# Patient Record
Sex: Male | Born: 1950
Health system: Southern US, Community
[De-identification: ages and names within clinical notes are randomized; demographics above are authoritative.]

## PROBLEM LIST (undated history)

## (undated) ENCOUNTER — Emergency Department (HOSPITAL_COMMUNITY): Payer: PPO | Source: Home / Self Care

## (undated) DIAGNOSIS — R0602 Shortness of breath: Secondary | ICD-10-CM

## (undated) DIAGNOSIS — E119 Type 2 diabetes mellitus without complications: Secondary | ICD-10-CM

## (undated) DIAGNOSIS — J189 Pneumonia, unspecified organism: Secondary | ICD-10-CM

## (undated) DIAGNOSIS — J45909 Unspecified asthma, uncomplicated: Secondary | ICD-10-CM

## (undated) DIAGNOSIS — G4730 Sleep apnea, unspecified: Secondary | ICD-10-CM

## (undated) DIAGNOSIS — M1990 Unspecified osteoarthritis, unspecified site: Secondary | ICD-10-CM

## (undated) DIAGNOSIS — R1310 Dysphagia, unspecified: Secondary | ICD-10-CM

## (undated) DIAGNOSIS — K621 Rectal polyp: Secondary | ICD-10-CM

## (undated) DIAGNOSIS — I10 Essential (primary) hypertension: Secondary | ICD-10-CM

## (undated) DIAGNOSIS — I5189 Other ill-defined heart diseases: Secondary | ICD-10-CM

## (undated) DIAGNOSIS — I2510 Atherosclerotic heart disease of native coronary artery without angina pectoris: Secondary | ICD-10-CM

## (undated) DIAGNOSIS — N400 Enlarged prostate without lower urinary tract symptoms: Secondary | ICD-10-CM

## (undated) DIAGNOSIS — I519 Heart disease, unspecified: Secondary | ICD-10-CM

## (undated) HISTORY — DX: Rectal polyp: K62.1

## (undated) HISTORY — PX: CIRCUMCISION: SUR203

## (undated) HISTORY — PX: LACERATION REPAIR: SHX5168

## (undated) HISTORY — DX: Dysphagia, unspecified: R13.10

## (undated) HISTORY — DX: Atherosclerotic heart disease of native coronary artery without angina pectoris: I25.10

---

## 2001-04-16 ENCOUNTER — Encounter: Payer: Self-pay | Admitting: Internal Medicine

## 2001-04-16 ENCOUNTER — Ambulatory Visit (HOSPITAL_COMMUNITY): Admission: RE | Admit: 2001-04-16 | Discharge: 2001-04-16 | Payer: Self-pay | Admitting: Internal Medicine

## 2001-05-04 ENCOUNTER — Encounter: Payer: Self-pay | Admitting: Internal Medicine

## 2001-05-04 ENCOUNTER — Ambulatory Visit (HOSPITAL_COMMUNITY): Admission: RE | Admit: 2001-05-04 | Discharge: 2001-05-04 | Payer: Self-pay | Admitting: Internal Medicine

## 2001-12-27 ENCOUNTER — Emergency Department (HOSPITAL_COMMUNITY): Admission: EM | Admit: 2001-12-27 | Discharge: 2001-12-27 | Payer: Self-pay | Admitting: Internal Medicine

## 2001-12-27 ENCOUNTER — Encounter: Payer: Self-pay | Admitting: Internal Medicine

## 2004-08-19 ENCOUNTER — Inpatient Hospital Stay (HOSPITAL_COMMUNITY): Admission: AD | Admit: 2004-08-19 | Discharge: 2004-08-23 | Payer: Self-pay | Admitting: Internal Medicine

## 2006-03-13 ENCOUNTER — Ambulatory Visit (HOSPITAL_COMMUNITY): Admission: RE | Admit: 2006-03-13 | Discharge: 2006-03-13 | Payer: Self-pay | Admitting: Internal Medicine

## 2006-04-26 ENCOUNTER — Emergency Department (HOSPITAL_COMMUNITY): Admission: EM | Admit: 2006-04-26 | Discharge: 2006-04-26 | Payer: Self-pay | Admitting: Emergency Medicine

## 2006-05-26 ENCOUNTER — Emergency Department (HOSPITAL_COMMUNITY): Admission: EM | Admit: 2006-05-26 | Discharge: 2006-05-26 | Payer: Self-pay | Admitting: Emergency Medicine

## 2006-06-14 ENCOUNTER — Emergency Department (HOSPITAL_COMMUNITY): Admission: EM | Admit: 2006-06-14 | Discharge: 2006-06-14 | Payer: Self-pay | Admitting: Emergency Medicine

## 2006-09-18 ENCOUNTER — Ambulatory Visit (HOSPITAL_COMMUNITY): Admission: RE | Admit: 2006-09-18 | Discharge: 2006-09-18 | Payer: Self-pay | Admitting: Internal Medicine

## 2006-09-18 ENCOUNTER — Ambulatory Visit: Payer: Self-pay | Admitting: Cardiology

## 2006-09-25 ENCOUNTER — Ambulatory Visit (HOSPITAL_COMMUNITY): Admission: RE | Admit: 2006-09-25 | Discharge: 2006-09-25 | Payer: Self-pay | Admitting: Internal Medicine

## 2006-10-03 ENCOUNTER — Ambulatory Visit: Payer: Self-pay | Admitting: Cardiovascular Disease

## 2008-08-24 ENCOUNTER — Emergency Department (HOSPITAL_COMMUNITY): Admission: EM | Admit: 2008-08-24 | Discharge: 2008-08-24 | Payer: Self-pay | Admitting: Emergency Medicine

## 2010-06-12 ENCOUNTER — Emergency Department (HOSPITAL_COMMUNITY)
Admission: EM | Admit: 2010-06-12 | Discharge: 2010-06-12 | Payer: Self-pay | Source: Home / Self Care | Admitting: Emergency Medicine

## 2010-08-27 ENCOUNTER — Ambulatory Visit (HOSPITAL_COMMUNITY)
Admission: RE | Admit: 2010-08-27 | Discharge: 2010-08-27 | Payer: Self-pay | Source: Home / Self Care | Attending: Internal Medicine | Admitting: Internal Medicine

## 2010-11-12 ENCOUNTER — Encounter: Payer: Self-pay | Admitting: Orthopedic Surgery

## 2010-12-09 ENCOUNTER — Encounter: Payer: Self-pay | Admitting: Orthopedic Surgery

## 2010-12-09 ENCOUNTER — Ambulatory Visit: Payer: Self-pay | Admitting: Orthopedic Surgery

## 2010-12-17 NOTE — Assessment & Plan Note (Signed)
[facility]                       [facility] OFFICE NOTE   NAME:Vincent Ramos                     MRN:          [id number]  DATE:10/03/2006                            DOB:          1950/10/27    Mr. Wages is referred by Dr. Felecia Shelling for an abnormal EKG, dyspnea and  hypertension.   The patient has apparently previously been seen by [facility].   His coronary risk factors include type 2 diabetes, hypertension,  apparent hyperlipidemia, untreated.   The patient occasionally gets some chest tightness.  This is not anginal  sounding.  He says his chest squeezes for a second and then goes away.  It is not always exertional, it is not related to food.   The patient had been on insulin for a short while in the past.  He has  gained quite a bit of weight lately.  He is fairly sedentary.   His dyspnea sounds functional due to deconditioning.   He is a previous smoker, and has not smoked in over 20 years.   REVIEW OF SYSTEMS:  Remarkable for occasional lower extremity edema.  There has been no history of palpitations, syncope.   The patient has been on oral diabetes medicines for about 2 years.   He has no known allergies.   He is currently taking Glucophage 500 b.i.d. and Vaseretic 15/25 b.i.d.   FAMILY HISTORY:  Remarkable for mother dying of heart problems, but at  an advanced age of 58.  Father died at age 94 of unknown causes.   The patient has had previous sutures on his right wrist area at the age  of 6 from an accident.   The patient has been married twice.  He has 2 older daughters and a  young son that lives with his first wife.  He works at [employer] as a Solicitor.  He likes to shoot pool, and is otherwise  fairly sedentary.   PHYSICAL EXAMINATION:  The blood pressure is 148/88, pulse is 70 and  regular.  HEENT:  Normal.  There is no thyromegaly, no lymphadenopathy.  LUNGS:  Mild expiratory  wheezes.  There is an S1, S2, without murmur, rub, gallop, or click.  ABDOMEN:  Benign.  LOWER EXTREMITIES:  Intact pulses, no edema.  Neuro: non-focal  Skin: warm and dry   I reviewed a study from an exercise sestamibi study done at [facility] on September 10, 2004.  It was a low risk study with an EF of  51%, and diaphragmatic attentuation.   I also reviewed an echocardiogram performed September 18, 2006 here at  [facility].  He had good LV function with moderate LVH.   There were no regional wall motion abnormalities, and no significant  valvular heart disease.   The patient's electrocardiogram, to my review, was essentially normal.  There was no evidence of significant LVH on his EKG.  No evidence of  previous infarction or abnormal timing intervals.   IMPRESSION:  A 60 year old.  EKG reviewed and not abnormal.  Echocardiogram confirming the lack of any significant hypertrophic  cardiomyopathy  or regional wall motion abnormality.   The patient clearly has to adjust his lifestyle.  He is very sedentary.  He needs to increase his activity levels.  He will be re-referred to the  dietician here at [facility].  He needs to lose weight again or  I suspect he will be back on insulin.   At some point in the future, it may be worthwhile to add Norvasc or an  angiotensin receptor blocker to his current blood pressure regimen.  Of  more concern, also, is that he has got a classic body habitus for sleep  apnea.  He does snore loudly at night.  I will leave this up to Dr.  Felecia Shelling, but I suspect the patient should be referred for the minimum of  overnight oximetry to document desaturations, and/or formal sleep study.  This may make his daytime blood pressure harder to control.   From a cardiac perspective, he should probably have a followup stress  test in February of 2009.  He is, essentially, an asymptomatic diabetic,  and I would do a Myoview at least  every 3 years on him.  He has just had  an echocardiogram, and, frankly, I do not think he needs any further  cardiac workup at this time.  We had a fairly lengthy discussion about  the need for dietary changes for the long term sequela.   The patient is to follow up with Dr. Felecia Shelling in regards to following his  hemoglobin A1C, and also for his blood pressure checks.  Again, I  suspect he will need medicine such as Norvasc or an angiotensin receptor  blocker started, in addition to his current medications.     Noralyn Pick. Eden Emms, MD, [facility]  Electronically Signed    PCN/MedQ  DD: 10/03/2006  DT: 10/03/2006  Job #: [id number]   cc:   Tesfaye D. Felecia Shelling, MD

## 2010-12-17 NOTE — H&P (Signed)
NAME:  Vincent Ramos, Vincent Ramos              ACCOUNT NO.:  [id number]   MEDICAL RECORD NO.:  [id number]          PATIENT TYPE:  INP   LOCATION:  A220                          FACILITY:  [facility]   PHYSICIAN:  Tesfaye D. Felecia Shelling, MD   DATE OF BIRTH:  24-Mar-1951   DATE OF ADMISSION:  08/19/2004  DATE OF DISCHARGE:  LH                                HISTORY & PHYSICAL   CHIEF COMPLAINT:  1.  Generalized weakness.  2.  Polyuria.  3.  Polydipsia.  4.  Nocturia.  5.  Weight loss.   HISTORY OF PRESENT ILLNESS:  This is a 60 year old male patient with a  history of hypertension.  He came to the office with the above complaints.  The patient has lost over 20 pounds in the last two to three weeks.  He has  also developed polyuria, nocturia and polydipsia.  The patient has  associated generalized weakness.  He was evaluated in the office and his  blood sugar registered greater than 500.  The patient has had no history of  diabetes mellitus in the past.  He was sent for admission.  On his way to  admission, the patient also experienced chest pain in the left side of the  chest which lasted for a few minutes.  The patient had a similar chest pain  about a week back.  There was no nausea, vomiting or diaphoresis.   REVIEW OF SYSTEMS:  The patient has no fever, chills, cough, shortness of  breath, palpitations, abdominal pain, nausea, vomiting or diarrhea.  No  dysuria, urgency or frequency of urination.   PAST MEDICAL HISTORY:  1.  Hypertension.  2.  Obesity.   CURRENT MEDICATIONS:  Lisinopril 20 mg p.o. daily.   SOCIAL HISTORY:  The patient is married.  He is full-time employed.  No  history of alcohol or tobacco.  He drinks alcohol on a social basis.   FAMILY HISTORY:  The patient's sister is a diabetic.  His father has died.  The cause of his death is not known.  His mother is alive and she has no  diabetes.   PHYSICAL EXAMINATION:  GENERAL APPEARANCE:  The patient is alert, awake and  comfortable.  VITAL SIGNS:  Blood pressure 136/70, pulse 81, respiratory rate 20,  temperature 98.4.  WEIGHT:  228 pounds.  HEENT:  Pupils are equal and reactive.  NECK:  Supple.  CHEST:  Fair air entry.  Clear lung fields.  CARDIOVASCULAR:  First and second heart sounds heard.  No murmur.  No  gallop.  ABDOMEN:  Obese, soft and lax.  Bowel sounds are positive.  No mass.  No  organomegaly.  EXTREMITIES:  No leg edema.   LABORATORIES:  Currently pending.   ASSESSMENT:  1.  New onset diabetes mellitus.  2.  Chest pain, rule out myocardial ischemia.  3.  Hypertension.  4.  Obesity.   PLAN:  1.  Will start the patient on Accu-Chek and sliding scale coverage.  2.  Would continue on IV fluids.  3.  Will do serial EKGs and cardiac enzymes.  4.  Will  put the patient on prophylactic Lovenox 40 mg subcutaneously q.24h.  5.  Will do cardiology consult.  6.  Will do diabetic education and continue him on his antihypertensive      medication.      TDF/MEDQ  D:  08/19/2004  T:  08/19/2004  Job:  [id number]

## 2010-12-17 NOTE — Discharge Summary (Signed)
NAME:  Vincent Ramos, Vincent Ramos              ACCOUNT NO.:  [id number]   MEDICAL RECORD NO.:  [id number]          PATIENT TYPE:  INP   LOCATION:  A220                          FACILITY:  [facility]   PHYSICIAN:  Tesfaye D. Felecia Shelling, MD   DATE OF BIRTH:  Oct 03, 1950   DATE OF ADMISSION:  08/19/2004  DATE OF DISCHARGE:  01/23/2006LH                                 DISCHARGE SUMMARY   DISCHARGE DIAGNOSES:  1.  New onset diabetes mellitus.  2.  Noncardiac chest pain.  3.  Hypertension.  4.  Morbid obesity.   DISCHARGE MEDICATIONS:  1.  Glucophage 1000 mg p.o. b.i.d.  2.  Actos 30 mg p.o. q.d.  3.  Lantus insulin 20 units subcu q.h.s.  4.  Lisinopril 20 mg p.o. q.d.  5.  Aspirin 1 tablet daily.   DISPOSITION:  The patient was discharged to home in stable condition.   HOSPITAL COURSE:  This is a 60 year old male patient with a history of  hypertension and obesity, came into the office due to generalized weakness,  polyuria, polydipsia and polyphagia. The patient's blood sugar in the office  was found to be over 500 mg/dl. He was then directly admitted. During the  admission, the patient complained of chest tightness. He was put under  telemetry and serial EKG and cardiac enzymes were done. He was also  evaluated by cardiology. There was no sign of acute coronary ischemia. The  patient's blood sugar was controlled. He was educated about diabetes. The  patient was discharged to home in stable condition to continue his treatment  and follow-up in outpatient.      TDF/MEDQ  D:  09/16/2004  T:  09/16/2004  Job:  [id number]

## 2010-12-17 NOTE — Procedures (Signed)
Vincent Ramos, Vincent Ramos              ACCOUNT NO.:  [id number]   MEDICAL RECORD NO.:  [id number]          PATIENT TYPE:  OUT   LOCATION:  RAD                           FACILITY:  [facility]   PHYSICIAN:  Gerrit Friends. Dietrich Pates, MD, FACCDATE OF BIRTH:  05/03/1951   DATE OF PROCEDURE:  09/18/2006  DATE OF DISCHARGE:                                ECHOCARDIOGRAM   REFERRING PHYSICIAN:  Tesfaye D. Felecia Shelling, MD   CLINICAL DATA:  A 61 year old gentleman with dyspnea, hypertension and  diabetes.   M-mode:  Aorta 3.9, left atrium 3.3, septum 2.0, posterior wall 1.6, LV  diastole 4.6, LV systole 3.5.   1. Technically adequate echocardiographic study.  2. Normal left atrium, right atrium and right ventricle; mild RVH.  3. Mild dilatation of the proximal ascending aorta; mild calcification      of the wall.  4. Normal aortic valve.  5. Normal mitral valve; mild annular calcification.  6. Tricuspid valve not well seen, but appears grossly normal.  7. Normal pulmonic valve and proximal pulmonary artery.  8. Normal left ventricular size; moderate hypertrophy; normal regional      and global function.  9. IVC poorly visualized, but appears grossly normal.      Gerrit Friends. Dietrich Pates, MD, [facility]  Electronically Signed     RMR/MEDQ  D:  09/19/2006  T:  09/19/2006  Job:  [id number]

## 2011-02-16 ENCOUNTER — Other Ambulatory Visit (HOSPITAL_COMMUNITY): Payer: Self-pay | Admitting: Internal Medicine

## 2011-02-18 ENCOUNTER — Ambulatory Visit (HOSPITAL_COMMUNITY)
Admission: RE | Admit: 2011-02-18 | Discharge: 2011-02-18 | Disposition: A | Discharge status: Home / Self Care | Payer: 59 | Source: Ambulatory Visit | Attending: Internal Medicine | Admitting: Internal Medicine

## 2011-02-18 ENCOUNTER — Other Ambulatory Visit (HOSPITAL_COMMUNITY): Payer: Self-pay | Admitting: Internal Medicine

## 2011-02-18 DIAGNOSIS — N5089 Other specified disorders of the male genital organs: Secondary | ICD-10-CM | POA: Insufficient documentation

## 2011-02-18 DIAGNOSIS — N509 Disorder of male genital organs, unspecified: Secondary | ICD-10-CM | POA: Insufficient documentation

## 2011-02-18 DIAGNOSIS — N433 Hydrocele, unspecified: Secondary | ICD-10-CM | POA: Insufficient documentation

## 2011-08-02 ENCOUNTER — Emergency Department (HOSPITAL_COMMUNITY)
Admission: EM | Admit: 2011-08-02 | Discharge: 2011-08-02 | Disposition: A | Discharge status: Home / Self Care | Payer: 59 | Attending: Emergency Medicine | Admitting: Emergency Medicine

## 2011-08-02 DIAGNOSIS — Z794 Long term (current) use of insulin: Secondary | ICD-10-CM | POA: Insufficient documentation

## 2011-08-02 DIAGNOSIS — M545 Low back pain, unspecified: Secondary | ICD-10-CM | POA: Insufficient documentation

## 2011-08-02 DIAGNOSIS — M5430 Sciatica, unspecified side: Secondary | ICD-10-CM | POA: Insufficient documentation

## 2011-08-02 DIAGNOSIS — Z79899 Other long term (current) drug therapy: Secondary | ICD-10-CM | POA: Insufficient documentation

## 2011-08-02 DIAGNOSIS — E119 Type 2 diabetes mellitus without complications: Secondary | ICD-10-CM | POA: Insufficient documentation

## 2011-08-02 DIAGNOSIS — M79609 Pain in unspecified limb: Secondary | ICD-10-CM | POA: Insufficient documentation

## 2011-08-02 DIAGNOSIS — I10 Essential (primary) hypertension: Secondary | ICD-10-CM | POA: Insufficient documentation

## 2011-08-02 DIAGNOSIS — R209 Unspecified disturbances of skin sensation: Secondary | ICD-10-CM | POA: Insufficient documentation

## 2011-08-02 HISTORY — DX: Essential (primary) hypertension: I10

## 2011-08-02 MED ORDER — CYCLOBENZAPRINE HCL 10 MG PO TABS
10.0000 mg | ORAL_TABLET | Freq: Once | ORAL | Status: AC
  Administered 2011-08-02: 10 mg via ORAL
  Filled 2011-08-02: qty 1

## 2011-08-02 MED ORDER — METHOCARBAMOL 500 MG PO TABS: ORAL_TABLET | ORAL | Status: DC

## 2011-08-02 MED ORDER — HYDROMORPHONE HCL PF 1 MG/ML IJ SOLN
1.0000 mg | Freq: Once | INTRAMUSCULAR | Status: AC
  Administered 2011-08-02: 1 mg via INTRAMUSCULAR
  Filled 2011-08-02: qty 1

## 2011-08-02 MED ORDER — NAPROXEN 500 MG PO TABS: 500.0000 mg | ORAL_TABLET | Freq: Two times a day (BID) | ORAL | Status: DC

## 2011-08-02 MED ORDER — OXYCODONE-ACETAMINOPHEN 5-325 MG PO TABS: ORAL_TABLET | ORAL | Status: DC

## 2011-08-02 NOTE — ED Notes (Signed)
C/o rt hip pain that radiates to rt big toe per pt.  Ambulated to triage by self.

## 2011-08-02 NOTE — ED Provider Notes (Signed)
History     CSN: [id number]  Arrival date & time 08/02/11  1053   First MD Initiated Contact with Patient 08/02/11 1328      Chief Complaint  Patient presents with  . Leg Pain    (Consider location/radiation/quality/duration/timing/severity/associated sxs/prior treatment) HPI Comments: Patient c/o right sided low back pain for 2-3 days.  States the pain is sharp and stinging and radiates to his right leg and into the great toe.  Intermittent tingling sensation but denies numbness, weakness, or incontinence of urine or feces.  Patient is a 61 y.o. male presenting with back pain. The history is provided by the patient.  Back Pain  This is a recurrent problem. The current episode started 2 days ago. The problem occurs constantly. The problem has not changed since onset.The pain is associated with lifting heavy objects and twisting. The pain is present in the lumbar spine and sacro-iliac joint. The quality of the pain is described as shooting and aching. The pain radiates to the right thigh and right foot. The pain is moderate. The symptoms are aggravated by bending, twisting and certain positions. The pain is the same all the time. Associated symptoms include leg pain and tingling. Pertinent negatives include no fever, no numbness, no headaches, no abdominal pain, no abdominal swelling, no bowel incontinence, no perianal numbness, no bladder incontinence, no dysuria, no pelvic pain, no paresthesias, no paresis and no weakness. He has tried nothing for the symptoms. The treatment provided no relief.    Past Medical History  Diagnosis Date  . Diabetes mellitus   . Hypertension     History reviewed. No pertinent past surgical history.  No family history on file.  History  Substance Use Topics  . Smoking status: Never Smoker   . Smokeless tobacco: Not on file  . Alcohol Use: Yes      Review of Systems  Constitutional: Negative for fever.  HENT: Negative for neck pain and neck  stiffness.   Gastrointestinal: Negative for abdominal pain and bowel incontinence.  Genitourinary: Negative for bladder incontinence, dysuria, decreased urine volume, difficulty urinating and pelvic pain.  Musculoskeletal: Positive for back pain. Negative for joint swelling and gait problem.  Skin: Negative.   Neurological: Positive for tingling. Negative for weakness, numbness, headaches and paresthesias.  All other systems reviewed and are negative.    Allergies  Review of patient's allergies indicates no known allergies.  Home Medications   Current Outpatient Rx  Name Route Sig Dispense Refill  . AMLODIPINE BESYLATE 5 MG PO TABS Oral Take 5 mg by mouth daily.      . ENALAPRIL-HYDROCHLOROTHIAZIDE 10-25 MG PO TABS Oral Take 1 tablet by mouth daily.      . INSULIN GLARGINE 100 UNIT/ML [place] SOLN Subcutaneous Inject 20 Units into the skin 2 (two) times daily.      Marland Kitchen METFORMIN HCL ER (MOD) 1000 MG PO TB24 Oral Take 1,000 mg by mouth 2 (two) times daily with a meal.        BP 179/90  Pulse 87  Temp(Src) 98.7 F (37.1 C) (Oral)  Resp 20  Ht 5\' 6"  (1.676 m)  Wt 255 lb (115.667 kg)  BMI 41.16 kg/m2  SpO2 97%  Physical Exam  Nursing note and vitals reviewed. Constitutional: He is oriented to person, place, and time. He appears well-developed and well-nourished. No distress.  HENT:  Head: Normocephalic.  Neck: Normal range of motion. Neck supple.  Cardiovascular: Normal rate, regular rhythm and normal heart sounds.  Pulmonary/Chest: Effort normal and breath sounds normal.  Musculoskeletal: He exhibits tenderness. He exhibits no edema.       Lumbar back: He exhibits tenderness and pain. He exhibits normal range of motion, no bony tenderness, no swelling, no edema, no deformity, no laceration and normal pulse.       Back:  Lymphadenopathy:    He has no cervical adenopathy.  Neurological: He is alert and oriented to person, place, and time. He has normal strength. No cranial nerve  deficit or sensory deficit. Coordination normal.  Reflex Scores:      Patellar reflexes are 2+ on the right side and 2+ on the left side.      Achilles reflexes are 2+ on the right side and 2+ on the left side. Skin: Skin is warm and dry.    ED Course  Procedures (including critical care time)       MDM    ttp of the right SI joint .  No focal neuro deficits on exam.  Ambulates w/o difficulty  Patient / Family / Caregiver understand and agree with initial ED impression and plan with expectations set for ED visit.   Pt feels improved after observation and/or treatment in ED.         Severina Sykora L. Avari Nevares, [place] 08/04/11 1309

## 2011-08-05 NOTE — ED Provider Notes (Signed)
Medical screening examination/treatment/procedure(s) were performed by non-physician practitioner and as supervising physician I was immediately available for consultation/collaboration.   Geoffery Lyons, MD 08/05/11 [phone]

## 2011-08-23 ENCOUNTER — Other Ambulatory Visit (HOSPITAL_COMMUNITY): Payer: Self-pay | Admitting: Internal Medicine

## 2011-08-23 DIAGNOSIS — M549 Dorsalgia, unspecified: Secondary | ICD-10-CM

## 2011-08-26 ENCOUNTER — Ambulatory Visit (HOSPITAL_COMMUNITY): Payer: 59

## 2011-09-12 ENCOUNTER — Ambulatory Visit (HOSPITAL_COMMUNITY): Payer: 59

## 2011-09-16 ENCOUNTER — Ambulatory Visit (INDEPENDENT_AMBULATORY_CARE_PROVIDER_SITE_OTHER): Payer: 59 | Admitting: Urology

## 2011-09-16 DIAGNOSIS — N433 Hydrocele, unspecified: Secondary | ICD-10-CM

## 2011-09-19 ENCOUNTER — Other Ambulatory Visit: Payer: Self-pay | Admitting: Urology

## 2011-10-10 ENCOUNTER — Encounter (HOSPITAL_COMMUNITY): Payer: Self-pay | Admitting: Pharmacy Technician

## 2011-10-10 ENCOUNTER — Other Ambulatory Visit: Payer: Self-pay

## 2011-10-10 ENCOUNTER — Encounter (HOSPITAL_COMMUNITY)
Admission: RE | Admit: 2011-10-10 | Discharge: 2011-10-10 | Disposition: A | Discharge status: Home / Self Care | Payer: 59 | Source: Ambulatory Visit | Attending: Urology | Admitting: Urology

## 2011-10-10 ENCOUNTER — Encounter (HOSPITAL_COMMUNITY): Payer: Self-pay

## 2011-10-10 HISTORY — DX: Shortness of breath: R06.02

## 2011-10-10 LAB — BASIC METABOLIC PANEL
BUN: 14 mg/dL (ref 6–23)
Calcium: 9.8 mg/dL (ref 8.4–10.5)
GFR calc non Af Amer: 75 mL/min — ABNORMAL LOW (ref >90)
Glucose, Bld: 119 mg/dL — ABNORMAL HIGH (ref 70–99)

## 2011-10-10 NOTE — Patient Instructions (Addendum)
20 Vincent Ramos  10/10/2011   Your procedure is scheduled on:   10/14/2011  Report to [facility] at  1030 am   Call this number if you have problems the morning of surgery: [phone]   Remember:   Do not eat food:After Midnight.  May have clear liquids:until Midnight .  Clear liquids include soda, tea, black coffee, apple or grape juice, broth.  Take these medicines the morning of surgery with A SIP OF WATER: norvasc,vaseretic,robaxcin,percocet   Do not wear jewelry, make-up or nail polish.  Do not wear lotions, powders, or perfumes. You may wear deodorant.  Do not shave 48 hours prior to surgery.  Do not bring valuables to the hospital.  Contacts, dentures or bridgework may not be worn into surgery.  Leave suitcase in the car. After surgery it may be brought to your room.  For patients admitted to the hospital, checkout time is 11:00 AM the day of discharge.   Patients discharged the day of surgery will not be allowed to drive home.  Name and phone number of your driver: family  Special Instructions: CHG Shower Use Special Wash: 1/2 bottle night before surgery and 1/2 bottle morning of surgery.   Please read over the following fact sheets that you were given: Pain Booklet, MRSA Information, Surgical Site Infection Prevention, Anesthesia Post-op Instructions and Care and Recovery After Surgery Hydrocele, Adult Fluid can collect around the testicles. This fluid forms in a sac. This condition is called a hydrocele. The collected fluid causes swelling of the scrotum. Usually, it affects just one testicle. Most of the time, the condition does not cause pain. Sometimes, the hydrocele goes away on its own. Other times, surgery is needed to get rid of the fluid. CAUSES A hydrocele does not develop often. Different things can cause a hydrocele in a man, including:  Injury to the scrotum.   Infection.   X-ray of the area around the scrotum.   A tumor or cancer of the testicle.    Twisting of a testicle.   Decreased blood flow to the scrotum.  SYMPTOMS   Swelling without pain. The hydrocele feels like a water-filled balloon.   Swelling with pain. This can occur if the hydrocele was caused by infection or twisting.   Mild discomfort in the scrotum.   The hydrocele may feel heavy.   Swelling that gets smaller when you lie down.  DIAGNOSIS  Your caregiver will do a physical exam to decide if you have a hydrocele. This may include:  Asking questions about your overall health, today and in the past. Your caregiver may ask about any injuries, X-rays, or infections.   Pushing on your abdomen or asking you to change positions to see if the size of the hydrocele changes.   Shining a light through the scrotum (transillumination) to see if the fluid inside the scrotum is clear.   Blood tests and urine tests to check for infection.   Imaging studies that take pictures of the scrotum and testicles.  TREATMENT  Treatment depends in part on what caused the condition. Options include:  Watchful waiting. Your caregiver checks the hydrocele every so often.   Different surgeries to drain the fluid.   A needle may be put into the scrotum to drain fluid (needle aspiration). Fluid often returns after this type of treatment.   A cut (incision) may be made in the scrotum to remove the fluid sac (hydrocelectomy).   An incision may be made in the  groin to repair a hydrocele that has contact with abdominal fluids (communicating hydrocele).   Medicines to treat an infection (antibiotics).  HOME CARE INSTRUCTIONS  What you need to do at home may depend on the cause of the hydrocele and type of treatment. In general:  Take all medicine as directed by your caregiver. Follow the directions carefully.   Ask your caregiver if there is anything you should not do while you recover (activities, lifting, work, sex).   If you had surgery to repair a communicating hydrocele,  recovery time may vary. Ask you caregiver about your recovery time.   Avoid heavy lifting for 4 to 6 weeks.   If you had an incision on the scrotum or groin, wash it for 2 to 3 days after surgery. Do this as long as the skin is closed and there are no gaps in the wound. Wash gently, and avoid rubbing the incision.   Keep all follow-up appointments.  SEEK MEDICAL CARE IF:   Your scrotum seems to be getting larger.   The area becomes more and more uncomfortable.  SEEK IMMEDIATE MEDICAL CARE IF:  You have a fever. Document Released: 01/05/2010 Document Revised: 07/07/2011 Document Reviewed: 01/05/2010 [facility] Patient Information 2012 [place], [place].PATIENT INSTRUCTIONS POST-ANESTHESIA  IMMEDIATELY FOLLOWING SURGERY:  Do not drive or operate machinery for the first twenty four hours after surgery.  Do not make any important decisions for twenty four hours after surgery or while taking narcotic pain medications or sedatives.  If you develop intractable nausea and vomiting or a severe headache please notify your doctor immediately.  FOLLOW-UP:  Please make an appointment with your surgeon as instructed. You do not need to follow up with anesthesia unless specifically instructed to do so.  WOUND CARE INSTRUCTIONS (if applicable):  Keep a dry clean dressing on the anesthesia/puncture wound site if there is drainage.  Once the wound has quit draining you may leave it open to air.  Generally you should leave the bandage intact for twenty four hours unless there is drainage.  If the epidural site drains for more than 36-48 hours please call the anesthesia department.  QUESTIONS?:  Please feel free to call your physician or the hospital operator if you have any questions, and they will be happy to assist you.     [facility] Anesthesia Department [address] [place] [phone]

## 2011-10-10 NOTE — Progress Notes (Signed)
10/10/11 1105  OBSTRUCTIVE SLEEP APNEA  Have you ever been diagnosed with sleep apnea through a sleep study? No  Do you snore loudly (loud enough to be heard through closed doors)?  1  Do you often feel tired, fatigued, or sleepy during the daytime? 0  Has anyone observed you stop breathing during your sleep? 0  Do you have, or are you being treated for high blood pressure? 1  BMI more than 35 kg/m2? 1  Age over 59 years old? 1  Neck circumference greater than 40 cm/18 inches? 1  Gender: 1  Obstructive Sleep Apnea Score 6   Score 4 or greater  Updated health history

## 2011-10-13 NOTE — Discharge Instructions (Addendum)
Hydrocele, Adult Fluid can collect around the testicles. This fluid forms in a sac. This condition is called a hydrocele. The collected fluid causes swelling of the scrotum. Usually, it affects just one testicle. Most of the time, the condition does not cause pain. Sometimes, the hydrocele goes away on its own. Other times, surgery is needed to get rid of the fluid. CAUSES A hydrocele does not develop often. Different things can cause a hydrocele in a man, including:  Injury to the scrotum.   Infection.   X-ray of the area around the scrotum.   A tumor or cancer of the testicle.   Twisting of a testicle.   Decreased blood flow to the scrotum.  SYMPTOMS   Swelling without pain. The hydrocele feels like a water-filled balloon.   Swelling with pain. This can occur if the hydrocele was caused by infection or twisting.   Mild discomfort in the scrotum.   The hydrocele may feel heavy.   Swelling that gets smaller when you lie down.  DIAGNOSIS  Your caregiver will do a physical exam to decide if you have a hydrocele. This may include:  Asking questions about your overall health, today and in the past. Your caregiver may ask about any injuries, X-rays, or infections.   Pushing on your abdomen or asking you to change positions to see if the size of the hydrocele changes.   Shining a light through the scrotum (transillumination) to see if the fluid inside the scrotum is clear.   Blood tests and urine tests to check for infection.   Imaging studies that take pictures of the scrotum and testicles.  TREATMENT  Treatment depends in part on what caused the condition. Options include:  Watchful waiting. Your caregiver checks the hydrocele every so often.   Different surgeries to drain the fluid.   A needle may be put into the scrotum to drain fluid (needle aspiration). Fluid often returns after this type of treatment.   A cut (incision) may be made in the scrotum to remove the fluid  sac (hydrocelectomy).   An incision may be made in the groin to repair a hydrocele that has contact with abdominal fluids (communicating hydrocele).   Medicines to treat an infection (antibiotics).  HOME CARE INSTRUCTIONS  What you need to do at home may depend on the cause of the hydrocele and type of treatment. In general:  Take all medicine as directed by your caregiver. Follow the directions carefully.   Ask your caregiver if there is anything you should not do while you recover (activities, lifting, work, sex).   If you had surgery to repair a communicating hydrocele, recovery time may vary. Ask you caregiver about your recovery time.   Avoid heavy lifting for 4 to 6 weeks.   If you had an incision on the scrotum or groin, wash it for 2 to 3 days after surgery. Do this as long as the skin is closed and there are no gaps in the wound. Wash gently, and avoid rubbing the incision.   Keep all follow-up appointments.  SEEK MEDICAL CARE IF:   Your scrotum seems to be getting larger.   The area becomes more and more uncomfortable.  SEEK IMMEDIATE MEDICAL CARE IF:  You have a fever. Document Released: 01/05/2010 Document Revised: 07/07/2011 Document Reviewed: 01/05/2010 [facility] Patient Information 2012 [place], [place].   Please remove the wound drain in the morning.  It should just slip out and is about 6 inches long.  If it  doesn't come easily, don't force it and let us know so we can bring you in to remove it.  PATIENT INSTRUCTIONS POST-ANESTHESIA  IMMEDIATELY FOLLOWING SURGERY:  Do not drive or operate machinery for the first twenty four hours after surgery.  Do not make any important decisions for twenty four hours after surgery or while taking narcotic pain medications or sedatives.  If you develop intractable nausea and vomiting or a severe headache please notify your doctor immediately.  FOLLOW-UP:  Please make an appointment with your surgeon as instructed. You do not need  to follow up with anesthesia unless specifically instructed to do so.  WOUND CARE INSTRUCTIONS (if applicable):  Keep a dry clean dressing on the anesthesia/puncture wound site if there is drainage.  Once the wound has quit draining you may leave it open to air.  Generally you should leave the bandage intact for twenty four hours unless there is drainage.  If the epidural site drains for more than 36-48 hours please call the anesthesia department.  QUESTIONS?:  Please feel free to call your physician or the hospital operator if you have any questions, and they will be happy to assist you.     [facility] Anesthesia Department [address] [place] [phone]

## 2011-10-13 NOTE — H&P (Signed)
Problems  1. Testicular Hydrocele Right 603.9  History of Present Illness  Vincent Ramos is a 61 yo BM sent in consult by Dr. Felecia Shelling for right scrotal swelling that has been present for over a year.  He has some pain with movement and has gotten increasely larger and is getting in the way.   He has a hard time voiding with the swelling.   He otherwise voids without difficulty.  I also makes sex difficult.   Past Medical History Problems  1. History of  Diabetes Mellitus 250.00 2. History of  Hypertension 401.9  Surgical History Problems  1. History of  Hand Surgery Left  Current Meds 1. Aspirin 81 MG Oral Tablet Chewable; Therapy: (Recorded:15Feb2013) to 2. Lisinopril-Hydrochlorothiazide TABS; Therapy: (Recorded:15Feb2013) to 3. MetFORMIN HCl TABS; Therapy: (Recorded:15Feb2013) to 4. Multiple Vitamin TABS; Therapy: (Recorded:15Feb2013) to  Allergies Medication  1. No Known Drug Allergies  Family History Problems  1. Family history of  Death In The Family Mother 2. Family history of  Family Health Status Number Of Children  Social History Problems    Alcohol Use   Caffeine Use   Marital History - Currently Married   Never A Smoker   Occupation:  Review of Systems Genitourinary, constitutional, skin, eye, otolaryngeal, hematologic/lymphatic, cardiovascular, pulmonary, endocrine, musculoskeletal, gastrointestinal, neurological and psychiatric system(s) were reviewed and pertinent findings if present are noted.  Genitourinary: scrotal swelling.    Vitals Vital Signs [Data Includes: Last 1 Day]  15Feb2013 01:47PM  Blood Pressure: 182 / 94 15Feb2013 01:43PM  BMI Calculated: 40.18 BSA Calculated: 2.2 Height: 5 ft 6 in Weight: 250 lb  Blood Pressure: 189 / 93 Temperature: 99.3 F Heart Rate: 89  Physical Exam Constitutional: Well nourished and well developed . No acute distress.  ENT:. The ears and nose are normal in appearance.  Neck: The appearance of the neck is  normal and no neck mass is present.  Pulmonary: No respiratory distress and normal respiratory rhythm and effort.  Cardiovascular: Heart rate and rhythm are normal . No peripheral edema.  Abdomen: The abdomen is obese. The abdomen is soft and nontender. No masses are palpated. No CVA tenderness. No hernias are palpable. No hepatosplenomegaly noted.  Genitourinary: Examination of the penis demonstrates no discharge, no masses, no lesions and a normal meatus. The scrotum is without lesions. Examination of the right scrotum demonstrates a hydrocele. The right epididymis is palpably normal and non-tender. The left epididymis is palpably normal and non-tender. The right testis is non-tender and without masses. The left testis is non-tender and without masses.  Lymphatics: The femoral and inguinal nodes are not enlarged or tender.  Skin: Normal skin turgor, no visible rash and no visible skin lesions.  Neuro/Psych:. Mood and affect are appropriate.    Results/Data Urine [Data Includes: Last 1 Day]   15Feb2013  COLOR YELLOW   APPEARANCE CLEAR   SPECIFIC GRAVITY 1.020   pH 6.0   GLUCOSE NEG mg/dL  BILIRUBIN NEG   KETONE NEG mg/dL  BLOOD TRACE   PROTEIN TRACE mg/dL  UROBILINOGEN 0.2 mg/dL  NITRITE NEG   LEUKOCYTE ESTERASE NEG   SQUAMOUS EPITHELIAL/HPF NONE SEEN   WBC NONE SEEN WBC/hpf  RBC 0-2 RBC/hpf  BACTERIA NONE SEEN   CRYSTALS NONE SEEN   CASTS NONE SEEN    Assessment Assessed  1. Testicular Hydrocele Right 603.9   He has a symptomatic right hydrocele and needs repair.   Plan Testicular Hydrocele (603.9)  1. Follow-up Schedule Surgery Office  Follow-up  Requested for: 15Feb2013 Unlinked  2. UA With REFLEX  Done: 15Feb2013 01:43PM   I am going to schedule him for a right hydrocelectomy.  I have reviewed the risks of bleeding, infection, recurrence, testicular injury, thrombotic events and anesthetic complications.

## 2011-10-14 ENCOUNTER — Encounter (HOSPITAL_COMMUNITY)
Admission: RE | Disposition: A | Discharge status: Home / Self Care | Payer: Self-pay | Source: Ambulatory Visit | Attending: Urology

## 2011-10-14 ENCOUNTER — Ambulatory Visit (HOSPITAL_COMMUNITY)
Admission: RE | Admit: 2011-10-14 | Discharge: 2011-10-14 | Disposition: A | Discharge status: Home / Self Care | Payer: 59 | Source: Ambulatory Visit | Attending: Urology | Admitting: Urology

## 2011-10-14 ENCOUNTER — Encounter (HOSPITAL_COMMUNITY): Payer: Self-pay | Admitting: Anesthesiology

## 2011-10-14 ENCOUNTER — Encounter (HOSPITAL_COMMUNITY): Payer: Self-pay | Admitting: *Deleted

## 2011-10-14 ENCOUNTER — Ambulatory Visit (HOSPITAL_COMMUNITY): Payer: 59 | Admitting: Anesthesiology

## 2011-10-14 DIAGNOSIS — E119 Type 2 diabetes mellitus without complications: Secondary | ICD-10-CM | POA: Insufficient documentation

## 2011-10-14 DIAGNOSIS — Z7982 Long term (current) use of aspirin: Secondary | ICD-10-CM | POA: Insufficient documentation

## 2011-10-14 DIAGNOSIS — Z01812 Encounter for preprocedural laboratory examination: Secondary | ICD-10-CM | POA: Insufficient documentation

## 2011-10-14 DIAGNOSIS — Z79899 Other long term (current) drug therapy: Secondary | ICD-10-CM | POA: Insufficient documentation

## 2011-10-14 DIAGNOSIS — I10 Essential (primary) hypertension: Secondary | ICD-10-CM | POA: Insufficient documentation

## 2011-10-14 DIAGNOSIS — N433 Hydrocele, unspecified: Secondary | ICD-10-CM | POA: Insufficient documentation

## 2011-10-14 DIAGNOSIS — Z01810 Encounter for preprocedural cardiovascular examination: Secondary | ICD-10-CM | POA: Insufficient documentation

## 2011-10-14 HISTORY — PX: HYDROCELE EXCISION: SHX482

## 2011-10-14 LAB — GLUCOSE, CAPILLARY: Glucose-Capillary: 129 mg/dL — ABNORMAL HIGH (ref 70–99)

## 2011-10-14 SURGERY — HYDROCELECTOMY
Anesthesia: Spinal | Site: Scrotum | Laterality: Right | Wound class: Clean

## 2011-10-14 MED ORDER — PROPOFOL 10 MG/ML IV EMUL
INTRAVENOUS | Status: AC
  Filled 2011-10-14: qty 20

## 2011-10-14 MED ORDER — FENTANYL CITRATE 0.05 MG/ML IJ SOLN
INTRAMUSCULAR | Status: DC | PRN
  Administered 2011-10-14: 25 ug via INTRATHECAL

## 2011-10-14 MED ORDER — EPHEDRINE SULFATE 50 MG/ML IJ SOLN
INTRAMUSCULAR | Status: AC
  Filled 2011-10-14: qty 1

## 2011-10-14 MED ORDER — HYDROCODONE-ACETAMINOPHEN 5-325 MG PO TABS: 1.0000 | ORAL_TABLET | Freq: Four times a day (QID) | ORAL | Status: AC | PRN

## 2011-10-14 MED ORDER — GLYCOPYRROLATE 0.2 MG/ML IJ SOLN
INTRAMUSCULAR | Status: AC
  Filled 2011-10-14: qty 1

## 2011-10-14 MED ORDER — BUPIVACAINE IN DEXTROSE 0.75-8.25 % IT SOLN
INTRATHECAL | Status: DC | PRN
  Administered 2011-10-14: 13 mg via INTRATHECAL

## 2011-10-14 MED ORDER — ONDANSETRON HCL 4 MG/2ML IJ SOLN: 4.0000 mg | Freq: Once | INTRAMUSCULAR | Status: DC | PRN

## 2011-10-14 MED ORDER — FENTANYL CITRATE 0.05 MG/ML IJ SOLN
INTRAMUSCULAR | Status: AC
  Filled 2011-10-14: qty 2

## 2011-10-14 MED ORDER — CEFAZOLIN SODIUM 1-5 GM-% IV SOLN
INTRAVENOUS | Status: AC
  Filled 2011-10-14: qty 50

## 2011-10-14 MED ORDER — LACTATED RINGERS IV SOLN
INTRAVENOUS | Status: DC
  Administered 2011-10-14: 12:00:00 via INTRAVENOUS

## 2011-10-14 MED ORDER — PROPOFOL 10 MG/ML IV EMUL
INTRAVENOUS | Status: DC | PRN
  Administered 2011-10-14: 30 ug/kg/min via INTRAVENOUS

## 2011-10-14 MED ORDER — ONDANSETRON HCL 4 MG/2ML IJ SOLN
INTRAMUSCULAR | Status: AC
  Filled 2011-10-14: qty 2

## 2011-10-14 MED ORDER — SODIUM CHLORIDE 0.9 % IR SOLN
Status: DC | PRN
  Administered 2011-10-14: 1000 mL

## 2011-10-14 MED ORDER — CEFAZOLIN SODIUM 1-5 GM-% IV SOLN: 1.0000 g | INTRAVENOUS | Status: DC

## 2011-10-14 MED ORDER — EPHEDRINE SULFATE 50 MG/ML IJ SOLN
INTRAMUSCULAR | Status: DC | PRN
  Administered 2011-10-14: 15 mg via INTRAVENOUS
  Administered 2011-10-14: 10 mg via INTRAVENOUS

## 2011-10-14 MED ORDER — FENTANYL CITRATE 0.05 MG/ML IJ SOLN: 25.0000 ug | INTRAMUSCULAR | Status: DC | PRN

## 2011-10-14 MED ORDER — BUPIVACAINE HCL (PF) 0.25 % IJ SOLN
INTRAMUSCULAR | Status: DC | PRN
  Administered 2011-10-14: 9 mL

## 2011-10-14 MED ORDER — BUPIVACAINE HCL (PF) 0.25 % IJ SOLN
INTRAMUSCULAR | Status: AC
  Filled 2011-10-14: qty 30

## 2011-10-14 MED ORDER — ACETAMINOPHEN 325 MG PO TABS: 325.0000 mg | ORAL_TABLET | ORAL | Status: DC | PRN

## 2011-10-14 MED ORDER — ONDANSETRON HCL 4 MG/2ML IJ SOLN
4.0000 mg | Freq: Once | INTRAMUSCULAR | Status: AC
  Administered 2011-10-14: 4 mg via INTRAVENOUS

## 2011-10-14 MED ORDER — GLYCOPYRROLATE 0.2 MG/ML IJ SOLN
0.2000 mg | Freq: Once | INTRAMUSCULAR | Status: AC
  Administered 2011-10-14: 0.2 mg via INTRAVENOUS

## 2011-10-14 MED ORDER — FENTANYL CITRATE 0.05 MG/ML IJ SOLN
INTRAMUSCULAR | Status: DC | PRN
  Administered 2011-10-14: 50 ug via INTRAVENOUS

## 2011-10-14 MED ORDER — MIDAZOLAM HCL 2 MG/2ML IJ SOLN
INTRAMUSCULAR | Status: AC
  Filled 2011-10-14: qty 2

## 2011-10-14 MED ORDER — MIDAZOLAM HCL 2 MG/2ML IJ SOLN
1.0000 mg | INTRAMUSCULAR | Status: DC | PRN
  Administered 2011-10-14: 2 mg via INTRAVENOUS

## 2011-10-14 MED ORDER — MIDAZOLAM HCL 5 MG/5ML IJ SOLN
INTRAMUSCULAR | Status: DC | PRN
  Administered 2011-10-14: 2 mg via INTRAVENOUS

## 2011-10-14 SURGICAL SUPPLY — 33 items
BANDAGE GAUZE ELAST BULKY 4 IN (GAUZE/BANDAGES/DRESSINGS) ×2 IMPLANT
CLOTH BEACON ORANGE TIMEOUT ST (SAFETY) ×2 IMPLANT
COVER SURGICAL LIGHT HANDLE (MISCELLANEOUS) ×2 IMPLANT
DRAIN PENROSE 12X.25 LTX STRL (MISCELLANEOUS) ×1 IMPLANT
ELECT REM PT RETURN 9FT ADLT (ELECTROSURGICAL) ×2
ELECTRODE REM PT RTRN 9FT ADLT (ELECTROSURGICAL) ×1 IMPLANT
GLOVE BIOGEL PI IND STRL 7.0 (GLOVE) IMPLANT
GLOVE BIOGEL PI INDICATOR 7.0 (GLOVE) ×2
GLOVE ECLIPSE 6.5 STRL STRAW (GLOVE) ×2 IMPLANT
GLOVE SS BIOGEL STRL SZ 6.5 (GLOVE) IMPLANT
GLOVE SUPERSENSE BIOGEL SZ 6.5 (GLOVE) ×1
GLOVE SURG SS PI 8.0 STRL IVOR (GLOVE) ×8 IMPLANT
GOWN STRL REIN XL XLG (GOWN DISPOSABLE) ×4 IMPLANT
INST SET MINOR GENERAL (KITS) ×2 IMPLANT
NDL HYPO 25X1 1.5 SAFETY (NEEDLE) IMPLANT
NEEDLE HYPO 22GX1.5 SAFETY (NEEDLE) IMPLANT
NEEDLE HYPO 25X1 1.5 SAFETY (NEEDLE) ×2 IMPLANT
NS IRRIG 1000ML POUR BTL (IV SOLUTION) ×2 IMPLANT
PACK MINOR (CUSTOM PROCEDURE TRAY) ×2 IMPLANT
SET BASIN LINEN APH (SET/KITS/TRAYS/PACK) ×2 IMPLANT
SOL PREP PROV IODINE SCRUB 4OZ (MISCELLANEOUS) ×2 IMPLANT
SPONGE GAUZE 4X4 12PLY (GAUZE/BANDAGES/DRESSINGS) ×1 IMPLANT
SPONGE LAP 4X18 X RAY DECT (DISPOSABLE) ×3 IMPLANT
SUPPORT SCROTAL LRG NO STRP (SOFTGOODS) IMPLANT
SUPPORT SCROTAL MEDIUM (SOFTGOODS) IMPLANT
SUPPORT SCROTAL XLRG NO STRP (MISCELLANEOUS) ×1 IMPLANT
SUT CHROMIC 3 0 SH 27 (SUTURE) ×2 IMPLANT
SUT CHROMIC 4 0 RB 1X27 (SUTURE) IMPLANT
SUT CHROMIC BN 1/2CIR 4-0 27IN (SUTURE) ×1 IMPLANT
SYR CONTROL 10ML LL (SYRINGE) ×1 IMPLANT
TOWEL OR 17X26 4PK STRL BLUE (TOWEL DISPOSABLE) ×1 IMPLANT
WATER STERILE IRR 1000ML POUR (IV SOLUTION) ×2 IMPLANT
YANKAUER SUCT 12FT TUBE ARGYLE (SUCTIONS) ×1 IMPLANT

## 2011-10-14 NOTE — Interval H&P Note (Signed)
History and Physical Interval Note:  10/14/2011 11:35 AM  Vincent Ramos  has presented today for surgery, with the diagnosis of right hydrocele  The various methods of treatment have been discussed with the patient and family. After consideration of risks, benefits and other options for treatment, the patient has consented to  Procedure(s) (LRB): HYDROCELECTOMY ADULT (Right) as a surgical intervention .  The patients' history has been reviewed, patient examined, no change in status, stable for surgery.  I have reviewed the patients' chart and labs.  Questions were answered to the patient's satisfaction.     Lyriq Finerty J

## 2011-10-14 NOTE — Brief Op Note (Signed)
10/14/2011  12:56 PM  PATIENT:  Vincent Ramos  61 y.o. male  PRE-OPERATIVE DIAGNOSIS:  right hydrocele  POST-OPERATIVE DIAGNOSIS:  same  PROCEDURE:  Procedure(s) (LRB): HYDROCELECTOMY ADULT (Right)  SURGEON:  Surgeon(s) and Role:    * Anner Crete, MD - Primary  PHYSICIAN ASSISTANT:   ASSISTANTS: none   ANESTHESIA:   spinal  EBL:  Total I/O In: 400 [I.V.:400] Out: -   BLOOD ADMINISTERED:none  DRAINS: Penrose drain in the right scrotum   LOCAL MEDICATIONS USED:  MARCAINE  0.25% and Amount: 10 ml  SPECIMEN:  No Specimen  DISPOSITION OF SPECIMEN:  N/A  COUNTS:  YES  TOURNIQUET:  * No tourniquets in log *  DICTATION: .Other Dictation: Dictation Number [id number]  PLAN OF CARE: Discharge to home after PACU  PATIENT DISPOSITION:  PACU - hemodynamically stable.   Delay start of Pharmacological VTE agent (>24hrs) due to surgical blood loss or risk of bleeding: not applicable

## 2011-10-14 NOTE — Addendum Note (Signed)
Addendum  created 10/14/11 1337 by Haelyn Forgey, MD   Modules edited:Anesthesia Attestations    

## 2011-10-14 NOTE — Anesthesia Postprocedure Evaluation (Signed)
  Anesthesia Post-op Note  Patient: Vincent Ramos  Procedure(s) Performed: Procedure(s) (LRB): HYDROCELECTOMY ADULT (Right)  Patient Location: PACU  Anesthesia Type: Spinal  Level of Consciousness: awake, alert , oriented and patient cooperative  Airway and Oxygen Therapy: Patient Spontanous Breathing  Post-op Pain: none  Post-op Assessment: Post-op Vital signs reviewed, Patient's Cardiovascular Status Stable, Respiratory Function Stable, Patent Airway and No signs of Nausea or vomiting  Post-op Vital Signs: Reviewed and stable  Complications: No apparent anesthesia complications

## 2011-10-14 NOTE — Addendum Note (Signed)
Addendum  created 10/14/11 1337 by Roselie Awkward, MD   Modules edited:Anesthesia Attestations

## 2011-10-14 NOTE — Anesthesia Preprocedure Evaluation (Addendum)
Anesthesia Evaluation  Patient identified by MRN, date of birth, ID band Patient awake    Reviewed: Allergy & Precautions, H&P , NPO status , Patient's Chart, lab work & pertinent test results  Airway Mallampati: II TM Distance: >3 FB Neck ROM: Full    Dental No notable dental hx.    Pulmonary shortness of breath,    Pulmonary exam normal       Cardiovascular hypertension, Pt. on medications Rhythm:Regular Rate:Normal     Neuro/Psych negative neurological ROS  negative psych ROS   GI/Hepatic   Endo/Other  Diabetes mellitus-, Well Controlled, Type 2, Insulin Dependent and Oral Hypoglycemic AgentsMorbid obesity  Renal/GU      Musculoskeletal negative musculoskeletal ROS (+)   Abdominal (+) + obese,   Peds  Hematology negative hematology ROS (+)   Anesthesia Other Findings   Reproductive/Obstetrics                          Anesthesia Physical Anesthesia Plan  ASA: III  Anesthesia Plan: Spinal   Post-op Pain Management:    Induction: Intravenous  Airway Management Planned: Nasal Cannula  Additional Equipment:   Intra-op Plan:   Post-operative Plan:   Informed Consent: I have reviewed the patients History and Physical, chart, labs and discussed the procedure including the risks, benefits and alternatives for the proposed anesthesia with the patient or authorized representative who has indicated his/her understanding and acceptance.     Plan Discussed with: CRNA  Anesthesia Plan Comments:         Anesthesia Quick Evaluation

## 2011-10-14 NOTE — Anesthesia Procedure Notes (Signed)
Spinal  Patient location during procedure: OR Start time: 10/14/2011 12:21 PM Staffing CRNA/Resident: Genean Adamski J Preanesthetic Checklist Completed: patient identified, site marked, surgical consent, pre-op evaluation, timeout performed, IV checked, risks and benefits discussed and monitors and equipment checked Spinal Block Patient position: right lateral decubitus Prep: Betasept Patient monitoring: heart rate, cardiac monitor, continuous pulse ox and blood pressure Approach: midline Location: L2-3 Injection technique: single-shot Needle Needle type: Spinocan  Needle gauge: 22 G Needle length: 15 cm Assessment Sensory level: T8 Additional Notes Unable to perform spinal tap with 3.5 inch Spinocan....easy tap with 5 inch needle;zero paresthesias or blood in needle;VSS through out procedure                           [id number]    06/2012

## 2011-10-14 NOTE — Transfer of Care (Signed)
Immediate Anesthesia Transfer of Care Note  Patient: Vincent Ramos  Procedure(s) Performed: Procedure(s) (LRB): HYDROCELECTOMY ADULT (Right)  Patient Location: PACU  Anesthesia Type: Spinal  Level of Consciousness: awake and patient cooperative  Airway & Oxygen Therapy: Patient Spontanous Breathing and Patient connected to face mask oxygen  Post-op Assessment: Report given to PACU RN and Post -op Vital signs reviewed and stable  Post vital signs: Reviewed and stable  Complications: No apparent anesthesia complications

## 2011-10-15 NOTE — Op Note (Signed)
NAMEPRIMITIVO, Vincent Ramos              ACCOUNT NO.:  [id number]  MEDICAL RECORD NO.:  [id number]  LOCATION:  [facility]                          FACILITY:  [facility]  PHYSICIAN:  Excell Seltzer. Annabell Howells, M.D.    DATE OF BIRTH:  1951/01/27  DATE OF PROCEDURE:  10/14/2011 DATE OF DISCHARGE:  10/14/2011                              OPERATIVE REPORT   PROCEDURE:  Right hydrocelectomy.  PREOPERATIVE DIAGNOSIS:  Right hydrocele.  POSTOPERATIVE DIAGNOSIS:  Right hydrocele.  SURGEON:  Excell Seltzer. Annabell Howells, MD  ANESTHESIA:  Spinal.  DRAINS:  A 0.25-inch Penrose.  SPECIMEN:  None.  BLOOD LOSS:  Minimal.  COMPLICATIONS:  None.  INDICATIONS:  [name] is a 61 year old African American male with a symptomatic right hydrocele, it was elected to hydrocelectomy.  FINDINGS OF PROCEDURE:  He was given an Ancef.  He was taken to the operating room where spinal was induced.  He was placed in the supine position.  His scrotum was clipped.  He was prepped with Betadine solution and draped in usual sterile fashion.  An oblique right anterior scrotal incision was made with a knife, this was carried through the dartos with the Bovie.  The testicle within tunica vaginalis was delivered from the scrotum.  The tunica vaginalis was opened and the hydrocele fluid was drained.  The sac was then imbricated behind the testicle in a water bottle fashion.  There was insufficient sac to do resection.  Once the imbrication had been completed and hemostasis was achieved, the cord was infiltrated with approximately 5 mL of 0.25% Marcaine for cord block and the dartos was infiltrated with an additional 5% for postop pain.  A 0.25-inch Penrose drain was brought through separate stab wound on the anterior portion of the right hemiscrotum and placed posterior to the testicle.  The dartos muscle was then closed using a running 3-0 chromic suture with care being taken to avoid catching the drain.  The skin was then closed with interrupted  vertical mattress 3-0 and 4-0 chromic.  At this point, dressing of 4x4s, fluff, Kerlix and scrotal support was applied.  The patient has taken to the recovery room in stable condition.  There were no complications.     Excell Seltzer. Annabell Howells, M.D.     JJW/MEDQ  D:  10/14/2011  T:  10/15/2011  Job:  [id number]  cc:   Tesfaye D. Felecia Shelling, MD Fax: [phone]

## 2011-10-17 ENCOUNTER — Encounter (HOSPITAL_COMMUNITY): Payer: Self-pay | Admitting: Urology

## 2011-10-21 ENCOUNTER — Ambulatory Visit (INDEPENDENT_AMBULATORY_CARE_PROVIDER_SITE_OTHER): Payer: 59 | Admitting: Urology

## 2011-10-21 DIAGNOSIS — N433 Hydrocele, unspecified: Secondary | ICD-10-CM

## 2011-10-28 ENCOUNTER — Emergency Department (HOSPITAL_COMMUNITY): Payer: 59

## 2011-10-28 ENCOUNTER — Encounter (HOSPITAL_COMMUNITY): Payer: Self-pay

## 2011-10-28 ENCOUNTER — Emergency Department (HOSPITAL_COMMUNITY)
Admission: EM | Admit: 2011-10-28 | Discharge: 2011-10-28 | Disposition: A | Discharge status: Home / Self Care | Payer: 59 | Attending: Emergency Medicine | Admitting: Emergency Medicine

## 2011-10-28 DIAGNOSIS — M5410 Radiculopathy, site unspecified: Secondary | ICD-10-CM

## 2011-10-28 DIAGNOSIS — IMO0002 Reserved for concepts with insufficient information to code with codable children: Secondary | ICD-10-CM | POA: Insufficient documentation

## 2011-10-28 DIAGNOSIS — M25559 Pain in unspecified hip: Secondary | ICD-10-CM | POA: Insufficient documentation

## 2011-10-28 DIAGNOSIS — M545 Low back pain, unspecified: Secondary | ICD-10-CM | POA: Insufficient documentation

## 2011-10-28 DIAGNOSIS — I10 Essential (primary) hypertension: Secondary | ICD-10-CM | POA: Insufficient documentation

## 2011-10-28 DIAGNOSIS — Z7982 Long term (current) use of aspirin: Secondary | ICD-10-CM | POA: Insufficient documentation

## 2011-10-28 DIAGNOSIS — M48061 Spinal stenosis, lumbar region without neurogenic claudication: Secondary | ICD-10-CM | POA: Insufficient documentation

## 2011-10-28 DIAGNOSIS — Z794 Long term (current) use of insulin: Secondary | ICD-10-CM | POA: Insufficient documentation

## 2011-10-28 DIAGNOSIS — E119 Type 2 diabetes mellitus without complications: Secondary | ICD-10-CM | POA: Insufficient documentation

## 2011-10-28 DIAGNOSIS — M539 Dorsopathy, unspecified: Secondary | ICD-10-CM | POA: Insufficient documentation

## 2011-10-28 DIAGNOSIS — Z79899 Other long term (current) drug therapy: Secondary | ICD-10-CM | POA: Insufficient documentation

## 2011-10-28 DIAGNOSIS — M519 Unspecified thoracic, thoracolumbar and lumbosacral intervertebral disc disorder: Secondary | ICD-10-CM

## 2011-10-28 DIAGNOSIS — R29818 Other symptoms and signs involving the nervous system: Secondary | ICD-10-CM | POA: Insufficient documentation

## 2011-10-28 DIAGNOSIS — R209 Unspecified disturbances of skin sensation: Secondary | ICD-10-CM | POA: Insufficient documentation

## 2011-10-28 DIAGNOSIS — M79609 Pain in unspecified limb: Secondary | ICD-10-CM | POA: Insufficient documentation

## 2011-10-28 MED ORDER — CYCLOBENZAPRINE HCL 10 MG PO TABS
10.0000 mg | ORAL_TABLET | Freq: Once | ORAL | Status: AC
  Administered 2011-10-28: 10 mg via ORAL
  Filled 2011-10-28: qty 1

## 2011-10-28 MED ORDER — PREDNISONE 10 MG PO TABS: ORAL_TABLET | ORAL | Status: DC

## 2011-10-28 MED ORDER — OXYCODONE-ACETAMINOPHEN 7.5-325 MG PO TABS: 1.0000 | ORAL_TABLET | ORAL | Status: AC | PRN

## 2011-10-28 MED ORDER — DEXAMETHASONE SODIUM PHOSPHATE 10 MG/ML IJ SOLN
6.0000 mg | Freq: Once | INTRAMUSCULAR | Status: AC
  Administered 2011-10-28: 17:00:00 via INTRAMUSCULAR
  Filled 2011-10-28: qty 1

## 2011-10-28 MED ORDER — LORAZEPAM 2 MG/ML IJ SOLN
1.0000 mg | Freq: Once | INTRAMUSCULAR | Status: AC
  Administered 2011-10-28: 15:00:00 via INTRAMUSCULAR
  Filled 2011-10-28: qty 1

## 2011-10-28 MED ORDER — HYDROMORPHONE HCL PF 1 MG/ML IJ SOLN
1.0000 mg | Freq: Once | INTRAMUSCULAR | Status: AC
  Administered 2011-10-28: 1 mg via INTRAMUSCULAR
  Filled 2011-10-28: qty 1

## 2011-10-28 NOTE — ED Notes (Signed)
Given cola and crackers. No distress.

## 2011-10-28 NOTE — ED Provider Notes (Signed)
History     CSN: [id number]  Arrival date & time 10/28/11  1220   First MD Initiated Contact with Patient 10/28/11 1323      Chief Complaint  Patient presents with  . Hip Pain    (Consider location/radiation/quality/duration/timing/severity/associated sxs/prior treatment) Patient is a 61 y.o. male presenting with back pain. The history is provided by the patient.  Back Pain  This is a recurrent problem. The current episode started more than 2 days ago (He had similar symptoms 3 months ago which have resolved until this week.). The problem occurs constantly. The problem has not changed since onset.The pain is associated with no known injury (He is very active with his job having to lift objects, bending and twisting, but denies having to lift heavy objects.). The pain is present in the lumbar spine. The quality of the pain is described as stabbing and shooting. The pain radiates to the right foot. The pain is at a severity of 9/10. The pain is severe. The symptoms are aggravated by bending, twisting and certain positions. The pain is the same all the time. Stiffness is present all day. Associated symptoms include numbness and paresthesias. Pertinent negatives include no chest pain, no fever, no abdominal pain, no abdominal swelling, no bowel incontinence, no perianal numbness, no bladder incontinence, no dysuria, no leg pain, no paresis, no tingling and no weakness.    Past Medical History  Diagnosis Date  . Diabetes mellitus   . Hypertension   . Shortness of breath     Past Surgical History  Procedure Date  . Laceration repair     left hand  . Circumcision   . Hydrocele excision 10/14/2011    Procedure: HYDROCELECTOMY ADULT;  Surgeon: Anner Crete, MD;  Location: [facility];  Service: Urology;  Laterality: Right;    Family History  Problem Relation Age of Onset  . Anesthesia problems Neg Hx   . Hypotension Neg Hx   . Malignant hyperthermia Neg Hx   . Pseudochol deficiency Neg Hx       History  Substance Use Topics  . Smoking status: Never Smoker   . Smokeless tobacco: Not on file  . Alcohol Use: Yes     occassional      Review of Systems  Constitutional: Negative for fever.  Cardiovascular: Negative for chest pain.  Gastrointestinal: Negative for abdominal pain and bowel incontinence.  Genitourinary: Negative for bladder incontinence and dysuria.  Musculoskeletal: Positive for back pain.  Neurological: Positive for numbness and paresthesias. Negative for tingling and weakness.    Allergies  Review of patient's allergies indicates no known allergies.  Home Medications   Current Outpatient Rx  Name Route Sig Dispense Refill  . AMLODIPINE BESYLATE 10 MG PO TABS Oral Take 10 mg by mouth daily.    . ASPIRIN EC 81 MG PO TBEC Oral Take 81 mg by mouth daily.    . ENALAPRIL-HYDROCHLOROTHIAZIDE 10-25 MG PO TABS Oral Take 1 tablet by mouth daily.      Marland Kitchen HYDROCODONE-ACETAMINOPHEN 5-325 MG PO TABS Oral Take 1 tablet by mouth every 6 (six) hours as needed. For pain    . HYDROCODONE-ACETAMINOPHEN 5-500 MG PO TABS Oral Take 1 tablet by mouth 2 (two) times daily as needed. For pain    . INSULIN GLARGINE 100 UNIT/ML [place] SOLN Subcutaneous Inject into the skin as directed. Per sliding scale    . LINAGLIPTIN 5 MG PO TABS Oral Take 5 mg by mouth daily.    Marland Kitchen METFORMIN  HCL 1000 MG PO TABS Oral Take 1,000 mg by mouth 2 (two) times daily.      BP 183/87  Pulse 83  Temp(Src) 98.3 F (36.8 C) (Oral)  Resp 22  Ht 5' 6.5" (1.689 m)  Wt 250 lb (113.399 kg)  BMI 39.75 kg/m2  SpO2 98%  Physical Exam  Nursing note and vitals reviewed. Constitutional: He is oriented to person, place, and time. He appears well-developed and well-nourished.  HENT:  Head: Normocephalic.  Eyes: Conjunctivae are normal.  Neck: Normal range of motion. Neck supple.  Cardiovascular: Regular rhythm and intact distal pulses.        Pedal pulses normal.  Pulmonary/Chest: Effort normal. He has no  wheezes.  Abdominal: Soft. Bowel sounds are normal. He exhibits no distension and no mass.  Genitourinary: Rectal exam shows anal tone normal.  Musculoskeletal: Normal range of motion. He exhibits no edema.       Lumbar back: He exhibits tenderness. He exhibits no swelling, no edema and no spasm.  Neurological: He is alert and oriented to person, place, and time. He has normal strength. He displays no atrophy and no tremor. A sensory deficit is present. No cranial nerve deficit. He exhibits normal muscle tone. Gait normal.  Reflex Scores:      Patellar reflexes are 1+ on the right side and 1+ on the left side.      Achilles reflexes are 1+ on the right side and 1+ on the left side.      No strength deficit noted in hip and knee flexor and extensor muscle groups.  Ankle flexion and extension intact.  Patient has decreased sensation to fine touch of entire anterior and lateral right lower extremity.  Skin: Skin is warm and dry.  Psychiatric: He has a normal mood and affect.    ED Course  Procedures (including critical care time)  Labs Reviewed - No data to display Mr Lumbar Spine Wo Contrast  10/28/2011  *RADIOLOGY REPORT*  Clinical Data: 61 year old male with low back pain radiating to the right lower extremity.  Numbness and tingling.  MRI LUMBAR SPINE WITHOUT CONTRAST  Technique:  Multiplanar and multiecho pulse sequences of the lumbar spine were obtained without intravenous contrast.  Comparison: Lumbar radiographs 06/12/2010.  Findings: Normal lumbar segmentation depicted on comparison. Visualized lower thoracic spinal cord is normal with conus medularis at L1-L2. Visualized abdominal viscera and paraspinal soft tissues are within normal limits.  Normal vertebral height and alignment. No marrow edema or evidence of acute osseous abnormality.  T11-T12:  Negative.  T12-L1:  Negative.  L1-L2:  Negative.  L2-L3:  Mild epidural lipomatosis.  Otherwise negative.  L3-L4:  Epidural lipomatosis.  Mild  facet hypertrophy.  Borderline to mild spinal stenosis related to these factors.  L4-L5:  Circumferential disc bulge.  Superimposed right paracentral disc protrusion with caudal migration of disc.  Underlying epidural lipomatosis and mild to moderate facet hypertrophy.  Combined there is moderate to severe spinal stenosis.  The right lateral recess is more affected (descending right L5 nerve level).  There is mild to moderate bilateral L4 foraminal stenosis as well.  L5-S1:  Right eccentric circumferential disc bulge.  Suggestion of an associated right lateral recess level annular tear on sagittal STIR (series 5 image 6). Furthermore, there is a low T1 signal mass felt to represent extruded disc extending into and through the right L5 neural foramen (series 7 image 30).  Moderate facet hypertrophy.  Overall mild spinal stenosis, mild to moderate right lateral  recess stenosis, and mild left L5 foraminal stenosis in addition to the right foraminal findings.  IMPRESSION: 1.  Low T1 signal mass within and extending out the right L5 neural foramen ( series 7 image 30).  Favor extruded disc, with evidence of associated right paracentral annular tear on sagittal STIR imaging.  The severely affects the exiting right L5 nerve. 2.  Superimposed multifactorial degenerative moderate to severe spinal stenosis at L4-L5 plus L4-L5 and L5-S1 right lateral recess stenosis.  Original Report Authenticated By: Ulla Potash III, M.D.     1. Ruptured disk   2. Spinal stenosis of lumbar region   3. Radiculopathy of leg       MDM  Significant findings on MRI including an extruded disc affecting the L5 nerve with severe spinal stenosis from L4-S1.  Results reviewed.  Spoke with Dr. Danielle Dess regarding this MRI finding.  He has recommended Decadron 6 mg IM today followed with a six-day prednisone taper along with Percocet.  Advised patient to call his office for a followup appointment with him early next week.  Patient is agreeable to  this plan.        Candis Musa, PA 10/28/11 1654  Candis Musa, PA 10/28/11 1655

## 2011-10-28 NOTE — ED Notes (Signed)
C/o rt hip pain with radiation to toes. Ambulates without difficulty.

## 2011-10-28 NOTE — ED Notes (Signed)
Patient transported to X-ray 

## 2011-10-28 NOTE — ED Notes (Signed)
Patient transported to MRI 

## 2011-10-28 NOTE — Discharge Instructions (Signed)
Herniated Disk The bones of your spinal column (vertebrae) protect your spinal cord and nerves that go into your arms and legs. The vertebrae are separated by disks that cushion the spinal column and put space between your vertebrae. This allows movement between the vertebrae, which allows you to bend, rotate, and move your body from side to side. Sometimes, the disks move out of place (herniate) or break open (rupture) from injury or strain. The most common area for a disk herniation is in the lower back (lumbar area). Sometimes herniation occurs in the neck (cervical) disks.  CAUSES  As we grow older, the strong, fibrous cords that connect the vertebrae and support and surround the disks (ligaments) start to weaken. A strain on the back may cause a break in the disk ligaments. RISK FACTORS Herniated disks occur most often in men who are aged 18 years to 35 years, usually after strenuous activity. Other risk factors include conditions present at birth (congenital) that affect the size of the lumbar spinal canal. Additionally, a narrowing of the areas where the nerves exit the spinal canal can occur as you age. SYMPTOMS  Symptoms of a herniated disk vary. You may have weakness in certain muscles. This weakness can include difficulty lifting your leg or arm, difficulty standing on your toes on one side, or difficulty squeezing tightly with one of your hands. You may have numbness. You may feel a mild tingling, dull ache, or a burning or pulsating pain. In some cases, the pain is severe enough that you are unable to move. The pain most often occurs on one side of the body. The pain often starts slowly. It may get worse:  After you sit or stand.   At night.   When you sneeze, cough, or laugh.   When you bend backwards or walk more than a few yards.  The pain, numbness, or weakness will often go away or improve a lot over a period of weeks to months. Herniated lumbar disk Symptoms of a herniated  lumbar disk may include sharp pain in one part of your leg, hip, or buttocks and numbness in other parts. You also may feel pain or numbness on the back of your calf or the top or sole of your foot. The same leg also may feel weak. Herniated cervical disk Symptoms of a herniated cervical disk may include pain when you move your neck, deep pain near or over your shoulder blade, or pain that moves to your upper arm, forearm, or fingers. DIAGNOSIS  To diagnose a herniated disk, your caregiver will perform a physical exam. Your caregiver also may perform diagnostic tests to see your disk or to test the reaction of your muscles and the function of your nerves. During the physical exam, your caregiver may ask you to:  Sit, stand, and walk. While you walk, your caregiver may ask you to try walking on your toes and then your heels.   Bend forward, backward, and sideways.   Raise your shoulders, elbow, wrist, and fingers and check your strength during these tasks.  Your caregiver will check for:  Numbness or loss of feeling.   Muscle reflexes, which may be slower or missing.   Muscle strength, which may be weaker.   Posture or the way your spine curves.  Diagnostic tests that may be done include:  A spinal X-ray exam to rule out other causes of back pain.   Magnetic resonance imaging (MRI) or computed tomography (CT) scan, which will show   if the herniated disk is pressing on your spinal canal.   Electromyography. This is sometimes used to identify the specific area of nerve involvement.  TREATMENT  Initial treatment for a herniated disk is a short period of rest with medicines for pain. Pain medicines can include nonsteroidal anti-inflammatory medicines (NSAIDs), muscle relaxants for back spasms, and (rarely) narcotic pain medicine for severe pain that does not respond to NSAID use. Bed rest is often limited to 1 or 2 days at the most because prolonged rest can delay recovery. When the herniation  involves the lower back, sitting should be avoided as much as possible because sitting increases pressure on the ruptured disk. Sometimes a soft neck collar will be prescribed for a few days to weeks to help support your neck in the case of a cervical herniation. Physical therapy is often prescribed for patients with disk disease. Physical therapists will teach you how to properly lift, dress, walk, and perform other activities. They will work on strengthening the muscles that help support your spine. In some cases, physical therapy alone is not enough to treat a herniated disk. Steroid injections along the involved nerve root may be needed to help control pain. The steroid is injected in the area of the herniated disk and helps by reducing swelling around the disk. Sometimes surgery is the best option to treat a herniated disk.  SEEK IMMEDIATE MEDICAL CARE IF:   You have numbness, tingling, weakness, or problems with the use of your arms or legs.   You have severe headaches that are not relieved with the use of medicines.   You notice a change in your bowel or bladder control.   You have increasing pain in any areas of your body.   You experience shortness of breath, dizziness, or fainting.  MAKE SURE YOU:   Understand these instructions.   Will watch your condition.   Will get help right away if you are not doing well or get worse.  Document Released: 07/15/2000 Document Revised: 07/07/2011 Document Reviewed: 02/18/2011 [facility] Patient Information 2012 [place], [place].Lumbosacral Radiculopathy Lumbosacral radiculopathy is a pinched nerve or nerves in the low back (lumbosacral area). When this happens you may have weakness in your legs and may not be able to stand on your toes. You may have pain going down into your legs. There may be difficulties with walking normally. There are many causes of this problem. Sometimes this may happen from an injury, or simply from arthritis or boney  problems. It may also be caused by other illnesses such as diabetes. If there is no improvement after treatment, further studies may be done to find the exact cause. DIAGNOSIS  X-rays may be needed if the problems become long standing. Electromyograms may be done. This study is one in which the working of nerves and muscles is studied. HOME CARE INSTRUCTIONS   Applications of ice packs may be helpful. Ice can be used in a plastic bag with a towel around it to prevent frostbite to skin. This may be used every 2 hours for 20 to 30 minutes, or as needed, while awake, or as directed by your caregiver.   Only take over-the-counter or prescription medicines for pain, discomfort, or fever as directed by your caregiver.   If physical therapy was prescribed, follow your caregiver's directions.  SEEK IMMEDIATE MEDICAL CARE IF:   You have pain not controlled with medications.   You seem to be getting worse rather than better.   You develop increasing weakness  in your legs.   You develop loss of bowel or bladder control.   You have difficulty with walking or balance, or develop clumsiness in the use of your legs.   You have a fever.  MAKE SURE YOU:   Understand these instructions.   Will watch your condition.   Will get help right away if you are not doing well or get worse.  Document Released: 07/18/2005 Document Revised: 07/07/2011 Document Reviewed: 03/07/2008 [facility] Patient Information 2012 [place], [place].

## 2011-10-28 NOTE — ED Notes (Signed)
Pain rt hip down to toes since yesterday.  Has had similar sx in past.  Says he needs to have MRI.  Has numb sensation in leg also.

## 2011-11-01 NOTE — ED Provider Notes (Signed)
Medical screening examination/treatment/procedure(s) were performed by non-physician practitioner and as supervising physician I was immediately available for consultation/collaboration.Marland Kitchen  MRI shows HNP at L5 as well as spinal stenosis from L4-S1.  Discussed with neurosurgery. Rx steroids and pain medicine. Followup with neurosurgeon.  Donnetta Hutching, MD 11/01/11 0130

## 2011-11-03 ENCOUNTER — Telehealth (HOSPITAL_COMMUNITY): Payer: Self-pay | Admitting: *Deleted

## 2011-11-03 NOTE — ED Notes (Signed)
Pt. said he was seen at [facility] ED and had an MRI. He missed his appt. yesterday here in [place]. Can't remember who he was supposed to see.  States he needs a refill of his pain medication. I told pt. this is the [facility] in [place]. We would not be able to refill his pain medication. I accessed pt.'s chart and told him he was to f/u with Dr. Danielle Dess and gave him the address and phone number to reschedule his appt. I told him he can try going back to the ED in [employer] for a refill of his pain medicine. Vassie Moselle 11/03/2011

## 2012-01-20 ENCOUNTER — Ambulatory Visit (INDEPENDENT_AMBULATORY_CARE_PROVIDER_SITE_OTHER): Payer: 59 | Admitting: Urology

## 2012-01-20 DIAGNOSIS — N433 Hydrocele, unspecified: Secondary | ICD-10-CM

## 2012-07-29 ENCOUNTER — Emergency Department (HOSPITAL_COMMUNITY)
Admission: EM | Admit: 2012-07-29 | Discharge: 2012-07-29 | Disposition: A | Discharge status: Home / Self Care | Payer: 59 | Attending: Emergency Medicine | Admitting: Emergency Medicine

## 2012-07-29 ENCOUNTER — Encounter (HOSPITAL_COMMUNITY): Payer: Self-pay

## 2012-07-29 DIAGNOSIS — Z794 Long term (current) use of insulin: Secondary | ICD-10-CM | POA: Insufficient documentation

## 2012-07-29 DIAGNOSIS — R509 Fever, unspecified: Secondary | ICD-10-CM | POA: Insufficient documentation

## 2012-07-29 DIAGNOSIS — E119 Type 2 diabetes mellitus without complications: Secondary | ICD-10-CM | POA: Insufficient documentation

## 2012-07-29 DIAGNOSIS — Z7982 Long term (current) use of aspirin: Secondary | ICD-10-CM | POA: Insufficient documentation

## 2012-07-29 DIAGNOSIS — B9789 Other viral agents as the cause of diseases classified elsewhere: Secondary | ICD-10-CM | POA: Insufficient documentation

## 2012-07-29 DIAGNOSIS — I10 Essential (primary) hypertension: Secondary | ICD-10-CM | POA: Insufficient documentation

## 2012-07-29 DIAGNOSIS — B349 Viral infection, unspecified: Secondary | ICD-10-CM

## 2012-07-29 DIAGNOSIS — Z79899 Other long term (current) drug therapy: Secondary | ICD-10-CM | POA: Insufficient documentation

## 2012-07-29 NOTE — ED Provider Notes (Signed)
History   This chart was scribed for Donnetta Hutching, MD by Leone Payor, ED Scribe. This patient was seen in room [facility] and the patient's care was started at 1803.   CSN: [id number]  Arrival date & time 07/29/12  1747   First MD Initiated Contact with Patient 07/29/12 1803      Chief Complaint  Patient presents with  . Influenza     The history is provided by the patient. No language interpreter was used.    Vincent Ramos is a 61 y.o. male who presents to the Emergency Department complaining of a new, mild cough with associated fever starting a couple days ago. Pt reports being in close contact with his son who was recently diagnosed with the flu. Pt reports taking Robitussin and using his wife's breathing machine with moderate relief.   Pt has h/o DM, HTN, SOB.  Pt denies smoking but occasional alcohol use.  Past Medical History  Diagnosis Date  . Diabetes mellitus   . Hypertension   . Shortness of breath     Past Surgical History  Procedure Date  . Laceration repair     left hand  . Circumcision   . Hydrocele excision 10/14/2011    Procedure: HYDROCELECTOMY ADULT;  Surgeon: Anner Crete, MD;  Location: [facility];  Service: Urology;  Laterality: Right;    Family History  Problem Relation Age of Onset  . Anesthesia problems Neg Hx   . Hypotension Neg Hx   . Malignant hyperthermia Neg Hx   . Pseudochol deficiency Neg Hx     History  Substance Use Topics  . Smoking status: Never Smoker   . Smokeless tobacco: Not on file  . Alcohol Use: Yes     Comment: occassional      Review of Systems A complete 10 system review of systems was obtained and all systems are negative except as noted in the HPI and PMH.    Allergies  Review of patient's allergies indicates no known allergies.  Home Medications   Current Outpatient Rx  Name  Route  Sig  Dispense  Refill  . AMLODIPINE BESYLATE 10 MG PO TABS   Oral   Take 10 mg by mouth daily.         . ASPIRIN EC 81  MG PO TBEC   Oral   Take 81 mg by mouth daily.         . ENALAPRIL-HYDROCHLOROTHIAZIDE 10-25 MG PO TABS   Oral   Take 1 tablet by mouth daily.           Marland Kitchen HYDROCODONE-ACETAMINOPHEN 5-325 MG PO TABS   Oral   Take 1 tablet by mouth every 6 (six) hours as needed. For pain         . HYDROCODONE-ACETAMINOPHEN 5-500 MG PO TABS   Oral   Take 1 tablet by mouth 2 (two) times daily as needed. For pain         . INSULIN GLARGINE 100 UNIT/ML [place] SOLN   Subcutaneous   Inject into the skin as directed. Per sliding scale         . LINAGLIPTIN 5 MG PO TABS   Oral   Take 5 mg by mouth daily.         Marland Kitchen METFORMIN HCL 1000 MG PO TABS   Oral   Take 1,000 mg by mouth 2 (two) times daily.         Marland Kitchen PREDNISONE 10 MG PO TABS  6, 5, 4, 3, 2 then 1 tablet by mouth daily for 6 days total.   21 tablet   0     BP 149/67  Pulse 135  Temp 101 F (38.3 C) (Oral)  Resp 20  Ht 5' 6.5" (1.689 m)  Wt 245 lb (111.131 kg)  BMI 38.95 kg/m2  SpO2 95%  Physical Exam  Nursing note and vitals reviewed. Constitutional: He is oriented to person, place, and time. He appears well-developed and well-nourished.       Obese.   HENT:  Head: Normocephalic and atraumatic.  Eyes: Conjunctivae normal and EOM are normal. Pupils are equal, round, and reactive to light.  Neck: Normal range of motion. Neck supple.  Cardiovascular: Normal rate, regular rhythm and normal heart sounds.   Pulmonary/Chest: Effort normal and breath sounds normal.  Abdominal: Soft. Bowel sounds are normal.  Musculoskeletal: Normal range of motion.  Neurological: He is alert and oriented to person, place, and time.  Skin: Skin is warm and dry.  Psychiatric: He has a normal mood and affect.    ED Course  Procedures (including critical care time)  DIAGNOSTIC STUDIES: Oxygen Saturation is 95% on room air, adequate by my interpretation.    COORDINATION OF CARE:  6:15PM Patient informed of current plan for treatment  and evaluation and agrees with plan at this time.       Labs Reviewed - No data to display No results found.   No diagnosis found.    MDM  History and physical consistent with viral syndrome. Patient is nontoxic.    I personally performed the services described in this documentation, which was scribed in my presence. The recorded information has been reviewed and is accurate.     Donnetta Hutching, MD 07/29/12 [phone]

## 2012-07-29 NOTE — ED Notes (Signed)
Pt reports flu-like symptoms that started today. Son was sick w/ same earlier this weekend

## 2012-07-29 NOTE — ED Notes (Signed)
Pt alert & oriented x4, stable gait. Patient given discharge instructions, paperwork & prescription(s). Patient  instructed to stop at the registration desk to finish any additional paperwork. Patient verbalized understanding. Pt left department w/ no further questions. 

## 2012-11-27 ENCOUNTER — Ambulatory Visit (HOSPITAL_COMMUNITY)
Admission: RE | Admit: 2012-11-27 | Discharge: 2012-11-27 | Disposition: A | Discharge status: Home / Self Care | Payer: 59 | Source: Ambulatory Visit | Attending: Internal Medicine | Admitting: Internal Medicine

## 2012-11-27 ENCOUNTER — Other Ambulatory Visit (HOSPITAL_COMMUNITY): Payer: Self-pay | Admitting: Internal Medicine

## 2012-11-27 DIAGNOSIS — J984 Other disorders of lung: Secondary | ICD-10-CM | POA: Insufficient documentation

## 2012-11-27 DIAGNOSIS — R0602 Shortness of breath: Secondary | ICD-10-CM

## 2012-11-28 ENCOUNTER — Ambulatory Visit (HOSPITAL_COMMUNITY)
Admission: RE | Admit: 2012-11-28 | Discharge: 2012-11-28 | Disposition: A | Discharge status: Home / Self Care | Payer: 59 | Source: Ambulatory Visit | Attending: Internal Medicine | Admitting: Internal Medicine

## 2012-11-28 DIAGNOSIS — R0602 Shortness of breath: Secondary | ICD-10-CM | POA: Insufficient documentation

## 2012-11-28 DIAGNOSIS — E119 Type 2 diabetes mellitus without complications: Secondary | ICD-10-CM | POA: Insufficient documentation

## 2012-11-28 DIAGNOSIS — I517 Cardiomegaly: Secondary | ICD-10-CM

## 2012-11-28 DIAGNOSIS — I10 Essential (primary) hypertension: Secondary | ICD-10-CM | POA: Insufficient documentation

## 2012-11-28 NOTE — Progress Notes (Signed)
*  PRELIMINARY RESULTS* Echocardiogram 2D Echocardiogram has been performed.  Vincent Ramos 11/28/2012, 9:39 AM

## 2013-01-04 ENCOUNTER — Other Ambulatory Visit: Payer: Self-pay | Admitting: Cardiovascular Disease

## 2013-01-04 LAB — HEMOGLOBIN A1C
Hgb A1c MFr Bld: 8.3 % — ABNORMAL HIGH (ref <5.7)
Mean Plasma Glucose: 192 mg/dL — ABNORMAL HIGH (ref <117)

## 2013-01-04 LAB — CBC WITH DIFFERENTIAL/PLATELET
Basophils Absolute: 0 10*3/uL (ref 0.0–0.1)
Basophils Relative: 1 % (ref 0–1)
Eosinophils Absolute: 0.3 10*3/uL (ref 0.0–0.7)
MCH: 26.2 pg (ref 26.0–34.0)
MCHC: 34 g/dL (ref 30.0–36.0)
Neutrophils Relative %: 48 % (ref 43–77)
Platelets: 158 10*3/uL (ref 150–400)
RBC: 5.26 MIL/uL (ref 4.22–5.81)

## 2013-01-04 LAB — COMPREHENSIVE METABOLIC PANEL
ALT: 23 U/L (ref 0–53)
Alkaline Phosphatase: 60 U/L (ref 39–117)
Sodium: 141 mEq/L (ref 135–145)
Total Bilirubin: 0.7 mg/dL (ref 0.3–1.2)
Total Protein: 6.4 g/dL (ref 6.0–8.3)

## 2013-01-04 LAB — LIPID PANEL
LDL Cholesterol: 122 mg/dL — ABNORMAL HIGH (ref 0–99)
VLDL: 26 mg/dL (ref 0–40)

## 2013-01-17 ENCOUNTER — Other Ambulatory Visit: Payer: Self-pay | Admitting: Cardiovascular Disease

## 2013-01-17 DIAGNOSIS — I10 Essential (primary) hypertension: Secondary | ICD-10-CM

## 2013-01-17 DIAGNOSIS — R0602 Shortness of breath: Secondary | ICD-10-CM

## 2013-01-22 ENCOUNTER — Encounter: Payer: Self-pay | Admitting: Cardiovascular Disease

## 2013-01-23 ENCOUNTER — Telehealth: Payer: Self-pay | Admitting: Cardiovascular Disease

## 2013-01-23 NOTE — Telephone Encounter (Signed)
LEFT MESSAGE TO CALL BACK AND SCHEDULE TESTING ORDERED BY RAW

## 2013-02-19 ENCOUNTER — Other Ambulatory Visit: Payer: Self-pay | Admitting: *Deleted

## 2013-02-19 DIAGNOSIS — I10 Essential (primary) hypertension: Secondary | ICD-10-CM

## 2013-03-07 ENCOUNTER — Telehealth (HOSPITAL_COMMUNITY): Payer: Self-pay | Admitting: Cardiovascular Disease

## 2013-03-15 ENCOUNTER — Telehealth (HOSPITAL_COMMUNITY): Payer: Self-pay | Admitting: Cardiovascular Disease

## 2013-04-05 ENCOUNTER — Encounter (HOSPITAL_COMMUNITY): Payer: Self-pay | Admitting: Cardiovascular Disease

## 2013-05-28 ENCOUNTER — Ambulatory Visit (HOSPITAL_COMMUNITY)
Admission: RE | Admit: 2013-05-28 | Discharge: 2013-05-28 | Disposition: A | Discharge status: Home / Self Care | Payer: 59 | Source: Ambulatory Visit | Attending: Cardiovascular Disease | Admitting: Cardiovascular Disease

## 2013-05-28 DIAGNOSIS — R5381 Other malaise: Secondary | ICD-10-CM | POA: Insufficient documentation

## 2013-05-28 DIAGNOSIS — R002 Palpitations: Secondary | ICD-10-CM | POA: Insufficient documentation

## 2013-05-28 DIAGNOSIS — Z87891 Personal history of nicotine dependence: Secondary | ICD-10-CM | POA: Insufficient documentation

## 2013-05-28 DIAGNOSIS — R0989 Other specified symptoms and signs involving the circulatory and respiratory systems: Secondary | ICD-10-CM

## 2013-05-28 DIAGNOSIS — I10 Essential (primary) hypertension: Secondary | ICD-10-CM | POA: Insufficient documentation

## 2013-05-28 DIAGNOSIS — Z794 Long term (current) use of insulin: Secondary | ICD-10-CM | POA: Insufficient documentation

## 2013-05-28 DIAGNOSIS — R0602 Shortness of breath: Secondary | ICD-10-CM | POA: Insufficient documentation

## 2013-05-28 DIAGNOSIS — R9439 Abnormal result of other cardiovascular function study: Secondary | ICD-10-CM | POA: Insufficient documentation

## 2013-05-28 DIAGNOSIS — E119 Type 2 diabetes mellitus without complications: Secondary | ICD-10-CM | POA: Insufficient documentation

## 2013-05-28 DIAGNOSIS — E669 Obesity, unspecified: Secondary | ICD-10-CM | POA: Insufficient documentation

## 2013-05-28 DIAGNOSIS — R0609 Other forms of dyspnea: Secondary | ICD-10-CM | POA: Insufficient documentation

## 2013-05-28 DIAGNOSIS — R079 Chest pain, unspecified: Secondary | ICD-10-CM

## 2013-05-28 MED ORDER — TECHNETIUM TC 99M SESTAMIBI GENERIC - CARDIOLITE
10.6000 | Freq: Once | INTRAVENOUS | Status: AC | PRN
  Administered 2013-05-28: 11 via INTRAVENOUS

## 2013-05-28 MED ORDER — REGADENOSON 0.4 MG/5ML IV SOLN
0.4000 mg | Freq: Once | INTRAVENOUS | Status: AC
  Administered 2013-05-28: 0.4 mg via INTRAVENOUS

## 2013-05-28 MED ORDER — TECHNETIUM TC 99M SESTAMIBI GENERIC - CARDIOLITE
30.9000 | Freq: Once | INTRAVENOUS | Status: AC | PRN
  Administered 2013-05-28: 30.9 via INTRAVENOUS

## 2013-05-28 MED ORDER — AMINOPHYLLINE 25 MG/ML IV SOLN
75.0000 mg | Freq: Once | INTRAVENOUS | Status: AC
  Administered 2013-05-28: 75 mg via INTRAVENOUS

## 2013-05-28 NOTE — Procedures (Addendum)
[facility] [address] [place] [place] [address] [phone]  Cardiology Nuclear Med Vincent Ramos is a 62 y.o. male     MRN : [id number]     DOB: 1951-02-27  Procedure Date: 05/28/2013  Nuclear Med Background Indication for Stress Test:  Evaluation for Ischemia History:  No prior history reported. Cardiac Risk Factors: History of Smoking, Hypertension, IDDM Type 2 and Obesity  Symptoms:  Chest Pain, DOE, Fatigue, Palpitations and SOB   Nuclear Pre-Procedure Caffeine/Decaff Intake:  9:00pm NPO After: 7:00am   IV Site: R Hand  IV 0.9% NS with Angio Cath:  22g  Chest Size (in):  48"  IV Started by: Emmit Pomfret, RN  Height: 5\' 7"  (1.702 m)  Cup Size: n/a  BMI:  Body mass index is 43.06 kg/(m^2). Weight:  275 lb (124.739 kg)   Tech Comments:  n/a    Nuclear Med Study 1 or 2 day study: 1 day  Stress Test Type:  Lexiscan  Order Authorizing Provider:  Susa Griffins, MD   Resting Radionuclide: Technetium 55m Sestamibi  Resting Radionuclide Dose: 10.6 mCi   Stress Radionuclide:  Technetium 38m Sestamibi  Stress Radionuclide Dose: 30.9 mCi           Stress Protocol Rest HR: 67 Stress HR: 87  Rest BP: 180/90 Stress BP: 181/78  Exercise Time (min): n/a METS: n/a          Dose of Adenosine (mg):  n/a Dose of Lexiscan: 0.4 mg  Dose of Atropine (mg): n/a Dose of Dobutamine: n/a mcg/kg/min (at max HR)  Stress Test Technologist: Ernestene Mention, CCT Nuclear Technologist: Gonzella Lex, CNMT   Rest Procedure:  Myocardial perfusion imaging was performed at rest 45 minutes following the intravenous administration of Technetium 45m Sestamibi. Stress Procedure:  The patient received IV Lexiscan 0.4 mg over 15-seconds.  Technetium 71m Sestamibi injected at 30-seconds.  Due to patient's shortness of breath and chest tightness, he was given IV Aminophylline 75 mg. Symptoms were resolved during recovery. There were no  significant changes with Lexiscan.  Quantitative spect images were obtained after a 45 minute delay.  Transient Ischemic Dilatation (Normal <1.22):  0.94 Lung/Heart Ratio (Normal <0.45):  0.29 QGS EDV:  143 ml QGS ESV:  86 ml LV Ejection Fraction: 40%      Rest ECG: NSR with non-specific ST-T wave changes and NSR-LVH  Stress ECG: No significant change from baseline ECG  QPS Raw Data Images:  Normal; no motion artifact; normal heart/lung ratio. Stress Images:  There is decreased uptake in the inferior wall. Rest Images:  There is decreased uptake in the inferior wall. Subtraction (SDS):  No evidence of ischemia. LV Wall Motion:  Moderately depressed overall LV function, EF 40%; Inferior hypokinesis.  Impression Exercise Capacity:  Lexiscan with no exercise. BP Response:  Normal blood pressure response. Clinical Symptoms:  No significant symptoms noted. ECG Impression:  No significant ECG changes with Lexiscan. Comparison with Prior Nuclear Study: No previous nuclear study performed   Overall Impression:  Low risk stress nuclear study with inferior wall scar and depressed systolic function.[name], MD  05/28/2013 1:25 PM

## 2013-06-03 ENCOUNTER — Ambulatory Visit (HOSPITAL_COMMUNITY)
Admission: RE | Admit: 2013-06-03 | Discharge: 2013-06-03 | Disposition: A | Discharge status: Home / Self Care | Payer: 59 | Source: Ambulatory Visit | Attending: Cardiovascular Disease | Admitting: Cardiovascular Disease

## 2013-06-03 DIAGNOSIS — I10 Essential (primary) hypertension: Secondary | ICD-10-CM

## 2013-06-03 NOTE — Progress Notes (Signed)
Renal Duplex Completed. [place] Basham, BS, RDMS, RVT  

## 2013-06-06 ENCOUNTER — Telehealth: Payer: Self-pay | Admitting: *Deleted

## 2013-06-06 NOTE — Telephone Encounter (Signed)
Message copied by Tobin Chad on Thu Jun 06, 2013 12:18 PM ------      Message from: Marykay Lex      Created: Wed Jun 05, 2013 12:19 AM       Normal study -- no sign of Renal Artery stenosis. So no help with figuring out source of HTN.            Marykay Lex, MD       ------

## 2013-06-06 NOTE — Telephone Encounter (Signed)
Spoke to patient. Result given . Verbalized understanding. Patient was aware Dr Alanda Amass had retired ,RN asked if patient had decided on which cardiologists to follow up with. Patient states he will go back to PCP-Dr Fanta.

## 2014-02-04 ENCOUNTER — Other Ambulatory Visit: Payer: Self-pay

## 2014-02-04 MED ORDER — POTASSIUM CHLORIDE CRYS ER 20 MEQ PO TBCR: 20.0000 meq | EXTENDED_RELEASE_TABLET | Freq: Every day | ORAL | Status: DC

## 2014-02-04 NOTE — Telephone Encounter (Signed)
Rx was sent to pharmacy electronically. Patient's last office visit - 02/19/2013 Dr Marella Chimes.

## 2014-03-18 ENCOUNTER — Telehealth: Payer: Self-pay | Admitting: *Deleted

## 2014-03-18 NOTE — Telephone Encounter (Signed)
Pt called to schedule his Colonoscopy. Please advise [phone]

## 2014-04-03 NOTE — Telephone Encounter (Signed)
Pt said he is concerned about his insurance and he will check it out and call when he is ready to schedule.

## 2014-07-31 ENCOUNTER — Emergency Department (HOSPITAL_COMMUNITY): Payer: BLUE CROSS/BLUE SHIELD

## 2014-07-31 ENCOUNTER — Encounter (HOSPITAL_COMMUNITY): Payer: Self-pay

## 2014-07-31 ENCOUNTER — Inpatient Hospital Stay (HOSPITAL_COMMUNITY)
Admission: EM | Admit: 2014-07-31 | Discharge: 2014-08-04 | DRG: 194 | Disposition: A | Discharge status: Home / Self Care | Payer: BLUE CROSS/BLUE SHIELD | Attending: Internal Medicine | Admitting: Internal Medicine

## 2014-07-31 DIAGNOSIS — I10 Essential (primary) hypertension: Secondary | ICD-10-CM | POA: Diagnosis present

## 2014-07-31 DIAGNOSIS — R079 Chest pain, unspecified: Secondary | ICD-10-CM

## 2014-07-31 DIAGNOSIS — Z794 Long term (current) use of insulin: Secondary | ICD-10-CM | POA: Diagnosis not present

## 2014-07-31 DIAGNOSIS — R0600 Dyspnea, unspecified: Secondary | ICD-10-CM | POA: Diagnosis present

## 2014-07-31 DIAGNOSIS — E119 Type 2 diabetes mellitus without complications: Secondary | ICD-10-CM | POA: Diagnosis present

## 2014-07-31 DIAGNOSIS — R0902 Hypoxemia: Secondary | ICD-10-CM | POA: Diagnosis present

## 2014-07-31 DIAGNOSIS — J189 Pneumonia, unspecified organism: Principal | ICD-10-CM

## 2014-07-31 DIAGNOSIS — E669 Obesity, unspecified: Secondary | ICD-10-CM | POA: Diagnosis present

## 2014-07-31 DIAGNOSIS — R059 Cough, unspecified: Secondary | ICD-10-CM

## 2014-07-31 DIAGNOSIS — R05 Cough: Secondary | ICD-10-CM

## 2014-07-31 DIAGNOSIS — Z6841 Body Mass Index (BMI) 40.0 and over, adult: Secondary | ICD-10-CM

## 2014-07-31 HISTORY — DX: Obesity, unspecified: E66.9

## 2014-07-31 HISTORY — DX: Pneumonia, unspecified organism: J18.9

## 2014-07-31 LAB — BASIC METABOLIC PANEL
ANION GAP: 6 (ref 5–15)
BUN: 12 mg/dL (ref 6–23)
CALCIUM: 8.7 mg/dL (ref 8.4–10.5)
CO2: 28 mmol/L (ref 19–32)
Chloride: 102 mEq/L (ref 96–112)
Creatinine, Ser: 1.38 mg/dL — ABNORMAL HIGH (ref 0.50–1.35)
GFR, EST AFRICAN AMERICAN: 62 mL/min — AB (ref >90)
GFR, EST NON AFRICAN AMERICAN: 54 mL/min — AB (ref >90)
GLUCOSE: 243 mg/dL — AB (ref 70–99)
Potassium: 3.6 mmol/L (ref 3.5–5.1)
SODIUM: 136 mmol/L (ref 135–145)

## 2014-07-31 LAB — CBC
HCT: 43.4 % (ref 39.0–52.0)
Hemoglobin: 13.5 g/dL (ref 13.0–17.0)
MCH: 25.6 pg — ABNORMAL LOW (ref 26.0–34.0)
MCHC: 31.1 g/dL (ref 30.0–36.0)
MCV: 82.4 fL (ref 78.0–100.0)
PLATELETS: 150 10*3/uL (ref 150–400)
RBC: 5.27 MIL/uL (ref 4.22–5.81)
RDW: 15.3 % (ref 11.5–15.5)
WBC: 6 10*3/uL (ref 4.0–10.5)

## 2014-07-31 LAB — I-STAT CG4 LACTIC ACID, ED: LACTIC ACID, VENOUS: 1.78 mmol/L (ref 0.5–2.2)

## 2014-07-31 LAB — LACTIC ACID, PLASMA: Lactic Acid, Venous: 1.1 mmol/L (ref 0.5–2.2)

## 2014-07-31 LAB — GLUCOSE, CAPILLARY: GLUCOSE-CAPILLARY: 248 mg/dL — AB (ref 70–99)

## 2014-07-31 LAB — TROPONIN I: Troponin I: 0.03 ng/mL (ref <0.031)

## 2014-07-31 LAB — INFLUENZA PANEL BY PCR (TYPE A & B)
H1N1FLUPCR: NOT DETECTED
Influenza A By PCR: NEGATIVE
Influenza B By PCR: NEGATIVE

## 2014-07-31 LAB — BRAIN NATRIURETIC PEPTIDE: B Natriuretic Peptide: 33 pg/mL (ref 0.0–100.0)

## 2014-07-31 MED ORDER — INSULIN ASPART 100 UNIT/ML ~~LOC~~ SOLN
0.0000 [IU] | Freq: Three times a day (TID) | SUBCUTANEOUS | Status: DC
Start: 2014-08-01 — End: 2014-08-04
  Administered 2014-08-01: 11 [IU] via SUBCUTANEOUS
  Administered 2014-08-01 (×2): 7 [IU] via SUBCUTANEOUS
  Administered 2014-08-02 – 2014-08-03 (×3): 4 [IU] via SUBCUTANEOUS

## 2014-07-31 MED ORDER — SODIUM CHLORIDE 0.9 % IV BOLUS (SEPSIS)
500.0000 mL | Freq: Once | INTRAVENOUS | Status: AC
  Administered 2014-07-31: 500 mL via INTRAVENOUS

## 2014-07-31 MED ORDER — AZITHROMYCIN 250 MG PO TABS
500.0000 mg | ORAL_TABLET | Freq: Once | ORAL | Status: AC
  Administered 2014-07-31: 500 mg via ORAL
  Filled 2014-07-31: qty 2

## 2014-07-31 MED ORDER — ENALAPRIL MALEATE 5 MG PO TABS
40.0000 mg | ORAL_TABLET | Freq: Every day | ORAL | Status: DC
  Filled 2014-07-31: qty 2
  Filled 2014-07-31: qty 8
  Filled 2014-07-31 (×3): qty 2

## 2014-07-31 MED ORDER — POTASSIUM CHLORIDE CRYS ER 20 MEQ PO TBCR
20.0000 meq | EXTENDED_RELEASE_TABLET | Freq: Every day | ORAL | Status: DC
Start: 2014-07-31 — End: 2014-08-04
  Administered 2014-07-31 – 2014-08-04 (×5): 20 meq via ORAL
  Filled 2014-07-31 (×5): qty 1

## 2014-07-31 MED ORDER — ALBUTEROL SULFATE (2.5 MG/3ML) 0.083% IN NEBU
5.0000 mg | INHALATION_SOLUTION | Freq: Once | RESPIRATORY_TRACT | Status: AC
  Administered 2014-07-31: 5 mg via RESPIRATORY_TRACT
  Filled 2014-07-31: qty 6

## 2014-07-31 MED ORDER — SODIUM CHLORIDE 0.9 % IV SOLN
INTRAVENOUS | Status: DC
  Administered 2014-07-31 – 2014-08-01 (×2): via INTRAVENOUS

## 2014-07-31 MED ORDER — AMLODIPINE BESYLATE 5 MG PO TABS
5.0000 mg | ORAL_TABLET | Freq: Every day | ORAL | Status: DC
  Filled 2014-07-31: qty 1

## 2014-07-31 MED ORDER — CANAGLIFLOZIN 100 MG PO TABS
100.0000 mg | ORAL_TABLET | Freq: Every day | ORAL | Status: DC
  Administered 2014-08-01 – 2014-08-04 (×4): 100 mg via ORAL
  Filled 2014-07-31 (×6): qty 1

## 2014-07-31 MED ORDER — DEXTROSE 5 % IV SOLN
1.0000 g | Freq: Once | INTRAVENOUS | Status: AC
  Administered 2014-07-31: 1 g via INTRAVENOUS
  Filled 2014-07-31: qty 10

## 2014-07-31 MED ORDER — CETYLPYRIDINIUM CHLORIDE 0.05 % MT LIQD
7.0000 mL | Freq: Two times a day (BID) | OROMUCOSAL | Status: DC
  Administered 2014-07-31 – 2014-08-04 (×8): 7 mL via OROMUCOSAL

## 2014-07-31 MED ORDER — METHYLPREDNISOLONE SODIUM SUCC 125 MG IJ SOLR
125.0000 mg | Freq: Once | INTRAMUSCULAR | Status: AC
  Administered 2014-07-31: 125 mg via INTRAVENOUS
  Filled 2014-07-31: qty 2

## 2014-07-31 MED ORDER — INSULIN ASPART 100 UNIT/ML ~~LOC~~ SOLN
0.0000 [IU] | Freq: Every day | SUBCUTANEOUS | Status: DC
  Administered 2014-07-31 – 2014-08-03 (×3): 2 [IU] via SUBCUTANEOUS

## 2014-07-31 MED ORDER — INFLUENZA VAC SPLIT QUAD 0.5 ML IM SUSY
0.5000 mL | PREFILLED_SYRINGE | INTRAMUSCULAR | Status: AC
  Administered 2014-08-01: 0.5 mL via INTRAMUSCULAR
  Filled 2014-07-31: qty 0.5

## 2014-07-31 MED ORDER — DEXTROSE 5 % IV SOLN
INTRAVENOUS | Status: AC
  Filled 2014-07-31: qty 10

## 2014-07-31 MED ORDER — ASPIRIN EC 81 MG PO TBEC
81.0000 mg | DELAYED_RELEASE_TABLET | Freq: Every day | ORAL | Status: DC
  Administered 2014-08-01 – 2014-08-04 (×4): 81 mg via ORAL
  Filled 2014-07-31 (×5): qty 1

## 2014-07-31 MED ORDER — INSULIN GLARGINE 100 UNIT/ML ~~LOC~~ SOLN
30.0000 [IU] | Freq: Every day | SUBCUTANEOUS | Status: DC
  Administered 2014-07-31 – 2014-08-03 (×4): 30 [IU] via SUBCUTANEOUS
  Filled 2014-07-31 (×5): qty 0.3

## 2014-07-31 MED ORDER — HYDRALAZINE HCL 25 MG PO TABS
50.0000 mg | ORAL_TABLET | Freq: Two times a day (BID) | ORAL | Status: DC
  Administered 2014-07-31 – 2014-08-04 (×8): 50 mg via ORAL
  Filled 2014-07-31 (×2): qty 1
  Filled 2014-07-31 (×2): qty 2
  Filled 2014-07-31: qty 1
  Filled 2014-07-31: qty 2
  Filled 2014-07-31 (×2): qty 1
  Filled 2014-07-31 (×2): qty 2
  Filled 2014-07-31: qty 1
  Filled 2014-07-31: qty 2
  Filled 2014-07-31: qty 1
  Filled 2014-07-31 (×2): qty 2
  Filled 2014-07-31: qty 1

## 2014-07-31 MED ORDER — DEXTROSE 5 % IV SOLN
500.0000 mg | INTRAVENOUS | Status: DC
  Administered 2014-08-01 – 2014-08-03 (×3): 500 mg via INTRAVENOUS
  Filled 2014-07-31 (×4): qty 500

## 2014-07-31 MED ORDER — PNEUMOCOCCAL VAC POLYVALENT 25 MCG/0.5ML IJ INJ
0.5000 mL | INJECTION | INTRAMUSCULAR | Status: AC
  Administered 2014-08-01: 0.5 mL via INTRAMUSCULAR
  Filled 2014-07-31: qty 0.5

## 2014-07-31 MED ORDER — ALBUTEROL SULFATE (2.5 MG/3ML) 0.083% IN NEBU
2.5000 mg | INHALATION_SOLUTION | RESPIRATORY_TRACT | Status: DC | PRN
  Administered 2014-08-02: 2.5 mg via RESPIRATORY_TRACT
  Filled 2014-07-31: qty 3

## 2014-07-31 MED ORDER — DEXTROSE 5 % IV SOLN
1.0000 g | INTRAVENOUS | Status: DC
  Administered 2014-08-01 – 2014-08-03 (×3): 1 g via INTRAVENOUS
  Filled 2014-07-31 (×4): qty 10

## 2014-07-31 MED ORDER — ENOXAPARIN SODIUM 40 MG/0.4ML ~~LOC~~ SOLN
40.0000 mg | SUBCUTANEOUS | Status: DC
  Administered 2014-07-31 – 2014-08-03 (×4): 40 mg via SUBCUTANEOUS
  Filled 2014-07-31 (×4): qty 0.4

## 2014-07-31 MED ORDER — DEXTROSE 5 % IV SOLN
INTRAVENOUS | Status: AC
  Filled 2014-07-31: qty 500

## 2014-07-31 MED ORDER — ACETAMINOPHEN 325 MG PO TABS
650.0000 mg | ORAL_TABLET | Freq: Once | ORAL | Status: AC
  Administered 2014-07-31: 650 mg via ORAL
  Filled 2014-07-31: qty 2

## 2014-07-31 NOTE — ED Provider Notes (Signed)
CSN: [id number]     Arrival date & time 07/31/14  8250 History  This chart was scribed for Shaune Pollack, MD by Jeanell Sparrow, ED Scribe. This patient was seen in room APA04/APA04 and the patient's care was started at 7:29 PM.  Chief Complaint  Patient presents with  . Shortness of Breath   Patient is a 63 y.o. male presenting with shortness of breath. The history is provided by the patient. No language interpreter was used.  Shortness of Breath Severity:  Moderate Onset quality:  Sudden Duration:  4 hours Timing:  Intermittent Progression:  Unchanged Chronicity:  New Relieved by:  None tried Worsened by:  Nothing tried Ineffective treatments:  None tried Associated symptoms: cough and fever     HPI Comments: Vincent Ramos is a 63 y.o. male who presents to the Emergency Department complaining of moderate intermittent SOB that started today. He reports that he has been sick for about a week. He states that it started off with diarrhea that is now resolved, a cough productive of green and white sputum, and SOB that started today. Pt's temperature in the ED today is 102.4. He reports that he has a hx of DM and HTN. He denies any hx of smoking or asthma.   Past Medical History  Diagnosis Date  . Diabetes mellitus   . Hypertension   . Shortness of breath    Past Surgical History  Procedure Laterality Date  . Laceration repair      left hand  . Circumcision    . Hydrocele excision  10/14/2011    Procedure: HYDROCELECTOMY ADULT;  Surgeon: Malka So, MD;  Location: [facility];  Service: Urology;  Laterality: Right;   Family History  Problem Relation Age of Onset  . Anesthesia problems Neg Hx   . Hypotension Neg Hx   . Malignant hyperthermia Neg Hx   . Pseudochol deficiency Neg Hx    History  Substance Use Topics  . Smoking status: Never Smoker   . Smokeless tobacco: Not on file  . Alcohol Use: Yes     Comment: occassional    Review of Systems  Constitutional: Positive  for fever.  Respiratory: Positive for cough and shortness of breath.   Gastrointestinal: Positive for diarrhea (resolved).  All other systems reviewed and are negative.   Allergies  Review of patient's allergies indicates no known allergies.  Home Medications   Prior to Admission medications   Medication Sig Start Date End Date Taking? Authorizing Provider  amLODipine (NORVASC) 10 MG tablet Take 5 mg by mouth daily.    Yes Historical Provider, MD  aspirin EC 81 MG tablet Take 81 mg by mouth daily.   Yes Historical Provider, MD  Canagliflozin (INVOKANA PO) Take 100 mg by mouth daily.   Yes Historical Provider, MD  enalapril (VASOTEC) 20 MG tablet Take 40 mg by mouth daily. 07/08/14  Yes Historical Provider, MD  hydrALAZINE (APRESOLINE) 50 MG tablet Take 50 mg by mouth 2 (two) times daily. 06/13/14  Yes Historical Provider, MD  insulin glargine (LANTUS) 100 UNIT/ML injection Inject 30 Units into the skin at bedtime.    Yes Historical Provider, MD  potassium chloride SA (K-DUR,KLOR-CON) 20 MEQ tablet Take 1 tablet (20 mEq total) by mouth daily. Patient needs appointment for future refills. 02/04/14  Yes Lorretta Harp, MD   BP 213/91 mmHg  Pulse 114  Temp(Src) 102.4 F (39.1 C) (Oral)  Resp 21  Ht 5\' 6"  (1.676 m)  Wt  260 lb (117.935 kg)  BMI 41.99 kg/m2  SpO2 92% Physical Exam  Constitutional: He is oriented to person, place, and time. He appears well-developed and well-nourished.  HENT:  Head: Normocephalic and atraumatic.  Right Ear: External ear normal.  Left Ear: External ear normal.  Nose: Nose normal.  Mouth/Throat: Oropharynx is clear and moist.  Eyes: Conjunctivae and EOM are normal. Pupils are equal, round, and reactive to light.  Neck: Normal range of motion. Neck supple.  Cardiovascular: Normal rate, regular rhythm, normal heart sounds and intact distal pulses.   Pulmonary/Chest: Effort normal and breath sounds normal. No respiratory distress. He has no wheezes. He  exhibits no tenderness.  Decreased bs some expiratory wheezes  Abdominal: Soft. Bowel sounds are normal. He exhibits no distension and no mass. There is no tenderness. There is no guarding.  Musculoskeletal: Normal range of motion.  Neurological: He is alert and oriented to person, place, and time. He has normal reflexes. He exhibits normal muscle tone. Coordination normal.  Skin: Skin is warm and dry.  Psychiatric: He has a normal mood and affect. His behavior is normal. Judgment and thought content normal.  Nursing note and vitals reviewed.   ED Course  Procedures (including critical care time) DIAGNOSTIC STUDIES: Oxygen Saturation is 92% on RA, hypoxic by my interpretation.    COORDINATION OF CARE: 7:33 PM- Pt advised of plan for treatment which includes radiology and labs and pt agrees.  8:29 PM- Pt reports improvement with breathing treatment. Pt's pulse ox% is 90-92 currently. Told pt that he needs to be admitted and discussed lab results with pt. Pt agrees with treatment plan.   Labs Review Labs Reviewed  BASIC METABOLIC PANEL - Abnormal; Notable for the following:    Glucose, Bld 243 (*)    Creatinine, Ser 1.38 (*)    GFR calc non Af Amer 54 (*)    GFR calc Af Amer 62 (*)    All other components within normal limits  CBC - Abnormal; Notable for the following:    MCH 25.6 (*)    All other components within normal limits  CULTURE, BLOOD (ROUTINE X 2)  CULTURE, BLOOD (ROUTINE X 2)  TROPONIN I  BRAIN NATRIURETIC PEPTIDE  LACTIC ACID, PLASMA    Imaging Review Dg Chest Port 1 View  07/31/2014   CLINICAL DATA:  Initial encounter for cough was shortness of breath and congestion for 2 days.  EXAM: PORTABLE CHEST - 1 VIEW  COMPARISON:  11/27/2012  FINDINGS: 1925 hrs stable asymmetric elevation of the right hemidiaphragm on this low volume film. Vascular congestion without edema. No focal airspace consolidation, pleural effusion, or pneumothorax. Cardiopericardial silhouette is  at upper limits of normal for size. Imaged bony structures of the thorax are intact. Telemetry leads overlie the chest.  IMPRESSION: Stable.  No acute cardiopulmonary findings.   Electronically Signed   By: Misty Stanley M.D.   On: 07/31/2014 19:39     EKG Interpretation   Date/Time:  Thursday July 31 2014 18:54:33 EST Ventricular Rate:  114 PR Interval:  167 QRS Duration: 89 QT Interval:  308 QTC Calculation: 424 R Axis:   51 Text Interpretation:  Sinus tachycardia Non-specific ST-t changes  Confirmed by Delainee Tramel MD, Andee Poles [phone]) on 07/31/2014 7:37:26 PM      MDM   Final diagnoses:  HCAP (healthcare-associated pneumonia)  Type 2 diabetes mellitus without complication  Chest pain, unspecified chest pain type    63 year old male with infectious symptoms for 1 week now  with cough and dyspnea. He presented with fever to 102.4 and tachycardia. He has defervesced here with Tylenol. His received 1 L of normal saline and her remains mildly tachycardic. Chest x-Malli Falotico does not show any definite infiltrate but this may develop with IV fluids. He is being treated clinically for suspicion of pneumonia with Rocephin and Zithromax. Plan is admission for further IV fluids and treatment.  1- cap- fever, cough, tachycardia and oxygen saturations 80-90% on room air with patient with multiple comorbidities 2 chest pain- patient has complained of some intermittent chest tightness. EKG does not show any acute ischemia and initial troponin is normal.   Shaune Pollack, MD 07/31/14 2107

## 2014-07-31 NOTE — H&P (Signed)
[facility] History and Physical  Raed Schalk Gross ZOX:[id number] DOB: [birth year] DOA: 07/31/2014  Referring physician: ER PCP: Rosita Fire, MD   Chief Complaint: Dyspnea, cough.  HPI: Vincent Ramos is a 63 y.o. male  This is a 63 year old man who has diabetes and hypertension and now presents with a week history of being unwell. Initially, he started to have diarrhea without vomiting but in the last few days he has been dyspneic at rest with wheezing and also cough productive of yellow sputum. He has been febrile at home and when he was evaluated in the emergency room, he had a temperature of 102.4. He has been feeling weak. His grandson had been sick with similar symptoms approximately 2 weeks ago. He is a nonsmoker. He has not taken influenza or pneumococcal vaccination. Evaluation in the emergency room showed him to be hypoxemic with saturations in the 70s on room air. He is now being admitted for further management.  Review of Systems:  Apart from symptoms above, all other systems negative.  Past Medical History  Diagnosis Date  . Diabetes mellitus   . Hypertension   . Shortness of breath    Past Surgical History  Procedure Laterality Date  . Laceration repair      left hand  . Circumcision    . Hydrocele excision  10/14/2011    Procedure: HYDROCELECTOMY ADULT;  Surgeon: Malka So, MD;  Location: [facility];  Service: Urology;  Laterality: Right;   Social History:  reports that he has never smoked. He does not have any smokeless tobacco history on file. He reports that he drinks alcohol. He reports that he does not use illicit drugs.  No Known Allergies  Family History  Problem Relation Age of Onset  . Anesthesia problems Neg Hx   . Hypotension Neg Hx   . Malignant hyperthermia Neg Hx   . Pseudochol deficiency Neg Hx      Prior to Admission medications   Medication Sig Start Date End Date Taking? Authorizing Provider  amLODipine (NORVASC) 10 MG tablet Take  5 mg by mouth daily.    Yes Historical Provider, MD  aspirin EC 81 MG tablet Take 81 mg by mouth daily.   Yes Historical Provider, MD  Canagliflozin (INVOKANA PO) Take 100 mg by mouth daily.   Yes Historical Provider, MD  enalapril (VASOTEC) 20 MG tablet Take 40 mg by mouth daily. 07/08/14  Yes Historical Provider, MD  hydrALAZINE (APRESOLINE) 50 MG tablet Take 50 mg by mouth 2 (two) times daily. 06/13/14  Yes Historical Provider, MD  insulin glargine (LANTUS) 100 UNIT/ML injection Inject 30 Units into the skin at bedtime.    Yes Historical Provider, MD  potassium chloride SA (K-DUR,KLOR-CON) 20 MEQ tablet Take 1 tablet (20 mEq total) by mouth daily. Patient needs appointment for future refills. 02/04/14  Yes Lorretta Harp, MD   Physical Exam: Filed Vitals:   07/31/14 2000 07/31/14 2030 07/31/14 2036 07/31/14 2100  BP: 177/83 150/71 150/71 126/97  Pulse: 102 125 112 107  Temp:   99.2 F (37.3 C)   TempSrc:   Oral   Resp: 16 18 20 21   Height:      Weight:      SpO2: 97% 89% 92% 96%    Wt Readings from Last 3 Encounters:  07/31/14 117.935 kg (260 lb)  05/28/13 124.739 kg (275 lb)  07/29/12 111.131 kg (245 lb)    General:  Appears febrile at rest. He is not clinically toxic. Currently,  he is not dyspneic at rest, this is after bronchodilators being given. Eyes: PERRL, normal lids, irises & conjunctiva ENT: grossly normal hearing, lips & tongue Neck: no LAD, masses or thyromegaly Cardiovascular: RRR, no m/r/g. No LE edema. Telemetry: Sinus tachycardia. Respiratory: CTA bilaterally, no w/r/r. Normal respiratory effort. Currently, he does not have wheezing as he had previously in the emergency room. There is no bronchial breathing or crackles. Abdomen: soft, ntnd Skin: no rash or induration seen on limited exam Musculoskeletal: grossly normal tone BUE/BLE Psychiatric: grossly normal mood and affect, speech fluent and appropriate Neurologic: grossly non-focal.          Labs on  Admission:  Basic Metabolic Panel:  Recent Labs Lab 07/31/14 1912  NA 136  K 3.6  CL 102  CO2 28  GLUCOSE 243*  BUN 12  CREATININE 1.38*  CALCIUM 8.7   Liver Function Tests: No results for input(s): AST, ALT, ALKPHOS, BILITOT, PROT, ALBUMIN in the last 168 hours. No results for input(s): LIPASE, AMYLASE in the last 168 hours. No results for input(s): AMMONIA in the last 168 hours. CBC:  Recent Labs Lab 07/31/14 1912  WBC 6.0  HGB 13.5  HCT 43.4  MCV 82.4  PLT 150   Cardiac Enzymes:  Recent Labs Lab 07/31/14 1912  TROPONINI 0.03    BNP (last 3 results) No results for input(s): PROBNP in the last 8760 hours. CBG: No results for input(s): GLUCAP in the last 168 hours.  Radiological Exams on Admission: Dg Chest Port 1 View  07/31/2014   CLINICAL DATA:  Initial encounter for cough was shortness of breath and congestion for 2 days.  EXAM: PORTABLE CHEST - 1 VIEW  COMPARISON:  11/27/2012  FINDINGS: 1925 hrs stable asymmetric elevation of the right hemidiaphragm on this low volume film. Vascular congestion without edema. No focal airspace consolidation, pleural effusion, or pneumothorax. Cardiopericardial silhouette is at upper limits of normal for size. Imaged bony structures of the thorax are intact. Telemetry leads overlie the chest.  IMPRESSION: Stable.  No acute cardiopulmonary findings.   Electronically Signed   By: Misty Stanley M.D.   On: 07/31/2014 19:39    EKG: Independently reviewed. Normal sinus rhythm without any acute ST-T wave changes.  Assessment/Plan   1. Possible community-acquired pneumonia-chest x-ray is not suggestive and white count is not elevated. However his symptoms do seem to fit with a pneumonic process or at the very least a viral respiratory process. In view of his diabetes, we will treat this as community-acquired pneumonia with antibiotics. Will check influenza panel. 2. Respiratory distress-he appears to be improved now in the emergency  room but I will prescribe bronchodilators as necessary and supplemental oxygen as necessary. 3. Diabetes-continue with home medications and sliding scale of insulin. 4. Hypertension-appears to be stabilizing and will continue with home medications.  Further recommendations will depend on patient's hospital progress.   Code Status: Full code  DVT Prophylaxis: Lovenox  Family Communication: I discussed the plan with the patient at the bedside.  Disposition Plan: Home when medically stable.  Time spent: 60 minutes  [facility] Pager [phone].

## 2014-07-31 NOTE — ED Notes (Signed)
Patient states he started with a cough and chest pain 2 days ago, with NVD. Patient states productive cough that is "white and green" patient A&OX4

## 2014-08-01 ENCOUNTER — Inpatient Hospital Stay (HOSPITAL_COMMUNITY): Payer: BLUE CROSS/BLUE SHIELD

## 2014-08-01 LAB — COMPREHENSIVE METABOLIC PANEL
ALT: 27 U/L (ref 0–53)
ANION GAP: 6 (ref 5–15)
AST: 23 U/L (ref 0–37)
Albumin: 3.6 g/dL (ref 3.5–5.2)
Alkaline Phosphatase: 73 U/L (ref 39–117)
BUN: 18 mg/dL (ref 6–23)
CALCIUM: 8.7 mg/dL (ref 8.4–10.5)
CO2: 26 mmol/L (ref 19–32)
Chloride: 110 mEq/L (ref 96–112)
Creatinine, Ser: 1.23 mg/dL (ref 0.50–1.35)
GFR calc non Af Amer: 62 mL/min — ABNORMAL LOW (ref >90)
GFR, EST AFRICAN AMERICAN: 72 mL/min — AB (ref >90)
GLUCOSE: 267 mg/dL — AB (ref 70–99)
Potassium: 4.2 mmol/L (ref 3.5–5.1)
SODIUM: 142 mmol/L (ref 135–145)
Total Bilirubin: 0.6 mg/dL (ref 0.3–1.2)
Total Protein: 7 g/dL (ref 6.0–8.3)

## 2014-08-01 LAB — STREP PNEUMONIAE URINARY ANTIGEN: STREP PNEUMO URINARY ANTIGEN: NEGATIVE

## 2014-08-01 LAB — CBC
HCT: 45 % (ref 39.0–52.0)
Hemoglobin: 13.9 g/dL (ref 13.0–17.0)
MCH: 25.7 pg — ABNORMAL LOW (ref 26.0–34.0)
MCHC: 30.9 g/dL (ref 30.0–36.0)
MCV: 83.3 fL (ref 78.0–100.0)
PLATELETS: 138 10*3/uL — AB (ref 150–400)
RBC: 5.4 MIL/uL (ref 4.22–5.81)
RDW: 15.5 % (ref 11.5–15.5)
WBC: 4.5 10*3/uL (ref 4.0–10.5)

## 2014-08-01 LAB — EXPECTORATED SPUTUM ASSESSMENT W GRAM STAIN, RFLX TO RESP C

## 2014-08-01 LAB — GLUCOSE, CAPILLARY
GLUCOSE-CAPILLARY: 212 mg/dL — AB (ref 70–99)
GLUCOSE-CAPILLARY: 228 mg/dL — AB (ref 70–99)
GLUCOSE-CAPILLARY: 293 mg/dL — AB (ref 70–99)
GLUCOSE-CAPILLARY: 219 mg/dL — AB (ref 70–99)

## 2014-08-01 LAB — EXPECTORATED SPUTUM ASSESSMENT W REFEX TO RESP CULTURE

## 2014-08-01 MED ORDER — ENALAPRIL MALEATE 5 MG PO TABS
20.0000 mg | ORAL_TABLET | Freq: Two times a day (BID) | ORAL | Status: DC
  Administered 2014-08-01 – 2014-08-04 (×7): 20 mg via ORAL
  Filled 2014-08-01 (×6): qty 4

## 2014-08-01 MED ORDER — HYDROCODONE-ACETAMINOPHEN 5-325 MG PO TABS
1.0000 | ORAL_TABLET | Freq: Four times a day (QID) | ORAL | Status: DC | PRN
  Administered 2014-08-01: 2 via ORAL
  Administered 2014-08-02: 1 via ORAL
  Administered 2014-08-02 (×2): 2 via ORAL
  Administered 2014-08-03: 1 via ORAL
  Administered 2014-08-04: 2 via ORAL
  Administered 2014-08-04: 1 via ORAL
  Filled 2014-08-01: qty 1
  Filled 2014-08-01: qty 2
  Filled 2014-08-01: qty 1
  Filled 2014-08-01: qty 2
  Filled 2014-08-01: qty 1
  Filled 2014-08-01: qty 2
  Filled 2014-08-01: qty 1
  Filled 2014-08-01: qty 2

## 2014-08-01 MED ORDER — AMLODIPINE BESYLATE 5 MG PO TABS: 10.0000 mg | ORAL_TABLET | Freq: Once | ORAL | Status: DC

## 2014-08-01 MED ORDER — AMLODIPINE BESYLATE 5 MG PO TABS
10.0000 mg | ORAL_TABLET | Freq: Every day | ORAL | Status: DC
  Administered 2014-08-01 – 2014-08-04 (×4): 10 mg via ORAL
  Filled 2014-08-01 (×4): qty 2

## 2014-08-01 NOTE — Progress Notes (Signed)
Subjective: Patient was admitted yesterday due to cough and congestion as xcae of pneumonia. He feels better today. No fever or chills.  Objective: Vital signs in last 24 hours: Temp:  [99 F (37.2 C)-102.4 F (39.1 C)] 99 F (37.2 C) (01/01 0540) Pulse Rate:  [91-125] 91 (12/31 2227) Resp:  [16-27] 20 (12/31 2227) BP: (126-213)/(56-97) 182/88 mmHg (01/01 0825) SpO2:  [89 %-99 %] 97 % (12/31 2227) FiO2 (%):  [28 %] 28 % (12/31 2133) Weight:  [117.935 kg (260 lb)] 117.935 kg (260 lb) (12/31 1857) Weight change:  Last BM Date: 08/01/14  Intake/Output from previous day: 12/31 0701 - 01/01 0700 In: 606.7 [I.V.:606.7] Out: 1700 [Urine:1700]  PHYSICAL EXAM General appearance: alert, no distress and moderately obese Resp: diminished breath sounds bilaterally and rhonchi bilaterally Cardio: regular rate and rhythm GI: soft, non-tender; bowel sounds normal; no masses,  no organomegaly Extremities: extremities normal, atraumatic, no cyanosis or edema  Lab Results:  Results for orders placed or performed during the hospital encounter of 07/31/14 (from the past 48 hour(s))  Basic metabolic panel     Status: Abnormal   Collection Time: 07/31/14  7:12 PM  Result Value Ref Range   Sodium 136 135 - 145 mmol/L    Comment: Please note change in reference range.   Potassium 3.6 3.5 - 5.1 mmol/L    Comment: Please note change in reference range.   Chloride 102 96 - 112 mEq/L   CO2 28 19 - 32 mmol/L   Glucose, Bld 243 (H) 70 - 99 mg/dL   BUN 12 6 - 23 mg/dL   Creatinine, Ser 1.38 (H) 0.50 - 1.35 mg/dL   Calcium 8.7 8.4 - 10.5 mg/dL   GFR calc non Af Amer 54 (L) >90 mL/min   GFR calc Af Amer 62 (L) >90 mL/min    Comment: (NOTE) The eGFR has been calculated using the CKD EPI equation. This calculation has not been validated in all clinical situations. eGFR's persistently <90 mL/min signify possible Chronic Kidney Disease.    Anion gap 6 5 - 15  CBC     Status: Abnormal   Collection  Time: 07/31/14  7:12 PM  Result Value Ref Range   WBC 6.0 4.0 - 10.5 K/uL   RBC 5.27 4.22 - 5.81 MIL/uL   Hemoglobin 13.5 13.0 - 17.0 g/dL   HCT 43.4 39.0 - 52.0 %   MCV 82.4 78.0 - 100.0 fL   MCH 25.6 (L) 26.0 - 34.0 pg   MCHC 31.1 30.0 - 36.0 g/dL   RDW 15.3 11.5 - 15.5 %   Platelets 150 150 - 400 K/uL    Comment: RESULT REPEATED AND VERIFIED SPECIMEN CHECKED FOR CLOTS PLATELETS APPEAR ADEQUATE LARGE PLATELETS PRESENT SMEAR STAINED AND AVAILABLE FOR REVIEW   BNP (order ONLY if patient complains of dyspnea/SOB AND you have documented it for THIS visit)     Status: None   Collection Time: 07/31/14  7:12 PM  Result Value Ref Range   B Natriuretic Peptide 33.0 0.0 - 100.0 pg/mL    Comment: Please note change in reference range.  Troponin I (MHP)     Status: None   Collection Time: 07/31/14  7:12 PM  Result Value Ref Range   Troponin I 0.03 <0.031 ng/mL    Comment:        NO INDICATION OF MYOCARDIAL INJURY. Please note change in reference range.   Lactic acid, plasma     Status: None   Collection Time: 07/31/14  8:11 PM  Result Value Ref Range   Lactic Acid, Venous 1.1 0.5 - 2.2 mmol/L  I-Stat CG4 Lactic Acid, ED     Status: None   Collection Time: 07/31/14  8:35 PM  Result Value Ref Range   Lactic Acid, Venous 1.78 0.5 - 2.2 mmol/L  Influenza panel by pcr     Status: None   Collection Time: 07/31/14  8:39 PM  Result Value Ref Range   Influenza A By PCR NEGATIVE NEGATIVE   Influenza B By PCR NEGATIVE NEGATIVE   H1N1 flu by pcr NOT DETECTED NOT DETECTED    Comment:        The Xpert Flu assay (FDA approved for nasal aspirates or washes and nasopharyngeal swab specimens), is intended as an aid in the diagnosis of influenza and should not be used as a sole basis for treatment.   Glucose, capillary     Status: Abnormal   Collection Time: 07/31/14 10:25 PM  Result Value Ref Range   Glucose-Capillary 248 (H) 70 - 99 mg/dL  Culture, sputum-assessment     Status: None    Collection Time: 07/31/14 11:31 PM  Result Value Ref Range   Specimen Description SPUTUM    Special Requests NONE    Sputum evaluation      THIS SPECIMEN IS ACCEPTABLE. RESPIRATORY CULTURE REPORT TO FOLLOW.   Report Status 08/01/2014 FINAL   Comprehensive metabolic panel     Status: Abnormal   Collection Time: 08/01/14  5:55 AM  Result Value Ref Range   Sodium 142 135 - 145 mmol/L    Comment: Please note change in reference range.   Potassium 4.2 3.5 - 5.1 mmol/L    Comment: Please note change in reference range.   Chloride 110 96 - 112 mEq/L   CO2 26 19 - 32 mmol/L   Glucose, Bld 267 (H) 70 - 99 mg/dL   BUN 18 6 - 23 mg/dL   Creatinine, Ser 1.23 0.50 - 1.35 mg/dL   Calcium 8.7 8.4 - 10.5 mg/dL   Total Protein 7.0 6.0 - 8.3 g/dL   Albumin 3.6 3.5 - 5.2 g/dL   AST 23 0 - 37 U/L   ALT 27 0 - 53 U/L   Alkaline Phosphatase 73 39 - 117 U/L   Total Bilirubin 0.6 0.3 - 1.2 mg/dL   GFR calc non Af Amer 62 (L) >90 mL/min   GFR calc Af Amer 72 (L) >90 mL/min    Comment: (NOTE) The eGFR has been calculated using the CKD EPI equation. This calculation has not been validated in all clinical situations. eGFR's persistently <90 mL/min signify possible Chronic Kidney Disease.    Anion gap 6 5 - 15  CBC     Status: Abnormal   Collection Time: 08/01/14  5:55 AM  Result Value Ref Range   WBC 4.5 4.0 - 10.5 K/uL   RBC 5.40 4.22 - 5.81 MIL/uL   Hemoglobin 13.9 13.0 - 17.0 g/dL   HCT 45.0 39.0 - 52.0 %   MCV 83.3 78.0 - 100.0 fL   MCH 25.7 (L) 26.0 - 34.0 pg   MCHC 30.9 30.0 - 36.0 g/dL   RDW 15.5 11.5 - 15.5 %   Platelets 138 (L) 150 - 400 K/uL    Comment: SPECIMEN CHECKED FOR CLOTS CONSISTENT WITH PREVIOUS RESULT   Glucose, capillary     Status: Abnormal   Collection Time: 08/01/14  7:25 AM  Result Value Ref Range   Glucose-Capillary 212 (H) 70 -  99 mg/dL  Glucose, capillary     Status: Abnormal   Collection Time: 08/01/14 11:34 AM  Result Value Ref Range   Glucose-Capillary 228  (H) 70 - 99 mg/dL    ABGS No results for input(s): PHART, PO2ART, TCO2, HCO3 in the last 72 hours.  Invalid input(s): PCO2 CULTURES Recent Results (from the past 240 hour(s))  Culture, sputum-assessment     Status: None   Collection Time: 07/31/14 11:31 PM  Result Value Ref Range Status   Specimen Description SPUTUM  Final   Special Requests NONE  Final   Sputum evaluation   Final    THIS SPECIMEN IS ACCEPTABLE. RESPIRATORY CULTURE REPORT TO FOLLOW.   Report Status 08/01/2014 FINAL  Final   Studies/Results: Dg Chest 2 View  08/01/2014   CLINICAL DATA:  Pneumonia, hypertension  EXAM: CHEST  2 VIEW  COMPARISON:  July 31, 2014  FINDINGS: The heart size and mediastinal contours are stable. The heart size is enlarged. There is mild patchy opacity of lateral left lung base. The right hemidiaphragm is elevated, chronic. There is no pulmonary edema or pleural effusion. The visualized skeletal structures are stable.  IMPRESSION: Mild patchy opacity of lateral left lung base suggesting early pneumonia.   Electronically Signed   By: Abelardo Diesel M.D.   On: 08/01/2014 08:23   Dg Chest Port 1 View  07/31/2014   CLINICAL DATA:  Initial encounter for cough was shortness of breath and congestion for 2 days.  EXAM: PORTABLE CHEST - 1 VIEW  COMPARISON:  11/27/2012  FINDINGS: 1925 hrs stable asymmetric elevation of the right hemidiaphragm on this low volume film. Vascular congestion without edema. No focal airspace consolidation, pleural effusion, or pneumothorax. Cardiopericardial silhouette is at upper limits of normal for size. Imaged bony structures of the thorax are intact. Telemetry leads overlie the chest.  IMPRESSION: Stable.  No acute cardiopulmonary findings.   Electronically Signed   By: Misty Stanley M.D.   On: 07/31/2014 19:39    Medications: I have reviewed the patient's current medications.  Assesment:   Active Problems:   CAP (community acquired pneumonia)   Diabetes    Hypertension   Obesity   Community acquired pneumonia    Plan:   medications reviewed Will continue iv antibiotics Continue regular treatment BMP/CBC in Am.    LOS: 1 day   Vincent Ramos 08/01/2014, 12:41 PM

## 2014-08-01 NOTE — Progress Notes (Signed)
Pt's heart monitor temporarily removed today so that he could wash up and shave. Thus far, pt has had no c/o pain or distress. Wife has been in his room all morning/afternoon. Will continue to monitor

## 2014-08-01 NOTE — Progress Notes (Addendum)
Patient's BP is 182/92. I paged the MD and he ordered for the morning dose of amlodipine to be increased from 5 to 10 mg daily.

## 2014-08-02 LAB — BASIC METABOLIC PANEL
Anion gap: 6 (ref 5–15)
BUN: 17 mg/dL (ref 6–23)
CALCIUM: 8.2 mg/dL — AB (ref 8.4–10.5)
CHLORIDE: 103 meq/L (ref 96–112)
CO2: 29 mmol/L (ref 19–32)
Creatinine, Ser: 1.09 mg/dL (ref 0.50–1.35)
GFR calc Af Amer: 83 mL/min — ABNORMAL LOW (ref >90)
GFR calc non Af Amer: 71 mL/min — ABNORMAL LOW (ref >90)
Glucose, Bld: 98 mg/dL (ref 70–99)
Potassium: 3.6 mmol/L (ref 3.5–5.1)
Sodium: 138 mmol/L (ref 135–145)

## 2014-08-02 LAB — GLUCOSE, CAPILLARY
GLUCOSE-CAPILLARY: 183 mg/dL — AB (ref 70–99)
GLUCOSE-CAPILLARY: 140 mg/dL — AB (ref 70–99)
Glucose-Capillary: 110 mg/dL — ABNORMAL HIGH (ref 70–99)
Glucose-Capillary: 193 mg/dL — ABNORMAL HIGH (ref 70–99)

## 2014-08-02 LAB — CBC
HEMATOCRIT: 44.8 % (ref 39.0–52.0)
HEMOGLOBIN: 14.2 g/dL (ref 13.0–17.0)
MCH: 26.5 pg (ref 26.0–34.0)
MCHC: 31.7 g/dL (ref 30.0–36.0)
MCV: 83.6 fL (ref 78.0–100.0)
Platelets: 137 10*3/uL — ABNORMAL LOW (ref 150–400)
RBC: 5.36 MIL/uL (ref 4.22–5.81)
RDW: 15.4 % (ref 11.5–15.5)
WBC: 7.2 10*3/uL (ref 4.0–10.5)

## 2014-08-02 NOTE — Progress Notes (Signed)
Subjective: Patient feels better. His cough and congestion is gradually improving.  Objective: Vital signs in last 24 hours: Temp:  [98 F (36.7 C)-98.9 F (37.2 C)] 98.4 F (36.9 C) (01/02 0621) Pulse Rate:  [81-86] 81 (01/02 0621) Resp:  [20] 20 (01/02 0621) BP: (154-172)/(79-84) 154/79 mmHg (01/02 0621) SpO2:  [96 %-99 %] 96 % (01/02 0938) Weight change:  Last BM Date: 08/02/14  Intake/Output from previous day: 01/01 0701 - 01/02 0700 In: 960 [P.O.:960] Out: 2750 [Urine:2750]  PHYSICAL EXAM General appearance: alert, no distress and moderately obese Resp: diminished breath sounds bilaterally and rhonchi bilaterally Cardio: regular rate and rhythm GI: soft, non-tender; bowel sounds normal; no masses,  no organomegaly Extremities: extremities normal, atraumatic, no cyanosis or edema  Lab Results:  Results for orders placed or performed during the hospital encounter of 07/31/14 (from the past 48 hour(s))  Basic metabolic panel     Status: Abnormal   Collection Time: 07/31/14  7:12 PM  Result Value Ref Range   Sodium 136 135 - 145 mmol/L    Comment: Please note change in reference range.   Potassium 3.6 3.5 - 5.1 mmol/L    Comment: Please note change in reference range.   Chloride 102 96 - 112 mEq/L   CO2 28 19 - 32 mmol/L   Glucose, Bld 243 (H) 70 - 99 mg/dL   BUN 12 6 - 23 mg/dL   Creatinine, Ser 1.38 (H) 0.50 - 1.35 mg/dL   Calcium 8.7 8.4 - 10.5 mg/dL   GFR calc non Af Amer 54 (L) >90 mL/min   GFR calc Af Amer 62 (L) >90 mL/min    Comment: (NOTE) The eGFR has been calculated using the CKD EPI equation. This calculation has not been validated in all clinical situations. eGFR's persistently <90 mL/min signify possible Chronic Kidney Disease.    Anion gap 6 5 - 15  CBC     Status: Abnormal   Collection Time: 07/31/14  7:12 PM  Result Value Ref Range   WBC 6.0 4.0 - 10.5 K/uL   RBC 5.27 4.22 - 5.81 MIL/uL   Hemoglobin 13.5 13.0 - 17.0 g/dL   HCT 43.4 39.0 -  52.0 %   MCV 82.4 78.0 - 100.0 fL   MCH 25.6 (L) 26.0 - 34.0 pg   MCHC 31.1 30.0 - 36.0 g/dL   RDW 15.3 11.5 - 15.5 %   Platelets 150 150 - 400 K/uL    Comment: RESULT REPEATED AND VERIFIED SPECIMEN CHECKED FOR CLOTS PLATELETS APPEAR ADEQUATE LARGE PLATELETS PRESENT SMEAR STAINED AND AVAILABLE FOR REVIEW   BNP (order ONLY if patient complains of dyspnea/SOB AND you have documented it for THIS visit)     Status: None   Collection Time: 07/31/14  7:12 PM  Result Value Ref Range   B Natriuretic Peptide 33.0 0.0 - 100.0 pg/mL    Comment: Please note change in reference range.  Troponin I (MHP)     Status: None   Collection Time: 07/31/14  7:12 PM  Result Value Ref Range   Troponin I 0.03 <0.031 ng/mL    Comment:        NO INDICATION OF MYOCARDIAL INJURY. Please note change in reference range.   Lactic acid, plasma     Status: None   Collection Time: 07/31/14  8:11 PM  Result Value Ref Range   Lactic Acid, Venous 1.1 0.5 - 2.2 mmol/L  Blood culture (routine x 2)     Status: None (Preliminary result)  Collection Time: 07/31/14  8:11 PM  Result Value Ref Range   Specimen Description BLOOD RIGHT ANTECUBITAL    Special Requests BOTTLES DRAWN AEROBIC AND ANAEROBIC 8CC    Culture NO GROWTH 2 DAYS    Report Status PENDING   Blood culture (routine x 2)     Status: None (Preliminary result)   Collection Time: 07/31/14  8:16 PM  Result Value Ref Range   Specimen Description BLOOD RIGHT ARM    Special Requests BOTTLES DRAWN AEROBIC AND ANAEROBIC 8CC    Culture NO GROWTH 2 DAYS    Report Status PENDING   I-Stat CG4 Lactic Acid, ED     Status: None   Collection Time: 07/31/14  8:35 PM  Result Value Ref Range   Lactic Acid, Venous 1.78 0.5 - 2.2 mmol/L  Influenza panel by pcr     Status: None   Collection Time: 07/31/14  8:39 PM  Result Value Ref Range   Influenza A By PCR NEGATIVE NEGATIVE   Influenza B By PCR NEGATIVE NEGATIVE   H1N1 flu by pcr NOT DETECTED NOT DETECTED     Comment:        The Xpert Flu assay (FDA approved for nasal aspirates or washes and nasopharyngeal swab specimens), is intended as an aid in the diagnosis of influenza and should not be used as a sole basis for treatment.   Glucose, capillary     Status: Abnormal   Collection Time: 07/31/14 10:25 PM  Result Value Ref Range   Glucose-Capillary 248 (H) 70 - 99 mg/dL  Culture, sputum-assessment     Status: None   Collection Time: 07/31/14 11:31 PM  Result Value Ref Range   Specimen Description SPUTUM    Special Requests NONE    Sputum evaluation      THIS SPECIMEN IS ACCEPTABLE. RESPIRATORY CULTURE REPORT TO FOLLOW.   Report Status 08/01/2014 FINAL   Strep pneumoniae urinary antigen     Status: None   Collection Time: 08/01/14 12:30 AM  Result Value Ref Range   Strep Pneumo Urinary Antigen NEGATIVE NEGATIVE    Comment:        Infection due to S. pneumoniae cannot be absolutely ruled out since the antigen present may be below the detection limit of the test. Performed at [facility]   Comprehensive metabolic panel     Status: Abnormal   Collection Time: 08/01/14  5:55 AM  Result Value Ref Range   Sodium 142 135 - 145 mmol/L    Comment: Please note change in reference range.   Potassium 4.2 3.5 - 5.1 mmol/L    Comment: Please note change in reference range.   Chloride 110 96 - 112 mEq/L   CO2 26 19 - 32 mmol/L   Glucose, Bld 267 (H) 70 - 99 mg/dL   BUN 18 6 - 23 mg/dL   Creatinine, Ser 1.23 0.50 - 1.35 mg/dL   Calcium 8.7 8.4 - 10.5 mg/dL   Total Protein 7.0 6.0 - 8.3 g/dL   Albumin 3.6 3.5 - 5.2 g/dL   AST 23 0 - 37 U/L   ALT 27 0 - 53 U/L   Alkaline Phosphatase 73 39 - 117 U/L   Total Bilirubin 0.6 0.3 - 1.2 mg/dL   GFR calc non Af Amer 62 (L) >90 mL/min   GFR calc Af Amer 72 (L) >90 mL/min    Comment: (NOTE) The eGFR has been calculated using the CKD EPI equation. This calculation has not been validated  in all clinical situations. eGFR's persistently <90  mL/min signify possible Chronic Kidney Disease.    Anion gap 6 5 - 15  CBC     Status: Abnormal   Collection Time: 08/01/14  5:55 AM  Result Value Ref Range   WBC 4.5 4.0 - 10.5 K/uL   RBC 5.40 4.22 - 5.81 MIL/uL   Hemoglobin 13.9 13.0 - 17.0 g/dL   HCT 45.0 39.0 - 52.0 %   MCV 83.3 78.0 - 100.0 fL   MCH 25.7 (L) 26.0 - 34.0 pg   MCHC 30.9 30.0 - 36.0 g/dL   RDW 15.5 11.5 - 15.5 %   Platelets 138 (L) 150 - 400 K/uL    Comment: SPECIMEN CHECKED FOR CLOTS CONSISTENT WITH PREVIOUS RESULT   Glucose, capillary     Status: Abnormal   Collection Time: 08/01/14  7:25 AM  Result Value Ref Range   Glucose-Capillary 212 (H) 70 - 99 mg/dL  Glucose, capillary     Status: Abnormal   Collection Time: 08/01/14 11:34 AM  Result Value Ref Range   Glucose-Capillary 228 (H) 70 - 99 mg/dL  Glucose, capillary     Status: Abnormal   Collection Time: 08/01/14  5:51 PM  Result Value Ref Range   Glucose-Capillary 293 (H) 70 - 99 mg/dL   Comment 1 Notify RN    Comment 2 Documented in Chart   Glucose, capillary     Status: Abnormal   Collection Time: 08/01/14  9:09 PM  Result Value Ref Range   Glucose-Capillary 219 (H) 70 - 99 mg/dL  CBC     Status: Abnormal   Collection Time: 08/02/14  6:15 AM  Result Value Ref Range   WBC 7.2 4.0 - 10.5 K/uL   RBC 5.36 4.22 - 5.81 MIL/uL   Hemoglobin 14.2 13.0 - 17.0 g/dL   HCT 44.8 39.0 - 52.0 %   MCV 83.6 78.0 - 100.0 fL   MCH 26.5 26.0 - 34.0 pg   MCHC 31.7 30.0 - 36.0 g/dL   RDW 15.4 11.5 - 15.5 %   Platelets 137 (L) 150 - 400 K/uL    Comment: SPECIMEN CHECKED FOR CLOTS PLATELET COUNT CONFIRMED BY SMEAR   Basic metabolic panel     Status: Abnormal   Collection Time: 08/02/14  6:15 AM  Result Value Ref Range   Sodium 138 135 - 145 mmol/L    Comment: Please note change in reference range.   Potassium 3.6 3.5 - 5.1 mmol/L    Comment: Please note change in reference range.   Chloride 103 96 - 112 mEq/L   CO2 29 19 - 32 mmol/L   Glucose, Bld 98 70 -  99 mg/dL   BUN 17 6 - 23 mg/dL   Creatinine, Ser 1.09 0.50 - 1.35 mg/dL   Calcium 8.2 (L) 8.4 - 10.5 mg/dL   GFR calc non Af Amer 71 (L) >90 mL/min   GFR calc Af Amer 83 (L) >90 mL/min    Comment: (NOTE) The eGFR has been calculated using the CKD EPI equation. This calculation has not been validated in all clinical situations. eGFR's persistently <90 mL/min signify possible Chronic Kidney Disease.    Anion gap 6 5 - 15  Glucose, capillary     Status: Abnormal   Collection Time: 08/02/14  7:36 AM  Result Value Ref Range   Glucose-Capillary 110 (H) 70 - 99 mg/dL   Comment 1 Notify RN    Comment 2 Documented in Chart  ABGS No results for input(s): PHART, PO2ART, TCO2, HCO3 in the last 72 hours.  Invalid input(s): PCO2 CULTURES Recent Results (from the past 240 hour(s))  Blood culture (routine x 2)     Status: None (Preliminary result)   Collection Time: 07/31/14  8:11 PM  Result Value Ref Range Status   Specimen Description BLOOD RIGHT ANTECUBITAL  Final   Special Requests BOTTLES DRAWN AEROBIC AND ANAEROBIC 8CC  Final   Culture NO GROWTH 2 DAYS  Final   Report Status PENDING  Incomplete  Blood culture (routine x 2)     Status: None (Preliminary result)   Collection Time: 07/31/14  8:16 PM  Result Value Ref Range Status   Specimen Description BLOOD RIGHT ARM  Final   Special Requests BOTTLES DRAWN AEROBIC AND ANAEROBIC 8CC  Final   Culture NO GROWTH 2 DAYS  Final   Report Status PENDING  Incomplete  Culture, sputum-assessment     Status: None   Collection Time: 07/31/14 11:31 PM  Result Value Ref Range Status   Specimen Description SPUTUM  Final   Special Requests NONE  Final   Sputum evaluation   Final    THIS SPECIMEN IS ACCEPTABLE. RESPIRATORY CULTURE REPORT TO FOLLOW.   Report Status 08/01/2014 FINAL  Final   Studies/Results: Dg Chest 2 View  08/01/2014   CLINICAL DATA:  Pneumonia, hypertension  EXAM: CHEST  2 VIEW  COMPARISON:  July 31, 2014  FINDINGS: The  heart size and mediastinal contours are stable. The heart size is enlarged. There is mild patchy opacity of lateral left lung base. The right hemidiaphragm is elevated, chronic. There is no pulmonary edema or pleural effusion. The visualized skeletal structures are stable.  IMPRESSION: Mild patchy opacity of lateral left lung base suggesting early pneumonia.   Electronically Signed   By: Abelardo Diesel M.D.   On: 08/01/2014 08:23   Dg Chest Port 1 View  07/31/2014   CLINICAL DATA:  Initial encounter for cough was shortness of breath and congestion for 2 days.  EXAM: PORTABLE CHEST - 1 VIEW  COMPARISON:  11/27/2012  FINDINGS: 1925 hrs stable asymmetric elevation of the right hemidiaphragm on this low volume film. Vascular congestion without edema. No focal airspace consolidation, pleural effusion, or pneumothorax. Cardiopericardial silhouette is at upper limits of normal for size. Imaged bony structures of the thorax are intact. Telemetry leads overlie the chest.  IMPRESSION: Stable.  No acute cardiopulmonary findings.   Electronically Signed   By: Misty Stanley M.D.   On: 07/31/2014 19:39    Medications: I have reviewed the patient's current medications.  Assesment:   Active Problems:   CAP (community acquired pneumonia)   Diabetes   Hypertension   Obesity   Community acquired pneumonia    Plan:   medications reviewed Will continue iv antibiotics Continue regular treatment D/C IV fluid    LOS: 2 days   Adlai Nieblas 08/02/2014, 11:11 AM

## 2014-08-02 NOTE — Plan of Care (Signed)
Problem: Phase III Progression Outcomes Goal: O2 sats > or equal to 93% on room air Outcome: Completed/Met Date Met:  08/02/14 96% on RA while at rest and with ambulation

## 2014-08-03 ENCOUNTER — Inpatient Hospital Stay (HOSPITAL_COMMUNITY): Payer: BLUE CROSS/BLUE SHIELD

## 2014-08-03 LAB — GLUCOSE, CAPILLARY
GLUCOSE-CAPILLARY: 92 mg/dL (ref 70–99)
GLUCOSE-CAPILLARY: 88 mg/dL (ref 70–99)
GLUCOSE-CAPILLARY: 225 mg/dL — AB (ref 70–99)
Glucose-Capillary: 177 mg/dL — ABNORMAL HIGH (ref 70–99)

## 2014-08-03 LAB — HIV ANTIBODY (ROUTINE TESTING W REFLEX): HIV 1&2 Ab, 4th Generation: NONREACTIVE

## 2014-08-03 NOTE — Progress Notes (Signed)
Subjective: Patient had a fever of 101 last night. His O2 sat dropped to 87% last night. He feels better today.  Objective: Vital signs in last 24 hours: Temp:  [98.2 F (36.8 C)-101.1 F (38.4 C)] 99.2 F (37.3 C) (01/03 0540) Pulse Rate:  [77-101] 77 (01/03 0540) Resp:  [20-24] 20 (01/03 0540) BP: (144-173)/(69-78) 144/69 mmHg (01/03 0540) SpO2:  [87 %-97 %] 97 % (01/03 0540) Weight:  [117.6 kg (259 lb 4.2 oz)] 117.6 kg (259 lb 4.2 oz) (01/03 0540) Weight change:  Last BM Date: 08/02/14  Intake/Output from previous day: 01/02 0701 - 01/03 0700 In: 1080 [P.O.:480; IV Piggyback:600] Out: 750 [Urine:750]  PHYSICAL EXAM General appearance: alert, no distress and moderately obese Resp: diminished breath sounds bilaterally and rhonchi bilaterally Cardio: regular rate and rhythm GI: soft, non-tender; bowel sounds normal; no masses,  no organomegaly Extremities: extremities normal, atraumatic, no cyanosis or edema  Lab Results:  Results for orders placed or performed during the hospital encounter of 07/31/14 (from the past 48 hour(s))  Glucose, capillary     Status: Abnormal   Collection Time: 08/01/14 11:34 AM  Result Value Ref Range   Glucose-Capillary 228 (H) 70 - 99 mg/dL  Glucose, capillary     Status: Abnormal   Collection Time: 08/01/14  5:51 PM  Result Value Ref Range   Glucose-Capillary 293 (H) 70 - 99 mg/dL   Comment 1 Notify RN    Comment 2 Documented in Chart   Glucose, capillary     Status: Abnormal   Collection Time: 08/01/14  9:09 PM  Result Value Ref Range   Glucose-Capillary 219 (H) 70 - 99 mg/dL  CBC     Status: Abnormal   Collection Time: 08/02/14  6:15 AM  Result Value Ref Range   WBC 7.2 4.0 - 10.5 K/uL   RBC 5.36 4.22 - 5.81 MIL/uL   Hemoglobin 14.2 13.0 - 17.0 g/dL   HCT 44.8 39.0 - 52.0 %   MCV 83.6 78.0 - 100.0 fL   MCH 26.5 26.0 - 34.0 pg   MCHC 31.7 30.0 - 36.0 g/dL   RDW 15.4 11.5 - 15.5 %   Platelets 137 (L) 150 - 400 K/uL    Comment:  SPECIMEN CHECKED FOR CLOTS PLATELET COUNT CONFIRMED BY SMEAR   Basic metabolic panel     Status: Abnormal   Collection Time: 08/02/14  6:15 AM  Result Value Ref Range   Sodium 138 135 - 145 mmol/L    Comment: Please note change in reference range.   Potassium 3.6 3.5 - 5.1 mmol/L    Comment: Please note change in reference range.   Chloride 103 96 - 112 mEq/L   CO2 29 19 - 32 mmol/L   Glucose, Bld 98 70 - 99 mg/dL   BUN 17 6 - 23 mg/dL   Creatinine, Ser 1.09 0.50 - 1.35 mg/dL   Calcium 8.2 (L) 8.4 - 10.5 mg/dL   GFR calc non Af Amer 71 (L) >90 mL/min   GFR calc Af Amer 83 (L) >90 mL/min    Comment: (NOTE) The eGFR has been calculated using the CKD EPI equation. This calculation has not been validated in all clinical situations. eGFR's persistently <90 mL/min signify possible Chronic Kidney Disease.    Anion gap 6 5 - 15  Glucose, capillary     Status: Abnormal   Collection Time: 08/02/14  7:36 AM  Result Value Ref Range   Glucose-Capillary 110 (H) 70 - 99 mg/dL  Comment 1 Notify RN    Comment 2 Documented in Chart   Glucose, capillary     Status: Abnormal   Collection Time: 08/02/14 11:32 AM  Result Value Ref Range   Glucose-Capillary 183 (H) 70 - 99 mg/dL   Comment 1 Notify RN    Comment 2 Documented in Chart   Glucose, capillary     Status: Abnormal   Collection Time: 08/02/14  4:25 PM  Result Value Ref Range   Glucose-Capillary 193 (H) 70 - 99 mg/dL   Comment 1 Notify RN    Comment 2 Documented in Chart   Glucose, capillary     Status: Abnormal   Collection Time: 08/02/14  8:38 PM  Result Value Ref Range   Glucose-Capillary 140 (H) 70 - 99 mg/dL  Glucose, capillary     Status: None   Collection Time: 08/03/14  8:00 AM  Result Value Ref Range   Glucose-Capillary 92 70 - 99 mg/dL   Comment 1 Notify RN    Comment 2 Documented in Chart     ABGS No results for input(s): PHART, PO2ART, TCO2, HCO3 in the last 72 hours.  Invalid input(s): PCO2 CULTURES Recent  Results (from the past 240 hour(s))  Blood culture (routine x 2)     Status: None (Preliminary result)   Collection Time: 07/31/14  8:11 PM  Result Value Ref Range Status   Specimen Description BLOOD RIGHT ANTECUBITAL  Final   Special Requests BOTTLES DRAWN AEROBIC AND ANAEROBIC 8CC  Final   Culture NO GROWTH 2 DAYS  Final   Report Status PENDING  Incomplete  Blood culture (routine x 2)     Status: None (Preliminary result)   Collection Time: 07/31/14  8:16 PM  Result Value Ref Range Status   Specimen Description BLOOD RIGHT ARM  Final   Special Requests BOTTLES DRAWN AEROBIC AND ANAEROBIC 8CC  Final   Culture NO GROWTH 2 DAYS  Final   Report Status PENDING  Incomplete  Culture, sputum-assessment     Status: None   Collection Time: 07/31/14 11:31 PM  Result Value Ref Range Status   Specimen Description SPUTUM  Final   Special Requests NONE  Final   Sputum evaluation   Final    THIS SPECIMEN IS ACCEPTABLE. RESPIRATORY CULTURE REPORT TO FOLLOW.   Report Status 08/01/2014 FINAL  Final   Studies/Results: No results found.  Medications: I have reviewed the patient's current medications.  Assesment:   Active Problems:   CAP (community acquired pneumonia)   Diabetes   Hypertension   Obesity   Community acquired pneumonia    Plan:   medications reviewed Will continue iv antibiotics Repeat chest x-rayt CBC/BMP in AM.    LOS: 3 days   Vincent Ramos 08/03/2014, 10:03 AM

## 2014-08-03 NOTE — Progress Notes (Signed)
Utilization review Completed Fumi Guadron RN BSN   

## 2014-08-04 LAB — CBC
HEMATOCRIT: 42.5 % (ref 39.0–52.0)
Hemoglobin: 13.1 g/dL (ref 13.0–17.0)
MCH: 25.5 pg — AB (ref 26.0–34.0)
MCHC: 30.8 g/dL (ref 30.0–36.0)
MCV: 82.8 fL (ref 78.0–100.0)
PLATELETS: 152 10*3/uL (ref 150–400)
RBC: 5.13 MIL/uL (ref 4.22–5.81)
RDW: 14.9 % (ref 11.5–15.5)
WBC: 5.4 10*3/uL (ref 4.0–10.5)

## 2014-08-04 LAB — CULTURE, RESPIRATORY

## 2014-08-04 LAB — GLUCOSE, CAPILLARY
Glucose-Capillary: 117 mg/dL — ABNORMAL HIGH (ref 70–99)
Glucose-Capillary: 155 mg/dL — ABNORMAL HIGH (ref 70–99)

## 2014-08-04 LAB — CULTURE, RESPIRATORY W GRAM STAIN: Culture: NORMAL

## 2014-08-04 LAB — BASIC METABOLIC PANEL
ANION GAP: 5 (ref 5–15)
BUN: 12 mg/dL (ref 6–23)
CHLORIDE: 101 meq/L (ref 96–112)
CO2: 31 mmol/L (ref 19–32)
Calcium: 8.5 mg/dL (ref 8.4–10.5)
Creatinine, Ser: 1.08 mg/dL (ref 0.50–1.35)
GFR, EST AFRICAN AMERICAN: 84 mL/min — AB (ref >90)
GFR, EST NON AFRICAN AMERICAN: 72 mL/min — AB (ref >90)
Glucose, Bld: 116 mg/dL — ABNORMAL HIGH (ref 70–99)
POTASSIUM: 3.6 mmol/L (ref 3.5–5.1)
Sodium: 137 mmol/L (ref 135–145)

## 2014-08-04 MED ORDER — AMOXICILLIN-POT CLAVULANATE 500-125 MG PO TABS: 1.0000 | ORAL_TABLET | Freq: Three times a day (TID) | ORAL | Status: DC

## 2014-08-04 NOTE — Progress Notes (Signed)
Patient discharged with instructions, prescription, and care notes.  Verbalized understanding via teach back.  IV was removed and the site was WNL. Patient voiced no further complaints or concerns at the time of discharge.  Appointments scheduled per instructions.  Patient left the floor via w/c with staff and family in stable condition. 

## 2014-08-04 NOTE — Plan of Care (Deleted)
Discussed with Dr. Willey Blade the patients transfer to [facility] for his Cardiac Cath.  MD states that the patients labs are all within range of the 14 days per the orders.  Discussed with Dr. Willey Blade to see if the patient understands the procedure for Cardiac Cath and he stated that the patient did and he is cleared to go.  I notified Cardiac Cath Lab at [facility] and spoke to Santiago Glad and she stated that they had a full schedule today and that when they were ready for him they would call [facility] to set up transportation.  Also spoke with Aaron Edelman and he stated that consent had to be signed prior to transfer.  In a previous note earlier in his admission Dr. Harrington Challenger stated that she was not sure due to his condition that he understood that he was going for the procedure. See above where I discussed with Dr. Willey Blade.  The patient now is currently A/O x4 this am and is asking questions about when he was going to be transferred.

## 2014-08-04 NOTE — Discharge Summary (Signed)
Physician Discharge Summary  Patient ID: Vincent Ramos MRN: [id number] DOB/AGE: [birth year] 64 y.o. Primary Care Physician:Dominika Losey, MD Admit date: 07/31/2014 Discharge date: 08/04/2014    Discharge Diagnoses:   Active Problems:   CAP (community acquired pneumonia)   Diabetes   Hypertension   Obesity   Community acquired pneumonia     Medication List    TAKE these medications        amLODipine 10 MG tablet  Commonly known as:  NORVASC  Take 5 mg by mouth daily.     amoxicillin-clavulanate 500-125 MG per tablet  Commonly known as:  AUGMENTIN  Take 1 tablet (500 mg total) by mouth 3 (three) times daily.     aspirin EC 81 MG tablet  Take 81 mg by mouth daily.     enalapril 20 MG tablet  Commonly known as:  VASOTEC  Take 40 mg by mouth daily.     hydrALAZINE 50 MG tablet  Commonly known as:  APRESOLINE  Take 50 mg by mouth 2 (two) times daily.     insulin glargine 100 UNIT/ML injection  Commonly known as:  LANTUS  Inject 30 Units into the skin at bedtime.     INVOKANA PO  Take 100 mg by mouth daily.     potassium chloride SA 20 MEQ tablet  Commonly known as:  K-DUR,KLOR-CON  Take 1 tablet (20 mEq total) by mouth daily. Patient needs appointment for future refills.        Discharged Condition: improved    Consults: none  Significant Diagnostic Studies: Dg Chest 1 View  08/03/2014   CLINICAL DATA:  Cough, low oxygen saturation.  EXAM: CHEST - 1 VIEW  COMPARISON:  August 01, 2014  FINDINGS: The heart size and mediastinal contours are stable. The heart size is enlarged. There is elevation of right hemidiaphragm unchanged. Crowding of central pulmonary vessels are identified unchanged. There is no pleural effusion. Previously noted patchy opacity of left lung base has resolved. The visualized skeletal structures are unremarkable.  IMPRESSION: Previously described patchy opacity of left lung base has resolved. There is no focal pneumonia. There are crowding  of central pulmonary vessel unchanged.   Electronically Signed   By: Abelardo Diesel M.D.   On: 08/03/2014 10:42   Dg Chest 2 View  08/01/2014   CLINICAL DATA:  Pneumonia, hypertension  EXAM: CHEST  2 VIEW  COMPARISON:  July 31, 2014  FINDINGS: The heart size and mediastinal contours are stable. The heart size is enlarged. There is mild patchy opacity of lateral left lung base. The right hemidiaphragm is elevated, chronic. There is no pulmonary edema or pleural effusion. The visualized skeletal structures are stable.  IMPRESSION: Mild patchy opacity of lateral left lung base suggesting early pneumonia.   Electronically Signed   By: Abelardo Diesel M.D.   On: 08/01/2014 08:23   Dg Chest Port 1 View  07/31/2014   CLINICAL DATA:  Initial encounter for cough was shortness of breath and congestion for 2 days.  EXAM: PORTABLE CHEST - 1 VIEW  COMPARISON:  11/27/2012  FINDINGS: 1925 hrs stable asymmetric elevation of the right hemidiaphragm on this low volume film. Vascular congestion without edema. No focal airspace consolidation, pleural effusion, or pneumothorax. Cardiopericardial silhouette is at upper limits of normal for size. Imaged bony structures of the thorax are intact. Telemetry leads overlie the chest.  IMPRESSION: Stable.  No acute cardiopulmonary findings.   Electronically Signed   By: Misty Stanley M.D.   On: 07/31/2014  19:39    Lab Results: Basic Metabolic Panel:  Recent Labs  08/02/14 0615 08/04/14 0652  NA 138 137  K 3.6 3.6  CL 103 101  CO2 29 31  GLUCOSE 98 116*  BUN 17 12  CREATININE 1.09 1.08  CALCIUM 8.2* 8.5   Liver Function Tests: No results for input(s): AST, ALT, ALKPHOS, BILITOT, PROT, ALBUMIN in the last 72 hours.   CBC:  Recent Labs  08/02/14 0615 08/04/14 0652  WBC 7.2 5.4  HGB 14.2 13.1  HCT 44.8 42.5  MCV 83.6 82.8  PLT 137* 152    Recent Results (from the past 240 hour(s))  Blood culture (routine x 2)     Status: None (Preliminary result)    Collection Time: 07/31/14  8:11 PM  Result Value Ref Range Status   Specimen Description BLOOD RIGHT ANTECUBITAL  Final   Special Requests BOTTLES DRAWN AEROBIC AND ANAEROBIC 8CC  Final   Culture NO GROWTH 3 DAYS  Final   Report Status PENDING  Incomplete  Blood culture (routine x 2)     Status: None (Preliminary result)   Collection Time: 07/31/14  8:16 PM  Result Value Ref Range Status   Specimen Description BLOOD RIGHT ARM  Final   Special Requests BOTTLES DRAWN AEROBIC AND ANAEROBIC 8CC  Final   Culture NO GROWTH 3 DAYS  Final   Report Status PENDING  Incomplete  Culture, sputum-assessment     Status: None   Collection Time: 07/31/14 11:31 PM  Result Value Ref Range Status   Specimen Description SPUTUM  Final   Special Requests NONE  Final   Sputum evaluation   Final    THIS SPECIMEN IS ACCEPTABLE. RESPIRATORY CULTURE REPORT TO FOLLOW.   Report Status 08/01/2014 FINAL  Final  Culture, respiratory (NON-Expectorated)     Status: None (Preliminary result)   Collection Time: 07/31/14 11:31 PM  Result Value Ref Range Status   Specimen Description SPUTUM  Final   Special Requests NONE  Final   Gram Stain   Final    FEW WBC PRESENT, PREDOMINANTLY PMN RARE SQUAMOUS EPITHELIAL CELLS PRESENT MODERATE GRAM POSITIVE COCCI IN PAIRS IN CHAINS FEW GRAM NEGATIVE RODS Performed at [facility]    Culture   Final    NORMAL OROPHARYNGEAL FLORA Performed at [facility]    Report Status PENDING  Incomplete     Hospital Course:   This is a 63 years old male patient who admitted due to  Cough and Dyspnea as a case of community acquired pneumonia. He was treated with combination of zithmax and rocephin.  Patient has improved and is stable. He is being discharged on Augmentin 500 mg po TID.  Discharge Exam: Blood pressure 165/92, pulse 78, temperature 99.7 F (37.6 C), temperature source Oral, resp. rate 20, height 5\' 6"  (1.676 m), weight 117.6 kg (259 lb 4.2 oz), SpO2 94  %.   Disposition:  home      Signed: Brennley Curtice   08/04/2014, 8:09 AM

## 2014-08-04 NOTE — Care Management Note (Signed)
    Page 1 of 1   08/04/2014     8:26:12 AM CARE MANAGEMENT NOTE 08/04/2014  Patient:  CLINE, DRAHEIM   Account Number:  [id number]  Date Initiated:  08/04/2014  Documentation initiated by:  Theophilus Kinds  Subjective/Objective Assessment:   Pt admitted from home with pneumonia. Pt lives with his wife and will return home at discharge. Pt is independent with ADL's. Pt has a cane that he uses prn when his back is hurting.     Action/Plan:   No CM needs noted. Pt discharged home today.   Anticipated DC Date:  08/04/2014   Anticipated DC Plan:  [place]  CM consult      Choice offered to / List presented to:             Status of service:  Completed, signed off Medicare Important Message given?   (If response is "NO", the following Medicare IM given date fields will be blank) Date Medicare IM given:   Medicare IM given by:   Date Additional Medicare IM given:   Additional Medicare IM given by:    Discharge Disposition:  HOME/SELF CARE  Per UR Regulation:    If discussed at Long Length of Stay Meetings, dates discussed:    Comments:  08/04/14 0825 Christinia Gully, RN BSN CM

## 2014-08-05 LAB — CULTURE, BLOOD (ROUTINE X 2)
Culture: NO GROWTH
Culture: NO GROWTH

## 2014-08-05 LAB — LEGIONELLA ANTIGEN, URINE

## 2014-11-05 ENCOUNTER — Encounter: Payer: Commercial Managed Care - HMO | Attending: Internal Medicine | Admitting: Nutrition

## 2014-11-05 ENCOUNTER — Encounter: Payer: Self-pay | Admitting: Nutrition

## 2014-11-05 VITALS — Ht 66.5 in | Wt 265.0 lb

## 2014-11-05 DIAGNOSIS — E118 Type 2 diabetes mellitus with unspecified complications: Secondary | ICD-10-CM | POA: Diagnosis present

## 2014-11-05 DIAGNOSIS — Z794 Long term (current) use of insulin: Secondary | ICD-10-CM | POA: Diagnosis not present

## 2014-11-05 DIAGNOSIS — Z6841 Body Mass Index (BMI) 40.0 and over, adult: Secondary | ICD-10-CM | POA: Insufficient documentation

## 2014-11-05 DIAGNOSIS — IMO0002 Reserved for concepts with insufficient information to code with codable children: Secondary | ICD-10-CM

## 2014-11-05 DIAGNOSIS — Z713 Dietary counseling and surveillance: Secondary | ICD-10-CM | POA: Insufficient documentation

## 2014-11-05 DIAGNOSIS — E1165 Type 2 diabetes mellitus with hyperglycemia: Secondary | ICD-10-CM

## 2014-11-05 NOTE — Progress Notes (Signed)
  Medical Nutrition Therapy:  Appt start time: 4503 end time:  1630.  Assessment:  Primary concerns today: Diabetes. Lives with his wife. He does a lot of cooking and shopping. Most foods are prepared by baked, broiled and fried. Wants to lose weight to get his diabets better and come off of some of his medications. Invokana 100 mg once a day and 30  Units of Lantus daily. Injects in his stomach, on the side of his waist and arms and legs. Has a 'knot' that developed on the left side of his waist and so he doesn't inject there anymore.  States he has lost 15 lbs in the last month or two. He has been lifting light weights, walking 1 mille daily and going to the [facility].   Has been reading some diabetes books. Recently has changed by cut out processed meats, fried foods and junk food and drinking more water and that his how he lost his weight. Has 2 pinched disc's in his back and limited with certain physical activity.    Diet is still low in fresh fruits and vegetables. Never has met with a dietitian before. Was following what his sister was told to do for her diabetes.    Needs to work on further weight loss and consistency of meals and taking medications accurately and testing.  Preferred Learning Style:   Auditory  Visual  Hands on  Learning Readiness:   Ready  Change in progress  MEDICATIONS: See list   DIETARY INTAKE:  24-hr recall:  B ( AM):2 eggs, with cheese, bowl of grits and 2 slices link sausage, water, coffee Snk ( AM): protein bar or spoonful of peanutbutter  L ( PM): Deer meat sandwich on white bread, and Diet soda, Snk ( PM): Peanuts , water D ( PM): Hamburger , ff and diet soda Snk ( PM):  Beverages: water  Usual physical activity: walking, working for [employer]  Estimated energy needs: 1800 calories 200 g carbohydrates 135 g protein 50 g fat  Progress Towards Goal(s):  In progress.   Nutritional Diagnosis:  NB-1.1 Food and nutrition-related knowledge deficit As  related to Diabetes.  As evidenced by A1C 10.5%.    Intervention:  Nutrition counseling and diabetes education on diet, disease, My Plate, CHO counting, portion sizes, target ranges for blood sugars, complications of DM, testing/monitoring of blood sugars, importance of good blood sugars control and prevention of complications and exercise for needed weight loss.. Goal:  1. Follow Plate Method as discussed. 2. Eat 3 carb choices per meal. 3. Increase fresh fruits and vegetables. 4. Cut out snacks between meals and after supper. 5. Cut out processed meats, cheese and junk food. 6. Get A1C to 8% in three months. 7. Take insulin and medications as prescribed. 8. Do not skip meals. 9. Lose 1-2 lbs per week til next visit.  Teaching Method Utilized:  Visual Auditory Hands on  Handouts given during visit include:  The Plate Method  The Meal Plan Card  Barriers to learning/adherence to lifestyle change: none  Demonstrated degree of understanding via:  Teach Back   Monitoring/Evaluation:  Dietary intake, exercise, meal planning, SBG, and body weight in 1 month(s).

## 2014-11-05 NOTE — Patient Instructions (Signed)
Goal:  1. Follow Plate Method as discussed. 2. Eat 3 carb choices per meal. 3. Increase fresh fruits and vegetables. 4. Cut out snacks between meals and after supper. 5. Cut out processed meats, cheese and junk food. 6. Get A1C to 8% in three months. 7. Take insulin and medications as prescribed. 8. Do not skip meals. 9. Lose 1-2 lbs per week til next visit.

## 2014-12-18 ENCOUNTER — Encounter: Payer: Commercial Managed Care - HMO | Attending: Internal Medicine | Admitting: Nutrition

## 2014-12-18 DIAGNOSIS — Z713 Dietary counseling and surveillance: Secondary | ICD-10-CM | POA: Insufficient documentation

## 2014-12-18 DIAGNOSIS — E118 Type 2 diabetes mellitus with unspecified complications: Secondary | ICD-10-CM | POA: Insufficient documentation

## 2014-12-18 DIAGNOSIS — Z6841 Body Mass Index (BMI) 40.0 and over, adult: Secondary | ICD-10-CM | POA: Insufficient documentation

## 2014-12-18 DIAGNOSIS — Z794 Long term (current) use of insulin: Secondary | ICD-10-CM | POA: Insufficient documentation

## 2015-02-04 ENCOUNTER — Ambulatory Visit (HOSPITAL_COMMUNITY)
Admission: RE | Admit: 2015-02-04 | Discharge: 2015-02-04 | Disposition: A | Discharge status: Home / Self Care | Payer: Commercial Managed Care - HMO | Source: Ambulatory Visit | Attending: Internal Medicine | Admitting: Internal Medicine

## 2015-02-04 ENCOUNTER — Other Ambulatory Visit (HOSPITAL_COMMUNITY): Payer: Self-pay | Admitting: Internal Medicine

## 2015-02-04 DIAGNOSIS — J40 Bronchitis, not specified as acute or chronic: Secondary | ICD-10-CM

## 2015-02-04 DIAGNOSIS — R05 Cough: Secondary | ICD-10-CM | POA: Diagnosis present

## 2015-02-13 ENCOUNTER — Encounter: Payer: Self-pay | Admitting: *Deleted

## 2015-06-12 ENCOUNTER — Other Ambulatory Visit (HOSPITAL_COMMUNITY)
Admission: RE | Admit: 2015-06-12 | Discharge: 2015-06-12 | Disposition: A | Discharge status: Home / Self Care | Payer: Commercial Managed Care - HMO | Source: Ambulatory Visit | Attending: Internal Medicine | Admitting: Internal Medicine

## 2015-06-12 DIAGNOSIS — M5489 Other dorsalgia: Secondary | ICD-10-CM | POA: Diagnosis not present

## 2015-06-12 DIAGNOSIS — I10 Essential (primary) hypertension: Secondary | ICD-10-CM | POA: Diagnosis present

## 2015-06-12 DIAGNOSIS — E1165 Type 2 diabetes mellitus with hyperglycemia: Secondary | ICD-10-CM | POA: Diagnosis present

## 2015-06-12 LAB — BASIC METABOLIC PANEL
Anion gap: 5 (ref 5–15)
BUN: 18 mg/dL (ref 6–20)
CHLORIDE: 105 mmol/L (ref 101–111)
CO2: 29 mmol/L (ref 22–32)
Calcium: 8.8 mg/dL — ABNORMAL LOW (ref 8.9–10.3)
Creatinine, Ser: 1.25 mg/dL — ABNORMAL HIGH (ref 0.61–1.24)
Glucose, Bld: 150 mg/dL — ABNORMAL HIGH (ref 65–99)
POTASSIUM: 3.9 mmol/L (ref 3.5–5.1)
SODIUM: 139 mmol/L (ref 135–145)

## 2015-06-12 LAB — CBC WITH DIFFERENTIAL/PLATELET
Basophils Absolute: 0 10*3/uL (ref 0.0–0.1)
Basophils Relative: 1 %
EOS ABS: 0.3 10*3/uL (ref 0.0–0.7)
Eosinophils Relative: 4 %
HCT: 43.2 % (ref 39.0–52.0)
Hemoglobin: 13.9 g/dL (ref 13.0–17.0)
LYMPHS PCT: 23 %
Lymphs Abs: 1.7 10*3/uL (ref 0.7–4.0)
MCH: 26.5 pg (ref 26.0–34.0)
MCHC: 32.2 g/dL (ref 30.0–36.0)
MCV: 82.4 fL (ref 78.0–100.0)
MONO ABS: 0.8 10*3/uL (ref 0.1–1.0)
Monocytes Relative: 12 %
NEUTROS ABS: 4.3 10*3/uL (ref 1.7–7.7)
Neutrophils Relative %: 60 %
PLATELETS: 153 10*3/uL (ref 150–400)
RBC: 5.24 MIL/uL (ref 4.22–5.81)
RDW: 14.6 % (ref 11.5–15.5)
WBC: 7.2 10*3/uL (ref 4.0–10.5)

## 2015-06-13 LAB — HEMOGLOBIN A1C
Hgb A1c MFr Bld: 8.8 % — ABNORMAL HIGH (ref 4.8–5.6)
Mean Plasma Glucose: 206 mg/dL

## 2015-07-17 ENCOUNTER — Ambulatory Visit (INDEPENDENT_AMBULATORY_CARE_PROVIDER_SITE_OTHER): Payer: Commercial Managed Care - HMO | Admitting: Urology

## 2015-07-17 DIAGNOSIS — N5201 Erectile dysfunction due to arterial insufficiency: Secondary | ICD-10-CM

## 2015-07-17 DIAGNOSIS — E291 Testicular hypofunction: Secondary | ICD-10-CM | POA: Diagnosis not present

## 2015-08-05 DIAGNOSIS — M5430 Sciatica, unspecified side: Secondary | ICD-10-CM | POA: Diagnosis not present

## 2015-08-05 DIAGNOSIS — M545 Low back pain: Secondary | ICD-10-CM | POA: Diagnosis not present

## 2015-08-05 DIAGNOSIS — M4806 Spinal stenosis, lumbar region: Secondary | ICD-10-CM | POA: Diagnosis not present

## 2015-08-13 DIAGNOSIS — Z6841 Body Mass Index (BMI) 40.0 and over, adult: Secondary | ICD-10-CM | POA: Diagnosis not present

## 2015-08-13 DIAGNOSIS — I10 Essential (primary) hypertension: Secondary | ICD-10-CM | POA: Diagnosis not present

## 2015-08-18 ENCOUNTER — Other Ambulatory Visit: Payer: Self-pay | Admitting: Neurosurgery

## 2015-08-23 NOTE — Progress Notes (Signed)
Patient ID: Shem Klouda Brueckner Sr., male   DOB: [birth year], 65 y.o.   MRN: [id number]     Cardiology Office Note   Date:  08/23/2015   ID:  Laterrance Reister Humber Sr., DOB November 12, 1950, MRN [id number]  PCP:  Rosita Fire, MD  Cardiologist:   [name], MD   No chief complaint on file.     History of Present Illness: Pam Seats Kato Sr. is a 65 y.o. male who presents for dyspnea Previously seen by [facility].  Reviewed previous studies from 2014 Echo normal EF 65%.  Myovue no ischemia ? Inferior scar. Renal duplex no RAS History of HTN.  Admitted 08/2014 with dyspnea Rx for pneumonia  Poorly controlled diabetic A1c 06/2015 was 8.8 CXR reviewed from 01/2015  NAD no CE.  On disability for his back Not happy with his primary. Won't be flexible with his diabetes meds. Compliant with meds but diet could be better. Exertional dyspnea and chest pressure last few months. Goes to [facility] and some days can walk 45 minutes and other days not at all.  No resting pain.  Mild cough no fever or sputum.  Mild dependant edema.  Tightness in chest usually associated with dyspnea.  No audible wheezing  Significant obesity     Past Medical History  Diagnosis Date  . Diabetes mellitus   . Hypertension   . Shortness of breath     Past Surgical History  Procedure Laterality Date  . Laceration repair      left hand  . Circumcision    . Hydrocele excision  10/14/2011    Procedure: HYDROCELECTOMY ADULT;  Surgeon: Malka So, MD;  Location: [facility]S;  Service: Urology;  Laterality: Right;     Current Outpatient Prescriptions  Medication Sig Dispense Refill  . amLODipine (NORVASC) 10 MG tablet Take 5 mg by mouth daily.     Marland Kitchen amoxicillin-clavulanate (AUGMENTIN) 500-125 MG per tablet Take 1 tablet (500 mg total) by mouth 3 (three) times daily. (Patient not taking: Reported on 11/05/2014) 15 tablet 0  . aspirin EC 81 MG tablet Take 81 mg by mouth daily.    . Canagliflozin (INVOKANA PO) Take 100 mg by mouth daily.    . enalapril  (VASOTEC) 20 MG tablet Take 40 mg by mouth daily.  0  . hydrALAZINE (APRESOLINE) 50 MG tablet Take 50 mg by mouth 2 (two) times daily.  0  . insulin glargine (LANTUS) 100 UNIT/ML injection Inject 30 Units into the skin at bedtime.     . potassium chloride SA (K-DUR,KLOR-CON) 20 MEQ tablet Take 1 tablet (20 mEq total) by mouth daily. Patient needs appointment for future refills. 30 tablet 0   No current facility-administered medications for this visit.    Allergies:   Metformin and related    Social History:  The patient  reports that he has never smoked. He does not have any smokeless tobacco history on file. He reports that he drinks alcohol. He reports that he does not use illicit drugs.   Family History:  The patient's family history is negative for Anesthesia problems, Hypotension, Malignant hyperthermia, and Pseudochol deficiency.    ROS:  Please see the history of present illness.   Otherwise, review of systems are positive for none.   All other systems are reviewed and negative.    PHYSICAL EXAM: VS:  There were no vitals taken for this visit. , BMI There is no weight on file to calculate BMI. Affect appropriate Obese black male  HEENT: normal  Neck supple with no adenopathy JVP normal no bruits no thyromegaly Lungs clear with no wheezing and good diaphragmatic motion Heart:  S1/S2 SEM  murmur, no rub, gallop or click PMI normal Abdomen: benighn, BS positve, no tenderness, no AAA no bruit.  No HSM or HJR Distal pulses intact with no bruits Plus one bilateral edema Neuro non-focal Skin warm and dry No muscular weakness    EKG:  03/31/15  SR normal ECG    Recent Labs: 06/12/2015: BUN 18; Creatinine, Ser 1.25*; Hemoglobin 13.9; Platelets 153; Potassium 3.9; Sodium 139    Lipid Panel    Component Value Date/Time   CHOL 193 01/04/2013 0804   TRIG 129 01/04/2013 0804   HDL 45 01/04/2013 0804   CHOLHDL 4.3 01/04/2013 0804   VLDL 26 01/04/2013 0804   LDLCALC 122*  01/04/2013 0804      Wt Readings from Last 3 Encounters:  11/05/14 120.203 kg (265 lb)  08/03/14 117.6 kg (259 lb 4.2 oz)  05/28/13 124.739 kg (275 lb)      Other studies Reviewed: Additional studies/ records that were reviewed today include:  Office notes Dr Legrand Rams.    ASSESSMENT AND PLAN:  1.  Dyspnea:  Likely from obesity will check echo exam and ECG ok 2. Chest Tightness:  Multiple CRF;s and DM f/u exercise myovue 3. HTN:  Consider adding beta blocker in future discussed weight loss and low sodium diet 4. DM:  Encouraged him to see endocrinologist since he is not happy with meds primary is prescribing A1c 6.6 not ideal 5. Obesity:  Increases insulin requirements discussed weight loss goals  6. SEM:  Benign sounding f/u echo ordered see above    Current medicines are reviewed at length with the patient today.  The patient does not have concerns regarding medicines.  The following changes have been made:  no change  Labs/ tests ordered today include:  Echo and Exercise Myovue   No orders of the defined types were placed in this encounter.     Disposition:   FU with [facility] doctor in a year if tests normal      Signed, [name], MD  08/23/2015 8:30 PM    [address], [place], [place]  [address] Phone: [phone]; Fax: [phone]

## 2015-08-25 ENCOUNTER — Other Ambulatory Visit: Payer: Self-pay | Admitting: Neurosurgery

## 2015-08-25 DIAGNOSIS — M5440 Lumbago with sciatica, unspecified side: Secondary | ICD-10-CM

## 2015-08-26 ENCOUNTER — Encounter: Payer: Self-pay | Admitting: *Deleted

## 2015-08-26 ENCOUNTER — Encounter: Payer: Self-pay | Admitting: Cardiovascular Disease

## 2015-08-26 ENCOUNTER — Ambulatory Visit (INDEPENDENT_AMBULATORY_CARE_PROVIDER_SITE_OTHER): Payer: PPO | Admitting: Cardiovascular Disease

## 2015-08-26 VITALS — BP 170/60 | HR 90 | Ht 66.5 in | Wt 270.0 lb

## 2015-08-26 DIAGNOSIS — R0600 Dyspnea, unspecified: Secondary | ICD-10-CM

## 2015-08-26 NOTE — Patient Instructions (Signed)
Medication Instructions:  Your physician recommends that you continue on your current medications as directed. Please refer to the Current Medication list given to you today.   Labwork: none  Testing/Procedures: Your physician has requested that you have en exercise stress myoview. For further information please visit [email or web address]. Please follow instruction sheet, as given.  Your physician has requested that you have an echocardiogram. Echocardiography is a painless test that uses sound waves to create images of your heart. It provides your doctor with information about the size and shape of your heart and how well your heart's chambers and valves are working. This procedure takes approximately one hour. There are no restrictions for this procedure.    Follow-Up: Your physician wants you to follow-up in: 1 year . You will receive a reminder letter in the mail two months in advance. If you don't receive a letter, please call our office to schedule the follow-up appointment.   Any Other Special Instructions Will Be Listed Below (If Applicable).     If you need a refill on your cardiac medications before your next appointment, please call your pharmacy.

## 2015-09-03 ENCOUNTER — Encounter (HOSPITAL_COMMUNITY)
Admission: RE | Admit: 2015-09-03 | Discharge: 2015-09-03 | Disposition: A | Discharge status: Home / Self Care | Payer: PPO | Source: Ambulatory Visit | Attending: Cardiovascular Disease | Admitting: Cardiovascular Disease

## 2015-09-03 ENCOUNTER — Inpatient Hospital Stay (HOSPITAL_COMMUNITY): Admission: RE | Admit: 2015-09-03 | Payer: Self-pay | Source: Ambulatory Visit

## 2015-09-03 ENCOUNTER — Ambulatory Visit (HOSPITAL_COMMUNITY)
Admission: RE | Admit: 2015-09-03 | Discharge: 2015-09-03 | Disposition: A | Discharge status: Home / Self Care | Payer: PPO | Source: Ambulatory Visit | Attending: Cardiovascular Disease | Admitting: Cardiovascular Disease

## 2015-09-03 ENCOUNTER — Encounter (HOSPITAL_COMMUNITY): Payer: Self-pay

## 2015-09-03 DIAGNOSIS — I10 Essential (primary) hypertension: Secondary | ICD-10-CM | POA: Diagnosis not present

## 2015-09-03 DIAGNOSIS — I340 Nonrheumatic mitral (valve) insufficiency: Secondary | ICD-10-CM | POA: Diagnosis not present

## 2015-09-03 DIAGNOSIS — E119 Type 2 diabetes mellitus without complications: Secondary | ICD-10-CM | POA: Diagnosis not present

## 2015-09-03 DIAGNOSIS — R079 Chest pain, unspecified: Secondary | ICD-10-CM | POA: Insufficient documentation

## 2015-09-03 DIAGNOSIS — I071 Rheumatic tricuspid insufficiency: Secondary | ICD-10-CM | POA: Diagnosis not present

## 2015-09-03 DIAGNOSIS — I517 Cardiomegaly: Secondary | ICD-10-CM | POA: Insufficient documentation

## 2015-09-03 DIAGNOSIS — I358 Other nonrheumatic aortic valve disorders: Secondary | ICD-10-CM | POA: Insufficient documentation

## 2015-09-03 DIAGNOSIS — I77810 Thoracic aortic ectasia: Secondary | ICD-10-CM | POA: Insufficient documentation

## 2015-09-03 DIAGNOSIS — R0600 Dyspnea, unspecified: Secondary | ICD-10-CM | POA: Diagnosis not present

## 2015-09-03 DIAGNOSIS — I5189 Other ill-defined heart diseases: Secondary | ICD-10-CM | POA: Insufficient documentation

## 2015-09-03 LAB — NM MYOCAR MULTI W/SPECT W/WALL MOTION / EF
CHL CUP NUCLEAR SDS: 2
CSEPEW: 10.4 METS
CSEPHR: 93 %
Exercise duration (min): 8 min
Exercise duration (sec): 0 s
LHR: 0.32
LV dias vol: 175 mL
LV sys vol: 84 mL
MPHR: 158 {beats}/min
Peak HR: 148 {beats}/min
RPE: 17
Rest HR: 65 {beats}/min
SRS: 4
SSS: 6
TID: 1.07

## 2015-09-03 MED ORDER — TECHNETIUM TC 99M SESTAMIBI - CARDIOLITE
10.0000 | Freq: Once | INTRAVENOUS | Status: AC | PRN
  Administered 2015-09-03: 9 via INTRAVENOUS

## 2015-09-03 MED ORDER — TECHNETIUM TC 99M SESTAMIBI GENERIC - CARDIOLITE
30.0000 | Freq: Once | INTRAVENOUS | Status: AC | PRN
  Administered 2015-09-03: 30 via INTRAVENOUS

## 2015-09-03 MED ORDER — SODIUM CHLORIDE 0.9% FLUSH
INTRAVENOUS | Status: AC
  Administered 2015-09-03: 10 mL via INTRAVENOUS
  Filled 2015-09-03: qty 10

## 2015-09-03 MED ORDER — REGADENOSON 0.4 MG/5ML IV SOLN
INTRAVENOUS | Status: AC
  Filled 2015-09-03: qty 5

## 2015-09-11 ENCOUNTER — Other Ambulatory Visit: Payer: Self-pay | Admitting: Neurosurgery

## 2015-09-11 ENCOUNTER — Ambulatory Visit
Admission: RE | Admit: 2015-09-11 | Discharge: 2015-09-11 | Disposition: A | Discharge status: Home / Self Care | Payer: PPO | Source: Ambulatory Visit | Attending: Neurosurgery | Admitting: Neurosurgery

## 2015-09-11 DIAGNOSIS — M4806 Spinal stenosis, lumbar region: Secondary | ICD-10-CM | POA: Diagnosis not present

## 2015-09-11 DIAGNOSIS — M5440 Lumbago with sciatica, unspecified side: Secondary | ICD-10-CM

## 2015-09-11 MED ORDER — DIAZEPAM 5 MG PO TABS
10.0000 mg | ORAL_TABLET | Freq: Once | ORAL | Status: AC
  Administered 2015-09-11: 10 mg via ORAL

## 2015-09-11 MED ORDER — IOHEXOL 180 MG/ML  SOLN
15.0000 mL | Freq: Once | INTRAMUSCULAR | Status: AC | PRN
  Administered 2015-09-11: 15 mL via INTRATHECAL

## 2015-09-11 NOTE — Discharge Instructions (Signed)

## 2015-09-16 DIAGNOSIS — I10 Essential (primary) hypertension: Secondary | ICD-10-CM | POA: Diagnosis not present

## 2015-09-16 DIAGNOSIS — M5489 Other dorsalgia: Secondary | ICD-10-CM | POA: Diagnosis not present

## 2015-09-16 DIAGNOSIS — E1165 Type 2 diabetes mellitus with hyperglycemia: Secondary | ICD-10-CM | POA: Diagnosis not present

## 2015-09-17 DIAGNOSIS — I10 Essential (primary) hypertension: Secondary | ICD-10-CM | POA: Diagnosis not present

## 2015-09-17 DIAGNOSIS — Z6841 Body Mass Index (BMI) 40.0 and over, adult: Secondary | ICD-10-CM | POA: Diagnosis not present

## 2015-09-17 DIAGNOSIS — M4806 Spinal stenosis, lumbar region: Secondary | ICD-10-CM | POA: Diagnosis not present

## 2015-09-29 ENCOUNTER — Other Ambulatory Visit: Payer: Self-pay

## 2015-09-29 ENCOUNTER — Encounter (HOSPITAL_COMMUNITY): Payer: Self-pay

## 2015-09-29 ENCOUNTER — Encounter (HOSPITAL_COMMUNITY)
Admission: RE | Admit: 2015-09-29 | Discharge: 2015-09-29 | Disposition: A | Discharge status: Home / Self Care | Payer: PPO | Source: Ambulatory Visit | Attending: Neurosurgery | Admitting: Neurosurgery

## 2015-09-29 DIAGNOSIS — Z01812 Encounter for preprocedural laboratory examination: Secondary | ICD-10-CM | POA: Insufficient documentation

## 2015-09-29 DIAGNOSIS — M4806 Spinal stenosis, lumbar region: Secondary | ICD-10-CM | POA: Insufficient documentation

## 2015-09-29 DIAGNOSIS — Z79899 Other long term (current) drug therapy: Secondary | ICD-10-CM | POA: Insufficient documentation

## 2015-09-29 DIAGNOSIS — E119 Type 2 diabetes mellitus without complications: Secondary | ICD-10-CM | POA: Diagnosis not present

## 2015-09-29 DIAGNOSIS — I10 Essential (primary) hypertension: Secondary | ICD-10-CM | POA: Insufficient documentation

## 2015-09-29 DIAGNOSIS — Z01818 Encounter for other preprocedural examination: Secondary | ICD-10-CM | POA: Insufficient documentation

## 2015-09-29 DIAGNOSIS — Z794 Long term (current) use of insulin: Secondary | ICD-10-CM | POA: Insufficient documentation

## 2015-09-29 HISTORY — DX: Unspecified osteoarthritis, unspecified site: M19.90

## 2015-09-29 HISTORY — DX: Pneumonia, unspecified organism: J18.9

## 2015-09-29 HISTORY — DX: Benign prostatic hyperplasia without lower urinary tract symptoms: N40.0

## 2015-09-29 LAB — SURGICAL PCR SCREEN
MRSA, PCR: NEGATIVE
Staphylococcus aureus: NEGATIVE

## 2015-09-29 LAB — GLUCOSE, CAPILLARY: GLUCOSE-CAPILLARY: 129 mg/dL — AB (ref 65–99)

## 2015-09-29 LAB — CBC
HCT: 42.9 % (ref 39.0–52.0)
Hemoglobin: 14.1 g/dL (ref 13.0–17.0)
MCH: 26.6 pg (ref 26.0–34.0)
MCHC: 32.9 g/dL (ref 30.0–36.0)
MCV: 80.8 fL (ref 78.0–100.0)
PLATELETS: 140 10*3/uL — AB (ref 150–400)
RBC: 5.31 MIL/uL (ref 4.22–5.81)
RDW: 15.1 % (ref 11.5–15.5)
WBC: 6.3 10*3/uL (ref 4.0–10.5)

## 2015-09-29 LAB — BASIC METABOLIC PANEL
Anion gap: 7 (ref 5–15)
BUN: 13 mg/dL (ref 6–20)
CHLORIDE: 108 mmol/L (ref 101–111)
CO2: 25 mmol/L (ref 22–32)
CREATININE: 1.07 mg/dL (ref 0.61–1.24)
Calcium: 8.8 mg/dL — ABNORMAL LOW (ref 8.9–10.3)
GFR calc Af Amer: >60 mL/min (ref >60)
GFR calc non Af Amer: >60 mL/min (ref >60)
Glucose, Bld: 187 mg/dL — ABNORMAL HIGH (ref 65–99)
Potassium: 4.1 mmol/L (ref 3.5–5.1)
SODIUM: 140 mmol/L (ref 135–145)

## 2015-09-29 NOTE — Pre-Procedure Instructions (Signed)
[place] Sr.  09/29/2015      [id number] - [place], [place] - [place] [id number] [place] [id number] Phone: [phone] Fax: [phone]    Your procedure is scheduled on March 8.  Report to [facility] Admitting at 630 A.M.  Call this number if you have problems the morning of surgery:  [phone]   Remember:  Do not eat food or drink liquids after midnight.  Take these medicines the morning of surgery with A SIP OF WATER  Amlodipine (Norvasc), Hydralazine (Apresoline), Hydrocodone-acetaminophen (Norco) if needed  Stop taking aspirin, Ibuprofen, Advil, Motrin, BC's, Goody's, Herbal medications, Fish Oil, Vitamin How to Manage Your Diabetes Before Surgery   Why is it important to control my blood sugar before and after surgery?   Improving blood sugar levels before and after surgery helps healing and can limit problems.  A way of improving blood sugar control is eating a healthy diet by:  - Eating less sugar and carbohydrates  - Increasing activity/exercise  - Talk with your doctor about reaching your blood sugar goals  High blood sugars (greater than 180 mg/dL) can raise your risk of infections and slow down your recovery so you will need to focus on controlling your diabetes during the weeks before surgery.  Make sure that the doctor who takes care of your diabetes knows about your planned surgery including the date and location.  How do I manage my blood sugars before surgery?   Check your blood sugar at least 4 times a day, 2 days before surgery to make sure that they are not too high or low.   Check your blood sugar the morning of your surgery when you wake up and every 2               hours until you get to the Short-Stay unit.  If your blood sugar is less than 70 mg/dL, you will need to treat for low blood sugar by:  Treat a low blood sugar (less than 70 mg/dL) with 1/2 cup of clear juice (cranberry  or apple), 4 glucose tablets, OR glucose gel.  Recheck blood sugar in 15 minutes after treatment (to make sure it is greater than 70 mg/dL).  If blood sugar is not greater than 70 mg/dL on re-check, call [phone] for further instructions.   Report your blood sugar to the Short-Stay nurse when you get to Short-Stay.  References:  [facility], 2007 "How to Manage your Diabetes Before and After Surgery".  What do I do about my diabetes medications?   Do not take oral diabetes medicines (pills) the morning of surgery.(Januvia)  THE NIGHT BEFORE SURGERY, take 24 units of Lantus Insulin. .   Do not wear jewelry, make-up or nail polish.  Do not wear lotions, powders, or perfumes.  You may wear deodorant.  Do not shave 48 hours prior to surgery.  Men may shave face and neck.  Do not bring valuables to the hospital.  [facility] is not responsible for any belongings or valuables.  Contacts, dentures or bridgework may not be worn into surgery.  Leave your suitcase in the car.  After surgery it may be brought to your room.  For patients admitted to the hospital, discharge time will be determined by your treatment team.  Patients discharged the day of surgery will not be allowed to drive home.    Special instructions:  [place] - Preparing  for Surgery  Before surgery, you can play an important role.  Because skin is not sterile, your skin needs to be as free of germs as possible.  You can reduce the number of germs on you skin by washing with CHG (chlorahexidine gluconate) soap before surgery.  CHG is an antiseptic cleaner which kills germs and bonds with the skin to continue killing germs even after washing.  Please DO NOT use if you have an allergy to CHG or antibacterial soaps.  If your skin becomes reddened/irritated stop using the CHG and inform your nurse when you arrive at Short Stay.  Do not shave (including legs and underarms) for at least 48 hours  prior to the first CHG shower.  You may shave your face.  Please follow these instructions carefully:   1.  Shower with CHG Soap the night before surgery and the   morning of Surgery.  2.  If you choose to wash your hair, wash your hair first as usual with your  normal shampoo.  3.  After you shampoo, rinse your hair and body thoroughly to remove the   Shampoo.  4.  Use CHG as you would any other liquid soap.  You can apply chg directly  to the skin and wash gently with scrungie or a clean washcloth.  5.  Apply the CHG Soap to your body ONLY FROM THE NECK DOWN.   Do not use on open wounds or open sores.  Avoid contact with your eyes,    ears, mouth and genitals (private parts).  Wash genitals (private parts)with your normal soap.  6.  Wash thoroughly, paying special attention to the area where your surgery  will be performed.  7.  Thoroughly rinse your body with warm water from the neck down.  8.  DO NOT shower/wash with your normal soap after using and rinsing off  the CHG Soap.  9.  Pat yourself dry with a clean towel.            10.  Wear clean pajamas.            11.  Place clean sheets on your bed the night of your first shower and do not sleep with pets.  Day of Surgery  Do not apply any lotions/deoderants the morning of surgery.  Please wear clean clothes to the hospital/surgery center.     Please read over the following fact sheets that you were given. Pain Booklet, Coughing and Deep Breathing, MRSA Information and Surgical Site Infection Prevention

## 2015-09-29 NOTE — Progress Notes (Signed)
PCP is Dr. Rosita Fire Cardiologist is Dr. [name] Echo and stress test noted in epic from 09-03-15 Denies ever having a card cath. Reports his fasting CBG's run in the 120's

## 2015-09-30 LAB — HEMOGLOBIN A1C
HEMOGLOBIN A1C: 9.3 % — AB (ref 4.8–5.6)
Mean Plasma Glucose: 220 mg/dL

## 2015-09-30 NOTE — Progress Notes (Signed)
Anesthesia chart review: Patient is a 65 year old male scheduled for right L4-5, L5-S1 laminectomy on 10/07/15 by Dr. Joya Salm.  History includes never smoker, diabetes mellitus type 2, hypertension, shortness of breath, BPH, arthritis. PCP is Dr. Rosita Fire. Cardiologist is Dr. [name], recent stress and echo to evaluate for dyspnea (see below).   Meds include amlodipine, aspirin, enalapril, hydralazine, Norco, Lantus, Januvia, KCl, tetrahydrozoline ophthalmic, vitamin E.  09/29/15 EKG: NSR, RSR prime or QR pattern in V1 suggests RVCD, possible anterior infarct (age undetermined).  09/03/15 Nuclear stress test:  No diagnostic ST segment changes by standard criteria. Low risk Duke treadmill score of 8.  Blood pressure demonstrated a hypertensive response to exercise.  Small, mild intensity defects at the anterior apex and apical to basal inferolateral wall. These are fixed and most consistent with soft tissue attenuation.  This is a low risk study.  Nuclear stress EF: 52%.  09/03/15 Echo: Impressions: - Moderate LVH with LVEF 60-65%, no definite wall motion abnormalities with somewhat limited images. Grade 2 diastolic dysfunction. Upper normal left atrial chamber size. Mildlydilated aortic root (42 mm). Mildly calcified aortic valve. Trivial mitral and tricuspid regurgitation.  02/04/15 CXR: IMPRESSION: No acute cardiopulmonary disease. Stable cardiomegaly.   Preoperative labs noted. A1c 9.3 (left message with Janett Billow at Dr. Joya Salm regarding elevated A1c). He reported that his fasting CBGs run in the 120's.   If no acute changes and fasting CBG acceptable then I would anticipate that he could proceed as planned.  George Hugh [facility] Short Stay Center/Anesthesiology Phone [phone] 09/30/2015 2:41 PM

## 2015-10-06 MED ORDER — DEXTROSE 5 % IV SOLN
3.0000 g | INTRAVENOUS | Status: AC
  Administered 2015-10-07: 3 g via INTRAVENOUS
  Filled 2015-10-06: qty 3000

## 2015-10-06 NOTE — H&P (Signed)
Vincent E G[place] Sr. is an 65 y.o. male.   Chief Complaint:right leg pain HPI: patient complaining of right leg pain with weakness after an injury at work. He has failed with conservative treatment. He denies any pain whatsoever in the left leg. Lumbar myelogram was positive for stenosis from l4 to s1  Past Medical History  Diagnosis Date  . Diabetes mellitus   . Hypertension   . Shortness of breath   . Pneumonia   . Arthritis   . Enlarged prostate     Past Surgical History  Procedure Laterality Date  . Laceration repair      left hand  . Circumcision    . Hydrocele excision  10/14/2011    Procedure: HYDROCELECTOMY ADULT;  Surgeon: Malka So, MD;  Location: [facility];  Service: Urology;  Laterality: Right;    Family History  Problem Relation Age of Onset  . Anesthesia problems Neg Hx   . Hypotension Neg Hx   . Malignant hyperthermia Neg Hx   . Pseudochol deficiency Neg Hx   . Diabetes    . Hypertension    . CAD    . Cancer     Social History:  reports that he has never smoked. He does not have any smokeless tobacco history on file. He reports that he does not drink alcohol or use illicit drugs.  Allergies:  Allergies  Allergen Reactions  . Metformin And Related Palpitations    Make his heart race    No prescriptions prior to admission    No results found for this or any previous visit (from the past 48 hour(s)). No results found.  Review of Systems  Constitutional: Negative.   Eyes: Negative.   Respiratory: Positive for shortness of breath.   Cardiovascular: Negative.   Gastrointestinal: Negative.   Genitourinary: Negative.   Musculoskeletal: Positive for back pain and neck pain.  Neurological: Positive for sensory change and focal weakness.  Endo/Heme/Allergies: Negative.   Psychiatric/Behavioral: Negative.     There were no vitals taken for this visit. Physical Exam  Hent, nl. Neck minimal pain with mobility. Cv, nl. Lungs, some rales abdomen, nl.  Extremities nl. NEURO weakness of df on the right foot. sle positive at 60  Degrees. Assessment/Plan Patient to go ahead with right decompression at l4-5, l5s1. He is aware of risks and benefits  Floyce Stakes, MD 10/06/2015, 4:29 PM

## 2015-10-07 ENCOUNTER — Ambulatory Visit (HOSPITAL_COMMUNITY): Payer: PPO | Admitting: Vascular Surgery

## 2015-10-07 ENCOUNTER — Ambulatory Visit (HOSPITAL_COMMUNITY): Payer: PPO

## 2015-10-07 ENCOUNTER — Encounter (HOSPITAL_COMMUNITY)
Admission: AD | Disposition: A | Discharge status: Home / Self Care | Payer: Self-pay | Source: Ambulatory Visit | Attending: Neurosurgery

## 2015-10-07 ENCOUNTER — Inpatient Hospital Stay (HOSPITAL_COMMUNITY)
Admission: AD | Admit: 2015-10-07 | Discharge: 2015-10-12 | DRG: 516 | Disposition: A | Discharge status: Home / Self Care | Payer: PPO | Source: Ambulatory Visit | Attending: Neurosurgery | Admitting: Neurosurgery

## 2015-10-07 ENCOUNTER — Encounter (HOSPITAL_COMMUNITY): Payer: Self-pay | Admitting: *Deleted

## 2015-10-07 ENCOUNTER — Ambulatory Visit (HOSPITAL_COMMUNITY): Payer: PPO | Admitting: Anesthesiology

## 2015-10-07 DIAGNOSIS — Z9889 Other specified postprocedural states: Secondary | ICD-10-CM | POA: Diagnosis not present

## 2015-10-07 DIAGNOSIS — M5417 Radiculopathy, lumbosacral region: Secondary | ICD-10-CM | POA: Diagnosis not present

## 2015-10-07 DIAGNOSIS — I10 Essential (primary) hypertension: Secondary | ICD-10-CM | POA: Diagnosis not present

## 2015-10-07 DIAGNOSIS — M48061 Spinal stenosis, lumbar region without neurogenic claudication: Secondary | ICD-10-CM | POA: Diagnosis present

## 2015-10-07 DIAGNOSIS — M1990 Unspecified osteoarthritis, unspecified site: Secondary | ICD-10-CM | POA: Diagnosis not present

## 2015-10-07 DIAGNOSIS — M4807 Spinal stenosis, lumbosacral region: Secondary | ICD-10-CM | POA: Diagnosis not present

## 2015-10-07 DIAGNOSIS — Z6841 Body Mass Index (BMI) 40.0 and over, adult: Secondary | ICD-10-CM

## 2015-10-07 DIAGNOSIS — R0602 Shortness of breath: Secondary | ICD-10-CM | POA: Diagnosis not present

## 2015-10-07 DIAGNOSIS — Z419 Encounter for procedure for purposes other than remedying health state, unspecified: Secondary | ICD-10-CM

## 2015-10-07 DIAGNOSIS — M4806 Spinal stenosis, lumbar region: Principal | ICD-10-CM | POA: Diagnosis present

## 2015-10-07 DIAGNOSIS — M79604 Pain in right leg: Secondary | ICD-10-CM | POA: Diagnosis not present

## 2015-10-07 DIAGNOSIS — M5416 Radiculopathy, lumbar region: Secondary | ICD-10-CM | POA: Diagnosis present

## 2015-10-07 DIAGNOSIS — E119 Type 2 diabetes mellitus without complications: Secondary | ICD-10-CM | POA: Diagnosis present

## 2015-10-07 HISTORY — PX: LUMBAR LAMINECTOMY/DECOMPRESSION MICRODISCECTOMY: SHX5026

## 2015-10-07 HISTORY — DX: Spinal stenosis, lumbar region without neurogenic claudication: M48.061

## 2015-10-07 LAB — GLUCOSE, CAPILLARY
GLUCOSE-CAPILLARY: 156 mg/dL — AB (ref 65–99)
Glucose-Capillary: 175 mg/dL — ABNORMAL HIGH (ref 65–99)
Glucose-Capillary: 132 mg/dL — ABNORMAL HIGH (ref 65–99)

## 2015-10-07 SURGERY — LUMBAR LAMINECTOMY/DECOMPRESSION MICRODISCECTOMY 2 LEVELS
Anesthesia: General | Laterality: Right

## 2015-10-07 MED ORDER — VANCOMYCIN HCL 1000 MG IV SOLR
INTRAVENOUS | Status: DC | PRN
  Administered 2015-10-07: 1000 mg

## 2015-10-07 MED ORDER — BUPIVACAINE LIPOSOME 1.3 % IJ SUSP
INTRAMUSCULAR | Status: DC | PRN
  Administered 2015-10-07: 20 mL

## 2015-10-07 MED ORDER — FENTANYL CITRATE (PF) 100 MCG/2ML IJ SOLN
INTRAMUSCULAR | Status: DC | PRN
  Administered 2015-10-07: 100 ug via INTRAVENOUS
  Administered 2015-10-07: 150 ug via INTRAVENOUS

## 2015-10-07 MED ORDER — PHENYLEPHRINE 40 MCG/ML (10ML) SYRINGE FOR IV PUSH (FOR BLOOD PRESSURE SUPPORT)
PREFILLED_SYRINGE | INTRAVENOUS | Status: AC
  Filled 2015-10-07: qty 10

## 2015-10-07 MED ORDER — DIPHENHYDRAMINE HCL 12.5 MG/5ML PO ELIX: 12.5000 mg | ORAL_SOLUTION | Freq: Four times a day (QID) | ORAL | Status: DC | PRN

## 2015-10-07 MED ORDER — MORPHINE SULFATE 2 MG/ML IV SOLN
INTRAVENOUS | Status: AC
  Filled 2015-10-07: qty 25

## 2015-10-07 MED ORDER — BUPIVACAINE LIPOSOME 1.3 % IJ SUSP
20.0000 mL | INTRAMUSCULAR | Status: AC
  Filled 2015-10-07: qty 20

## 2015-10-07 MED ORDER — SODIUM CHLORIDE 0.9% FLUSH: 3.0000 mL | INTRAVENOUS | Status: DC | PRN

## 2015-10-07 MED ORDER — ENALAPRIL MALEATE 20 MG PO TABS
20.0000 mg | ORAL_TABLET | Freq: Every day | ORAL | Status: DC
  Administered 2015-10-08 – 2015-10-11 (×4): 20 mg via ORAL
  Filled 2015-10-07 (×11): qty 1

## 2015-10-07 MED ORDER — LACTATED RINGERS IV SOLN: INTRAVENOUS | Status: DC

## 2015-10-07 MED ORDER — LIDOCAINE HCL (CARDIAC) 20 MG/ML IV SOLN
INTRAVENOUS | Status: DC | PRN
  Administered 2015-10-07: 50 mg via INTRAVENOUS

## 2015-10-07 MED ORDER — PROPOFOL 10 MG/ML IV BOLUS
INTRAVENOUS | Status: DC | PRN
  Administered 2015-10-07: 180 mg via INTRAVENOUS

## 2015-10-07 MED ORDER — SODIUM CHLORIDE 0.9% FLUSH: 9.0000 mL | INTRAVENOUS | Status: DC | PRN

## 2015-10-07 MED ORDER — ACETAMINOPHEN 325 MG PO TABS
650.0000 mg | ORAL_TABLET | ORAL | Status: DC | PRN
  Administered 2015-10-12: 650 mg via ORAL
  Filled 2015-10-07: qty 2

## 2015-10-07 MED ORDER — SODIUM CHLORIDE 0.9 % IV SOLN
INTRAVENOUS | Status: DC
  Administered 2015-10-07 – 2015-10-10 (×2): via INTRAVENOUS

## 2015-10-07 MED ORDER — LACTATED RINGERS IV SOLN
INTRAVENOUS | Status: DC | PRN
  Administered 2015-10-07 (×2): via INTRAVENOUS

## 2015-10-07 MED ORDER — LINAGLIPTIN 5 MG PO TABS
5.0000 mg | ORAL_TABLET | Freq: Every day | ORAL | Status: DC
  Administered 2015-10-08 – 2015-10-12 (×5): 5 mg via ORAL
  Filled 2015-10-07 (×5): qty 1

## 2015-10-07 MED ORDER — DOCUSATE SODIUM 100 MG PO CAPS
100.0000 mg | ORAL_CAPSULE | Freq: Two times a day (BID) | ORAL | Status: DC
  Administered 2015-10-07 – 2015-10-12 (×10): 100 mg via ORAL
  Filled 2015-10-07 (×10): qty 1

## 2015-10-07 MED ORDER — SUGAMMADEX SODIUM 500 MG/5ML IV SOLN
INTRAVENOUS | Status: AC
  Filled 2015-10-07: qty 5

## 2015-10-07 MED ORDER — ACETAMINOPHEN 650 MG RE SUPP: 650.0000 mg | RECTAL | Status: DC | PRN

## 2015-10-07 MED ORDER — ROCURONIUM BROMIDE 100 MG/10ML IV SOLN
INTRAVENOUS | Status: DC | PRN
  Administered 2015-10-07: 20 mg via INTRAVENOUS
  Administered 2015-10-07: 30 mg via INTRAVENOUS

## 2015-10-07 MED ORDER — THROMBIN 5000 UNITS EX SOLR
OROMUCOSAL | Status: DC | PRN
  Administered 2015-10-07 (×2): via TOPICAL

## 2015-10-07 MED ORDER — SODIUM CHLORIDE 0.9 % IV SOLN: 250.0000 mL | INTRAVENOUS | Status: DC

## 2015-10-07 MED ORDER — GLYCOPYRROLATE 0.2 MG/ML IJ SOLN
INTRAMUSCULAR | Status: DC | PRN
  Administered 2015-10-07: 0.2 mg via INTRAVENOUS

## 2015-10-07 MED ORDER — EPHEDRINE SULFATE 50 MG/ML IJ SOLN
INTRAMUSCULAR | Status: AC
  Filled 2015-10-07: qty 1

## 2015-10-07 MED ORDER — SUCCINYLCHOLINE CHLORIDE 20 MG/ML IJ SOLN
INTRAMUSCULAR | Status: AC
  Filled 2015-10-07: qty 1

## 2015-10-07 MED ORDER — MENTHOL 3 MG MT LOZG: 1.0000 | LOZENGE | OROMUCOSAL | Status: DC | PRN

## 2015-10-07 MED ORDER — NALOXONE HCL 0.4 MG/ML IJ SOLN: 0.4000 mg | INTRAMUSCULAR | Status: DC | PRN

## 2015-10-07 MED ORDER — EPHEDRINE SULFATE 50 MG/ML IJ SOLN
INTRAMUSCULAR | Status: DC | PRN
  Administered 2015-10-07: 5 mg via INTRAVENOUS

## 2015-10-07 MED ORDER — PHENOL 1.4 % MT LIQD: 1.0000 | OROMUCOSAL | Status: DC | PRN

## 2015-10-07 MED ORDER — OXYCODONE-ACETAMINOPHEN 5-325 MG PO TABS
ORAL_TABLET | ORAL | Status: AC
  Filled 2015-10-07: qty 2

## 2015-10-07 MED ORDER — HEMOSTATIC AGENTS (NO CHARGE) OPTIME
TOPICAL | Status: DC | PRN
  Administered 2015-10-07: 1 via TOPICAL

## 2015-10-07 MED ORDER — INSULIN ASPART 100 UNIT/ML ~~LOC~~ SOLN
0.0000 [IU] | Freq: Every day | SUBCUTANEOUS | Status: DC
  Administered 2015-10-09: 3 [IU] via SUBCUTANEOUS
  Administered 2015-10-11 (×2): 2 [IU] via SUBCUTANEOUS

## 2015-10-07 MED ORDER — SODIUM CHLORIDE 0.9% FLUSH
3.0000 mL | Freq: Two times a day (BID) | INTRAVENOUS | Status: DC
  Administered 2015-10-08 – 2015-10-11 (×2): 3 mL via INTRAVENOUS

## 2015-10-07 MED ORDER — SUGAMMADEX SODIUM 200 MG/2ML IV SOLN
INTRAVENOUS | Status: DC | PRN
  Administered 2015-10-07: 249 mg via INTRAVENOUS

## 2015-10-07 MED ORDER — DIAZEPAM 5 MG PO TABS
ORAL_TABLET | ORAL | Status: AC
  Filled 2015-10-07: qty 2

## 2015-10-07 MED ORDER — HYDROMORPHONE HCL 1 MG/ML IJ SOLN
INTRAMUSCULAR | Status: AC
  Filled 2015-10-07: qty 1

## 2015-10-07 MED ORDER — ONDANSETRON HCL 4 MG/2ML IJ SOLN: 4.0000 mg | Freq: Four times a day (QID) | INTRAMUSCULAR | Status: DC | PRN

## 2015-10-07 MED ORDER — THROMBIN 5000 UNITS EX SOLR
CUTANEOUS | Status: DC | PRN
  Administered 2015-10-07 (×2): 5000 [IU] via TOPICAL

## 2015-10-07 MED ORDER — ZOLPIDEM TARTRATE 5 MG PO TABS
5.0000 mg | ORAL_TABLET | Freq: Every evening | ORAL | Status: DC | PRN
  Administered 2015-10-07 – 2015-10-11 (×4): 5 mg via ORAL
  Filled 2015-10-07 (×4): qty 1

## 2015-10-07 MED ORDER — VANCOMYCIN HCL 1000 MG IV SOLR
INTRAVENOUS | Status: AC
  Filled 2015-10-07: qty 1000

## 2015-10-07 MED ORDER — OXYCODONE-ACETAMINOPHEN 5-325 MG PO TABS
1.0000 | ORAL_TABLET | ORAL | Status: DC | PRN
  Administered 2015-10-07 – 2015-10-12 (×8): 2 via ORAL
  Filled 2015-10-07 (×7): qty 2

## 2015-10-07 MED ORDER — ONDANSETRON HCL 4 MG/2ML IJ SOLN: 4.0000 mg | INTRAMUSCULAR | Status: DC | PRN

## 2015-10-07 MED ORDER — MORPHINE SULFATE 2 MG/ML IV SOLN
INTRAVENOUS | Status: DC
  Administered 2015-10-07: 13:00:00 via INTRAVENOUS
  Administered 2015-10-07: 6 mg via INTRAVENOUS
  Administered 2015-10-07: 4.5 mg via INTRAVENOUS
  Administered 2015-10-08: 0.1 mg via INTRAVENOUS
  Administered 2015-10-08: 2 mg via INTRAVENOUS
  Administered 2015-10-08: 4.5 mg via INTRAVENOUS
  Administered 2015-10-09 (×2): 2 mg via INTRAVENOUS
  Administered 2015-10-09: 6 mg via INTRAVENOUS
  Administered 2015-10-09: 2 mg via INTRAVENOUS
  Administered 2015-10-09: 3 mg via INTRAVENOUS
  Administered 2015-10-09: 4.5 mg via INTRAVENOUS
  Administered 2015-10-10: 3 mg via INTRAVENOUS
  Administered 2015-10-10: 2 mg via INTRAVENOUS
  Administered 2015-10-10: 6 mg via INTRAVENOUS
  Filled 2015-10-07 (×2): qty 25

## 2015-10-07 MED ORDER — DIPHENHYDRAMINE HCL 50 MG/ML IJ SOLN: 12.5000 mg | Freq: Four times a day (QID) | INTRAMUSCULAR | Status: DC | PRN

## 2015-10-07 MED ORDER — MIDAZOLAM HCL 2 MG/2ML IJ SOLN
INTRAMUSCULAR | Status: AC
  Filled 2015-10-07: qty 2

## 2015-10-07 MED ORDER — DIAZEPAM 5 MG PO TABS
10.0000 mg | ORAL_TABLET | Freq: Four times a day (QID) | ORAL | Status: DC | PRN
  Administered 2015-10-07 – 2015-10-10 (×5): 10 mg via ORAL
  Filled 2015-10-07 (×5): qty 2

## 2015-10-07 MED ORDER — HYDRALAZINE HCL 50 MG PO TABS
50.0000 mg | ORAL_TABLET | Freq: Two times a day (BID) | ORAL | Status: DC
  Administered 2015-10-07 – 2015-10-12 (×10): 50 mg via ORAL
  Filled 2015-10-07 (×10): qty 1

## 2015-10-07 MED ORDER — SUCCINYLCHOLINE CHLORIDE 20 MG/ML IJ SOLN
INTRAMUSCULAR | Status: DC | PRN
  Administered 2015-10-07: 140 mg via INTRAVENOUS

## 2015-10-07 MED ORDER — AMLODIPINE BESYLATE 5 MG PO TABS
5.0000 mg | ORAL_TABLET | Freq: Every day | ORAL | Status: DC
  Administered 2015-10-07 – 2015-10-12 (×6): 5 mg via ORAL
  Filled 2015-10-07 (×6): qty 1

## 2015-10-07 MED ORDER — ASPIRIN EC 81 MG PO TBEC
81.0000 mg | DELAYED_RELEASE_TABLET | Freq: Every day | ORAL | Status: DC
  Administered 2015-10-08 – 2015-10-12 (×5): 81 mg via ORAL
  Filled 2015-10-07 (×6): qty 1

## 2015-10-07 MED ORDER — LIDOCAINE HCL (CARDIAC) 20 MG/ML IV SOLN
INTRAVENOUS | Status: AC
  Filled 2015-10-07: qty 5

## 2015-10-07 MED ORDER — NEOSTIGMINE METHYLSULFATE 10 MG/10ML IV SOLN
INTRAVENOUS | Status: DC | PRN
  Administered 2015-10-07: 4 mg via INTRAVENOUS

## 2015-10-07 MED ORDER — SENNA 8.6 MG PO TABS
1.0000 | ORAL_TABLET | Freq: Two times a day (BID) | ORAL | Status: DC
  Administered 2015-10-07 – 2015-10-12 (×10): 8.6 mg via ORAL
  Filled 2015-10-07 (×11): qty 1

## 2015-10-07 MED ORDER — CEFAZOLIN SODIUM-DEXTROSE 2-3 GM-% IV SOLR
2.0000 g | Freq: Three times a day (TID) | INTRAVENOUS | Status: AC
  Administered 2015-10-07 (×2): 2 g via INTRAVENOUS
  Filled 2015-10-07 (×3): qty 50

## 2015-10-07 MED ORDER — POTASSIUM CHLORIDE CRYS ER 20 MEQ PO TBCR
20.0000 meq | EXTENDED_RELEASE_TABLET | Freq: Every day | ORAL | Status: DC
  Administered 2015-10-07 – 2015-10-12 (×6): 20 meq via ORAL
  Filled 2015-10-07 (×6): qty 1

## 2015-10-07 MED ORDER — MEPERIDINE HCL 25 MG/ML IJ SOLN: 6.2500 mg | INTRAMUSCULAR | Status: DC | PRN

## 2015-10-07 MED ORDER — 0.9 % SODIUM CHLORIDE (POUR BTL) OPTIME
TOPICAL | Status: DC | PRN
  Administered 2015-10-07: 1000 mL

## 2015-10-07 MED ORDER — ROCURONIUM BROMIDE 50 MG/5ML IV SOLN
INTRAVENOUS | Status: AC
  Filled 2015-10-07: qty 1

## 2015-10-07 MED ORDER — ONDANSETRON HCL 4 MG/2ML IJ SOLN
INTRAMUSCULAR | Status: AC
  Filled 2015-10-07: qty 2

## 2015-10-07 MED ORDER — INSULIN ASPART 100 UNIT/ML ~~LOC~~ SOLN
0.0000 [IU] | Freq: Three times a day (TID) | SUBCUTANEOUS | Status: DC
  Administered 2015-10-07: 3 [IU] via SUBCUTANEOUS
  Administered 2015-10-08 (×2): 7 [IU] via SUBCUTANEOUS
  Administered 2015-10-08: 11 [IU] via SUBCUTANEOUS
  Administered 2015-10-09 – 2015-10-11 (×6): 7 [IU] via SUBCUTANEOUS
  Administered 2015-10-11: 11 [IU] via SUBCUTANEOUS
  Administered 2015-10-11: 7 [IU] via SUBCUTANEOUS
  Administered 2015-10-12: 11 [IU] via SUBCUTANEOUS

## 2015-10-07 MED ORDER — PROMETHAZINE HCL 25 MG/ML IJ SOLN: 6.2500 mg | INTRAMUSCULAR | Status: DC | PRN

## 2015-10-07 MED ORDER — FENTANYL CITRATE (PF) 250 MCG/5ML IJ SOLN
INTRAMUSCULAR | Status: AC
  Filled 2015-10-07: qty 5

## 2015-10-07 MED ORDER — HYDROMORPHONE HCL 1 MG/ML IJ SOLN
0.2500 mg | INTRAMUSCULAR | Status: DC | PRN
  Administered 2015-10-07 (×4): 0.5 mg via INTRAVENOUS

## 2015-10-07 MED ORDER — GLYCOPYRROLATE 0.2 MG/ML IJ SOLN
INTRAMUSCULAR | Status: AC
  Filled 2015-10-07: qty 1

## 2015-10-07 MED ORDER — PHENYLEPHRINE HCL 10 MG/ML IJ SOLN
INTRAMUSCULAR | Status: DC | PRN
  Administered 2015-10-07 (×3): 80 ug via INTRAVENOUS

## 2015-10-07 MED ORDER — PROPOFOL 10 MG/ML IV BOLUS
INTRAVENOUS | Status: AC
  Filled 2015-10-07: qty 20

## 2015-10-07 SURGICAL SUPPLY — 63 items
APL SKNCLS STERI-STRIP NONHPOA (GAUZE/BANDAGES/DRESSINGS) ×1
BENZOIN TINCTURE PRP APPL 2/3 (GAUZE/BANDAGES/DRESSINGS) ×2 IMPLANT
BLADE CLIPPER SURG (BLADE) IMPLANT
BUR ACORN 6.0 (BURR) ×5 IMPLANT
BUR MATCHSTICK NEURO 3.0 LAGG (BURR) ×2 IMPLANT
CANISTER SUCT 3000ML PPV (MISCELLANEOUS) ×2 IMPLANT
DRAPE LAPAROTOMY 100X72X124 (DRAPES) ×2 IMPLANT
DRAPE MICROSCOPE LEICA (MISCELLANEOUS) ×2 IMPLANT
DRAPE POUCH INSTRU U-SHP 10X18 (DRAPES) ×2 IMPLANT
DRSG OPSITE POSTOP 4X6 (GAUZE/BANDAGES/DRESSINGS) ×1 IMPLANT
DRSG PAD ABDOMINAL 8X10 ST (GAUZE/BANDAGES/DRESSINGS) IMPLANT
DURAPREP 26ML APPLICATOR (WOUND CARE) ×2 IMPLANT
ELECT BLADE 4.0 EZ CLEAN MEGAD (MISCELLANEOUS) ×2
ELECT REM PT RETURN 9FT ADLT (ELECTROSURGICAL) ×2
ELECTRODE BLDE 4.0 EZ CLN MEGD (MISCELLANEOUS) IMPLANT
ELECTRODE REM PT RTRN 9FT ADLT (ELECTROSURGICAL) ×1 IMPLANT
EVACUATOR 3/16  PVC DRAIN (DRAIN) ×1
EVACUATOR 3/16 PVC DRAIN (DRAIN) IMPLANT
GAUZE SPONGE 4X4 12PLY STRL (GAUZE/BANDAGES/DRESSINGS) ×2 IMPLANT
GAUZE SPONGE 4X4 16PLY XRAY LF (GAUZE/BANDAGES/DRESSINGS) IMPLANT
GLOVE BIOGEL M 8.0 STRL (GLOVE) ×2 IMPLANT
GLOVE EXAM NITRILE LRG STRL (GLOVE) IMPLANT
GLOVE EXAM NITRILE MD LF STRL (GLOVE) IMPLANT
GLOVE EXAM NITRILE XL STR (GLOVE) IMPLANT
GLOVE EXAM NITRILE XS STR PU (GLOVE) IMPLANT
GLOVE INDICATOR 7.0 STRL GRN (GLOVE) ×1 IMPLANT
GLOVE INDICATOR 7.5 STRL GRN (GLOVE) ×1 IMPLANT
GLOVE INDICATOR 8.0 STRL GRN (GLOVE) ×1 IMPLANT
GLOVE SURG SS PI 6.5 STRL IVOR (GLOVE) ×2 IMPLANT
GOWN STRL REUS W/ TWL LRG LVL3 (GOWN DISPOSABLE) ×1 IMPLANT
GOWN STRL REUS W/ TWL XL LVL3 (GOWN DISPOSABLE) IMPLANT
GOWN STRL REUS W/TWL 2XL LVL3 (GOWN DISPOSABLE) IMPLANT
GOWN STRL REUS W/TWL LRG LVL3 (GOWN DISPOSABLE) ×2
GOWN STRL REUS W/TWL XL LVL3 (GOWN DISPOSABLE)
HEMOSTAT POWDER SURGIFOAM 1G (HEMOSTASIS) ×2 IMPLANT
KIT BASIN OR (CUSTOM PROCEDURE TRAY) ×2 IMPLANT
KIT ROOM TURNOVER OR (KITS) ×2 IMPLANT
NDL HYPO 18GX1.5 BLUNT FILL (NEEDLE) IMPLANT
NDL HYPO 21X1.5 SAFETY (NEEDLE) IMPLANT
NDL HYPO 25X1 1.5 SAFETY (NEEDLE) IMPLANT
NDL SPNL 20GX3.5 QUINCKE YW (NEEDLE) IMPLANT
NEEDLE HYPO 18GX1.5 BLUNT FILL (NEEDLE) IMPLANT
NEEDLE HYPO 21X1.5 SAFETY (NEEDLE) IMPLANT
NEEDLE HYPO 25X1 1.5 SAFETY (NEEDLE) IMPLANT
NEEDLE SPNL 20GX3.5 QUINCKE YW (NEEDLE) IMPLANT
NS IRRIG 1000ML POUR BTL (IV SOLUTION) ×2 IMPLANT
PACK LAMINECTOMY NEURO (CUSTOM PROCEDURE TRAY) ×2 IMPLANT
PAD ARMBOARD 7.5X6 YLW CONV (MISCELLANEOUS) ×6 IMPLANT
PATTIES SURGICAL .5 X.5 (GAUZE/BANDAGES/DRESSINGS) ×1 IMPLANT
PATTIES SURGICAL .5 X1 (DISPOSABLE) ×2 IMPLANT
PATTIES SURGICAL 1X1 (DISPOSABLE) ×1 IMPLANT
RUBBERBAND STERILE (MISCELLANEOUS) ×4 IMPLANT
SPONGE LAP 4X18 X RAY DECT (DISPOSABLE) IMPLANT
SPONGE SURGIFOAM ABS GEL SZ50 (HEMOSTASIS) ×2 IMPLANT
STRIP CLOSURE SKIN 1/2X4 (GAUZE/BANDAGES/DRESSINGS) ×2 IMPLANT
SUT VIC AB 0 CT1 18XCR BRD8 (SUTURE) ×1 IMPLANT
SUT VIC AB 0 CT1 8-18 (SUTURE) ×2
SUT VIC AB 2-0 CP2 18 (SUTURE) ×3 IMPLANT
SUT VIC AB 3-0 SH 8-18 (SUTURE) ×2 IMPLANT
SYR 5ML LL (SYRINGE) IMPLANT
TOWEL OR 17X24 6PK STRL BLUE (TOWEL DISPOSABLE) ×2 IMPLANT
TOWEL OR 17X26 10 PK STRL BLUE (TOWEL DISPOSABLE) ×2 IMPLANT
WATER STERILE IRR 1000ML POUR (IV SOLUTION) ×2 IMPLANT

## 2015-10-07 NOTE — Transfer of Care (Signed)
Immediate Anesthesia Transfer of Care Note  Patient: Vincent Ber[place] Sr.  Procedure(s) Performed: Procedure(s): Right Lumbar four-five ,lumbar five sacral-one Laminectomy (Right)  Patient Location: PACU  Anesthesia Type:General  Level of Consciousness: awake, alert  and oriented  Airway & Oxygen Therapy: Patient Spontanous Breathing and Patient connected to nasal cannula oxygen  Post-op Assessment: Report given to RN and Post -op Vital signs reviewed and stable  Post vital signs: Reviewed and stable  Last Vitals:  Filed Vitals:   10/07/15 0702  BP: 181/80  Pulse: 86  Temp: 36.9 C  Resp: 18    Complications: No apparent anesthesia complications

## 2015-10-07 NOTE — Evaluation (Signed)
Physical Therapy Evaluation Patient Details Name: Vincent Friedrichs [place] Sr. MRN: [id number] DOB: April 15, 1951 Today's Date: 10/07/2015   History of Present Illness  65 y.o. male s/p Right Lumbar four-five ,lumbar five sacral-one Laminectomy. Hx of DM and HTN  Clinical Impression  Patient is s/p above surgery presenting with functional limitations due to the deficits listed below (see PT Problem List). Reviewed safe mobility techniques to protect spine including log roll and short distance gait. Mildly antalgic gait pattern without buckling or overt loss of balance. Reliance on RW for support today. Anticipate pt will progress well. Wife will provide assistance at home as needed. Very supportive during evaluation. Patient will benefit from skilled PT to increase their independence and safety with mobility to allow discharge to the venue listed below.       Follow Up Recommendations No PT follow up (OPPT when cleared by MD)    Equipment Recommendations  Rolling walker with 5" wheels (pending progress)    Recommendations for Other Services OT consult     Precautions / Restrictions Precautions Precautions: Back Precaution Booklet Issued: No Precaution Comments: Verbally reviewed back precautions for comfort. No precautions ordered. Required Braces or Orthoses:  ("No brace needed" in order set, also given in report to RN) Restrictions Weight Bearing Restrictions: No      Mobility  Bed Mobility Overal bed mobility: Needs Assistance Bed Mobility: Rolling;Sidelying to Sit;Sit to Sidelying Rolling: Min guard Sidelying to sit: Min guard     Sit to sidelying: Min guard General bed mobility comments: Min gaurd for safety. Slowly took pt through log roll technique to get in/out of bed. Did use rail for support.  Transfers Overall transfer level: Needs assistance Equipment used: Straight cane Transfers: Sit to/from Stand Sit to Stand: Min guard         General transfer comment: Min guard  for safety. Slow to rise. VC for posture and hand placement. Once upright with cane pt felt unsteady, threfore RW introduced and pt felt more confident.  Ambulation/Gait Ambulation/Gait assistance: Min guard Ambulation Distance (Feet): 30 Feet Assistive device: Rolling walker (2 wheeled) Gait Pattern/deviations: Step-through pattern;Decreased stride length;Antalgic;Trunk flexed;Decreased stance time - right Gait velocity: decreased Gait velocity interpretation: Below normal speed for age/gender General Gait Details: Mildly antalgic gait pattern. Very guarded with minimal instability noted in Rt stance phase. Utilizing RW appropriately and adjusted for height with education for proper use. occasional cues for forward gaze and upright posture. No physical assist required for short distance.  Stairs            Wheelchair Mobility    Modified Rankin (Stroke Patients Only)       Balance Overall balance assessment: Needs assistance Sitting-balance support: No upper extremity supported;Feet supported Sitting balance-Leahy Scale: Normal     Standing balance support: No upper extremity supported Standing balance-Leahy Scale: Fair                               Pertinent Vitals/Pain Pain Assessment: Faces Faces Pain Scale: Hurts even more Pain Location: back Pain Descriptors / Indicators: Aching Pain Intervention(s): Monitored during session;Repositioned    Home Living Family/patient expects to be discharged to:: Private residence Living Arrangements: Spouse/significant other Available Help at Discharge: Family;Available 24 hours/day (avail 24/7 for a few days) Type of Home: House Home Access: Stairs to enter Entrance Stairs-Rails: Right;Left (split) Entrance Stairs-Number of Steps: 4 Home Layout: One level Home Equipment: Cane - single point (potentially  has RW)      Prior Function Level of Independence: Independent with assistive device(s)          Comments: using cane for mobility     Hand Dominance        Extremity/Trunk Assessment   Upper Extremity Assessment: Defer to OT evaluation           Lower Extremity Assessment: Overall WFL for tasks assessed (Grossly equal, feels light touch throughout.)         Communication   Communication: No difficulties  Cognition Arousal/Alertness: Awake/alert Behavior During Therapy: WFL for tasks assessed/performed Overall Cognitive Status: Within Functional Limits for tasks assessed                      General Comments General comments (skin integrity, edema, etc.): Reviewed precautions for comfort with pt and wife.    Exercises        Assessment/Plan    PT Assessment Patient needs continued PT services  PT Diagnosis Difficulty walking;Abnormality of gait;Acute pain   PT Problem List Decreased strength;Decreased activity tolerance;Decreased balance;Decreased mobility;Decreased knowledge of use of DME;Decreased knowledge of precautions;Obesity;Pain  PT Treatment Interventions DME instruction;Gait training;Stair training;Functional mobility training;Therapeutic activities;Therapeutic exercise;Balance training;Neuromuscular re-education;Patient/family education   PT Goals (Current goals can be found in the Care Plan section) Acute Rehab PT Goals Patient Stated Goal: None stated PT Goal Formulation: With patient Time For Goal Achievement: 10/21/15 Potential to Achieve Goals: Good    Frequency Min 5X/week   Barriers to discharge        Co-evaluation               End of Session Equipment Utilized During Treatment: Gait belt Activity Tolerance: Patient tolerated treatment well Patient left: in bed;with call bell/phone within reach;with bed alarm set;with family/visitor present Nurse Communication: Mobility status         Time: 1744-1810 PT Time Calculation (min) (ACUTE ONLY): 26 min   Charges:   PT Evaluation $PT Eval Low Complexity: 1  Procedure PT Treatments $Therapeutic Activity: 8-22 mins   PT G CodesEllouise Newer 10/07/2015, 6:30 PM  Camille Bal [place], [place]

## 2015-10-07 NOTE — Progress Notes (Signed)
Patient ID: Vincent [place] Sr., male   DOB: June 22, 1951, 65 y.o.   MRN: [id number] C/o incisional pain. No weakness

## 2015-10-07 NOTE — Anesthesia Preprocedure Evaluation (Addendum)
Anesthesia Evaluation  Patient identified by MRN, date of birth, ID band Patient awake    Reviewed: Allergy & Precautions, NPO status , Patient's Chart, lab work & pertinent test results  Airway Mallampati: III  TM Distance: >3 FB Neck ROM: Full    Dental  (+) Teeth Intact   Pulmonary    breath sounds clear to auscultation       Cardiovascular hypertension, Pt. on medications  Rhythm:Regular Rate:Normal     Neuro/Psych negative neurological ROS  negative psych ROS   GI/Hepatic negative GI ROS, Neg liver ROS,   Endo/Other  diabetes, Insulin Dependent  Renal/GU negative Renal ROS  negative genitourinary   Musculoskeletal  (+) Arthritis ,   Abdominal   Peds negative pediatric ROS (+)  Hematology negative hematology ROS (+)   Anesthesia Other Findings   Reproductive/Obstetrics                            Lab Results  Component Value Date   WBC 6.3 09/29/2015   HGB 14.1 09/29/2015   HCT 42.9 09/29/2015   MCV 80.8 09/29/2015   PLT 140* 09/29/2015   Lab Results  Component Value Date   CREATININE 1.07 09/29/2015   BUN 13 09/29/2015   NA 140 09/29/2015   K 4.1 09/29/2015   CL 108 09/29/2015   CO2 25 09/29/2015   No results found for: INR, PROTIME  09/2015 EKG: normal sinus rhythm.  Echo 09/2015  - Left ventricle: The cavity size was normal. Wall thickness was increased in a pattern of moderate LVH. Systolic function was normal. The estimated ejection fraction was in the range of 60% to 65%. Although no diagnostic regional wall motion abnormality was identified, this possibility cannot be completely excluded on the basis of this study. Features are consistent with a pseudonormal left ventricular filling pattern, with concomitant abnormal relaxation and increased filling pressure (grade 2 diastolic dysfunction). - Aortic valve: Mildly calcified annulus. Trileaflet; mildly calcified  leaflets. - Aortic root: The aortic root was mildly dilated. - Mitral valve: There was trivial regurgitation. - Left atrium: The atrium was at the upper limits of normal in size. - Right atrium: Central venous pressure (est): 3 mm Hg. - Atrial septum: No defect or patent foramen ovale was identified. - Tricuspid valve: There was physiologic regurgitation. - Pulmonary arteries: Systolic pressure could not be accurately estimated. - Pericardium, extracardiac: There was no pericardial effusion.    Anesthesia Physical Anesthesia Plan  ASA: III  Anesthesia Plan: General   Post-op Pain Management:    Induction: Intravenous  Airway Management Planned: Oral ETT  Additional Equipment:   Intra-op Plan:   Post-operative Plan: Extubation in OR  Informed Consent: I have reviewed the patients History and Physical, chart, labs and discussed the procedure including the risks, benefits and alternatives for the proposed anesthesia with the patient or authorized representative who has indicated his/her understanding and acceptance.   Dental advisory given  Plan Discussed with: CRNA  Anesthesia Plan Comments:         Anesthesia Quick Evaluation

## 2015-10-07 NOTE — Anesthesia Postprocedure Evaluation (Signed)
Anesthesia Post Note  Patient: Vincent Buckert [place] Sr.  Procedure(s) Performed: Procedure(s) (LRB): Right Lumbar four-five ,lumbar five sacral-one Laminectomy (Right)  Patient location during evaluation: PACU Anesthesia Type: General Level of consciousness: awake and alert Pain management: pain level controlled Vital Signs Assessment: post-procedure vital signs reviewed and stable Respiratory status: spontaneous breathing, nonlabored ventilation, respiratory function stable and patient connected to nasal cannula oxygen Cardiovascular status: blood pressure returned to baseline and stable Postop Assessment: no signs of nausea or vomiting Anesthetic complications: no    Last Vitals:  Filed Vitals:   10/07/15 0702 10/07/15 1110  BP: 181/80 203/87  Pulse: 86 94  Temp: 36.9 C 36.5 C  Resp: 18 25    Last Pain:  Filed Vitals:   10/07/15 1116  PainSc: [place] Hollis

## 2015-10-07 NOTE — Op Note (Signed)
Vincent Ramos, Vincent Ramos              ACCOUNT NO.:  [id number]  MEDICAL RECORD NO.:  [id number]  LOCATION:  [facility]                         FACILITY:  [facility]  PHYSICIAN:  Leeroy Cha, M.D.   DATE OF BIRTH:  [birth year]  DATE OF PROCEDURE:  10/07/2015 DATE OF DISCHARGE:                              OPERATIVE REPORT   PREOPERATIVE DIAGNOSES:  Lumbar stenosis with right-sided radiculopathy of L4-5 and L5-S1.  Morbid obesity.  Diabetes.  POSTOPERATIVE DIAGNOSES:  Lumbar stenosis with right-sided radiculopathy of L4-5 and L5-S1.  Morbid obesity.  Diabetes.  PROCEDURES:  Right L4-L5 hemilaminectomy, foraminotomy, decompression of the L4, L5, S1 nerve root.  Lysis of adhesion.  Microscope.  SURGEON:  Leeroy Cha, M.D.  ASSISTANT:  Hosie Spangle, M.D.  CLINICAL HISTORY:  The patient was seen in my office with a complaint of many years of back pain radiation to the right leg.  He denies any pain in the left leg.  He has failed with conservative treatment.  Myelogram showed that he has severe stenosis at the level of L4-5 and L5-S1.  The patient had no symptoms whatsoever in the left side.  We decided with surgery in the right side.  He knew the risks and benefits.  DESCRIPTION OF PROCEDURE:  The patient was taken to the OR, and after intubation, he was positioned in a prone manner.  Because of the size, we were unable to feel any bony structure.  The skin was cleaned with Betadine and later with DuraPrep.  X-rays showed that we were right at the level of tip of L4.  Then, incision in the midline was made from L4 all the way down to L5-S1 with retraction to the right side.  The patient had quite a bit of thick adipose tissue.  X-rays were obtained again.  We started with drilling the hemilamina of L4 and L5.  The patient has quite a bit of calcified ligament and to be able to decompress the thecal sac.  We have to use the 1-mm Kerrison punch.  We worked our way laterally.  We  identified the L4, L5 and S1 nerve roots. The L5 was more compromised than all of them.  Using the drill as well as the 1, 2, and 3-mm Kerrison punch, we were able to do foraminotomy to decompress the L4, L5 and S1.  The area was irrigated.  He has also some venous bleeding, which was taken care by bipolar.  Irrigation was done. Vancomycin was left in the operative site.  Drain also was left.  Then, the wound was closed with several layers of Vicryl and staple.         ______________________________ Leeroy Cha, M.D.    EB/MEDQ  D:  10/07/2015  T:  10/07/2015  Job:  [id number]

## 2015-10-08 ENCOUNTER — Encounter (HOSPITAL_COMMUNITY): Payer: Self-pay | Admitting: Neurosurgery

## 2015-10-08 LAB — GLUCOSE, CAPILLARY
GLUCOSE-CAPILLARY: 223 mg/dL — AB (ref 65–99)
GLUCOSE-CAPILLARY: 193 mg/dL — AB (ref 65–99)
Glucose-Capillary: 150 mg/dL — ABNORMAL HIGH (ref 65–99)
Glucose-Capillary: 227 mg/dL — ABNORMAL HIGH (ref 65–99)
Glucose-Capillary: 293 mg/dL — ABNORMAL HIGH (ref 65–99)

## 2015-10-08 NOTE — Evaluation (Signed)
Occupational Therapy Evaluation Patient Details Name: Vincent Gianino Dawe Sr. MRN: [id number] DOB: 04-Jul-1951 Today's Date: 10/08/2015    History of Present Illness 65 y.o. male s/p Right Lumbar four-five ,lumbar five sacral-one Laminectomy. Hx of DM and HTN   Clinical Impression   Pt admitted as above currently demonstrating deficits in his ability to perform ADL's and functional transfers (see OT problem list below). He should benefit from acute OT to assist in maximizing independence with ADL's in anticipation for discharge home with family/PRN assistance.     Follow Up Recommendations  No OT follow up;Supervision - Intermittent    Equipment Recommendations  3 in 1 bedside comode (Wide 3:1, possible AE)    Recommendations for Other Services       Precautions / Restrictions Precautions Precautions: Back Precaution Booklet Issued:  (Pt has in room, previously issued. Reviewed with OT today) Precaution Comments: Verbally reviewed back precautions for comfort. No precautions ordered. Required Braces or Orthoses:  ("No brace needed" in order set, also given in report to RN) Restrictions Weight Bearing Restrictions: No      Mobility Bed Mobility Overal bed mobility:  (Pt up amb w/ PT upon OT arrival) Bed Mobility: Rolling;Sidelying to Sit;Sit to Sidelying Rolling: Min guard Sidelying to sit: Min guard     Sit to sidelying: Min guard General bed mobility comments: Pt required VC to perform safely. Quite effortful but able to physically perform without PT assist. No use of rail today.  Transfers Overall transfer level: Needs assistance Equipment used: Rolling walker (2 wheeled) Transfers: Sit to/from Stand Sit to Stand: Min guard         General transfer comment: Close guard for safety, moves slowly. Requires frequent rest breaks, VC for hand placement and follow through with back precautions while transferring from standing to chair.     Balance Overall balance  assessment: Needs assistance Sitting-balance support: Feet supported Sitting balance-Leahy Scale: Good     Standing balance support: Bilateral upper extremity supported Standing balance-Leahy Scale: Fair                              ADL Overall ADL's : Needs assistance/impaired Eating/Feeding: Independent;Sitting   Grooming: Wash/dry hands;Wash/dry face;Independent;Sitting   Upper Body Bathing: Set up;Sitting   Lower Body Bathing: Sit to/from stand;Moderate assistance;Cueing for back precautions   Upper Body Dressing : Sitting;Supervision/safety;Set up   Lower Body Dressing: Moderate assistance;Sit to/from stand;Cueing for back precautions   Toilet Transfer: Min guard;Ambulation;RW;BSC (Simulated transfer amb in hallway and transferred to chair) Toilet Transfer Details (indicate cue type and reason): Wide 3:1 Toileting- Clothing Manipulation and Hygiene: Cueing for back precautions;Sit to/from stand;Supervision/safety;Min guard       Functional mobility during ADLs: Minimal assistance;Rolling walker;Cueing for safety (Moves slowly, monitor O2 SATS) General ADL Comments: Pt was educated in role of OT and AE reviewed verbally as well as back precautions and home set-up recommendations. He currently demonstrates deficits in his ability to perform ADL's and functional transfers. He should benefit from acute OT to assist in maximizing independence with ADL's prior to anticipated d/c home with PRN family assist.     Vision  Wears Readers only. No change from baseline   Perception     Praxis      Pertinent Vitals/Pain Pain Assessment: Faces Pain Score: 5  Faces Pain Scale: Hurts even more Pain Location: Back Pain Descriptors / Indicators: Aching Pain Intervention(s): Limited activity within patient's tolerance;Monitored during session;Other (  comment) (Pt using PCA pump)     Hand Dominance Right   Extremity/Trunk Assessment Upper Extremity Assessment Upper  Extremity Assessment: Overall WFL for tasks assessed   Lower Extremity Assessment Lower Extremity Assessment: Defer to PT evaluation       Communication Communication Communication: No difficulties   Cognition Arousal/Alertness: Awake/alert Behavior During Therapy: WFL for tasks assessed/performed Overall Cognitive Status: Within Functional Limits for tasks assessed                     General Comments       Exercises       Shoulder Instructions      Home Living Family/patient expects to be discharged to:: Private residence Living Arrangements: Spouse/significant other Available Help at Discharge: Family;Available 24 hours/day (Avail 24/7 for a few days) Type of Home: House Home Access: Stairs to enter [employer] of Steps: 4 Entrance Stairs-Rails: Right;Left (Split) Home Layout: One level     Bathroom Shower/Tub: Teacher, early years/pre: Standard     Home Equipment: Cane - single point (Potentially has RW and 3:1 )          Prior Functioning/Environment Level of Independence: Independent with assistive device(s)        Comments: using cane for mobility    OT Diagnosis: Acute pain   OT Problem List: Decreased activity tolerance;Decreased knowledge of precautions;Decreased knowledge of use of DME or AE;Pain   OT Treatment/Interventions: Self-care/ADL training;DME and/or AE instruction;Patient/family education;Therapeutic activities    OT Goals(Current goals can be found in the care plan section) Acute Rehab OT Goals Patient Stated Goal: None stated Time For Goal Achievement: 10/22/15 Potential to Achieve Goals: Good ADL Goals Pt Will Perform Lower Body Bathing: with adaptive equipment;sit to/from stand;with min guard assist;with caregiver independent in assisting Pt Will Perform Lower Body Dressing: with min guard assist;with adaptive equipment;sit to/from stand;with caregiver independent in assisting Pt Will Transfer to  Toilet: with modified independence;ambulating;bedside commode Pt Will Perform Toileting - Clothing Manipulation and hygiene: with modified independence;sit to/from stand Pt Will Perform Tub/Shower Transfer: Tub transfer;with caregiver independent in assisting;with min guard assist;ambulating;rolling walker Additional ADL Goal #1: Pt will state 3/3 back precautions and implement during OT session independently.  OT Frequency: Min 2X/week   Barriers to D/C:            Co-evaluation              End of Session Equipment Utilized During Treatment: Gait belt;Rolling walker Nurse Communication: Mobility status;Other (comment) (O2 SATS decreased with ambulation w/ PT to 85%, recovered to 92% on RA after educated in breathing techniques)  Activity Tolerance: Patient tolerated treatment well Patient left: in chair;with call bell/phone within reach   Time: 0907-0918 OT Time Calculation (min): 11 min Charges:  OT General Charges $OT Visit: 1 Procedure OT Evaluation $OT Eval Moderate Complexity: 1 Procedure G-Codes:    Almyra Deforest, OTR/L 10/08/2015, 9:40 AM

## 2015-10-08 NOTE — Care Management Note (Signed)
Case Management Note  Patient Details  Name: Vincent Latter Ament Sr. MRN: [id number] Date of Birth: 03/17/1951  Subjective/Objective:      Patient underwent lumbar surgery. He is from home with his wife.               Action/Plan: PT recommending no f/u. CM will continue to follow for discharge needs.   Expected Discharge Date:                  Expected Discharge Plan:     In-House Referral:     Discharge planning Services     Post Acute Care Choice:    Choice offered to:     DME Arranged:    DME Agency:     HH Arranged:    HH Agency:     Status of Service:  In process, will continue to follow  Medicare Important Message Given:    Date Medicare IM Given:    Medicare IM give by:    Date Additional Medicare IM Given:    Additional Medicare Important Message give by:     If discussed at [place] of Stay Meetings, dates discussed:    Additional Comments:  Pollie Friar, RN 10/08/2015, 10:39 AM

## 2015-10-08 NOTE — Progress Notes (Signed)
Physical Therapy Treatment Patient Details Name: Vincent Lassiter Schoene Sr. MRN: [id number] DOB: [birth year] Today's Date: 10/08/2015    History of Present Illness 65 y.o. male s/p Right Lumbar four-five ,lumbar five sacral-one Laminectomy. Hx of DM and HTN    PT Comments    Eager to work with therapy. All mobility is quite effortful for patient but he is capable of performing basic tasks without physical assist, requiring close guard for safety. SpO2 did drop with gait to 85%, HR to 120s, but quickly resolved with seated rest break back to 90% range. Anticipate a bit of a slower recovery, will work with pt aggressively to ensure safety and independence with mobility as tolerated.  Follow Up Recommendations  No PT follow up (OPPT when cleared by MD)     Equipment Recommendations  Rolling walker with 5" wheels (bariatric due to body habitus)    Recommendations for Other Services OT consult     Precautions / Restrictions Precautions Precautions: Back Precaution Booklet Issued:  (Pt has in room, previously issued. Reviewed with OT today) Precaution Comments: Verbally reviewed back precautions for comfort. No precautions ordered. Required Braces or Orthoses:  ("No brace needed" in order set, also given in report to RN) Restrictions Weight Bearing Restrictions: No    Mobility  Bed Mobility Overal bed mobility: Needs Assistance Bed Mobility: Rolling;Sidelying to Sit;Sit to Sidelying Rolling: Min guard Sidelying to sit: Min guard     Sit to sidelying: Min guard General bed mobility comments: Pt required VC to perform safely. Quite effortful but able to physically perform without PT assist. No use of rail today.  Transfers Overall transfer level: Needs assistance Equipment used: Rolling walker (2 wheeled) Transfers: Sit to/from Stand Sit to Stand: Min guard         General transfer comment: Close guard for safety. Required 2 attempts. VC for hand placement. Slow and effortful rise  from bed.  Ambulation/Gait Ambulation/Gait assistance: Min guard Ambulation Distance (Feet): 60 Feet Assistive device: Rolling walker (2 wheeled) Gait Pattern/deviations: Step-through pattern;Decreased stance time - right;Antalgic;Trunk flexed;Wide base of support Gait velocity: decreased Gait velocity interpretation: Below normal speed for age/gender General Gait Details: Mildly flexed and guarded. More antalgic today with slow start. SpO2 dropped to 85% briefly while ambulating, improved to mid 90s after sitting with pursed lip breathing and 2L supplemental O2 applied. Mild instability of RLE but good support from UEs. Cues for safety and technique throughout.   Stairs            Wheelchair Mobility    Modified Rankin (Stroke Patients Only)       Balance                                    Cognition Arousal/Alertness: Awake/alert Behavior During Therapy: WFL for tasks assessed/performed Overall Cognitive Status: Within Functional Limits for tasks assessed                      Exercises      General Comments        Pertinent Vitals/Pain Pain Assessment: Faces Pain Score: 5  Faces Pain Scale: Hurts even more Pain Location: Back Pain Descriptors / Indicators: Aching Pain Intervention(s): Limited activity within patient's tolerance;Monitored during session;Other (comment) (Pt using PCA pump)    Home Living Family/patient expects to be discharged to:: Private residence Living Arrangements: Spouse/significant other Available Help at Discharge: Family;Available 24 hours/day (Avail  24/7 for a few days) Type of Home: House Home Access: Stairs to enter Entrance Stairs-Rails: Right;Left (Split) Home Layout: One level Home Equipment: Cane - single point (Potentially has RW and 3:1 )      Prior Function Level of Independence: Independent with assistive device(s)      Comments: using cane for mobility   PT Goals (current goals can now be  found in the care plan section) Acute Rehab PT Goals Patient Stated Goal: None stated PT Goal Formulation: With patient Time For Goal Achievement: 10/21/15 Potential to Achieve Goals: Good Progress towards PT goals: Progressing toward goals    Frequency  Min 5X/week    PT Plan Current plan remains appropriate    Co-evaluation             End of Session Equipment Utilized During Treatment: Gait belt Activity Tolerance: Patient tolerated treatment well Patient left: with call bell/phone within reach;in chair (with OT)     [id number] PT Time Calculation (min) (ACUTE ONLY): 23 min  Charges:  $Gait Training: 8-22 mins $Therapeutic Activity: 8-22 mins                    G Codes:      Ellouise Newer 2015/10/09, 9:22 AM  Elayne Snare, [place]

## 2015-10-08 NOTE — Progress Notes (Signed)
Patient ID: Vincent Coburn Warth Sr., male   DOB: 1950-08-04, 65 y.o.   MRN: [id number] wiil continue with pca morphine for at least 24 more hours. Poor ambulation. To get rehab to see

## 2015-10-08 NOTE — Progress Notes (Signed)
Patient ID: Vincent Sandford Aleman Sr., male   DOB: 1951/01/09, 65 y.o.   MRN: [id number] Better, still incisional pain. No weakness

## 2015-10-09 LAB — GLUCOSE, CAPILLARY
GLUCOSE-CAPILLARY: 265 mg/dL — AB (ref 65–99)
Glucose-Capillary: 248 mg/dL — ABNORMAL HIGH (ref 65–99)
Glucose-Capillary: 233 mg/dL — ABNORMAL HIGH (ref 65–99)
Glucose-Capillary: 253 mg/dL — ABNORMAL HIGH (ref 65–99)

## 2015-10-09 MED ORDER — FLEET ENEMA 7-19 GM/118ML RE ENEM
1.0000 | ENEMA | Freq: Every day | RECTAL | Status: DC | PRN
  Filled 2015-10-09 (×2): qty 1

## 2015-10-09 NOTE — Progress Notes (Addendum)
Inpatient Diabetes Program Recommendations  AACE/ADA: New Consensus Statement on Inpatient Glycemic Control (2015)  Target Ranges:  Prepandial:   less than 140 mg/dL      Peak postprandial:   less than 180 mg/dL (1-2 hours)      Critically ill patients:  140 - 180 mg/dL  Results for Recinos, ADAMO MERRITHEW SR. (MRN [id number]) as of 10/09/2015 10:40  Ref. Range 10/08/2015 06:26 10/08/2015 12:01 10/08/2015 16:56 10/08/2015 21:34 10/09/2015 06:22  Glucose-Capillary Latest Ref Range: 65-99 mg/dL 227 (H) 223 (H) 293 (H) 193 (H) 248 (H)   Review of Glycemic Control  Diabetes history: DM2 Outpatient Diabetes medications: Lantus 30 units QHS, Januvia 100 mg daily Current orders for Inpatient glycemic control: Novolog 0-20 units TID with meals, Novlog 0-5 units QHS, Tradjenta 5 mg daily  Inpatient Diabetes Program Recommendations: Insulin - Basal: Glucose has ranged from 193-293 mg/dl over the past 24 hours. Please ordering Lantus 15 units daily (starting now).  Thanks, Barnie Alderman, RN, MSN, CDE Diabetes Coordinator Inpatient Diabetes Program [phone] (Team Pager from [place] to [place]) [phone] ([place] office) [phone] [facility] office) [phone] [facility] [place] office)

## 2015-10-09 NOTE — Progress Notes (Signed)
Patient ID: Vincent Troost Kutner Sr., male   DOB: Jan 06, 1951, 65 y.o.   MRN: [id number] Still having incisional pain, no weakness. Distended Flatus positive

## 2015-10-09 NOTE — Progress Notes (Signed)
Occupational Therapy Treatment Patient Details Name: Vincent Apodaca Elvin Sr. MRN: [id number] DOB: January 18, 1951 Today's Date: 10/09/2015    History of present illness 65 y.o. male s/p Right Lumbar four-five ,lumbar five sacral-one Laminectomy. Hx of DM and HTN   OT comments  Pt making gradual progress toward OT goals this session; limited by lethargy (medication related?). Educated pt on AE for increased independence with LB ADLs and tub transfer technique; pt min assist with use of AE and tub transfer at this time. Pt able to recall 0/3 back precautions; educated on all precautions for safety and comfort. D/c plan remains appropriate. Will continue to follow acutely.   Follow Up Recommendations  No OT follow up;Supervision - Intermittent    Equipment Recommendations  3 in 1 bedside comode (wide 3 in 1, AE)    Recommendations for Other Services      Precautions / Restrictions Precautions Precautions: Back Precaution Comments: Pt able to recall 0/3 precautions at start of session. Reviewed all precautions with pt. Restrictions Weight Bearing Restrictions: No       Mobility Bed Mobility               General bed mobility comments: Pt OOB in chair upon arrival.  Transfers Overall transfer level: Needs assistance Equipment used: Rolling walker (2 wheeled) Transfers: Sit to/from Stand Sit to Stand: Min guard         General transfer comment: Hands on min guard for safety. Increased time required and VCs for hand placement and technique.    Balance Overall balance assessment: Needs assistance Sitting-balance support: Feet supported;No upper extremity supported Sitting balance-Leahy Scale: Fair     Standing balance support: Bilateral upper extremity supported;During functional activity Standing balance-Leahy Scale: Poor Standing balance comment: RW for support                   ADL Overall ADL's : Needs assistance/impaired               Lower Body Bathing  Details (indicate cue type and reason): Educated pt on use of long handled sponge; pt verbalized understanding.     Lower Body Dressing: Minimal assistance;With adaptive equipment;Sit to/from stand Lower Body Dressing Details (indicate cue type and reason): Educated on use of long handled shoe horn, reacher, and sock aide. Pt able to return demo use of reacher and sock aide for doffing/donning socks.       Toileting - Clothing Manipulation Details (indicate cue type and reason): Educated pt on use of toilet aide for increased independence with peri care; pt verbalized understanding. Tub/ Shower Transfer: Tub transfer;Minimal assistance;Ambulation;Rolling walker Tub/Shower Transfer Details (indicate cue type and reason): Educated pt on tub transfer technique. Pt able to demo static high marching but did not feel safe practicing simulated tub transfer with stepping backward at this time. Pt reports he can sponge bathe until he feels comfortable getting in and out of tub. Educated on need for supervision with tub transfers when ready.  Functional mobility during ADLs: Minimal assistance;Rolling walker;Cueing for safety General ADL Comments: No family present for OT session. Pt slightly lethargic this session (medication?) and reports increased pain with movement.       Vision                     Perception     Praxis      Cognition   Behavior During Therapy: [facility] for tasks assessed/performed Overall Cognitive Status: Within Functional Limits for tasks assessed  Memory: Decreased recall of precautions               Extremity/Trunk Assessment               Exercises     Shoulder Instructions       General Comments      Pertinent Vitals/ Pain       Pain Assessment: 0-10 Pain Score: 8  Pain Location: back Pain Descriptors / Indicators: Aching;Grimacing Pain Intervention(s): Limited activity within patient's tolerance;Monitored during session;Patient  requesting pain meds-RN notified;PCA encouraged  Home Living                                          Prior Functioning/Environment              Frequency Min 2X/week     Progress Toward Goals  OT Goals(current goals can now be found in the care plan section)  Progress towards OT goals: Progressing toward goals  Acute Rehab OT Goals Patient Stated Goal: None stated  Plan Discharge plan remains appropriate    Co-evaluation                 End of Session Equipment Utilized During Treatment: Gait belt;Rolling walker   Activity Tolerance Patient limited by lethargy   Patient Left in chair;with call bell/phone within reach   Nurse Communication Patient requests pain meds        Time: [id number] OT Time Calculation (min): 23 min  Charges: OT General Charges $OT Visit: 1 Procedure OT Treatments $Self Care/Home Management : 23-37 mins  Binnie Kand M.S., OTR/L Pager: [phone]  10/09/2015, 9:15 AM

## 2015-10-09 NOTE — Progress Notes (Signed)
Physical medicine rehabilitation consult requested chart reviewed. Patient status post lumbar laminectomy. Physical therapy occupational therapy evaluations completed and notes no physical therapy follow-up advised outpatient PT if needed. Patient at mid assist level for initial evaluation. At this time patient will not need inpatient rehabilitation services. Recommendations are discharge to home

## 2015-10-09 NOTE — Progress Notes (Signed)
Physical Therapy Treatment Patient Details Name: Vincent Burkette Marinello Sr. MRN: [id number] DOB: 09/17/1950 Today's Date: 10/09/2015    History of Present Illness 65 y.o. male s/p Right Lumbar four-five ,lumbar five sacral-one Laminectomy. Hx of DM and HTN    PT Comments    Pt performed decreased mobility secondary to fatigue from ambulating to and from bathroom with RN.  Pt required increased education to recall and maintain back precautions.  Pt educated on importance of use of incentive spirometer to improve respiratory function.  Pt will require gait and stair training next visit if able.    Follow Up Recommendations  No PT follow up (OPPT when cleared by MD.  )     Equipment Recommendations  Rolling walker with 5" wheels (bariatric. )    Recommendations for Other Services       Precautions / Restrictions Precautions Precautions: Back Precaution Comments: Pt unable to recall back precautions.  pt re-edcuated to improve safety with mobility.   Restrictions Weight Bearing Restrictions: No    Mobility  Bed Mobility Overal bed mobility: Needs Assistance Bed Mobility: Rolling;Sit to Sidelying Rolling: Min guard       Sit to sidelying: Min assist General bed mobility comments: Pt required increased assistance to lift B LEs into bed from seated position.    Transfers Overall transfer level: Needs assistance Equipment used: Rolling walker (2 wheeled) Transfers: Sit to/from Stand Sit to Stand: Min guard         General transfer comment: Cues for hand placement.  pt demonstrated poor eccentric laoading.  Pt required cues for safety.    Ambulation/Gait Ambulation/Gait assistance: Min guard Ambulation Distance (Feet): 4 Feet (steps to [facility] to position for back to bed transfer.  Pt just ambulated with RN on PTA arrival, therfore pt refused to ambulate with PTA.  ) Assistive device: Rolling walker (2 wheeled) Gait Pattern/deviations: Trunk flexed;Antalgic     General Gait  Details: unsteady remains mildly flexed and lethargic on arrival.  Pt performed sidestepping to position to head of bed with slow buckling knees during side stepping.  Pt remains to require further gait training with PTA and stair training before d/c home.     Stairs            Wheelchair Mobility    Modified Rankin (Stroke Patients Only)       Balance                                    Cognition Arousal/Alertness: Lethargic Behavior During Therapy: WFL for tasks assessed/performed Overall Cognitive Status: Within Functional Limits for tasks assessed       Memory: Decreased recall of precautions              Exercises Other Exercises Other Exercises: Instructed pt in correct use of incentive spirometer to improve respiratory function.  Pt performed 1x10 reps at 711ml-1250 ml.  Pt required cues to slow technique to improve outcome.  Pt educated to perform 1x10 every hour awake.      General Comments        Pertinent Vitals/Pain Pain Assessment: Faces Faces Pain Scale: Hurts even more Pain Location: BACK Pain Descriptors / Indicators: Grimacing;Guarding Pain Intervention(s): Limited activity within patient's tolerance;Monitored during session;Repositioned    Home Living                      Prior Function  PT Goals (current goals can now be found in the care plan section) Acute Rehab PT Goals Patient Stated Goal: None stated Potential to Achieve Goals: Good Progress towards PT goals: Progressing toward goals    Frequency  Min 5X/week    PT Plan Current plan remains appropriate    Co-evaluation             End of Session Equipment Utilized During Treatment: Gait belt Activity Tolerance: Patient tolerated treatment well Patient left: with call bell/phone within reach;in bed     Time: 1540-1600 PT Time Calculation (min) (ACUTE ONLY): 20 min  Charges:  $Therapeutic Activity: 8-22 mins                     G Codes:      Cristela Blue Oct 30, 2015, 4:14 PM  Governor Rook[place], PTA pager [phone]

## 2015-10-09 NOTE — Care Management Important Message (Signed)
Important Message  Patient Details  Name: Vincent Winkelmann Shave Sr. MRN: [id number] Date of Birth: Sep 01, 1950   Medicare Important Message Given:  Yes    [name] P Isella Slatten 10/09/2015, 1:00 PM

## 2015-10-10 LAB — GLUCOSE, CAPILLARY
GLUCOSE-CAPILLARY: 249 mg/dL — AB (ref 65–99)
GLUCOSE-CAPILLARY: 244 mg/dL — AB (ref 65–99)
Glucose-Capillary: 223 mg/dL — ABNORMAL HIGH (ref 65–99)

## 2015-10-10 MED ORDER — HYDROMORPHONE HCL 1 MG/ML IJ SOLN
1.0000 mg | INTRAMUSCULAR | Status: DC | PRN
  Administered 2015-10-10 – 2015-10-11 (×3): 1 mg via INTRAVENOUS
  Filled 2015-10-10 (×3): qty 1

## 2015-10-10 NOTE — Progress Notes (Signed)
Patient ID: Vincent Bicknell Falconi Sr., male   DOB: 03/28/1951, 65 y.o.   MRN: [id number] Patient doing okay with condition of back pain legs seem to be improving  Strength out of 5 wound appears to be clean dry and intact  We will DC his PCA will also DC his Hemovac mobilize with physical occupational therapy.

## 2015-10-10 NOTE — Progress Notes (Addendum)
Physical Therapy Treatment Patient Details Name: Vincent Sanches Borras Sr. MRN: [id number] DOB: 1951/01/02 Today's Date: 10/10/2015    History of Present Illness 65 y.o. male s/p Right Lumbar four-five ,lumbar five sacral-one Laminectomy. Hx of DM and HTN    PT Comments    Progressing towards functional goals. Better tolerance with gait today up to 160 feet, requiring several rest breaks. SpO2 86% on room air at rest, 88-90% on 2L with gait, RN notified. Able to navigate steps safely. Will continue to follow and progress pt as tolerated. May benefit from HHPT initially due to slow progress but anticipate accelerated recovery now that pt has better ambulatory tolerance.  Follow Up Recommendations  Home health PT;Supervision for mobility/OOB     Equipment Recommendations  Rolling walker with 5" wheels (bariatric)    Recommendations for Other Services OT consult     Precautions / Restrictions Precautions Precautions: Back Precaution Comments: Reviewed back precautions. Restrictions Weight Bearing Restrictions: No    Mobility  Bed Mobility Overal bed mobility: Needs Assistance Bed Mobility: Rolling;Sidelying to Sit Rolling: Supervision Sidelying to sit: Supervision       General bed mobility comments: Supervision for safety. Cues for technique with log roll. Used rail moderately for support. Difficult due to body habitus.  Transfers Overall transfer level: Needs assistance Equipment used: Rolling walker (2 wheeled) Transfers: Sit to/from Stand Sit to Stand: Supervision         General transfer comment: Better power up to stand. Practiced from bed and recliner today with supervision for safety. Moderately guarded but uses RW appropriately upon standing for support.  Ambulation/Gait Ambulation/Gait assistance: Min guard Ambulation Distance (Feet): 160 Feet Assistive device: Rolling walker (2 wheeled) Gait Pattern/deviations: Step-through pattern;Decreased stride length;Wide  base of support Gait velocity: decreased Gait velocity interpretation: Below normal speed for age/gender General Gait Details: Required several standing rest breaks to complete due to RLE pain but greatly improved gait today. Cues for pursed lip breathing. Required 2L supplemental O2 to maintain 88-90% sats. No buckling noted. Guarded and antalgic but steady with RW for support.   Stairs Stairs: Yes Stairs assistance: Supervision Stair Management: Two rails;Step to pattern;Forwards;Alternating pattern Number of Stairs: 6 General stair comments: VC for sequencing, instructions for step-to pattern but tends to alternate occasionally. No buckling noted. Required extra time and supervision for safety but able to complete safely. States he feels confident with this task. Dyspneic.  Wheelchair Mobility    Modified Rankin (Stroke Patients Only)       Balance                                    Cognition Arousal/Alertness: Lethargic;Suspect due to medications Behavior During Therapy: [facility] for tasks assessed/performed Overall Cognitive Status: Within Functional Limits for tasks assessed                      Exercises      General Comments General comments (skin integrity, edema, etc.): SpO2 85% on 1L with gait, 86% on room air at rest, 96% on 2L at rest, 88-90% on 2L with gait. HR in 110s.      Pertinent Vitals/Pain Pain Assessment: Faces Faces Pain Scale: Hurts a little bit Pain Location: back,RLE Pain Descriptors / Indicators: Aching;Shooting;Tightness Pain Intervention(s): Monitored during session;Repositioned;Premedicated before session    Home Living  Prior Function            PT Goals (current goals can now be found in the care plan section) Acute Rehab PT Goals Patient Stated Goal: None stated PT Goal Formulation: With patient Time For Goal Achievement: 10/21/15 Potential to Achieve Goals: Good Progress towards PT  goals: Progressing toward goals    Frequency  Min 5X/week    PT Plan Discharge plan needs to be updated    Co-evaluation             End of Session Equipment Utilized During Treatment: Gait belt Activity Tolerance: Patient tolerated treatment well Patient left: in chair;with call bell/phone within reach;with chair alarm set     Time: [id number] PT Time Calculation (min) (ACUTE ONLY): 29 min  Charges:  $Gait Training: 8-22 mins $Therapeutic Activity: 8-22 mins                    G Codes:      Ellouise Newer 11-05-2015, 1:43 PM  Camille Bal [place], [place]

## 2015-10-11 LAB — GLUCOSE, CAPILLARY
GLUCOSE-CAPILLARY: 234 mg/dL — AB (ref 65–99)
GLUCOSE-CAPILLARY: 237 mg/dL — AB (ref 65–99)
GLUCOSE-CAPILLARY: 242 mg/dL — AB (ref 65–99)
GLUCOSE-CAPILLARY: 279 mg/dL — AB (ref 65–99)
Glucose-Capillary: 249 mg/dL — ABNORMAL HIGH (ref 65–99)

## 2015-10-11 NOTE — Progress Notes (Signed)
Physical Therapy Treatment Patient Details Name: Vincent Rancourt Weyers Sr. MRN: [id number] DOB: [birth year] Today's Date: 10/11/2015    History of Present Illness 65 y.o. male s/p Right Lumbar four-five ,lumbar five sacral-one Laminectomy. Hx of DM and HTN    PT Comments    Good progress today, ambulating 150 feet, mild tightness reported in right hip. States he feels better this evening. SpO2 did drop to 85% on room air with mild dyspnea, up to 88% with pursed lip breathing. SpO2 is 93% on room air at rest. Will continue to follow until d/c. Has completed stair training and is eager to return home.  Follow Up Recommendations  Supervision for mobility/OOB;No PT follow up     Equipment Recommendations  Rolling walker with 5" wheels (bariatric)    Recommendations for Other Services OT consult     Precautions / Restrictions Precautions Precautions: Back Precaution Comments: Reviewed back precautions. Restrictions Weight Bearing Restrictions: No    Mobility  Bed Mobility               General bed mobility comments: in recliner  Transfers Overall transfer level: Needs assistance Equipment used: Rolling walker (2 wheeled) Transfers: Sit to/from Stand Sit to Stand: Supervision         General transfer comment: Good power up to stand. No physical assist needed. Performed from bed with RW for support upon standing. VC for hand placement.  Ambulation/Gait Ambulation/Gait assistance: Supervision Ambulation Distance (Feet): 150 Feet Assistive device: Rolling walker (2 wheeled) Gait Pattern/deviations: Step-through pattern;Decreased stride length;Antalgic Gait velocity: decreased Gait velocity interpretation: Below normal speed for age/gender General Gait Details: Better mechanics and posture today with intermittent cues. No buckling noted. Required several standing restbreaks due to "Right hip tightness."  Mild dyspnea with SpO2 downt o 85% today on room air, up to 88 with  standing rest and cues for pursed lip breathing.   Stairs            Wheelchair Mobility    Modified Rankin (Stroke Patients Only)       Balance                                    Cognition Arousal/Alertness: Awake/alert Behavior During Therapy: WFL for tasks assessed/performed Overall Cognitive Status: Within Functional Limits for tasks assessed                      Exercises Other Exercises Other Exercises: Reviewed use of incentive inspirometer.    General Comments General comments (skin integrity, edema, etc.): Resting sats 93% on room air, down to 85% with gait on room air, back to 90s with 2L and rest.      Pertinent Vitals/Pain Pain Assessment: 0-10 Pain Score: 3  Pain Location: back RLE Pain Descriptors / Indicators: Tightness Pain Intervention(s): Monitored during session;Repositioned    Home Living                      Prior Function            PT Goals (current goals can now be found in the care plan section) Acute Rehab PT Goals Patient Stated Goal: None stated PT Goal Formulation: With patient Time For Goal Achievement: 10/21/15 Potential to Achieve Goals: Good Progress towards PT goals: Progressing toward goals    Frequency  Min 5X/week    PT Plan Discharge plan needs to be updated  Co-evaluation             End of Session Equipment Utilized During Treatment: Gait belt Activity Tolerance: Patient tolerated treatment well Patient left: with call bell/phone within reach;in bed;with family/visitor present (EOB eating.)     Time: 1721-1740 PT Time Calculation (min) (ACUTE ONLY): 19 min  Charges:  $Gait Training: 8-22 mins                    G Codes:      Ellouise Newer Oct 31, 2015, 6:40 PM  Camille Bal [place], [place]

## 2015-10-11 NOTE — Progress Notes (Signed)
Patient ID: Vincent Goldmann Snelling Sr., male   DOB: [birth year], 65 y.o.   MRN: [id number] Doing better today no headache back and leg pain improving  Strength 5 out of 5 wound clean dry and intact  Continue to work on pain management today patient declined feeling up to being discharged hopefully plan discharge tomorrow

## 2015-10-11 NOTE — Progress Notes (Addendum)
Occupational Therapy Treatment and Discharge Patient Details Name: Vincent E Keating Sr. MRN: [id number] DOB: 12/09/1950 Today's Date: 10/11/2015    History of present illness 65 y.o. male s/p Right Lumbar four-five ,lumbar five sacral-one Laminectomy. Hx of DM and HTN   OT comments  Pt able to meet goals adequately for d/c from OT services. Currently pt is overall supervision for safety with functional mobility and min guard for shower transfer. Pt able to verbally recall 2/3 precautions at start of session but demonstrating good ability to maintain with functional activities. No further acute OT needs identified; will sign off for OT at this time. Please re-consult if needs change.     Follow Up Recommendations  No OT follow up;Supervision - Intermittent    Equipment Recommendations  3 in 1 bedside comode ( AE)    Recommendations for Other Services      Precautions / Restrictions Precautions Precautions: Back Precaution Comments: Pt able to verbally recall 2/3 back precautions at start of session. Reviewed all precautions. Pt able to maintain throughout functional activities. Restrictions Weight Bearing Restrictions: No       Mobility Bed Mobility               General bed mobility comments: Pt OOB in chair upon arrival.  Transfers Overall transfer level: Needs assistance Equipment used: Rolling walker (2 wheeled) Transfers: Sit to/from Stand Sit to Stand: Supervision         General transfer comment: Supervision for safety and pt able to maintain back precautions throughout session. Sit to stand from chair x 1, toilet x 1 with VCs for hand placement.    Balance Overall balance assessment: Needs assistance Sitting-balance support: No upper extremity supported;Feet supported Sitting balance-Leahy Scale: Fair     Standing balance support: During functional activity;No upper extremity supported Standing balance-Leahy Scale: Fair Standing balance comment: Pt able  to stand at sink and wash hands without UE support                   ADL Overall ADL's : Needs assistance/impaired     Grooming: Supervision/safety;Wash/dry hands;Standing                   Toilet Transfer: Supervision/safety;Ambulation;Regular Toilet;Grab bars;RW   Toileting- Clothing Manipulation and Hygiene: Supervision/safety;Sit to/from stand   Tub/ Shower Transfer: Min guard;Walk-in shower;Ambulation;Rolling walker Tub/Shower Transfer Details (indicate cue type and reason): Pt reports he has a walk in shower at home. Educated pt on walk in shower transfer technique and pt able to return demo with min guard assist for safety. Discussed need for supervision with shower transfers upon return home; pt agreeable. Functional mobility during ADLs: Supervision/safety;Rolling walker General ADL Comments: No family present for OT session. Educated on home safety, reviewed precautions with pt during functional activities.      Vision                     Perception     Praxis      Cognition   Behavior During Therapy: WFL for tasks assessed/performed Overall Cognitive Status: Within Functional Limits for tasks assessed       Memory: Decreased recall of precautions               Extremity/Trunk Assessment               Exercises     Shoulder Instructions       General Comments      Pertinent Vitals/   Pain       Pain Assessment: 0-10 Pain Score: 4  Pain Location: back, RLE Pain Descriptors / Indicators: Aching;Shooting Pain Intervention(s): Limited activity within patient's tolerance;Monitored during session  Home Living                                          Prior Functioning/Environment              Frequency Min 2X/week     Progress Toward Goals  OT Goals(current goals can now be found in the care plan section)  Progress towards OT goals: Goals met/education completed, patient discharged from  OT  Acute Rehab OT Goals Patient Stated Goal: home tomorrow  Plan All goals met and education completed, patient discharged from OT services    Co-evaluation                 End of Session Equipment Utilized During Treatment: Gait belt;Rolling walker   Activity Tolerance Patient tolerated treatment well   Patient Left in chair;with call bell/phone within reach   Nurse Communication          Time: 5003-7048 OT Time Calculation (min): 16 min  Charges: OT General Charges $OT Visit: 1 Procedure OT Treatments $Self Care/Home Management : 8-22 mins  Binnie Kand M.S., OTR/L Pager: [phone]  10/11/2015, 11:04 AM

## 2015-10-12 DIAGNOSIS — M4322 Fusion of spine, cervical region: Secondary | ICD-10-CM | POA: Diagnosis not present

## 2015-10-12 LAB — URINE CULTURE

## 2015-10-12 LAB — GLUCOSE, CAPILLARY: Glucose-Capillary: 288 mg/dL — ABNORMAL HIGH (ref 65–99)

## 2015-10-12 NOTE — Progress Notes (Signed)
Discharge instructions received. RN discussed discharge instructions with patient including follow up appointment with dr. Joya Salm in 10 days which patient requested to shceduled himself. Discussed prescriptions given by md, prescriptions and discharge instructions given to patient. IV removed, neuro assessment unchanged, no pt needs. Patient states he is ready and comfortable to be discharged today. Pt had bm this am, voiding well. Patient waiting on transportation, patient to be transported by wheelchair to car. Will continue to monitor

## 2015-10-12 NOTE — Discharge Summary (Signed)
Physician Discharge Summary  Patient ID: Vincent Bombara Blum Sr. MRN: [id number] DOB/AGE: 63/07/1951 63 y.o.  Admit date: 10/07/2015 Discharge date: 10/12/2015  Admission Diagnoses:lumbar stenosis.Marland Kitchen obesity  Discharge Diagnoses:  Active Problems:   Lumbar stenosis   Discharged Condition: no pain  Hospital Course: surgery  Consults: none  Significant Diagnostic Studies:myelogram  Treatments: lumbar laminectomies  Discharge Exam: Blood pressure 170/99, pulse 102, temperature 98.6 F (37 C), temperature source Oral, resp. rate 18, height 5' 6.5" (1.689 m), weight 124.5 kg (274 lb 7.6 oz), SpO2 94 %. ambulating  Disposition: home     Medication List    ASK your doctor about these medications        amLODipine 10 MG tablet  Commonly known as:  NORVASC  Take 5 mg by mouth daily.     aspirin EC 81 MG tablet  Take 81 mg by mouth daily.     enalapril 20 MG tablet  Commonly known as:  VASOTEC  Take 20 mg by mouth daily.     hydrALAZINE 50 MG tablet  Commonly known as:  APRESOLINE  Take 50 mg by mouth 2 (two) times daily.     HYDROcodone-acetaminophen 10-325 MG tablet  Commonly known as:  NORCO  Take 1 tablet by mouth every 6 (six) hours as needed for moderate pain.     insulin glargine 100 UNIT/ML injection  Commonly known as:  LANTUS  Inject 30 Units into the skin at bedtime.     JANUVIA 100 MG tablet  Generic drug:  sitaGLIPtin  Take 100 mg by mouth daily.     potassium chloride SA 20 MEQ tablet  Commonly known as:  K-DUR,KLOR-CON  Take 1 tablet (20 mEq total) by mouth daily. Patient needs appointment for future refills.     tetrahydrozoline 0.05 % ophthalmic solution     vitamin E 400 UNIT capsule  Take 400 Units by mouth daily as needed (for supplementation).         Signed: Floyce Ramos 10/12/2015, 9:12 AM

## 2015-10-12 NOTE — Care Management Important Message (Signed)
Important Message  Patient Details  Name: Vincent Lotz Nill Sr. MRN: [id number] Date of Birth: [birth year]   Medicare Important Message Given:  Yes    Barb Merino Kourosh Jablonsky 10/12/2015, 12:22 PM

## 2015-10-12 NOTE — Care Management Note (Signed)
Case Management Note  Patient Details  Name: Vincent Muston Elahi Sr. MRN: [id number] Date of Birth: [birth year]  Subjective/Objective:                    Action/Plan: Patient discharging home with self care but with orders for wide walker and 3 in 1. Patient standing in room with ride outside when CM went to speak with him about equipment. Patient not willing to wait for equipment to come to the room. Patient given orders for the equipment and the address to [facility] store in [place]. Patient states he will get the equipment tomorrow. Bedside RN updated.   Expected Discharge Date:                  Expected Discharge Plan:  Home/Self Care  In-House Referral:     Discharge planning Services  CM Consult  Post Acute Care Choice:  Durable Medical Equipment Choice offered to:  Patient  DME Arranged:  3-N-1, Walker wide DME Agency:  [place]:    [place]:     Status of Service:  Completed, signed off  Medicare Important Message Given:  Yes Date Medicare IM Given:    Medicare IM give by:    Date Additional Medicare IM Given:    Additional Medicare Important Message give by:     If discussed at [place] of Stay Meetings, dates discussed:    Additional Comments:  Pollie Friar, RN 10/12/2015, 10:27 AM

## 2015-10-12 NOTE — Discharge Instructions (Signed)
Spinal Fusion Spinal fusion is a procedure to make 2 or more of your back bones (vertebrae) grow together (fuse). This stops movement between the back bones. This can help decrease pain. BEFORE THE PROCEDURE  You will have a physical exam, blood tests, and imaging exams.  You will talk with the person who will give you medicine that makes you sleep (general anesthetic) during the procedure.  Ask your doctor about changing or stopping your medicines.  If you smoke, stop smoking 2 weeks before the procedure.  Do not eat or drink anything for 8 hours before the procedure. PROCEDURE  A cut (incision) is made over the back bones.  The back muscles are parted from the back bones.  If the cushion (disc) between your back bones is causing problems, it will be removed. The area where the cushion was will be filled with extra bone. Bone may be taken from another part of your body or from a bone donor. The extra bone helps your back bones grow together.  Sometimes, medicines (bone-forming proteins) are added to the area to help you heal. In most cases, screws, rods, or metal plates are used to keep the back bones in place while they grow together. Sometimes, this procedure is done from the front of the spine. In that case, a cut may be made in your side or belly (abdomen).  AFTER THE PROCEDURE  You will stay in a recovery area until you are awake. Your blood pressure and heart will be checked.  You will be given medicine (antibiotics) to prevent infection.  You may continue to get fluids through a tube in your vein (IV).  You should expect some pain after surgery. You will be given pain medicine.  You will be taught how to move, stand, and walk. While in bed, you will be told to turn often. You will be told to roll like a log, so you can move your whole body and not twist your back. Do not turn on your own until you are told to do so.   This information is not intended to replace advice given  to you by your health care provider. Make sure you discuss any questions you have with your health care provider.   Document Released: 11/11/2010 Document Revised: 10/10/2011 Document Reviewed: 12/31/2014 Elsevier Interactive Patient Education [employer].

## 2015-10-30 ENCOUNTER — Other Ambulatory Visit: Payer: Self-pay | Admitting: *Deleted

## 2015-10-30 NOTE — Patient Outreach (Signed)
[place] [facility]) Care Management  10/30/2015  Vincent Goetze Babiarz Sr. Oct 01, 1950 [id number]   Referral from [facility] for symptom & disease management: Telephone call to patient; left message on voice mail requesting call back.   Plan: Will follow up. Sherrin Daisy, RN BSN [place] Management Coordinator [facility] Care Management  [phone]

## 2015-11-02 ENCOUNTER — Other Ambulatory Visit: Payer: Self-pay | Admitting: *Deleted

## 2015-11-02 NOTE — Patient Outreach (Signed)
[facility]) Care Management  11/02/2015  Hoyt Varano Knight Sr. 1950/08/06 [id number]   Referral from [facility] care Management:  [employer]   Telephone call to patient who was advised of reason for call & [facility] care management services. Patient gave HIPPA verification. Patient voices that he was recently hospitalized for back surgery from 03/08-03/13/2017. States he is currently able to walk without cane and is able to do self care. States spouse takes him to MD appointments. States he had stitches removed 10/30/2015 at surgeon's office-Dr. Freada Bergeron. States appointment set for pain management 04/04 and appointment 5/17 with Dr. Legrand Rams. States currently his pain is under control with his pain medication. States no problems getting prescriptions filled. Patient states he will have outpatient therapy in [place] when instructed by his doctor. Currently activity includes walking circumference of his home. States other conditions include diabetes-insulin dependent, HTN , recent pneumonia. (last noted "A1c level was 9.3 in EPIC-09/29/2015) . Current weight-165, Height -5' 6.5" .  Recommendations for community care coordinator for transition of care. Patient consents to [facility] care management services.  Plan: will send to care management assistant to refer to community care coordinator for complex case management.  Telephonic signing off.  Sherrin Daisy, RN BSN [place] Management Coordinator [facility] Care Management  [phone]

## 2015-11-03 DIAGNOSIS — Z79899 Other long term (current) drug therapy: Secondary | ICD-10-CM | POA: Diagnosis not present

## 2015-11-03 DIAGNOSIS — M545 Low back pain: Secondary | ICD-10-CM | POA: Diagnosis not present

## 2015-11-03 DIAGNOSIS — Z5181 Encounter for therapeutic drug level monitoring: Secondary | ICD-10-CM | POA: Diagnosis not present

## 2015-11-03 DIAGNOSIS — M5430 Sciatica, unspecified side: Secondary | ICD-10-CM | POA: Diagnosis not present

## 2015-11-03 DIAGNOSIS — Z79891 Long term (current) use of opiate analgesic: Secondary | ICD-10-CM | POA: Diagnosis not present

## 2015-11-03 DIAGNOSIS — M4806 Spinal stenosis, lumbar region: Secondary | ICD-10-CM | POA: Diagnosis not present

## 2015-11-05 ENCOUNTER — Other Ambulatory Visit: Payer: Self-pay | Admitting: *Deleted

## 2015-11-05 NOTE — Patient Outreach (Signed)
Call to patient in regard to referral. Patient agrees to home visit. Scheduled initial home visit for next week. Vincent Ramos. Laymond Purser, RN, BSN, [place] [phone]

## 2015-11-10 ENCOUNTER — Other Ambulatory Visit: Payer: Self-pay | Admitting: *Deleted

## 2015-11-10 ENCOUNTER — Ambulatory Visit: Payer: Self-pay | Admitting: *Deleted

## 2015-11-10 NOTE — Patient Outreach (Signed)
Call to patient before visit as requested. Patient now reports he has an appointment at 12:30 he had forgotten about and does not feel he can be home for our scheduled visit this afternoon. He requests RNCM call back next week to reschedule. Plan to call back next week to attempt to reschedule initial home visit. Royetta Crochet. Laymond Purser, RN, BSN, [place] [phone]

## 2015-11-12 ENCOUNTER — Ambulatory Visit (HOSPITAL_COMMUNITY): Payer: PPO | Attending: Neurosurgery | Admitting: Physical Therapy

## 2015-11-12 ENCOUNTER — Encounter (HOSPITAL_COMMUNITY): Payer: Self-pay | Admitting: Physical Therapy

## 2015-11-12 DIAGNOSIS — R262 Difficulty in walking, not elsewhere classified: Secondary | ICD-10-CM | POA: Insufficient documentation

## 2015-11-12 DIAGNOSIS — M545 Low back pain, unspecified: Secondary | ICD-10-CM

## 2015-11-12 DIAGNOSIS — M6281 Muscle weakness (generalized): Secondary | ICD-10-CM | POA: Insufficient documentation

## 2015-11-12 DIAGNOSIS — R29898 Other symptoms and signs involving the musculoskeletal system: Secondary | ICD-10-CM | POA: Diagnosis not present

## 2015-11-13 NOTE — Patient Instructions (Signed)
Bridge 2x10 Abdominal bracing: hold 5 sec, repeat 20 times Clamshells 2x10 with green TB Piriformis stretch 3x30sec hamstring stretch 3x30sec

## 2015-11-13 NOTE — Therapy (Addendum)
[place], [place], [address] Phone: [phone]   Fax:  [phone]  Physical Therapy Evaluation  Patient Details  Name: Vincent Bayles Venus Sr. MRN: [id number] Date of Birth: 07-03-1951 Referring Provider: Leeroy Cha, MD  Encounter Date: 11/12/2015      PT End of Session - 11/12/15 2129    Visit Number 1   Number of Visits 13   Date for PT Re-Evaluation 12/03/15   Authorization Type Health team advantage   Authorization Time Period 11/12/15 to 12/24/15   PT Start Time 0900   PT Stop Time 0945   PT Time Calculation (min) 45 min   Activity Tolerance Patient tolerated treatment well   Behavior During Therapy [facility] for tasks assessed/performed      Past Medical History  Diagnosis Date  . Diabetes mellitus   . Hypertension   . Shortness of breath   . Pneumonia   . Arthritis   . Enlarged prostate     Past Surgical History  Procedure Laterality Date  . Laceration repair      left hand  . Circumcision    . Hydrocele excision  10/14/2011    Procedure: HYDROCELECTOMY ADULT;  Surgeon: Malka So, MD;  Location: [facility];  Service: Urology;  Laterality: Right;  . Lumbar laminectomy/decompression microdiscectomy Right 10/07/2015    Procedure: Right Lumbar four-five ,lumbar five sacral-one Laminectomy;  Surgeon: Leeroy Cha, MD;  Location: [place] [facility];  Service: Neurosurgery;  Laterality: Right;    There were no vitals filed for this visit.       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 He started noting radiculopathy in his RLE and weakness with his knee occasionally giving out on him. He had a laminectomy/decompression surgery on 10/07/15 and has not experienced any symptoms since then. He feels like he is healing well. Occasionally has pain in his R hip with increased activity and noticed his RLE feels weak compared to  his L.    Pertinent History HTN, DM, Lx stenosis with lamicetomy/decompression of L4/L5/Sl   Limitations Lifting   How long can you sit comfortably? unlimited   How long can you stand comfortably? unlimited   How long can you walk comfortably? unlimited   Patient Stated Goals increase RLE strength   Currently in Pain? No/denies            [facility] PT Assessment - 11/13/15 0001    Assessment   Medical Diagnosis Spinal stenosis s/p laminectomy/decompression L4-S1   Onset Date/Surgical Date 10/07/15   Next MD Visit none   Prior Therapy none   Precautions   Precautions Back   Precaution Comments Pt stating he is not aware of any back precautions at this time but MD provided book of information that he left at home   Balance Screen   Has the patient fallen in the past 6 months No   Has the patient had a decrease in activity level because of a fear of falling?  No   Is the patient reluctant to leave their home because of a fear of falling?  No   Home Environment   Living Environment Private residence   Living Arrangements Spouse/significant other   [place] to enter   Prior Function   Level of Independence Independent   Vocation On disability   Leisure walking, watch drag racing/cars  Observation/Other Assessments   Observations incision intact and healing well   Sensation   Light Touch Appears Intact   Posture/Postural Control   Posture/Postural Control Postural limitations   Postural Limitations Rounded Shoulders;Forward head;Decreased lumbar lordosis   AROM   Overall AROM  Unable to assess  lumbar ROM deferred secondary to post op precautions   Strength   Right Hip Flexion 4-/5   Right Hip Extension 2+/5   Right Hip ABduction 3+/5   Left Hip Flexion 5/5   Left Hip Extension 4-/5   Left Hip ABduction 4+/5   Right Knee Flexion 5/5   Right Knee Extension 4+/5   Right Ankle Dorsiflexion 4+/5   Left Ankle Dorsiflexion 5/5   Flexibility   Soft Tissue Assessment  /Muscle Length yes   Hamstrings L/R: 35/40   Quadriceps (+) thomas for hip flexor and quad tightness, BLE   Piriformis 50% limited B   Palpation   Palpation comment TTP along L piriformis, noting TrP throughout L glute region    Transfers   Five time sit to stand comments  13 sec with use of UE   Ambulation/Gait   Ambulation/Gait Yes   Ambulation/Gait Assistance 7: Independent   Ambulation Distance (Feet) 50 Feet   Assistive device Straight cane   Gait Pattern Step-through pattern;Trunk flexed;Wide base of support   Ambulation Surface Level   Gait Comments Pt noting he occasionally feels like his Rt LE might buckle on him when he is fatigued, so he uses [facility] for long distances                           PT Education - 11/12/15 2129    Education provided Yes   Education Details Discussed precautions and bringing handout from surgeon to next visit; log rolling technique; initiated HEP; discussed POC   Person(s) Educated Patient   Methods Explanation;Demonstration   Comprehension Verbalized understanding;Returned demonstration          PT Short Term Goals - 11/12/15 2135    PT SHORT TERM GOAL #1   Title Pt to be indep and compliant with updated HEP   Time 2   Period Weeks   Status New   PT SHORT TERM GOAL #2   Title Pt to report max pain of less than 4/10 VAS to improve tolerance with daily activity.   Time 3   Period Weeks   Status New   PT SHORT TERM GOAL #3   Title Pt to verbalize surgical precautions and demonstrate proper log roll technique consistently during his session and without assistance from therapist   Time 3   Period Weeks   Status New           PT Long Term Goals - 11/12/15 2137    PT LONG TERM GOAL #1   Title Pt to demonstrate pain rating <2/10 VAS for improved tolerance to activity and exercise during his sessions.   Time 6   Period Weeks   Status New   PT LONG TERM GOAL #2   Title Pt to ambulate over even/uneven surfaces  >538ft without AD and without LOB/fear of falling to improve his independence at home.   Time 6   Period Weeks   Status New   PT LONG TERM GOAL #3   Title Pt will demonstrate improved BLE strength to atleast 4+/5 throughout for improved stair negotiation.    Time 6   Period Weeks   Status New  PT LONG TERM GOAL #4   Title Pt will demonstrate improved flexilbilty evident by negative thomas test, to improve his posture during walking and other activity.    Time 6   Period Weeks   Status New               Plan - Nov 20, 2015 2131    Clinical Impression Statement Patient presents to OPPT s/p lumbar laminectomy/decompression surgery on 10/07/15. He has been referred by Leeroy Cha, MD to improve LE and abdominal strength. Upon examination, patient presents with poor posture and limitations in LLE strength, trunk strength/endurance, flexibility, and requiring cues for following post-surgical precautions. His scar is healing/intact, and palpation reveals soft tissue restrictions and guarding throughout L glute region/lumbar paraspinals. Established initial HEP today and discussed POC with pt in agreement and verbalizing understanding. Therapist also reviewed precautions of limited rotation and flexion of the spine, and encouraged him to bring his booklet to his next session. Recommend skilled PT services in order to address the listed impairments and improve overall function and participation in daily activity.   Rehab Potential Good   PT Frequency 2x / week   PT Duration 6 weeks   PT Treatment/Interventions ADLs/Self Care Home Management;Biofeedback;Cryotherapy;Moist Heat;Electrical Stimulation;Therapeutic exercise;Gait training;Stair training;Neuromuscular re-education;Functional mobility training;Therapeutic activities;Patient/family education;Manual techniques;Balance training;Scar mobilization;Passive range of motion   PT Next Visit Plan 3MWT; progress abdominal bracing and LE strengthening  R>L as tolerated; check to see if pt brings book of precautions for review   PT Home Exercise Plan initiated HEP; abdominal brace, piriformis, hamstring stretch, bridge, camshells   Recommended Other Services none   Consulted and Agree with Plan of Care Patient      Patient will benefit from skilled therapeutic intervention in order to improve the following deficits and impairments:  Decreased activity tolerance, Decreased endurance, Abnormal gait, Decreased knowledge of precautions, Decreased strength, Decreased range of motion, Decreased mobility, Increased fascial restricitons, Increased muscle spasms, Pain, Improper body mechanics  Visit Diagnosis: Midline low back pain without sciatica - Plan: PT plan of care cert/re-cert  Muscle weakness (generalized) - Plan: PT plan of care cert/re-cert  Difficulty in walking, not elsewhere classified - Plan: PT plan of care cert/re-cert  Other symptoms and signs involving the musculoskeletal system - Plan: PT plan of care cert/re-cert     Problem List Patient Active Problem List   Diagnosis Date Noted  . Lumbar stenosis 10/07/2015  . CAP (community acquired pneumonia) 07/31/2014  . Diabetes ([place]) 07/31/2014  . Hypertension 07/31/2014  . Obesity 07/31/2014  . Community acquired pneumonia 07/31/2014    1:54 PM,11/13/2015 Elly Modena PT, DPT [facility] [place] [address] [place], [place], [address] Phone: [phone]   Fax:  [phone]  Name: Vincent Alpern Cott Sr. MRN: [id number] Date of Birth: Sep 18, 1950   *Addition of G-Codes to Eval on 11/20/2015:  Mobility and walking around Current: 40-60% limited Goal: 20-40% limited   8:13 AM,12/07/2015 Elly Modena PT, DPT [facility] [phone]

## 2015-11-17 ENCOUNTER — Ambulatory Visit (HOSPITAL_COMMUNITY): Payer: PPO

## 2015-11-17 DIAGNOSIS — M545 Low back pain, unspecified: Secondary | ICD-10-CM

## 2015-11-17 DIAGNOSIS — R29898 Other symptoms and signs involving the musculoskeletal system: Secondary | ICD-10-CM

## 2015-11-17 DIAGNOSIS — R262 Difficulty in walking, not elsewhere classified: Secondary | ICD-10-CM

## 2015-11-17 DIAGNOSIS — M6281 Muscle weakness (generalized): Secondary | ICD-10-CM

## 2015-11-17 NOTE — Therapy (Signed)
[place], [place], [address] Phone: [phone]   Fax:  [phone]  Physical Therapy Treatment  Patient Details  Name: Vincent Broker Hector Sr. MRN: [id number] Date of Birth: 1951-05-21 Referring Provider: Leeroy Cha, MD  Encounter Date: 11/17/2015      PT End of Session - 11/17/15 0908    Visit Number 2   Number of Visits 13   Date for PT Re-Evaluation 12/03/15   Authorization Type Health team advantage   Authorization Time Period 11/12/15 to 12/24/15   PT Start Time 0903   PT Stop Time 0943   PT Time Calculation (min) 40 min   Activity Tolerance Patient tolerated treatment well   Behavior During Therapy [facility] for tasks assessed/performed      Past Medical History  Diagnosis Date  . Diabetes mellitus   . Hypertension   . Shortness of breath   . Pneumonia   . Arthritis   . Enlarged prostate     Past Surgical History  Procedure Laterality Date  . Laceration repair      left hand  . Circumcision    . Hydrocele excision  10/14/2011    Procedure: HYDROCELECTOMY ADULT;  Surgeon: Malka So, MD;  Location: [facility];  Service: Urology;  Laterality: Right;  . Lumbar laminectomy/decompression microdiscectomy Right 10/07/2015    Procedure: Right Lumbar four-five ,lumbar five sacral-one Laminectomy;  Surgeon: Leeroy Cha, MD;  Location: [place] [facility];  Service: Neurosurgery;  Laterality: Right;    There were no vitals filed for this visit.      Subjective Assessment - 11/17/15 0904    Subjective Pt stated back is feeling good today, able to reduce pain medication.  Reports compliance with HEP. Currently pain free.   Pertinent History HTN, DM, Lx stenosis with lamicetomy/decompression of L4/L5/Sl   Patient Stated Goals increase RLE strength   Currently in Pain? No/denies            [facility] PT Assessment - 11/17/15 0001    Ambulation/Gait   Ambulation/Gait Yes   Ambulation/Gait Assistance 7: Independent    Ambulation Distance (Feet) 704 Feet  704 feet in 3MWT   Assistive device None   Gait Pattern Step-through pattern;Trunk flexed;Wide base of support   Ambulation Surface Level   Gait Comments 3MWT in 704 feet            [facility] Adult PT Treatment/Exercise - 11/17/15 0001    Exercises   Exercises Lumbar   Lumbar Exercises: Stretches   Active Hamstring Stretch 3 reps;30 seconds   Active Hamstring Stretch Limitations supine with rop   Piriformis Stretch 3 reps;30 seconds   Piriformis Stretch Limitations supine with towel   Lumbar Exercises: Standing   Other Standing Lumbar Exercises 3MWT 753ft no AD   Lumbar Exercises: Supine   Ab Set 10 reps;3 seconds   AB Set Limitations core bracing sets with cueing for TrA activation   Clam 10 reps;Limitations   Clam Limitations RTB   Bridge 10 reps  2 sets with cueing for ab set   Lumbar Exercises: Sidelying   Hip Abduction 10 reps   Hip Abduction Limitations cueing for form                  PT Short Term Goals - 11/12/15 2135    PT SHORT TERM GOAL #1   Title Pt to be indep and compliant with updated HEP   Time 2   Period Weeks  Status New   PT SHORT TERM GOAL #2   Title Pt to report max pain of less than 4/10 VAS to improve tolerance with daily activity.   Time 3   Period Weeks   Status New   PT SHORT TERM GOAL #3   Title Pt to verbalize surgical precautions and demonstrate proper log roll technique consistently during his session and without assistance from therapist   Time 3   Period Weeks   Status New           PT Long Term Goals - 11/12/15 2137    PT LONG TERM GOAL #1   Title Pt to demonstrate pain rating <2/10 VAS for improved tolerance to activity and exercise during his sessions.   Time 6   Period Weeks   Status New   PT LONG TERM GOAL #2   Title Pt to ambulate over even/uneven surfaces >569ft without AD and without LOB/fear of falling to improve his independence at home.   Time 6   Period Weeks    Status New   PT LONG TERM GOAL #3   Title Pt will demonstrate improved BLE strength to atleast 4+/5 throughout for improved stair negotiation.    Time 6   Period Weeks   Status New   PT LONG TERM GOAL #4   Title Pt will demonstrate improved flexilbilty evident by negative thomas test, to improve his posture during walking and other activity.    Time 6   Period Weeks   Status New               Plan - 11/17/15 [id number]    Clinical Impression Statement Reviewed goals, compliance and proper techique with HEP and copy of eval given to pt.  Pt forgot to bring precaution booklet with him, encouraged to bring next session.  Pt able to demonstrate and verbalize appropriate form and technqiue with log roll technqiues getting supine and sitting.  3MWT test complete with no AD in 704 feet with no rest breaks required and no c/o Rt LE buckling him up.  Session focus on improving and LE mobility and core strengthening.with cueing for form and technique to improve core activation.  No reports of increased pain through session.     Rehab Potential Good   PT Frequency 2x / week   PT Duration 6 weeks   PT Treatment/Interventions ADLs/Self Care Home Management;Biofeedback;Cryotherapy;Moist Heat;Electrical Stimulation;Therapeutic exercise;Gait training;Stair training;Neuromuscular re-education;Functional mobility training;Therapeutic activities;Patient/family education;Manual techniques;Balance training;Scar mobilization;Passive range of motion   PT Next Visit Plan progress abdominal bracing and LE strengthening R>L as tolerated; check to see if pt brings book of precautions for review      Patient will benefit from skilled therapeutic intervention in order to improve the following deficits and impairments:  Decreased activity tolerance, Decreased endurance, Abnormal gait, Decreased knowledge of precautions, Decreased strength, Decreased range of motion, Decreased mobility, Increased fascial restricitons,  Increased muscle spasms, Pain, Improper body mechanics  Visit Diagnosis: Midline low back pain without sciatica  Muscle weakness (generalized)  Difficulty in walking, not elsewhere classified  Other symptoms and signs involving the musculoskeletal system     Problem List Patient Active Problem List   Diagnosis Date Noted  . Lumbar stenosis 10/07/2015  . CAP (community acquired pneumonia) 07/31/2014  . Diabetes ([place]) 07/31/2014  . Hypertension 07/31/2014  . Obesity 07/31/2014  . Community acquired pneumonia 07/31/2014   Vincent Ramos, LPTA; [place]  Vincent Ramos 11/17/2015, 10:00 AM  [place]  [place] [address] [place], [place], [address] Phone: [phone]   Fax:  [phone]  Name: Vincent Schettler Dundon Sr. MRN: [id number] Date of Birth: 31-Oct-1950

## 2015-11-18 ENCOUNTER — Encounter (HOSPITAL_COMMUNITY): Payer: Self-pay | Admitting: Physical Therapy

## 2015-11-18 ENCOUNTER — Telehealth (HOSPITAL_COMMUNITY): Payer: Self-pay | Admitting: Physical Therapy

## 2015-11-18 NOTE — Telephone Encounter (Signed)
He has a nurse coming and can not be here today.

## 2015-11-19 ENCOUNTER — Other Ambulatory Visit: Payer: Self-pay | Admitting: *Deleted

## 2015-11-19 NOTE — Patient Outreach (Signed)
Call to patient to reschedule initial home visit. No answer, left message requesting call back. Will continue to attempt to call again next week to reschedule if no return call. Royetta Crochet. Laymond Purser, RN, BSN, [place] [phone]

## 2015-11-20 ENCOUNTER — Encounter: Payer: Self-pay | Admitting: *Deleted

## 2015-11-20 ENCOUNTER — Other Ambulatory Visit: Payer: Self-pay | Admitting: *Deleted

## 2015-11-20 NOTE — Patient Outreach (Signed)
Call to patient to attempt to reschedule home visit. Patient reporting he does not wish to participate with [facility] program at this time. He states he is active with therapy and is concentrating on therapy program. RNCM explained that he would receive a letter and if he decided he would like to participate in the future he could Call [facility] or the main office line. Patient verbalized understanding and agreement. PLAN: RNCM will send closure letter to patient and MD Will close case per protocol.  Royetta Crochet. Laymond Purser, RN, BSN, [place] [phone]

## 2015-11-24 ENCOUNTER — Telehealth (HOSPITAL_COMMUNITY): Payer: Self-pay

## 2015-11-24 ENCOUNTER — Encounter (HOSPITAL_COMMUNITY): Payer: Self-pay

## 2015-11-24 NOTE — Telephone Encounter (Signed)
11/24/15 left a message that his shoulder was hurting and can't come in

## 2015-11-24 NOTE — Telephone Encounter (Signed)
11/24/15  cx said his shoulder was in a lot of pain

## 2015-11-26 ENCOUNTER — Ambulatory Visit (HOSPITAL_COMMUNITY): Payer: PPO

## 2015-11-26 DIAGNOSIS — M545 Low back pain, unspecified: Secondary | ICD-10-CM

## 2015-11-26 DIAGNOSIS — R262 Difficulty in walking, not elsewhere classified: Secondary | ICD-10-CM

## 2015-11-26 DIAGNOSIS — M6281 Muscle weakness (generalized): Secondary | ICD-10-CM

## 2015-11-26 DIAGNOSIS — R29898 Other symptoms and signs involving the musculoskeletal system: Secondary | ICD-10-CM

## 2015-11-26 NOTE — Therapy (Signed)
[place], [place], [address] Phone: [phone]   Fax:  [phone]  Physical Therapy Treatment  Patient Details  Name: Vincent Windom Kesinger Sr. MRN: [id number] Date of Birth: June 16, 1951 Referring Provider: Leeroy Cha, MD  Encounter Date: 11/26/2015      PT End of Session - 11/26/15 0902    Visit Number 3   Number of Visits 13   Date for PT Re-Evaluation 12/03/15   Authorization Type Health team advantage   Authorization Time Period 11/12/15 to 12/24/15   PT Start Time 0846   PT Stop Time 0924   PT Time Calculation (min) 38 min   Activity Tolerance Patient tolerated treatment well   Behavior During Therapy [facility] for tasks assessed/performed      Past Medical History  Diagnosis Date  . Diabetes mellitus   . Hypertension   . Shortness of breath   . Pneumonia   . Arthritis   . Enlarged prostate     Past Surgical History  Procedure Laterality Date  . Laceration repair      left hand  . Circumcision    . Hydrocele excision  10/14/2011    Procedure: HYDROCELECTOMY ADULT;  Surgeon: Malka So, MD;  Location: [facility];  Service: Urology;  Laterality: Right;  . Lumbar laminectomy/decompression microdiscectomy Right 10/07/2015    Procedure: Right Lumbar four-five ,lumbar five sacral-one Laminectomy;  Surgeon: Leeroy Cha, MD;  Location: [place] [facility];  Service: Neurosurgery;  Laterality: Right;    There were no vitals filed for this visit.      Subjective Assessment - 11/26/15 0848    Subjective Function has been up and down since last visit. Mobility has been more limited in community distances, requiring SPC for groceries, etc. Reports his back from neck to hip has been extra achy with the cold rainy weather that moved throughout.    Pertinent History HTN, DM, Lx stenosis with lamicetomy/decompression of L4/L5/Sl   Limitations Lifting;Walking   How long can you sit comfortably? unlimited   How long can you  stand comfortably? 15 minutes   How long can you walk comfortably? 10-15 minutes on treadmill    Patient Stated Goals increase RLE strength   Currently in Pain? Yes   Pain Score 5    Pain Location --  Right lower back, and R lateral thigh.    Pain Descriptors / Indicators Tightness   Pain Type Chronic pain                         [facility] Adult PT Treatment/Exercise - 11/26/15 0001    Lumbar Exercises: Stretches   Active Hamstring Stretch 3 reps;30 seconds   Active Hamstring Stretch Limitations supine with rop   Quad Stretch --   Piriformis Stretch 3 reps;30 seconds   Piriformis Stretch Limitations supine with towel   Lumbar Exercises: Supine   Ab Set 10 reps;3 seconds  2x10, crunches!    AB Set Limitations core bracing sets with cueing for TrA activation   Clam 10 reps;Limitations  2x10 blueTB   Clam Limitations RTB   Bridge 10 reps  2 sets with cueing for ab set   Lumbar Exercises: Sidelying   Hip Abduction 10 reps  cues for form    Hip Abduction Weights (lbs) 2x10 bilat, chair for support                PT Education - 11/26/15 [id number]  Education provided No          PT Short Term Goals - 11/12/15 2135    PT SHORT TERM GOAL #1   Title Pt to be indep and compliant with updated HEP   Time 2   Period Weeks   Status New   PT SHORT TERM GOAL #2   Title Pt to report max pain of less than 4/10 VAS to improve tolerance with daily activity.   Time 3   Period Weeks   Status New   PT SHORT TERM GOAL #3   Title Pt to verbalize surgical precautions and demonstrate proper log roll technique consistently during his session and without assistance from therapist   Time 3   Period Weeks   Status New           PT Long Term Goals - 11/12/15 2137    PT LONG TERM GOAL #1   Title Pt to demonstrate pain rating <2/10 VAS for improved tolerance to activity and exercise during his sessions.   Time 6   Period Weeks   Status New   PT LONG TERM GOAL #2    Title Pt to ambulate over even/uneven surfaces >548ft without AD and without LOB/fear of falling to improve his independence at home.   Time 6   Period Weeks   Status New   PT LONG TERM GOAL #3   Title Pt will demonstrate improved BLE strength to atleast 4+/5 throughout for improved stair negotiation.    Time 6   Period Weeks   Status New   PT LONG TERM GOAL #4   Title Pt will demonstrate improved flexilbilty evident by negative thomas test, to improve his posture during walking and other activity.    Time 6   Period Weeks   Status New               Plan - 11/26/15 [id number]    Clinical Impression Statement Pt making some progress toward goals. Good compliance with HEP at home. Hip mobility is improving. Abdominal weakness and distention remains a concern, especially in the setting of a 4cm upper abdominal diastasis rectis. Activation of deep abdominal layers appears to be adequate and seperate from superfiscial abd mm, however the patient still has difficulty dissassociating the transverse abdominus from the diaphragm.  Will continue with POC as previously outlined.    Rehab Potential Good   PT Frequency 2x / week   PT Duration 6 weeks   PT Treatment/Interventions ADLs/Self Care Home Management;Biofeedback;Cryotherapy;Moist Heat;Electrical Stimulation;Therapeutic exercise;Gait training;Stair training;Neuromuscular re-education;Functional mobility training;Therapeutic activities;Patient/family education;Manual techniques;Balance training;Scar mobilization;Passive range of motion   PT Next Visit Plan continue with core strengthening and activation; booklet provides no indication of precautions for patient.    PT Home Exercise Plan no changes at this time.    Consulted and Agree with Plan of Care Patient      Patient will benefit from skilled therapeutic intervention in order to improve the following deficits and impairments:  Decreased activity tolerance, Decreased endurance, Abnormal  gait, Decreased knowledge of precautions, Decreased strength, Decreased range of motion, Decreased mobility, Increased fascial restricitons, Increased muscle spasms, Pain, Improper body mechanics  Visit Diagnosis: Midline low back pain without sciatica  Muscle weakness (generalized)  Difficulty in walking, not elsewhere classified  Other symptoms and signs involving the musculoskeletal system     Problem List Patient Active Problem List   Diagnosis Date Noted  . Lumbar stenosis 10/07/2015  . CAP (community acquired pneumonia) 07/31/2014  .  Diabetes ([place]) 07/31/2014  . Hypertension 07/31/2014  . Obesity 07/31/2014  . Community acquired pneumonia 07/31/2014    9:29 AM, 11/26/2015 Etta Grandchild, PT, DPT PRN Physical Therapist at [place] # [id number] (office)     [place] [address] [place], [place], [address] Phone: [phone]   Fax:  [phone]  Name: Vincent Wirkkala Hagberg Sr. MRN: [id number] Date of Birth: [birth year]

## 2015-12-01 ENCOUNTER — Ambulatory Visit (HOSPITAL_COMMUNITY): Payer: PPO | Attending: Internal Medicine

## 2015-12-01 DIAGNOSIS — R262 Difficulty in walking, not elsewhere classified: Secondary | ICD-10-CM | POA: Diagnosis not present

## 2015-12-01 DIAGNOSIS — M6281 Muscle weakness (generalized): Secondary | ICD-10-CM | POA: Diagnosis not present

## 2015-12-01 DIAGNOSIS — M545 Low back pain, unspecified: Secondary | ICD-10-CM

## 2015-12-01 DIAGNOSIS — R29898 Other symptoms and signs involving the musculoskeletal system: Secondary | ICD-10-CM | POA: Insufficient documentation

## 2015-12-01 NOTE — Therapy (Signed)
[place], [place], [address] Phone: [phone]   Fax:  [phone]  Physical Therapy Treatment  Patient Details  Name: Vincent Rothermel Franqui Sr. MRN: [id number] Date of Birth: 05/07/1951 Referring Provider: Leeroy Cha, MD  Encounter Date: 12/01/2015      PT End of Session - 12/01/15 0911    Visit Number 4   Number of Visits 13   Date for PT Re-Evaluation 12/03/15   Authorization Type Health team advantage   Authorization Time Period 11/12/15 to 12/24/15   PT Start Time 0902   PT Stop Time 0948   PT Time Calculation (min) 46 min   Activity Tolerance Patient tolerated treatment well   Behavior During Therapy [facility] for tasks assessed/performed      Past Medical History  Diagnosis Date  . Diabetes mellitus   . Hypertension   . Shortness of breath   . Pneumonia   . Arthritis   . Enlarged prostate     Past Surgical History  Procedure Laterality Date  . Laceration repair      left hand  . Circumcision    . Hydrocele excision  10/14/2011    Procedure: HYDROCELECTOMY ADULT;  Surgeon: Malka So, MD;  Location: [facility];  Service: Urology;  Laterality: Right;  . Lumbar laminectomy/decompression microdiscectomy Right 10/07/2015    Procedure: Right Lumbar four-five ,lumbar five sacral-one Laminectomy;  Surgeon: Leeroy Cha, MD;  Location: [place] [facility];  Service: Neurosurgery;  Laterality: Right;    There were no vitals filed for this visit.      Subjective Assessment - 12/01/15 0908    Subjective Pt stated he is doing well with pain scale 2/10 in center of lower back and Rt anterior thigh.  Pt entered dept with no Ad, reports he continues to take [facility] for long distance walking while shopping, etc.     Pertinent History HTN, DM, Lx stenosis with lamicetomy/decompression of L4/L5/Sl   Patient Stated Goals increase RLE strength   Currently in Pain? Yes   Pain Location Back  center of lower back and Rt anterior  thigh   Pain Orientation Lower   Pain Descriptors / Indicators Aching   Pain Type Chronic pain              [facility] Adult PT Treatment/Exercise - 12/01/15 0001    Lumbar Exercises: Stretches   Active Hamstring Stretch 3 reps;30 seconds   Active Hamstring Stretch Limitations supine with rope   Lower Trunk Rotation --  10x 10" for core activation   Quad Stretch 2 reps;20 seconds  standing hip flexor stretch 12in step   Piriformis Stretch 2 reps;30 seconds   Piriformis Stretch Limitations supine with towel   Lumbar Exercises: Supine   Ab Set 10 reps;3 seconds   AB Set Limitations core bracing sets with cueing for TrA activation   Bridge 10 reps  2 sets x 10 reps with cueing for ab set   Straight Leg Raise 10 reps  with UE pressdown for core activation   Lumbar Exercises: Sidelying   Hip Abduction 10 reps   Hip Abduction Weights (lbs) 2x10 bilateral; cueing to reduce hip flexion                  PT Short Term Goals - 11/12/15 2135    PT SHORT TERM GOAL #1   Title Pt to be indep and compliant with updated HEP   Time 2   Period Weeks  Status New   PT SHORT TERM GOAL #2   Title Pt to report max pain of less than 4/10 VAS to improve tolerance with daily activity.   Time 3   Period Weeks   Status New   PT SHORT TERM GOAL #3   Title Pt to verbalize surgical precautions and demonstrate proper log roll technique consistently during his session and without assistance from therapist   Time 3   Period Weeks   Status New           PT Long Term Goals - 11/12/15 2137    PT LONG TERM GOAL #1   Title Pt to demonstrate pain rating <2/10 VAS for improved tolerance to activity and exercise during his sessions.   Time 6   Period Weeks   Status New   PT LONG TERM GOAL #2   Title Pt to ambulate over even/uneven surfaces >564ft without AD and without LOB/fear of falling to improve his independence at home.   Time 6   Period Weeks   Status New   PT LONG TERM GOAL #3    Title Pt will demonstrate improved BLE strength to atleast 4+/5 throughout for improved stair negotiation.    Time 6   Period Weeks   Status New   PT LONG TERM GOAL #4   Title Pt will demonstrate improved flexilbilty evident by negative thomas test, to improve his posture during walking and other activity.    Time 6   Period Weeks   Status New               Plan - 12/01/15 [id number]    Clinical Impression Statement Pt continues to demonstrate increased difficulty with TrA activation.  Added core bracing activities including SLR with UE press down and LTR to improve transverse abdominis activation, moderate cueing required to improve activation today.  Noted tight quadriceps, added hip flexor stretches.  No reports of increased back pain, did report increased pain Rt thigh pain following stretches.     Rehab Potential Good   PT Frequency 2x / week   PT Duration 6 weeks   PT Treatment/Interventions ADLs/Self Care Home Management;Biofeedback;Cryotherapy;Moist Heat;Electrical Stimulation;Therapeutic exercise;Gait training;Stair training;Neuromuscular re-education;Functional mobility training;Therapeutic activities;Patient/family education;Manual techniques;Balance training;Scar mobilization;Passive range of motion   PT Next Visit Plan continue with core strengthening and activation; booklet provides no indication of precautions for patient.       Patient will benefit from skilled therapeutic intervention in order to improve the following deficits and impairments:  Decreased activity tolerance, Decreased endurance, Abnormal gait, Decreased knowledge of precautions, Decreased strength, Decreased range of motion, Decreased mobility, Increased fascial restricitons, Increased muscle spasms, Pain, Improper body mechanics  Visit Diagnosis: Midline low back pain without sciatica  Muscle weakness (generalized)  Difficulty in walking, not elsewhere classified  Other symptoms and signs involving  the musculoskeletal system     Problem List Patient Active Problem List   Diagnosis Date Noted  . Lumbar stenosis 10/07/2015  . CAP (community acquired pneumonia) 07/31/2014  . Diabetes ([place]) 07/31/2014  . Hypertension 07/31/2014  . Obesity 07/31/2014  . Community acquired pneumonia 07/31/2014   Ihor Austin, [place]; [place]  Aldona Lento 12/01/2015, 12:52 PM  [place] [address] [place], [place], [address] Phone: [phone]   Fax:  [phone]  Name: Vincent Reisenauer Glasscock Sr. MRN: [id number] Date of Birth: 11/22/1950

## 2015-12-03 ENCOUNTER — Ambulatory Visit (HOSPITAL_COMMUNITY): Payer: PPO

## 2015-12-03 DIAGNOSIS — M6281 Muscle weakness (generalized): Secondary | ICD-10-CM

## 2015-12-03 DIAGNOSIS — R29898 Other symptoms and signs involving the musculoskeletal system: Secondary | ICD-10-CM

## 2015-12-03 DIAGNOSIS — M545 Low back pain, unspecified: Secondary | ICD-10-CM

## 2015-12-03 DIAGNOSIS — R262 Difficulty in walking, not elsewhere classified: Secondary | ICD-10-CM

## 2015-12-03 NOTE — Therapy (Signed)
[place], [place], [address] Phone: [phone]   Fax:  [phone]  Physical Therapy Treatment  Patient Details  Name: Vincent Hyden Jakes Sr. MRN: [id number] Date of Birth: May 15, 1951 Referring Provider: Leeroy Cha, MD  Encounter Date: 12/03/2015      PT End of Session - 12/03/15 0923    Visit Number 5   Number of Visits 13   Date for PT Re-Evaluation 12/03/15   Authorization Type Health team advantage   Authorization Time Period 11/12/15 to 12/24/15   PT Start Time 0856   PT Stop Time 0934   PT Time Calculation (min) 38 min   Activity Tolerance Patient tolerated treatment well;Patient limited by fatigue   Behavior During Therapy [facility] for tasks assessed/performed      Past Medical History  Diagnosis Date  . Diabetes mellitus   . Hypertension   . Shortness of breath   . Pneumonia   . Arthritis   . Enlarged prostate     Past Surgical History  Procedure Laterality Date  . Laceration repair      left hand  . Circumcision    . Hydrocele excision  10/14/2011    Procedure: HYDROCELECTOMY ADULT;  Surgeon: Malka So, MD;  Location: [facility];  Service: Urology;  Laterality: Right;  . Lumbar laminectomy/decompression microdiscectomy Right 10/07/2015    Procedure: Right Lumbar four-five ,lumbar five sacral-one Laminectomy;  Surgeon: Leeroy Cha, MD;  Location: [place] [facility];  Service: Neurosurgery;  Laterality: Right;    There were no vitals filed for this visit.      Subjective Assessment - 12/03/15 0904    Subjective Pt says hes a little stiff this morning. No additional updates, just been staying as active as he can tolerate. He still has not been to the gym in about a week.    Pertinent History HTN, DM, Lx stenosis with lamicetomy/decompression of L4/L5/Sl   Limitations Lifting;Walking   How long can you sit comfortably? unlimited   How long can you stand comfortably? 15 minutes   How long can you  walk comfortably? 10-15 minutes on treadmill    Patient Stated Goals increase RLE strength   Currently in Pain? Yes   Pain Score 3    Pain Location Back   Pain Orientation Lower   Pain Descriptors / Indicators Aching   Pain Type Chronic pain                         [facility] Adult PT Treatment/Exercise - 12/03/15 0001    Ambulation/Gait   Ambulation/Gait Yes   Ambulation/Gait Assistance 7: Independent   Ambulation Distance (Feet) 650 Feet   Assistive device None   Gait Comments 3MWT  Onset of R hip pain at about 447ft   Lumbar Exercises: Stretches   Active Hamstring Stretch 5 reps;20 seconds  hooklying, LAQ, no strap, + ankle circles.    Lumbar Exercises: Supine   Clam 10 reps;Limitations  2x15 blueTB   Bent Knee Raise Other (comment)  eccentric bent knee lowering, alt sides, both up 4x5c cuff   Bridge 10 reps  2 sets x 10 reps with cueing for ab set   Straight Leg Raise 10 reps  2x10   Other Supine Lumbar Exercises pelvic clock in hooklying with Cuff  x20 for proprioceptive education on pelvic tilt   Other Supine Lumbar Exercises heel walking fwd, toe walking retro  5RT in // bars  Modalities   Modalities Moist Heat  Left lateral quads, not part of times treatment.  (35min)                PT Education - 12/03/15 [id number]    Education provided Yes   Education Details explained how hygrometer feedback demonstrate pelvic instability in gait.    Person(s) Educated Patient   Methods Explanation   Comprehension Verbalized understanding          PT Short Term Goals - 11/12/15 2135    PT SHORT TERM GOAL #1   Title Pt to be indep and compliant with updated HEP   Time 2   Period Weeks   Status New   PT SHORT TERM GOAL #2   Title Pt to report max pain of less than 4/10 VAS to improve tolerance with daily activity.   Time 3   Period Weeks   Status New   PT SHORT TERM GOAL #3   Title Pt to verbalize surgical precautions and demonstrate proper log  roll technique consistently during his session and without assistance from therapist   Time 3   Period Weeks   Status New           PT Long Term Goals - 11/12/15 2137    PT LONG TERM GOAL #1   Title Pt to demonstrate pain rating <2/10 VAS for improved tolerance to activity and exercise during his sessions.   Time 6   Period Weeks   Status New   PT LONG TERM GOAL #2   Title Pt to ambulate over even/uneven surfaces >523ft without AD and without LOB/fear of falling to improve his independence at home.   Time 6   Period Weeks   Status New   PT LONG TERM GOAL #3   Title Pt will demonstrate improved BLE strength to atleast 4+/5 throughout for improved stair negotiation.    Time 6   Period Weeks   Status New   PT LONG TERM GOAL #4   Title Pt will demonstrate improved flexilbilty evident by negative thomas test, to improve his posture during walking and other activity.    Time 6   Period Weeks   Status New               Plan - 12/03/15 0926    Clinical Impression Statement Pt responding very well to feedback cuff today for core stabilization, the visual feedback not only providing better feedback, but reported greater soreness/activation in the abdominals. Pt requires heavy verbal and tactile cues for instruction, but performs well once instructed. R lateral quads remain tense and painful, but responde well to heat and manual therapy.    Rehab Potential Good   PT Frequency 2x / week   PT Duration 6 weeks   PT Treatment/Interventions ADLs/Self Care Home Management;Biofeedback;Cryotherapy;Moist Heat;Electrical Stimulation;Therapeutic exercise;Gait training;Stair training;Neuromuscular re-education;Functional mobility training;Therapeutic activities;Patient/family education;Manual techniques;Balance training;Scar mobilization;Passive range of motion   PT Next Visit Plan continue with core strengthening and activation with cuff   PT Home Exercise Plan no changes at this time.     Consulted and Agree with Plan of Care Patient      Patient will benefit from skilled therapeutic intervention in order to improve the following deficits and impairments:  Decreased activity tolerance, Decreased endurance, Abnormal gait, Decreased knowledge of precautions, Decreased strength, Decreased range of motion, Decreased mobility, Increased fascial restricitons, Increased muscle spasms, Pain, Improper body mechanics  Visit Diagnosis: Midline low back pain without sciatica  Muscle weakness (  generalized)  Difficulty in walking, not elsewhere classified  Other symptoms and signs involving the musculoskeletal system     Problem List Patient Active Problem List   Diagnosis Date Noted  . Lumbar stenosis 10/07/2015  . CAP (community acquired pneumonia) 07/31/2014  . Diabetes ([place]) 07/31/2014  . Hypertension 07/31/2014  . Obesity 07/31/2014  . Community acquired pneumonia 07/31/2014    9:38 AM, 12/03/2015 Etta Grandchild, PT, DPT PRN Physical Therapist - [place] License # [id number] [phone] (mobile)   [place], [place], [address] Phone: [phone]   Fax:  [phone]  Name: Vincent Billingsley Zoeller Sr. MRN: [id number] Date of Birth: [birth year]

## 2015-12-08 ENCOUNTER — Ambulatory Visit (HOSPITAL_COMMUNITY): Payer: PPO | Admitting: Physical Therapy

## 2015-12-10 ENCOUNTER — Ambulatory Visit (HOSPITAL_COMMUNITY): Payer: Self-pay | Admitting: Physical Therapy

## 2015-12-15 ENCOUNTER — Ambulatory Visit (HOSPITAL_COMMUNITY): Payer: PPO | Admitting: Physical Therapy

## 2015-12-15 DIAGNOSIS — M6281 Muscle weakness (generalized): Secondary | ICD-10-CM

## 2015-12-15 DIAGNOSIS — R29898 Other symptoms and signs involving the musculoskeletal system: Secondary | ICD-10-CM

## 2015-12-15 DIAGNOSIS — M545 Low back pain, unspecified: Secondary | ICD-10-CM

## 2015-12-15 DIAGNOSIS — R262 Difficulty in walking, not elsewhere classified: Secondary | ICD-10-CM

## 2015-12-15 NOTE — Therapy (Signed)
[place], [place], [address] Phone: [phone]   Fax:  [phone]  Physical Therapy Treatment  Patient Details  Name: Vincent Ismaili Kardell Sr. MRN: [id number] Date of Birth: 04/30/1951 Referring Provider: Leeroy Cha, MD  Encounter Date: 12/15/2015      PT End of Session - 12/15/15 0932    Visit Number 6   Number of Visits 13   Date for PT Re-Evaluation 12/24/15   Authorization Type Health team advantage   Authorization Time Period 11/12/15 to 12/24/15   PT Start Time 0905   PT Stop Time 0944   PT Time Calculation (min) 39 min   Activity Tolerance Patient tolerated treatment well   Behavior During Therapy [facility] for tasks assessed/performed      Past Medical History  Diagnosis Date  . Diabetes mellitus   . Hypertension   . Shortness of breath   . Pneumonia   . Arthritis   . Enlarged prostate     Past Surgical History  Procedure Laterality Date  . Laceration repair      left hand  . Circumcision    . Hydrocele excision  10/14/2011    Procedure: HYDROCELECTOMY ADULT;  Surgeon: Malka So, MD;  Location: [facility];  Service: Urology;  Laterality: Right;  . Lumbar laminectomy/decompression microdiscectomy Right 10/07/2015    Procedure: Right Lumbar four-five ,lumbar five sacral-one Laminectomy;  Surgeon: Leeroy Cha, MD;  Location: [place] [facility];  Service: Neurosurgery;  Laterality: Right;    There were no vitals filed for this visit.      Subjective Assessment - 12/15/15 0908    Subjective Pt states he is doing good. Has been doing his HEP 2-3x/ day. He is trying to walk alot at home, but notes he can only stand and sit for so long. He can stand in one spot for about 10-89min before he has to sit down due to pain.    Pertinent History HTN, DM, Lx stenosis with lamicetomy/decompression of L4/L5/Sl   Patient Stated Goals increase RLE strength   Currently in Pain? Yes   Pain Score 4    Pain Location Back    Pain Orientation Lower   Pain Descriptors / Indicators Aching  itching   Pain Type Chronic pain   Pain Radiating Towards none   Pain Onset More than a month ago   Pain Frequency Intermittent   Aggravating Factors  standing    Pain Relieving Factors extension                         [facility] Adult PT Treatment/Exercise - 12/15/15 0001    Exercises   Exercises Other Exercises   Other Exercises  B hip flexor stretch 3x30 sec in supine; hip adductor stretch BLE 3x10 sec.    Lumbar Exercises: Standing   Other Standing Lumbar Exercises squats in // bars x15 reps   Lumbar Exercises: Supine   Heel Slides 20 reps   Heel Slides Limitations verbal/tactile cues    Bent Knee Raise 20 reps   Bent Knee Raise Limitations verbal/tactile cues for TrA activation   Bridge 15 reps  x2 sets   Manual Therapy   Manual Therapy Passive ROM   Manual therapy comments performed separately from all other inteventions   Passive ROM hamstring, pirifromis stretch x5 each LE.                 PT Education - 12/15/15 0930  Education provided Yes   Education Details importance of HEP adherence; technique with exercises/stretches   Person(s) Educated Patient   Methods Explanation;Demonstration   Comprehension Verbalized understanding;Verbal cues required;Need further instruction          PT Short Term Goals - 11/12/15 2135    PT SHORT TERM GOAL #1   Title Pt to be indep and compliant with updated HEP   Time 2   Period Weeks   Status New   PT SHORT TERM GOAL #2   Title Pt to report max pain of less than 4/10 VAS to improve tolerance with daily activity.   Time 3   Period Weeks   Status New   PT SHORT TERM GOAL #3   Title Pt to verbalize surgical precautions and demonstrate proper log roll technique consistently during his session and without assistance from therapist   Time 3   Period Weeks   Status New           PT Long Term Goals - 11/12/15 2137    PT LONG TERM  GOAL #1   Title Pt to demonstrate pain rating <2/10 VAS for improved tolerance to activity and exercise during his sessions.   Time 6   Period Weeks   Status New   PT LONG TERM GOAL #2   Title Pt to ambulate over even/uneven surfaces >567ft without AD and without LOB/fear of falling to improve his independence at home.   Time 6   Period Weeks   Status New   PT LONG TERM GOAL #3   Title Pt will demonstrate improved BLE strength to atleast 4+/5 throughout for improved stair negotiation.    Time 6   Period Weeks   Status New   PT LONG TERM GOAL #4   Title Pt will demonstrate improved flexilbilty evident by negative thomas test, to improve his posture during walking and other activity.    Time 6   Period Weeks   Status New               Plan - 12/15/15 0945    Clinical Impression Statement Pt demonstrating improved TrA activation today with fewer verbal/tactile cues required to correctly perform therex. Noting decreased endurance of deep abdominal musculature evident by pt reported fatigue and increased difficulty maintaining activation. Discussed importance of HEP adherence to maintain abdominal strength and improve endurance, with pt reporting understanding. Will continue with current POC.   Rehab Potential Good   PT Frequency 2x / week   PT Duration 6 weeks   PT Treatment/Interventions ADLs/Self Care Home Management;Biofeedback;Cryotherapy;Moist Heat;Electrical Stimulation;Therapeutic exercise;Gait training;Stair training;Neuromuscular re-education;Functional mobility training;Therapeutic activities;Patient/family education;Manual techniques;Balance training;Scar mobilization;Passive range of motion   PT Next Visit Plan continue with core strengthening and activation with cuff   PT Home Exercise Plan updated with piriformis stretch, abdominal bracing with alt heel slide and bent knee raise.   Consulted and Agree with Plan of Care Patient      Patient will benefit from skilled  therapeutic intervention in order to improve the following deficits and impairments:  Decreased activity tolerance, Decreased endurance, Abnormal gait, Decreased knowledge of precautions, Decreased strength, Decreased range of motion, Decreased mobility, Increased fascial restricitons, Increased muscle spasms, Pain, Improper body mechanics  Visit Diagnosis: Midline low back pain without sciatica  Muscle weakness (generalized)  Difficulty in walking, not elsewhere classified  Other symptoms and signs involving the musculoskeletal system     Problem List Patient Active Problem List   Diagnosis Date Noted  .  Lumbar stenosis 10/07/2015  . CAP (community acquired pneumonia) 07/31/2014  . Diabetes ([place]) 07/31/2014  . Hypertension 07/31/2014  . Obesity 07/31/2014  . Community acquired pneumonia 07/31/2014    9:51 AM,12/15/2015 Elly Modena PT, DPT [facility] [place] [address] [place], [place], [address] Phone: [phone]   Fax:  [phone]  Name: Randi Mehlhorn Heilman Sr. MRN: [id number] Date of Birth: 02-17-1951

## 2015-12-17 ENCOUNTER — Ambulatory Visit (HOSPITAL_COMMUNITY): Payer: PPO | Admitting: Physical Therapy

## 2015-12-17 DIAGNOSIS — M6281 Muscle weakness (generalized): Secondary | ICD-10-CM

## 2015-12-17 DIAGNOSIS — R262 Difficulty in walking, not elsewhere classified: Secondary | ICD-10-CM

## 2015-12-17 DIAGNOSIS — M545 Low back pain, unspecified: Secondary | ICD-10-CM

## 2015-12-17 DIAGNOSIS — R29898 Other symptoms and signs involving the musculoskeletal system: Secondary | ICD-10-CM

## 2015-12-17 NOTE — Therapy (Signed)
[place], [place], [address] Phone: [phone]   Fax:  [phone]  Physical Therapy Treatment  Patient Details  Name: Vincent Orosco Savitz Sr. MRN: [id number] Date of Birth: 11/06/1950 Referring Provider: Leeroy Cha, MD  Encounter Date: 12/17/2015      PT End of Session - 12/17/15 0944    Visit Number 7   Number of Visits 13   Date for PT Re-Evaluation 12/24/15   Authorization Type [employer]   Authorization Time Period 11/12/15 to 12/24/15   PT Start Time 0907   PT Stop Time 0945   PT Time Calculation (min) 38 min   Activity Tolerance Patient tolerated treatment well   Behavior During Therapy Vincent Ramos for tasks assessed/performed      Past Medical History  Diagnosis Date  . Diabetes mellitus   . Hypertension   . Shortness of breath   . Pneumonia   . Arthritis   . Enlarged prostate     Past Surgical History  Procedure Laterality Date  . Laceration repair      left hand  . Circumcision    . Hydrocele excision  10/14/2011    Procedure: HYDROCELECTOMY ADULT;  Surgeon: Malka So, MD;  Location: [facility];  Service: Urology;  Laterality: Right;  . Lumbar laminectomy/decompression microdiscectomy Right 10/07/2015    Procedure: Right Lumbar four-five ,lumbar five sacral-one Laminectomy;  Surgeon: Leeroy Cha, MD;  Location: [facility];  Service: Neurosurgery;  Laterality: Right;    There were no vitals filed for this visit.      Subjective Assessment - 12/17/15 0909    Subjective Pt states he is doing good. Reports pain and stiffness along both sides of his low back. States he went to [employer] and was able to walk for 5-10 minutes before he had  to sit and take a break. He had to sit for a minute or so before he was able to get back up.    Pertinent History HTN, DM, Lx stenosis with lamicetomy/decompression of L4/L5/Sl   Patient Stated Goals increase RLE strength   Currently in Pain? Yes   Pain Score 4    Pain Location Back   Pain Orientation Lower   Pain Descriptors / Indicators Aching   Pain Type Chronic pain                         [facility] Adult PT Treatment/Exercise - 12/17/15 0001    Exercises   Other Exercises  B hip flexor stretch 3x30 sec in supine   Lumbar Exercises: Supine   Heel Slides 20 reps   Heel Slides Limitations verbal cues   Other Supine Lumbar Exercises Bent knee lower trunk rotation x20   Other Supine Lumbar Exercises Bent knee fallout x10 each   Manual Therapy   Manual Therapy Soft tissue mobilization   Manual therapy comments performed separately from all other inteventions   Soft tissue mobilization STM lumbar paraspinals, Scar massage                 PT Education - 12/17/15 0942    Education provided Yes   Education Details Continued HEP adherence; updcoming reassessment; encouraged pt to walk daily; implications of manual treatment   Person(s) Educated Patient   Methods Explanation;Demonstration   Comprehension Verbalized understanding;Returned demonstration;Need further instruction          PT Short Term Goals - 11/12/15 2135    PT  SHORT TERM GOAL #1   Title Pt to be indep and compliant with updated HEP   Time 2   Period Weeks   Status New   PT SHORT TERM GOAL #2   Title Pt to report max pain of less than 4/10 VAS to improve tolerance with daily activity.   Time 3   Period Weeks   Status New   PT SHORT TERM GOAL #3   Title Pt to verbalize surgical precautions and demonstrate proper log roll technique consistently during his session and without assistance from therapist   Time 3   Period Weeks   Status New           PT Long Term Goals - 11/12/15 2137    PT LONG TERM GOAL #1   Title Pt to demonstrate pain rating <2/10 VAS for improved tolerance to activity and exercise during his sessions.   Time 6   Period Weeks   Status New   PT LONG TERM GOAL #2   Title Pt to ambulate over even/uneven  surfaces >556ft without AD and without LOB/fear of falling to improve his independence at home.   Time 6   Period Weeks   Status New   PT LONG TERM GOAL #3   Title Pt will demonstrate improved BLE strength to atleast 4+/5 throughout for improved stair negotiation.    Time 6   Period Weeks   Status New   PT LONG TERM GOAL #4   Title Pt will demonstrate improved flexilbilty evident by negative thomas test, to improve his posture during walking and other activity.    Time 6   Period Weeks   Status New               Plan - 12/17/15 0944    Clinical Impression Statement Today's session continued to focus on TrA activation with pt needing verbal/tactile cues to correctly activate musculature. Also performed manual treatment of STM to surgical scar and paraspinals with improved pain/relaxation post treatment. Pt is currently trying to stay active and therapist reviewed importance of HEP adherence daily with correct technique for improved abdominal strength/endurance. Will continue with current POC.    Rehab Potential Good   PT Frequency 2x / week   PT Duration 6 weeks   PT Treatment/Interventions ADLs/Self Care Home Management;Biofeedback;Cryotherapy;Moist Heat;Electrical Stimulation;Therapeutic exercise;Gait training;Stair training;Neuromuscular re-education;Functional mobility training;Therapeutic activities;Patient/family education;Manual techniques;Balance training;Scar mobilization;Passive range of motion   PT Next Visit Plan continue with core strengthening and activation with cuff   PT Home Exercise Plan updated with piriformis stretch, abdominal bracing with alt heel slide and bent knee raise.   Consulted and Agree with Plan of Care Patient      Patient will benefit from skilled therapeutic intervention in order to improve the following deficits and impairments:  Decreased activity tolerance, Decreased endurance, Abnormal gait, Decreased knowledge of precautions, Decreased  strength, Decreased range of motion, Decreased mobility, Increased fascial restricitons, Increased muscle spasms, Pain, Improper body mechanics  Visit Diagnosis: Midline low back pain without sciatica  Muscle weakness (generalized)  Difficulty in walking, not elsewhere classified  Other symptoms and signs involving the musculoskeletal system     Problem List Patient Active Problem List   Diagnosis Date Noted  . Lumbar stenosis 10/07/2015  . CAP (community acquired pneumonia) 07/31/2014  . Diabetes ([place]) 07/31/2014  . Hypertension 07/31/2014  . Obesity 07/31/2014  . Community acquired pneumonia 07/31/2014   9:55 AM,12/17/2015 Vincent Ramos PT, [facility] [phone]  [place], [place], [address] Phone: [phone]   Fax:  [phone]  Name: Vincent Olivares Treanor Sr. MRN: [id number] Date of Birth: 1951-05-07

## 2015-12-22 ENCOUNTER — Ambulatory Visit (HOSPITAL_COMMUNITY): Payer: PPO | Admitting: Physical Therapy

## 2015-12-22 DIAGNOSIS — M6281 Muscle weakness (generalized): Secondary | ICD-10-CM

## 2015-12-22 DIAGNOSIS — R262 Difficulty in walking, not elsewhere classified: Secondary | ICD-10-CM

## 2015-12-22 DIAGNOSIS — M545 Low back pain, unspecified: Secondary | ICD-10-CM

## 2015-12-22 DIAGNOSIS — R29898 Other symptoms and signs involving the musculoskeletal system: Secondary | ICD-10-CM

## 2015-12-22 NOTE — Therapy (Signed)
[place], [place], [address] Phone: [phone]   Fax:  [phone]  Physical Therapy Treatment  Patient Details  Name: Vincent Bembenek Alfonzo Sr. MRN: [id number] Date of Birth: 06-23-1951 Referring Provider: Leeroy Cha, MD  Encounter Date: 12/22/2015      PT End of Session - 12/22/15 0941    Visit Number 8   Number of Visits 13   Date for PT Re-Evaluation 12/24/15   Authorization Type [employer]   Authorization Time Period 11/12/15 to 12/24/15   PT Start Time 0902   PT Stop Time 0941   PT Time Calculation (min) 39 min   Activity Tolerance Patient tolerated treatment well   Behavior During Therapy [facility] for tasks assessed/performed      Past Medical History  Diagnosis Date  . Diabetes mellitus   . Hypertension   . Shortness of breath   . Pneumonia   . Arthritis   . Enlarged prostate     Past Surgical History  Procedure Laterality Date  . Laceration repair      left hand  . Circumcision    . Hydrocele excision  10/14/2011    Procedure: HYDROCELECTOMY ADULT;  Surgeon: Malka So, MD;  Location: [facility];  Service: Urology;  Laterality: Right;  . Lumbar laminectomy/decompression microdiscectomy Right 10/07/2015    Procedure: Right Lumbar four-five ,lumbar five sacral-one Laminectomy;  Surgeon: Leeroy Cha, MD;  Location: [facility];  Service: Neurosurgery;  Laterality: Right;    There were no vitals filed for this visit.      Subjective Assessment - 12/22/15 0905    Subjective Pt reports his shoulders are stiff this morning. He feels his back is about the same today. He notes that the other day his back started aching and just stopped on its own. He continues to be limited with his walking.    Pertinent History HTN, DM, Lx stenosis with lamicetomy/decompression of L4/L5/Sl   Patient Stated Goals increase RLE strength   Currently in Pain? Yes   Pain Score 3    Pain Location Back   Pain  Orientation Lower   Pain Descriptors / Indicators Aching   Pain Type Chronic pain   Pain Radiating Towards none   Pain Onset More than a month ago   Pain Frequency Intermittent   Aggravating Factors  standing/walking   Pain Relieving Factors extension, sitting some   Effect of Pain on Daily Activities limited walking in stores   Multiple Pain Sites No                         [facility] Adult PT Treatment/Exercise - 12/22/15 0001    Lumbar Exercises: Supine   Other Supine Lumbar Exercises bent knee isometrics with feedback cuff, 5x5 sec , 2 sets each.    Other Supine Lumbar Exercises never glides LLE 2x10 reps   Lumbar Exercises: Prone   Other Prone Lumbar Exercises prone press ups x20 reps   Lumbar Exercises: Quadruped   [place] 20 reps   Single Arm Raise Left;Right;15 reps   Straight Leg Raise 20 reps;Limitations  increased trunk rotation noted                PT Education - 12/22/15 0938    Education provided Yes   Education Details importance of performing HEP regularly at home; discussed POC and reassessment next visit; encouraged walking in the pool   Person(s) Educated Patient  Methods Explanation;Demonstration   Comprehension Verbalized understanding;Returned demonstration          PT Short Term Goals - 11/12/15 2135    PT SHORT TERM GOAL #1   Title Pt to be indep and compliant with updated HEP   Time 2   Period Weeks   Status New   PT SHORT TERM GOAL #2   Title Pt to report max pain of less than 4/10 VAS to improve tolerance with daily activity.   Time 3   Period Weeks   Status New   PT SHORT TERM GOAL #3   Title Pt to verbalize surgical precautions and demonstrate proper log roll technique consistently during his session and without assistance from therapist   Time 3   Period Weeks   Status New           PT Long Term Goals - 11/12/15 2137    PT LONG TERM GOAL #1   Title Pt to demonstrate pain rating <2/10 VAS for  improved tolerance to activity and exercise during his sessions.   Time 6   Period Weeks   Status New   PT LONG TERM GOAL #2   Title Pt to ambulate over even/uneven surfaces >5109ft without AD and without LOB/fear of falling to improve his independence at home.   Time 6   Period Weeks   Status New   PT LONG TERM GOAL #3   Title Pt will demonstrate improved BLE strength to atleast 4+/5 throughout for improved stair negotiation.    Time 6   Period Weeks   Status New   PT LONG TERM GOAL #4   Title Pt will demonstrate improved flexilbilty evident by negative thomas test, to improve his posture during walking and other activity.    Time 6   Period Weeks   Status New               Plan - 12/22/15 [id number]    Clinical Impression Statement Pt demonstrates improved TrA activation this session with biofeedback cuff. He is making progress towards his goals evident by improved pain and strength, however is biggest limitation is pain with walking for long periods of time. I discussed reassessment next visit and reviewed HEP adherence as well as the importance of continued adherence to maintain trunk endurance and strength.   Rehab Potential Good   PT Frequency 2x / week   PT Duration 6 weeks   PT Treatment/Interventions ADLs/Self Care Home Management;Biofeedback;Cryotherapy;Moist Heat;Electrical Stimulation;Therapeutic exercise;Gait training;Stair training;Neuromuscular re-education;Functional mobility training;Therapeutic activities;Patient/family education;Manual techniques;Balance training;Scar mobilization;Passive range of motion   PT Next Visit Plan continue with core strengthening and activation with cuff   PT Home Exercise Plan updated with piriformis stretch, abdominal bracing with alt heel slide and bent knee raise.   Consulted and Agree with Plan of Care Patient      Patient will benefit from skilled therapeutic intervention in order to improve the following deficits and impairments:   Decreased activity tolerance, Decreased endurance, Abnormal gait, Decreased knowledge of precautions, Decreased strength, Decreased range of motion, Decreased mobility, Increased fascial restricitons, Increased muscle spasms, Pain, Improper body mechanics  Visit Diagnosis: Midline low back pain without sciatica  Muscle weakness (generalized)  Difficulty in walking, not elsewhere classified  Other symptoms and signs involving the musculoskeletal system     Problem List Patient Active Problem List   Diagnosis Date Noted  . Lumbar stenosis 10/07/2015  . CAP (community acquired pneumonia) 07/31/2014  . Diabetes ([place]) 07/31/2014  . Hypertension  07/31/2014  . Obesity 07/31/2014  . Community acquired pneumonia 07/31/2014   12:44 PM,12/22/2015 Elly Modena PT, DPT [facility] [place] [address] [place], [place], [address] Phone: [phone]   Fax:  [phone]  Name: Vincent Seckinger Dinan Sr. MRN: [id number] Date of Birth: Apr 19, 1951

## 2015-12-23 DIAGNOSIS — M5489 Other dorsalgia: Secondary | ICD-10-CM | POA: Diagnosis not present

## 2015-12-23 DIAGNOSIS — I10 Essential (primary) hypertension: Secondary | ICD-10-CM | POA: Diagnosis not present

## 2015-12-23 DIAGNOSIS — E1165 Type 2 diabetes mellitus with hyperglycemia: Secondary | ICD-10-CM | POA: Diagnosis not present

## 2015-12-23 DIAGNOSIS — Z00111 Health examination for newborn 8 to 28 days old: Secondary | ICD-10-CM | POA: Diagnosis not present

## 2015-12-23 DIAGNOSIS — Z0000 Encounter for general adult medical examination without abnormal findings: Secondary | ICD-10-CM | POA: Diagnosis not present

## 2015-12-24 ENCOUNTER — Encounter (HOSPITAL_COMMUNITY): Payer: Self-pay | Admitting: Physical Therapy

## 2015-12-24 ENCOUNTER — Ambulatory Visit (HOSPITAL_COMMUNITY): Payer: PPO | Admitting: Physical Therapy

## 2015-12-24 DIAGNOSIS — R262 Difficulty in walking, not elsewhere classified: Secondary | ICD-10-CM

## 2015-12-24 DIAGNOSIS — M6281 Muscle weakness (generalized): Secondary | ICD-10-CM

## 2015-12-24 DIAGNOSIS — M545 Low back pain, unspecified: Secondary | ICD-10-CM

## 2015-12-24 DIAGNOSIS — R29898 Other symptoms and signs involving the musculoskeletal system: Secondary | ICD-10-CM

## 2015-12-24 NOTE — Patient Instructions (Signed)
ABDOMINAL BRACING  While lying on your back, tighten your stomach muscles as you draw your navel down towards the floor.   x15 reps, hold 5 sec.       Abdominal bracing with heel taps  Start with both legs up (90/90 position). Lower one heel toward mat then bring back to start. Keep you back flat on mat table throughout leg movement. Re-stabilize pelvis. Repeat with other leg. Perform at a controlled speed.   x15 times       BRIDGING  While lying on your back, tighten your lower abdominals, squeeze your buttocks and then raise your buttocks off the floor/bed as creating a "Bridge" with your body. Hold and then lower yourself and repeat 15x. do 2 sets.    EXERCISE BALL - [place] Start in a seated position on the ball. Next, slowly walk your feet forward so that the ball is on your upper back. Keep your buttocks and pelvis up off the ball and straight with your thighs.  2x10      EXERCISE BALL - WALL SQUATS  Start by standing up and leaning your low back up against an exercise ball on a wall. Your feet should be spread apart about shoulder width apart.  Next, slowly bend your knees and lower your buttocks towards the floor. Knees should bend in line with the 2nd toe and not pass the front of the foot. 2x15    SQUATS - MINI SQUAT - TABLE  While standing with feet shoulder width apart and in front of a stable support for balance assist if needed, bend your knees and lower your body towards the floor. Your body weight should mostly be directed through the heels of your feet. Return to a standing position. Knees should bend in line with the 2nd toe and not pass the front of the foot. 2x10     HIP FLEXOR STRETCH 3  While lying on a table or high bed, let the affected leg lower towards the floor until a stretch is felt along the front of your thigh.   At the same time, slowly bend your affected knee to add more stretch.  hold 30 sec, repeat 3x      Seated Piriformis  Stretch  as shown  hold 30sec, repeat 3x

## 2015-12-24 NOTE — Therapy (Signed)
[place], [place], [address] Phone: [phone]   Fax:  [phone]  Physical Therapy Treatment/Discharge  Patient Details  Name: Vincent Schrack Desaulniers Sr. MRN: [id number] Date of Birth: [birth year] Referring Provider: Leeroy Cha, MD  Encounter Date: 12/24/2015      PT End of Session - 12/24/15 0943    Visit Number 9   Number of Visits 13   Date for PT Re-Evaluation 12/24/15   Authorization Type Health team advantage   Authorization Time Period 11/12/15 to 12/24/15   PT Start Time 0901   PT Stop Time 0940   PT Time Calculation (min) 39 min   Activity Tolerance Patient tolerated treatment well   Behavior During Therapy [facility] for tasks assessed/performed      Past Medical History  Diagnosis Date  . Diabetes mellitus   . Hypertension   . Shortness of breath   . Pneumonia   . Arthritis   . Enlarged prostate     Past Surgical History  Procedure Laterality Date  . Laceration repair      left hand  . Circumcision    . Hydrocele excision  10/14/2011    Procedure: HYDROCELECTOMY ADULT;  Surgeon: Malka So, MD;  Location: [facility];  Service: Urology;  Laterality: Right;  . Lumbar laminectomy/decompression microdiscectomy Right 10/07/2015    Procedure: Right Lumbar four-five ,lumbar five sacral-one Laminectomy;  Surgeon: Leeroy Cha, MD;  Location: [facility];  Service: Neurosurgery;  Laterality: Right;    There were no vitals filed for this visit.      Subjective Assessment - 12/24/15 0906    Subjective Pt reports his shoulders are stiff again this morning and some in his back. He did his HEP the past 2 days since it was raining. Overall he feels he has made improvement since beginning therapy.   Pertinent History HTN, DM, Lx stenosis with lamicetomy/decompression of L4/L5/Sl   How long can you sit comfortably? unlimited   How long can you stand comfortably? 10 minutes   How long can you walk comfortably?  10-15 minutes   Patient Stated Goals increase RLE strength   Currently in Pain? No/denies   Pain Onset More than a month ago            [facility] PT Assessment - 12/24/15 0001    Assessment   Medical Diagnosis Spinal stenosis s/p laminectomy/decompression L4-S1   Referring Provider Leeroy Cha, MD   Onset Date/Surgical Date 10/07/15   Next MD Visit 3 more months    Prior Therapy none   Precautions   Precautions Back   Precaution Comments --   Balance Screen   Has the patient fallen in the past 6 months No   Has the patient had a decrease in activity level because of a fear of falling?  No   Is the patient reluctant to leave their home because of a fear of falling?  No   Home Environment   Living Environment Private residence   Living Arrangements Spouse/significant other   Home Access Stairs to enter   Prior Function   Level of Independence Independent   Vocation On disability   Leisure walking, watch drag racing/cars   Observation/Other Assessments   Observations incision intact and healing well   Sensation   Light Touch Appears Intact   Posture/Postural Control   Posture/Postural Control No significant limitations   Postural Limitations --   ROM / Strength   AROM / PROM /  Strength AROM   AROM   Overall AROM  --  lumbar ROM deferred secondary to post op precautions   AROM Assessment Site Lumbar   Lumbar Flexion mid shin   Lumbar Extension WNL   Lumbar - Right Side Bend jt line   Lumbar - Left Side Bend jt line   Strength   Right Hip Flexion 5/5   Right Hip Extension 4+/5   Right Hip ABduction 5/5   Left Hip Flexion 5/5   Left Hip Extension 4/5   Left Hip ABduction 5/5   Right Knee Flexion 5/5   Right Knee Extension 5/5   Right Ankle Dorsiflexion 5/5   Left Ankle Dorsiflexion 5/5   Flexibility   Soft Tissue Assessment /Muscle Length yes   Hamstrings L/R: 30/30   Quadriceps (+) thomas for hip flexor tightness   Piriformis 25% limited B   Palpation    Palpation comment --   Transfers   Five time sit to stand comments  12 sec without UE   Ambulation/Gait   Ambulation/Gait --   Ambulation/Gait Assistance --   Ambulation Distance (Feet) --   Assistive device --   Gait Pattern --   Gait Comments 3MWT: 723f                     [facility] Adult PT Treatment/Exercise - 12/24/15 0001    Lumbar Exercises: Stretches   Piriformis Stretch 3 reps;30 seconds   Piriformis Stretch Limitations seated    Lumbar Exercises: Standing   Other Standing Lumbar Exercises squats with ball against wall  x10 reps   Lumbar Exercises: Supine   Bridge 5 reps  on physioball                PT Education - 12/24/15 0940    Education provided Yes   Education Details Importance of continuing with HEP even though pt is being discharged; final HEP discussion and review; discussed exercise [facility] Educated Patient   Methods Explanation;Demonstration;Handout   Comprehension Verbalized understanding;Returned demonstration          PT Short Term Goals - 12/24/15 0918    PT SHORT TERM GOAL #1   Title Pt to be indep and compliant with updated HEP   Time 2   Period Weeks   Status Achieved   PT SHORT TERM GOAL #2   Title Pt to report max pain of less than 4/10 VAS to improve tolerance with daily activity.   Baseline 2-3/10 max   Time 3   Period Weeks   Status Achieved   PT SHORT TERM GOAL #3   Title Pt to verbalize surgical precautions and demonstrate proper log roll technique consistently during his session and without assistance from therapist   Time 3   Period Weeks   Status Achieved           PT Long Term Goals - 12/24/15 0919    PT LONG TERM GOAL #1   Title Pt to demonstrate pain rating <2/10 VAS for improved tolerance to activity and exercise during his sessions.   Time 6   Period Weeks   Status Not Met   PT LONG TERM GOAL #2   Title Pt to ambulate over even/uneven surfaces >50109fwithout  AD and without LOB/fear of falling to improve his independence at home.   Baseline [id number] 3 min without AD   Time 6   Period Weeks   Status Achieved  PT LONG TERM GOAL #3   Title Pt will demonstrate improved BLE strength to atleast 4+/5 throughout for improved stair negotiation.    Time 6   Period Weeks   Status Achieved   PT LONG TERM GOAL #4   Title Pt will demonstrate improved flexilbilty evident by negative thomas test, to improve his posture during walking and other activity.    Time 6   Period Weeks   Status Not Met               Plan - January 03, 2016 0943    Clinical Impression Statement Pt was reassessed this session having met most of his goals. He demonstrates overall improved BLE strength, flexibility and mobility. He demonstrates improved posture throughout a majority of his sessions and is able to correctly perform log roll technique without cues from the therapist. He is performing his HEP ~75% of the time and is able to return correct demonstration of his exercises. Although he continues to have pain max 3/10 during walking and standing on concrete and he continues to demonstrate limited hip flexibility, he feels he is ready for discharge at this time to continue with exercise at home and the gym. Therapist discussed advanced HEP and provided several exercises to work on at the gym instead of using the abdominal strengthening machines. Discussed importance of consistent HEP adherence to maintain and increased abdominal strength with the pt voicing understanding at this time. He is being discharged from PT due to meeting most of his short and long term goals and with independence performing his HEP.     Rehab Potential Good   PT Frequency 2x / week   PT Duration 6 weeks   PT Treatment/Interventions ADLs/Self Care Home Management;Biofeedback;Cryotherapy;Moist Heat;Electrical Stimulation;Therapeutic exercise;Gait training;Stair training;Neuromuscular re-education;Functional  mobility training;Therapeutic activities;Patient/family education;Manual techniques;Balance training;Scar mobilization;Passive range of motion   PT Next Visit Plan continue with core strengthening and activation with cuff   PT Home Exercise Plan final HEP, see pt instructions   Recommended Other Services none   Consulted and Agree with Plan of Care Patient      Patient will benefit from skilled therapeutic intervention in order to improve the following deficits and impairments:  Decreased activity tolerance, Decreased endurance, Abnormal gait, Decreased knowledge of precautions, Decreased strength, Decreased range of motion, Decreased mobility, Increased fascial restricitons, Increased muscle spasms, Pain, Improper body mechanics  Visit Diagnosis: Midline low back pain without sciatica  Muscle weakness (generalized)  Difficulty in walking, not elsewhere classified  Other symptoms and signs involving the musculoskeletal system       G-Codes - January 03, 2016 0950    Functional Assessment Tool Used clinical judgement based on assessment of ROM, strength, and mobility   Functional Limitation Mobility: Walking and moving around   Mobility: Walking and Moving Around Current Status [phone]) At least 40 percent but less than 60 percent impaired, limited or restricted   Mobility: Walking and Moving Around Goal Status [phone]) At least 20 percent but less than 40 percent impaired, limited or restricted   Mobility: Walking and Moving Around Discharge Status [phone]) At least 20 percent but less than 40 percent impaired, limited or restricted      Problem List Patient Active Problem List   Diagnosis Date Noted  . Lumbar stenosis 10/07/2015  . CAP (community acquired pneumonia) 07/31/2014  . Diabetes ([place]) 07/31/2014  . Hypertension 07/31/2014  . Obesity 07/31/2014  . Community acquired pneumonia 07/31/2014   PHYSICAL THERAPY DISCHARGE SUMMARY  Visits from Start of Care:  9 Current functional  level related to goals / functional outcomes: Strength atleast 4/5 throughout BLE, Improved ROM, improved mobility and independence with HEP   Remaining deficits: (+) thomas test for B hip flexor tightness, tight piriformis Rt>Lt   Education / Equipment: Advanced home management program Plan: Patient agrees to discharge.  Patient goals were partially met. Patient is being discharged due to                                                     ?????       9:54 AM,12/24/2015 Elly Modena PT, DPT [facility] [place], [place], [address] Phone: [phone]   Fax:  [phone]  Name: Vincent Leach Ivins Sr. MRN: [id number] Date of Birth: 29-Apr-1951

## 2016-01-04 IMAGING — DX DG CHEST 2V
2 series · 2 of 2 positions shown · non-contrast
Comparison: 08/03/2014.

CLINICAL DATA: Cough for 1 week.

EXAM:
CHEST  2 VIEW

[chest pa]
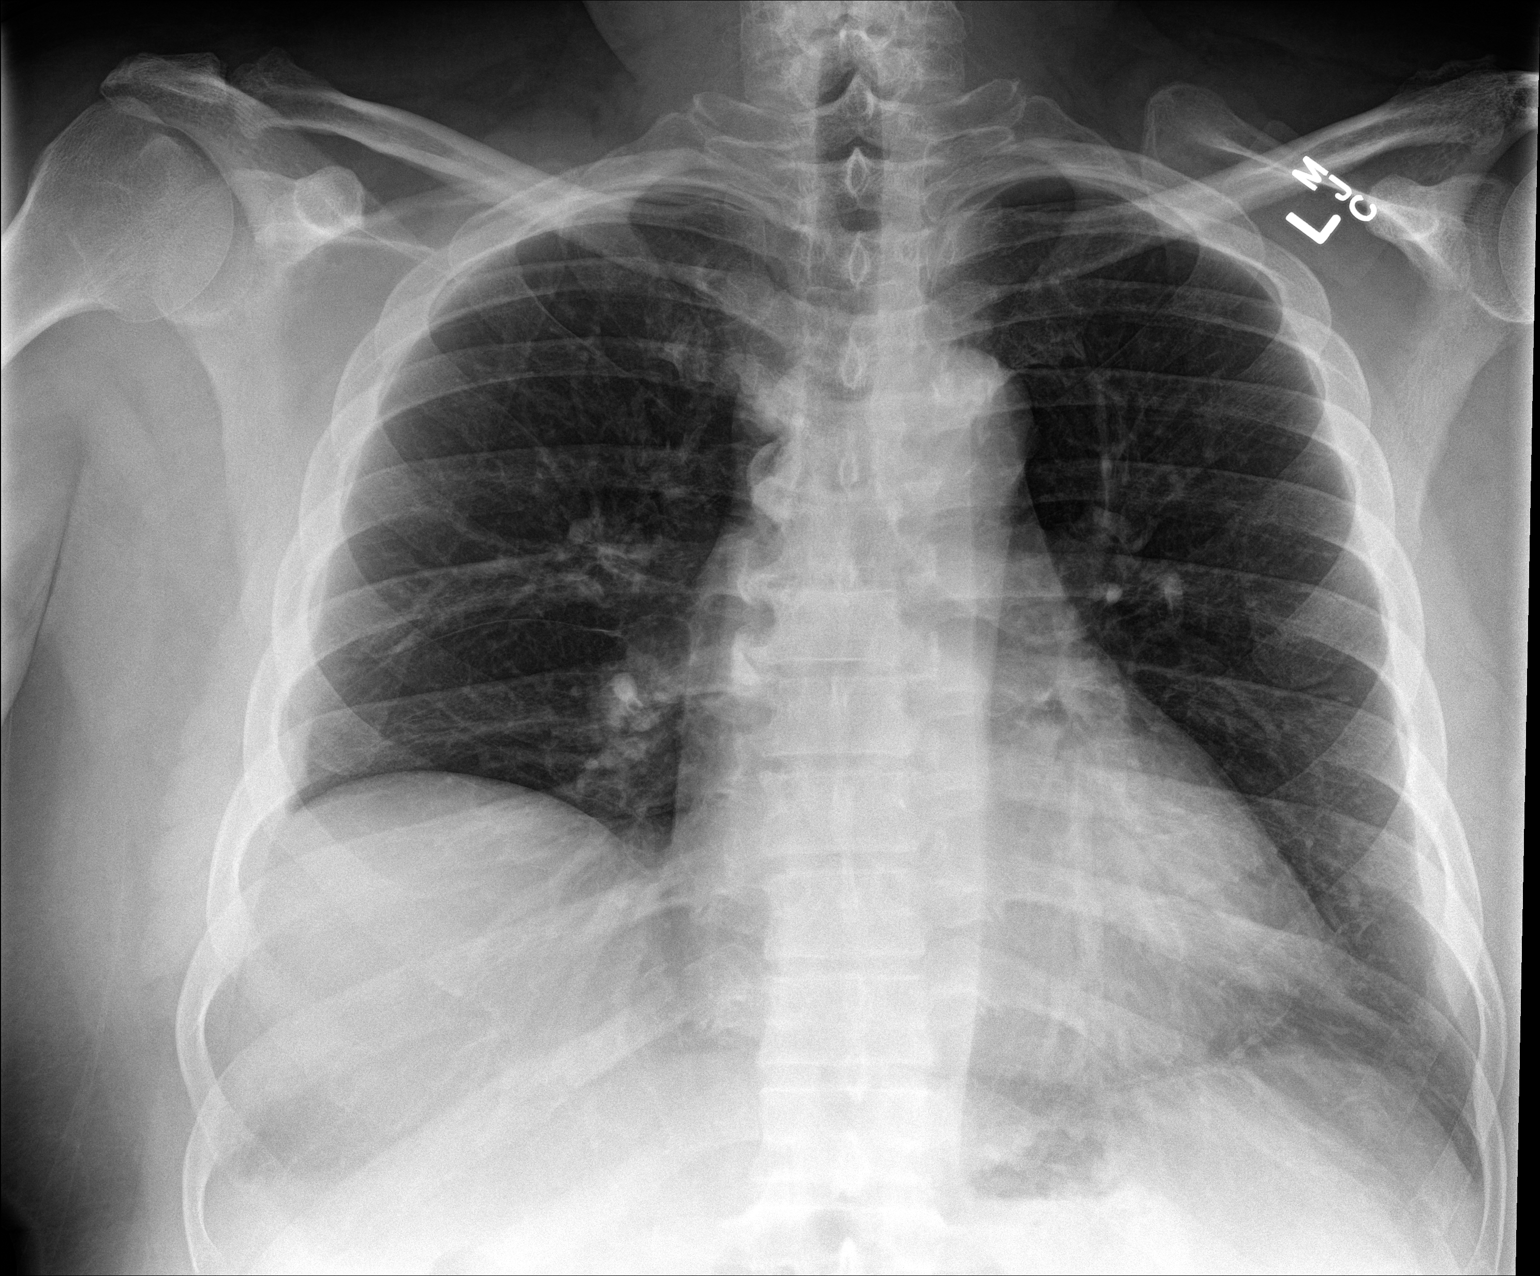

[chest lat]
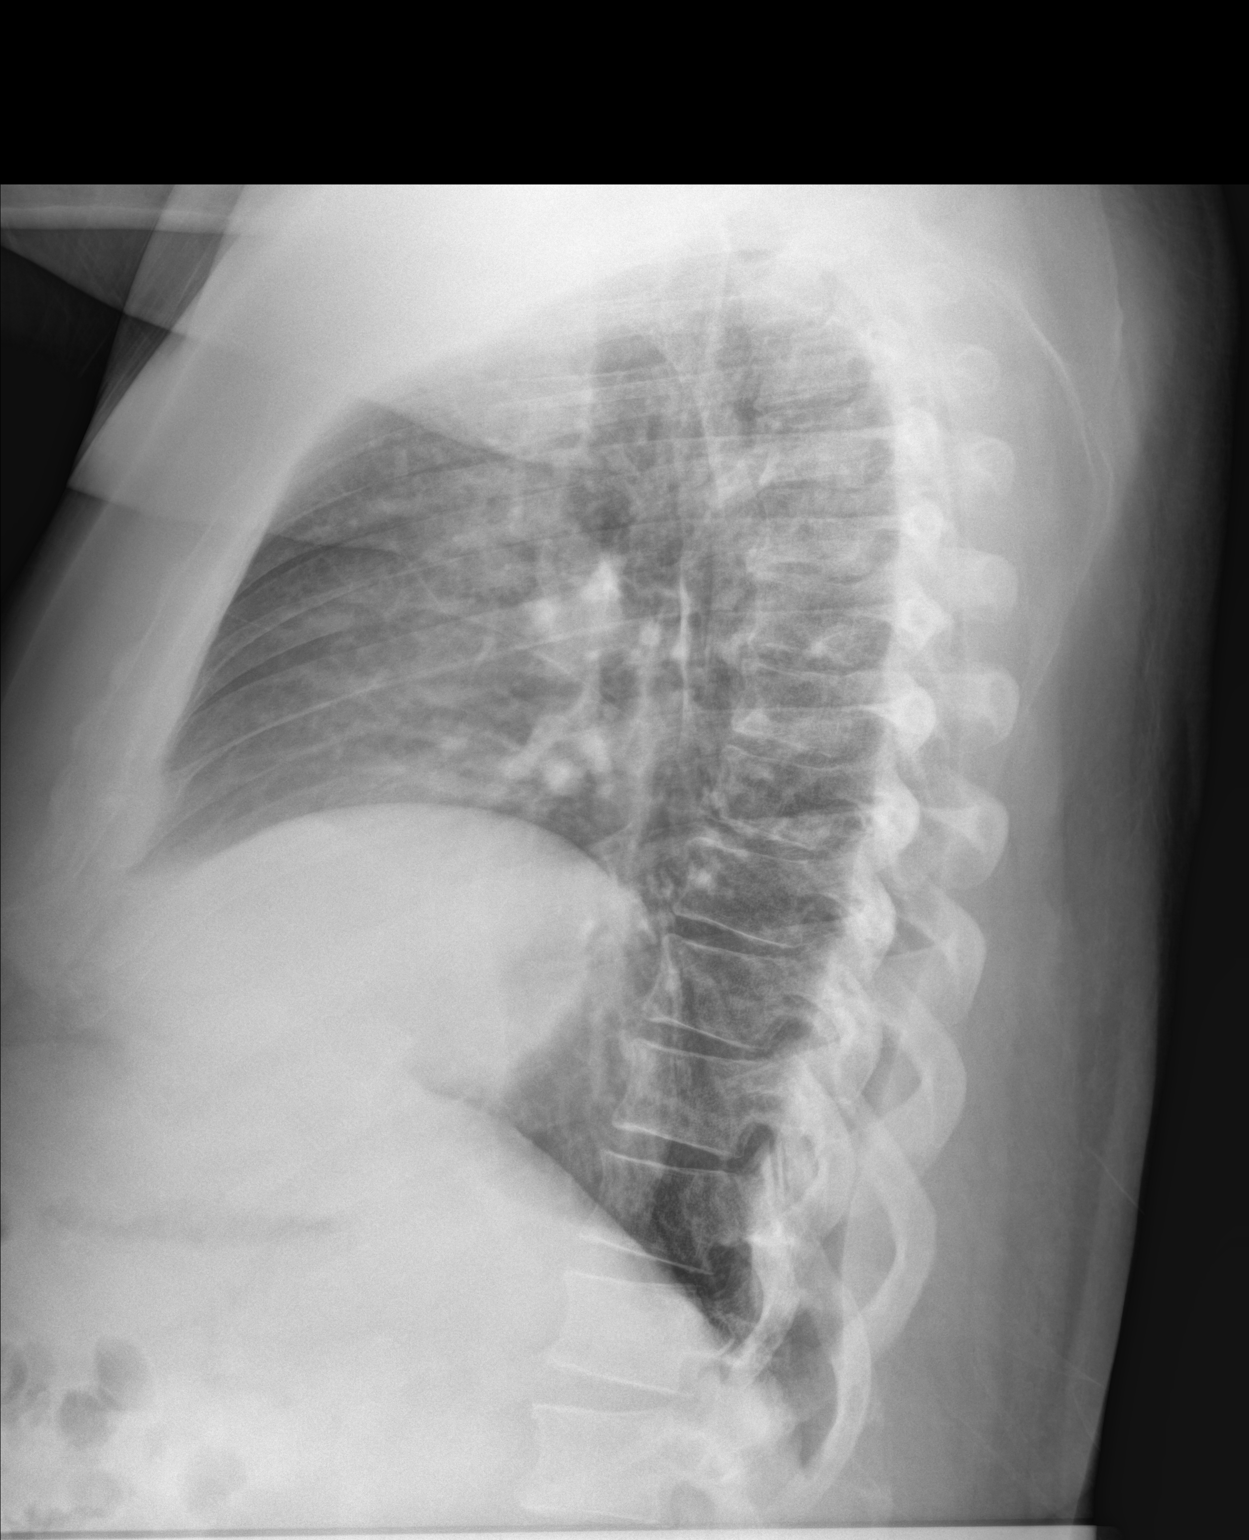

[2 of 2 positions shown; findings below may reference images not displayed]

FINDINGS: Mediastinum hilar structures are normal. Lungs are clear. Stable
cardiomegaly. No pleural effusion or pneumothorax. Stable elevation
right hemidiaphragm. No acute bony abnormality.
IMPRESSION: No acute cardiopulmonary disease.  Stable cardiomegaly.

## 2016-01-22 DIAGNOSIS — E1165 Type 2 diabetes mellitus with hyperglycemia: Secondary | ICD-10-CM | POA: Diagnosis not present

## 2016-01-22 DIAGNOSIS — M5489 Other dorsalgia: Secondary | ICD-10-CM | POA: Diagnosis not present

## 2016-01-22 DIAGNOSIS — I10 Essential (primary) hypertension: Secondary | ICD-10-CM | POA: Diagnosis not present

## 2016-03-14 DIAGNOSIS — Z6833 Body mass index (BMI) 33.0-33.9, adult: Secondary | ICD-10-CM | POA: Diagnosis not present

## 2016-03-14 DIAGNOSIS — I10 Essential (primary) hypertension: Secondary | ICD-10-CM | POA: Diagnosis not present

## 2016-03-14 DIAGNOSIS — M5136 Other intervertebral disc degeneration, lumbar region: Secondary | ICD-10-CM | POA: Diagnosis not present

## 2016-04-18 DIAGNOSIS — E785 Hyperlipidemia, unspecified: Secondary | ICD-10-CM | POA: Diagnosis not present

## 2016-04-18 DIAGNOSIS — M545 Low back pain: Secondary | ICD-10-CM | POA: Diagnosis not present

## 2016-04-18 DIAGNOSIS — E1165 Type 2 diabetes mellitus with hyperglycemia: Secondary | ICD-10-CM | POA: Diagnosis not present

## 2016-04-18 DIAGNOSIS — I10 Essential (primary) hypertension: Secondary | ICD-10-CM | POA: Diagnosis not present

## 2016-04-18 DIAGNOSIS — N401 Enlarged prostate with lower urinary tract symptoms: Secondary | ICD-10-CM | POA: Diagnosis not present

## 2016-05-02 DIAGNOSIS — I10 Essential (primary) hypertension: Secondary | ICD-10-CM | POA: Diagnosis not present

## 2016-05-02 DIAGNOSIS — E785 Hyperlipidemia, unspecified: Secondary | ICD-10-CM | POA: Diagnosis not present

## 2016-05-02 DIAGNOSIS — N401 Enlarged prostate with lower urinary tract symptoms: Secondary | ICD-10-CM | POA: Diagnosis not present

## 2016-05-02 DIAGNOSIS — Z23 Encounter for immunization: Secondary | ICD-10-CM | POA: Diagnosis not present

## 2016-05-02 DIAGNOSIS — E1165 Type 2 diabetes mellitus with hyperglycemia: Secondary | ICD-10-CM | POA: Diagnosis not present

## 2016-05-02 DIAGNOSIS — Z0001 Encounter for general adult medical examination with abnormal findings: Secondary | ICD-10-CM | POA: Diagnosis not present

## 2016-05-02 DIAGNOSIS — G8929 Other chronic pain: Secondary | ICD-10-CM | POA: Diagnosis not present

## 2016-06-10 DIAGNOSIS — Z6841 Body Mass Index (BMI) 40.0 and over, adult: Secondary | ICD-10-CM | POA: Diagnosis not present

## 2016-06-10 DIAGNOSIS — E1165 Type 2 diabetes mellitus with hyperglycemia: Secondary | ICD-10-CM | POA: Diagnosis not present

## 2016-06-10 DIAGNOSIS — I10 Essential (primary) hypertension: Secondary | ICD-10-CM | POA: Diagnosis not present

## 2016-07-01 ENCOUNTER — Emergency Department (HOSPITAL_COMMUNITY): Payer: PPO

## 2016-07-01 ENCOUNTER — Encounter (HOSPITAL_COMMUNITY): Payer: Self-pay | Admitting: *Deleted

## 2016-07-01 ENCOUNTER — Emergency Department (HOSPITAL_COMMUNITY)
Admission: EM | Admit: 2016-07-01 | Discharge: 2016-07-01 | Disposition: A | Discharge status: Home / Self Care | Payer: PPO | Attending: Emergency Medicine | Admitting: Emergency Medicine

## 2016-07-01 DIAGNOSIS — J189 Pneumonia, unspecified organism: Secondary | ICD-10-CM | POA: Diagnosis not present

## 2016-07-01 DIAGNOSIS — Z7982 Long term (current) use of aspirin: Secondary | ICD-10-CM | POA: Insufficient documentation

## 2016-07-01 DIAGNOSIS — R05 Cough: Secondary | ICD-10-CM | POA: Diagnosis not present

## 2016-07-01 DIAGNOSIS — J181 Lobar pneumonia, unspecified organism: Secondary | ICD-10-CM | POA: Diagnosis not present

## 2016-07-01 DIAGNOSIS — I10 Essential (primary) hypertension: Secondary | ICD-10-CM | POA: Insufficient documentation

## 2016-07-01 DIAGNOSIS — Z794 Long term (current) use of insulin: Secondary | ICD-10-CM | POA: Insufficient documentation

## 2016-07-01 DIAGNOSIS — E119 Type 2 diabetes mellitus without complications: Secondary | ICD-10-CM | POA: Insufficient documentation

## 2016-07-01 DIAGNOSIS — R0602 Shortness of breath: Secondary | ICD-10-CM | POA: Diagnosis not present

## 2016-07-01 LAB — CBG MONITORING, ED: Glucose-Capillary: 242 mg/dL — ABNORMAL HIGH (ref 65–99)

## 2016-07-01 LAB — BASIC METABOLIC PANEL
ANION GAP: 7 (ref 5–15)
BUN: 11 mg/dL (ref 6–20)
CALCIUM: 8.5 mg/dL — AB (ref 8.9–10.3)
CHLORIDE: 98 mmol/L — AB (ref 101–111)
CO2: 27 mmol/L (ref 22–32)
CREATININE: 1.03 mg/dL (ref 0.61–1.24)
GFR calc non Af Amer: >60 mL/min (ref >60)
Glucose, Bld: 237 mg/dL — ABNORMAL HIGH (ref 65–99)
Potassium: 3.4 mmol/L — ABNORMAL LOW (ref 3.5–5.1)
SODIUM: 132 mmol/L — AB (ref 135–145)

## 2016-07-01 LAB — D-DIMER, QUANTITATIVE (NOT AT ARMC): D DIMER QUANT: 0.52 ug{FEU}/mL — AB (ref 0.00–0.50)

## 2016-07-01 LAB — CBC WITH DIFFERENTIAL/PLATELET
BASOS PCT: 0 %
Basophils Absolute: 0 10*3/uL (ref 0.0–0.1)
Eosinophils Absolute: 0.2 10*3/uL (ref 0.0–0.7)
Eosinophils Relative: 3 %
HEMATOCRIT: 40.8 % (ref 39.0–52.0)
HEMOGLOBIN: 13.4 g/dL (ref 13.0–17.0)
LYMPHS ABS: 1.4 10*3/uL (ref 0.7–4.0)
Lymphocytes Relative: 22 %
MCH: 26.3 pg (ref 26.0–34.0)
MCHC: 32.8 g/dL (ref 30.0–36.0)
MCV: 80.2 fL (ref 78.0–100.0)
MONOS PCT: 18 %
Monocytes Absolute: 1.1 10*3/uL — ABNORMAL HIGH (ref 0.1–1.0)
NEUTROS ABS: 3.6 10*3/uL (ref 1.7–7.7)
NEUTROS PCT: 57 %
Platelets: 122 10*3/uL — ABNORMAL LOW (ref 150–400)
RBC: 5.09 MIL/uL (ref 4.22–5.81)
RDW: 14.7 % (ref 11.5–15.5)
WBC: 6.4 10*3/uL (ref 4.0–10.5)

## 2016-07-01 LAB — BRAIN NATRIURETIC PEPTIDE: B NATRIURETIC PEPTIDE 5: 31 pg/mL (ref 0.0–100.0)

## 2016-07-01 LAB — TROPONIN I

## 2016-07-01 MED ORDER — DOXYCYCLINE HYCLATE 100 MG PO TABS: 100.0000 mg | ORAL_TABLET | Freq: Two times a day (BID) | ORAL | 0 refills | Status: DC

## 2016-07-01 MED ORDER — ACETAMINOPHEN 325 MG PO TABS
650.0000 mg | ORAL_TABLET | Freq: Once | ORAL | Status: AC
  Administered 2016-07-01: 650 mg via ORAL
  Filled 2016-07-01: qty 2

## 2016-07-01 NOTE — ED Notes (Signed)
Taken to xray.

## 2016-07-01 NOTE — ED Notes (Signed)
Pt returned from xray

## 2016-07-01 NOTE — Discharge Instructions (Signed)
Use Robitussin-DM, for cough.  Drink plenty of fluids each day.  Take Tylenol every 4 hours for fever.  Start taking the antibiotic prescription today and continue until gone.  Follow-up with your doctor for checkup, next week. Your doctor will also need to order a CT scan to follow-up on the pneumonia, in about 6 weeks.

## 2016-07-01 NOTE — ED Notes (Signed)
edp in to update.  

## 2016-07-01 NOTE — ED Provider Notes (Signed)
[facility] DEPT Provider Note   CSN: [id number] Arrival date & time: 07/01/16  1348     History   Chief Complaint Chief Complaint  Patient presents with  . Shortness of Breath    HPI Vincent Ramos is a 65 y.o. male.  He presents for evaluation of cough, which was productive for 2 days now, is nonproductive for the last day. He denies fever, chills, nausea, vomiting, chest pain, weakness or dizziness. He's been following his blood pressure home and it ranges between [id number] and [id number] systolic. His PCP changed his blood pressure medicine about 1 month ago. He has a follow-up appointment next week to see his PCP. He states he is taking all of his medicines, as directed. He states that he is following his low carbohydrate diet and exercising daily. He denies weakness or paresthesia. There are no other known modifying factors.  HPI  Past Medical History:  Diagnosis Date  . Arthritis   . Diabetes mellitus   . Enlarged prostate   . Hypertension   . Pneumonia   . Shortness of breath     Patient Active Problem List   Diagnosis Date Noted  . Lumbar stenosis 10/07/2015  . CAP (community acquired pneumonia) 07/31/2014  . Diabetes ([place]) 07/31/2014  . Hypertension 07/31/2014  . Obesity 07/31/2014  . Community acquired pneumonia 07/31/2014    Past Surgical History:  Procedure Laterality Date  . CIRCUMCISION    . HYDROCELE EXCISION  10/14/2011   Procedure: HYDROCELECTOMY ADULT;  Surgeon: Malka So, MD;  Location: [facility];  Service: Urology;  Laterality: Right;  . LACERATION REPAIR     left hand  . LUMBAR LAMINECTOMY/DECOMPRESSION MICRODISCECTOMY Right 10/07/2015   Procedure: Right Lumbar four-five ,lumbar five sacral-one Laminectomy;  Surgeon: Leeroy Cha, MD;  Location: [facility];  Service: Neurosurgery;  Laterality: Right;       Home Medications    Prior to Admission medications   Medication Sig Start Date End Date Taking? Authorizing Provider  amLODipine (NORVASC)  10 MG tablet Take 5 mg by mouth daily.     Historical Provider, MD  aspirin EC 81 MG tablet Take 81 mg by mouth daily.    Historical Provider, MD  doxycycline (VIBRA-TABS) 100 MG tablet Take 1 tablet (100 mg total) by mouth 2 (two) times daily. 07/01/16   Daleen Bo, MD  enalapril (VASOTEC) 20 MG tablet Take 20 mg by mouth daily.  07/08/14   Historical Provider, MD  hydrALAZINE (APRESOLINE) 50 MG tablet Take 50 mg by mouth 2 (two) times daily. 06/13/14   Historical Provider, MD  HYDROcodone-acetaminophen (NORCO) 10-325 MG tablet Take 1 tablet by mouth every 6 (six) hours as needed for moderate pain.  08/13/15   Historical Provider, MD  insulin glargine (LANTUS) 100 UNIT/ML injection Inject 30 Units into the skin at bedtime.     Historical Provider, MD  JANUVIA 100 MG tablet Take 100 mg by mouth daily. 06/28/15   Historical Provider, MD  potassium chloride SA (K-DUR,KLOR-CON) 20 MEQ tablet Take 1 tablet (20 mEq total) by mouth daily. Patient needs appointment for future refills. 02/04/14   Lorretta Harp, MD  tetrahydrozoline 0.05 % ophthalmic solution     Historical Provider, MD  vitamin E 400 UNIT capsule Take 400 Units by mouth daily as needed (for supplementation).    Historical Provider, MD    Family History Family History  Problem Relation Age of Onset  . Diabetes    . Hypertension    .  CAD    . Cancer    . Anesthesia problems Neg Hx   . Hypotension Neg Hx   . Malignant hyperthermia Neg Hx   . Pseudochol deficiency Neg Hx     Social History Social History  Substance Use Topics  . Smoking status: Never Smoker  . Smokeless tobacco: Never Used  . Alcohol use No     Comment: occassional     Allergies   Metformin and related   Review of Systems Review of Systems  All other systems reviewed and are negative.    Physical Exam Updated Vital Signs BP 143/84   Pulse 87   Temp 99.3 F (37.4 C) (Oral)   Resp 13   Ht 5' 6.5" (1.689 m)   Wt 270 lb (122.5 kg)   SpO2 96%    BMI 42.93 kg/m   Physical Exam  Constitutional: He is oriented to person, place, and time. He appears well-developed. He appears distressed (Occasional nonproductive cough).  Obese.  HENT:  Head: Normocephalic and atraumatic.  Right Ear: External ear normal.  Left Ear: External ear normal.  Eyes: Conjunctivae and EOM are normal. Pupils are equal, round, and reactive to light.  Neck: Normal range of motion and phonation normal. Neck supple.  Cardiovascular: Normal rate, regular rhythm and normal heart sounds.   Pulmonary/Chest: Effort normal and breath sounds normal. No respiratory distress. He has no wheezes. He has no rales. He exhibits no bony tenderness.  Abdominal: Soft. There is no tenderness.  Musculoskeletal: Normal range of motion. He exhibits edema (1+ bilateral lower legs). He exhibits no tenderness.  Neurological: He is alert and oriented to person, place, and time. No cranial nerve deficit or sensory deficit. He exhibits normal muscle tone. Coordination normal.  Skin: Skin is warm, dry and intact.  Psychiatric: He has a normal mood and affect. His behavior is normal. Judgment and thought content normal.  Nursing note and vitals reviewed.    ED Treatments / Results  Labs (all labs ordered are listed, but only abnormal results are displayed) Labs Reviewed  CBC WITH DIFFERENTIAL/PLATELET - Abnormal; Notable for the following:       Result Value   Platelets 122 (*)    Monocytes Absolute 1.1 (*)    All other components within normal limits  BASIC METABOLIC PANEL - Abnormal; Notable for the following:    Sodium 132 (*)    Potassium 3.4 (*)    Chloride 98 (*)    Glucose, Bld 237 (*)    Calcium 8.5 (*)    All other components within normal limits  D-DIMER, QUANTITATIVE (NOT AT [facility]) - Abnormal; Notable for the following:    D-Dimer, Quant 0.52 (*)    All other components within normal limits  CBG MONITORING, ED - Abnormal; Notable for the following:     Glucose-Capillary 242 (*)    All other components within normal limits  BRAIN NATRIURETIC PEPTIDE  TROPONIN I    EKG  EKG Interpretation  Date/Time:  Friday July 01 2016 14:05:44 EST Ventricular Rate:  97 PR Interval:    QRS Duration: 97 QT Interval:  343 QTC Calculation: 436 R Axis:   65 Text Interpretation:  Sinus rhythm Anteroseptal infarct, old since last tracing no significant change Confirmed by Eulis Foster  MD, Anais Koenen [phone]) on 07/01/2016 2:09:27 PM       Radiology Dg Chest 2 View  Result Date: 07/01/2016 CLINICAL DATA:  Shortness of breath, productive cough. EXAM: CHEST  2 VIEW COMPARISON:  Radiographs  of February 04, 2015. FINDINGS: Stable cardiomegaly. No pneumothorax or pleural effusion is noted. Right lung is clear. Stable elevation of right hemidiaphragm is noted. Lingular opacity is noted concerning for pneumonia or atelectasis. Bony thorax is unremarkable. IMPRESSION: Lingular opacity concerning for pneumonia or subsegmental atelectasis. Follow-up PA and lateral views of the chest in 3-4 weeks are recommended to ensure resolution and rule out underlying neoplasm. Electronically Signed   By: Marijo Conception, M.D.   On: 07/01/2016 14:49    Procedures Procedures (including critical care time)  Medications Ordered in ED Medications - No data to display   Initial Impression / Assessment and Plan / ED Course  I have reviewed the triage vital signs and the nursing notes.  Pertinent labs & imaging results that were available during my care of the patient were reviewed by me and considered in my medical decision making (see chart for details).  Clinical Course     Medications - No data to display  Patient Vitals for the past 24 hrs:  BP Temp Temp src Pulse Resp SpO2 Height Weight  07/01/16 1530 143/84 - - 87 13 96 % - -  07/01/16 1510 180/92 - - 89 16 94 % - -  07/01/16 1352 (!) 202/83 99.3 F (37.4 C) Oral 92 20 96 % 5' 6.5" (1.689 m) 270 lb (122.5 kg)    4:10 PM  Reevaluation with update and discussion. After initial assessment and treatment, an updated evaluation reveals No additional complaints. Findings discussed with patient and wife, all questions answered. Khalilah Hoke L    Final Clinical Impressions(s) / ED Diagnoses   Final diagnoses:  Community acquired pneumonia of left lower lobe of lung ([place])   Cough with findings for pneumonia. Community-acquired, without significant associated comorbidities. Blood pressure improved to 143/84, spontaneously. Doubt serious bacterial infection, metabolic instability or impending vascular collapse.  Nursing Notes Reviewed/ Care Coordinated Applicable Imaging Reviewed Interpretation of Laboratory Data incorporated into ED treatment  The patient appears reasonably screened and/or stabilized for discharge and I doubt any other medical condition or other [facility] requiring further screening, evaluation, or treatment in the ED at this time prior to discharge.  Plan: Home Medications- continue; Home Treatments- rest, fluids; return here if the recommended treatment, does not improve the symptoms; Recommended follow up- PCP 1 and 6 weeks. Repeat eval. With CT in 6 weeks.    New Prescriptions New Prescriptions   DOXYCYCLINE (VIBRA-TABS) 100 MG TABLET    Take 1 tablet (100 mg total) by mouth 2 (two) times daily.     Daleen Bo, MD 07/01/16 [phone]

## 2016-07-01 NOTE — ED Triage Notes (Signed)
Pt comes in with shortness of breath starting in the last 3 days. Pt states he has been having a productive cough but that stopped and now he has a non-productive cough. NAD noted. Pt is short of breath after walking to triage. Once sitting, pt was no longer SOB.   Also, pt has increased BP in triage. 202/83. Pt had a recent change in medication.

## 2016-07-04 DIAGNOSIS — I10 Essential (primary) hypertension: Secondary | ICD-10-CM | POA: Diagnosis not present

## 2016-08-02 DIAGNOSIS — H524 Presbyopia: Secondary | ICD-10-CM | POA: Diagnosis not present

## 2016-08-02 DIAGNOSIS — H2513 Age-related nuclear cataract, bilateral: Secondary | ICD-10-CM | POA: Diagnosis not present

## 2016-08-02 DIAGNOSIS — H5201 Hypermetropia, right eye: Secondary | ICD-10-CM | POA: Diagnosis not present

## 2016-08-02 DIAGNOSIS — Z794 Long term (current) use of insulin: Secondary | ICD-10-CM | POA: Diagnosis not present

## 2016-08-02 DIAGNOSIS — H52203 Unspecified astigmatism, bilateral: Secondary | ICD-10-CM | POA: Diagnosis not present

## 2016-08-02 DIAGNOSIS — E119 Type 2 diabetes mellitus without complications: Secondary | ICD-10-CM | POA: Diagnosis not present

## 2016-09-05 IMAGING — CR DG LUMBAR SPINE 2-3V
1 series · 1 of 1 positions shown · non-contrast
Comparison: None.

CLINICAL DATA: Portable lumbar spine imaging for surgical
localization.

EXAM:
LUMBAR SPINE - 2-3 VIEW

[lat]
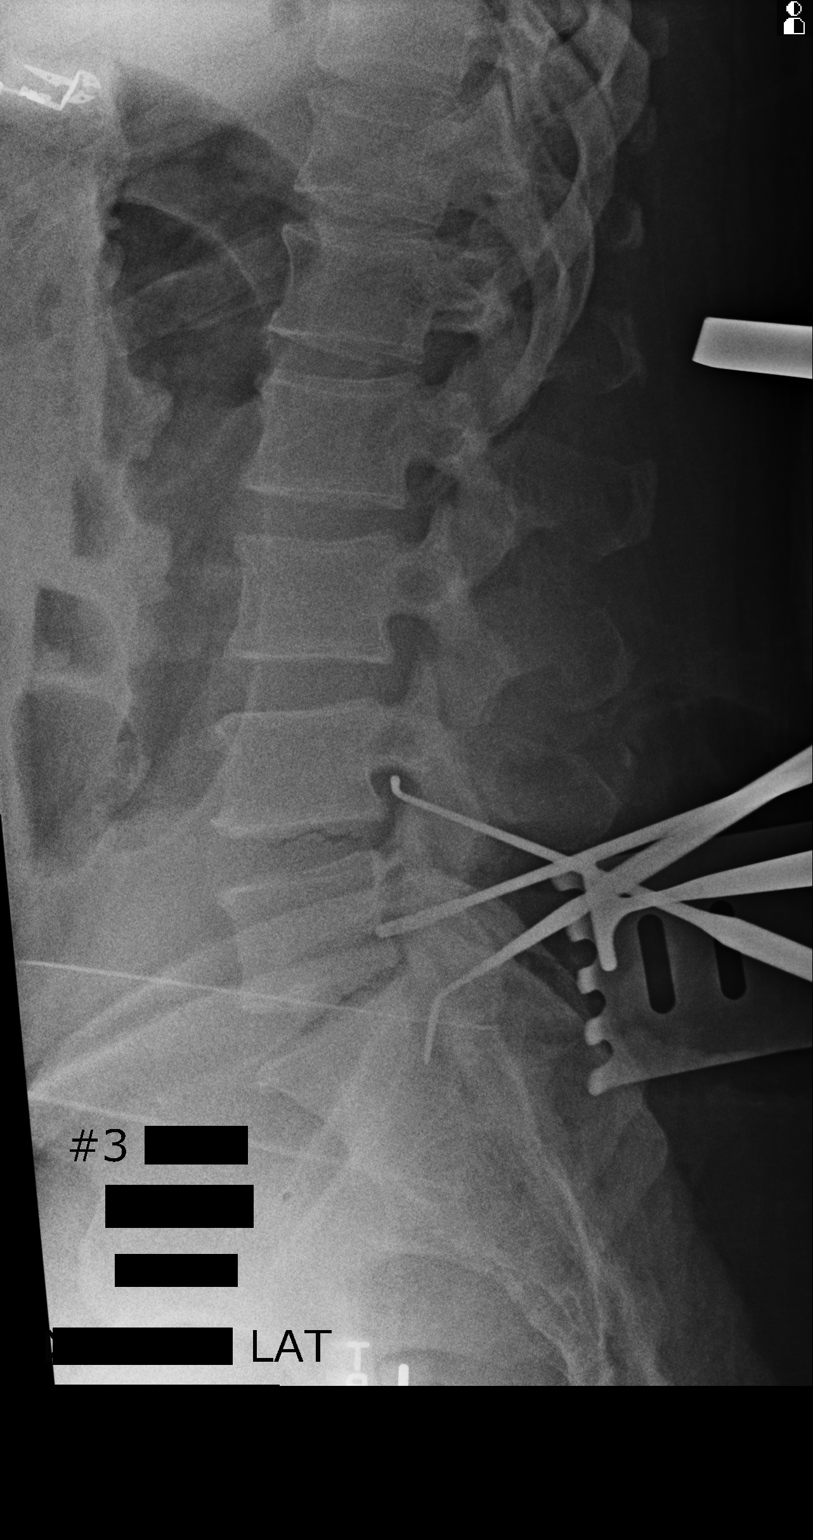

[1 of 1 positions shown; findings below may reference images not displayed]

FINDINGS: Initial imaging shows placement of a surgical needle posterior to
the posterior aspect of the L4 spinous process. Followup image shows
placement of a surgical probe with its tip superimposed over the
posterior lower margin of the L5 vertebra. Final image shows
placement of 3 surgical probes, the more superior with its tip just
posterior to the posterior lower aspect of the L4 vertebra, the most
inferior with its tip projecting along the posterior margin of the
S1 vertebra and the central probe with its tip superimposed over the
lower posterior margin of the L5 vertebra.
IMPRESSION: Surgical localization imaging as described.

## 2016-09-06 DIAGNOSIS — I10 Essential (primary) hypertension: Secondary | ICD-10-CM | POA: Diagnosis not present

## 2016-09-06 DIAGNOSIS — E785 Hyperlipidemia, unspecified: Secondary | ICD-10-CM | POA: Diagnosis not present

## 2016-09-06 DIAGNOSIS — E1165 Type 2 diabetes mellitus with hyperglycemia: Secondary | ICD-10-CM | POA: Diagnosis not present

## 2016-09-07 DIAGNOSIS — N529 Male erectile dysfunction, unspecified: Secondary | ICD-10-CM | POA: Diagnosis not present

## 2016-09-07 DIAGNOSIS — N401 Enlarged prostate with lower urinary tract symptoms: Secondary | ICD-10-CM | POA: Diagnosis not present

## 2016-09-07 DIAGNOSIS — M545 Low back pain: Secondary | ICD-10-CM | POA: Diagnosis not present

## 2016-09-07 DIAGNOSIS — E1165 Type 2 diabetes mellitus with hyperglycemia: Secondary | ICD-10-CM | POA: Diagnosis not present

## 2016-09-07 DIAGNOSIS — E785 Hyperlipidemia, unspecified: Secondary | ICD-10-CM | POA: Diagnosis not present

## 2016-09-07 DIAGNOSIS — I10 Essential (primary) hypertension: Secondary | ICD-10-CM | POA: Diagnosis not present

## 2016-10-05 DIAGNOSIS — E785 Hyperlipidemia, unspecified: Secondary | ICD-10-CM | POA: Diagnosis not present

## 2016-10-05 DIAGNOSIS — Z6841 Body Mass Index (BMI) 40.0 and over, adult: Secondary | ICD-10-CM | POA: Diagnosis not present

## 2016-10-05 DIAGNOSIS — E1165 Type 2 diabetes mellitus with hyperglycemia: Secondary | ICD-10-CM | POA: Diagnosis not present

## 2016-10-05 DIAGNOSIS — M545 Low back pain: Secondary | ICD-10-CM | POA: Diagnosis not present

## 2016-10-05 DIAGNOSIS — I10 Essential (primary) hypertension: Secondary | ICD-10-CM | POA: Diagnosis not present

## 2016-11-07 DIAGNOSIS — E1165 Type 2 diabetes mellitus with hyperglycemia: Secondary | ICD-10-CM | POA: Diagnosis not present

## 2016-11-07 DIAGNOSIS — M545 Low back pain: Secondary | ICD-10-CM | POA: Diagnosis not present

## 2016-11-07 DIAGNOSIS — I10 Essential (primary) hypertension: Secondary | ICD-10-CM | POA: Diagnosis not present

## 2016-11-07 DIAGNOSIS — Z6841 Body Mass Index (BMI) 40.0 and over, adult: Secondary | ICD-10-CM | POA: Diagnosis not present

## 2016-11-07 DIAGNOSIS — E785 Hyperlipidemia, unspecified: Secondary | ICD-10-CM | POA: Diagnosis not present

## 2017-01-05 DIAGNOSIS — Z79899 Other long term (current) drug therapy: Secondary | ICD-10-CM | POA: Diagnosis not present

## 2017-01-27 ENCOUNTER — Emergency Department (HOSPITAL_COMMUNITY): Payer: PPO

## 2017-01-27 ENCOUNTER — Emergency Department (HOSPITAL_COMMUNITY)
Admission: EM | Admit: 2017-01-27 | Discharge: 2017-01-27 | Disposition: A | Discharge status: Home / Self Care | Payer: PPO | Attending: Emergency Medicine | Admitting: Emergency Medicine

## 2017-01-27 ENCOUNTER — Encounter (HOSPITAL_COMMUNITY): Payer: Self-pay | Admitting: Emergency Medicine

## 2017-01-27 DIAGNOSIS — E119 Type 2 diabetes mellitus without complications: Secondary | ICD-10-CM | POA: Insufficient documentation

## 2017-01-27 DIAGNOSIS — I10 Essential (primary) hypertension: Secondary | ICD-10-CM | POA: Insufficient documentation

## 2017-01-27 DIAGNOSIS — R0602 Shortness of breath: Secondary | ICD-10-CM | POA: Diagnosis not present

## 2017-01-27 DIAGNOSIS — R0789 Other chest pain: Secondary | ICD-10-CM

## 2017-01-27 DIAGNOSIS — Z7982 Long term (current) use of aspirin: Secondary | ICD-10-CM | POA: Diagnosis not present

## 2017-01-27 DIAGNOSIS — Z794 Long term (current) use of insulin: Secondary | ICD-10-CM | POA: Diagnosis not present

## 2017-01-27 DIAGNOSIS — R079 Chest pain, unspecified: Secondary | ICD-10-CM | POA: Diagnosis not present

## 2017-01-27 LAB — COMPREHENSIVE METABOLIC PANEL
ALBUMIN: 3.8 g/dL (ref 3.5–5.0)
ALT: 25 U/L (ref 17–63)
AST: 25 U/L (ref 15–41)
Alkaline Phosphatase: 62 U/L (ref 38–126)
Anion gap: 9 (ref 5–15)
BUN: 15 mg/dL (ref 6–20)
CHLORIDE: 101 mmol/L (ref 101–111)
CO2: 29 mmol/L (ref 22–32)
Calcium: 9 mg/dL (ref 8.9–10.3)
Creatinine, Ser: 1.37 mg/dL — ABNORMAL HIGH (ref 0.61–1.24)
GFR calc Af Amer: >60 mL/min (ref >60)
GFR, EST NON AFRICAN AMERICAN: 53 mL/min — AB (ref >60)
GLUCOSE: 222 mg/dL — AB (ref 65–99)
POTASSIUM: 3.5 mmol/L (ref 3.5–5.1)
Sodium: 139 mmol/L (ref 135–145)
Total Bilirubin: 0.7 mg/dL (ref 0.3–1.2)
Total Protein: 7 g/dL (ref 6.5–8.1)

## 2017-01-27 LAB — CBC
HCT: 42.5 % (ref 39.0–52.0)
Hemoglobin: 13.9 g/dL (ref 13.0–17.0)
MCH: 26.7 pg (ref 26.0–34.0)
MCHC: 32.7 g/dL (ref 30.0–36.0)
MCV: 81.6 fL (ref 78.0–100.0)
PLATELETS: 163 10*3/uL (ref 150–400)
RBC: 5.21 MIL/uL (ref 4.22–5.81)
RDW: 14.9 % (ref 11.5–15.5)
WBC: 7.8 10*3/uL (ref 4.0–10.5)

## 2017-01-27 LAB — D-DIMER, QUANTITATIVE: D-Dimer, Quant: 0.59 ug/mL-FEU — ABNORMAL HIGH (ref 0.00–0.50)

## 2017-01-27 LAB — TROPONIN I

## 2017-01-27 MED ORDER — OXYCODONE-ACETAMINOPHEN 5-325 MG PO TABS
1.0000 | ORAL_TABLET | Freq: Once | ORAL | Status: AC
  Administered 2017-01-27: 1 via ORAL
  Filled 2017-01-27: qty 1

## 2017-01-27 MED ORDER — IOPAMIDOL (ISOVUE-370) INJECTION 76%
100.0000 mL | Freq: Once | INTRAVENOUS | Status: AC | PRN
  Administered 2017-01-27: 100 mL via INTRAVENOUS

## 2017-01-27 MED ORDER — TRAMADOL HCL 50 MG PO TABS: 50.0000 mg | ORAL_TABLET | Freq: Four times a day (QID) | ORAL | 0 refills | Status: DC | PRN

## 2017-01-27 NOTE — ED Notes (Signed)
Pt gone to CT 

## 2017-01-27 NOTE — ED Triage Notes (Signed)
PT c/o right sided sharp chest pain that started x3 days ago.

## 2017-01-27 NOTE — ED Provider Notes (Signed)
Vincent Ramos Provider Note   CSN: [id number] Arrival date & time: 01/27/17  1155     History   Chief Complaint Chief Complaint  Patient presents with  . Chest Pain    HPI Vincent Ramos is a 66 y.o. male.    Patient complains of right side chest wall pain worse with movement and inspiration   The history is provided by the patient. No language interpreter was used.  Chest Pain   This is a new problem. The current episode started 2 days ago. The problem occurs constantly. The problem has not changed since onset.The pain is associated with movement. The pain is present in the lateral region. The pain is at a severity of 5/10. The pain is moderate. The quality of the pain is described as sharp. The pain does not radiate. Pertinent negatives include no abdominal pain, no back pain, no cough and no headaches.  Pertinent negatives for past medical history include no seizures.    Past Medical History:  Diagnosis Date  . Arthritis   . Diabetes mellitus   . Enlarged prostate   . Hypertension   . Pneumonia   . Shortness of breath     Patient Active Problem List   Diagnosis Date Noted  . Lumbar stenosis 10/07/2015  . CAP (community acquired pneumonia) 07/31/2014  . Diabetes ([place]) 07/31/2014  . Hypertension 07/31/2014  . Obesity 07/31/2014  . Community acquired pneumonia 07/31/2014    Past Surgical History:  Procedure Laterality Date  . CIRCUMCISION    . HYDROCELE EXCISION  10/14/2011   Procedure: HYDROCELECTOMY ADULT;  Surgeon: Malka So, MD;  Location: [facility];  Service: Urology;  Laterality: Right;  . LACERATION REPAIR     left hand  . LUMBAR LAMINECTOMY/DECOMPRESSION MICRODISCECTOMY Right 10/07/2015   Procedure: Right Lumbar four-five ,lumbar five sacral-one Laminectomy;  Surgeon: Leeroy Cha, MD;  Location: [place] [facility];  Service: Neurosurgery;  Laterality: Right;       Home Medications    Prior to Admission medications   Medication Sig Start Date  End Date Taking? Authorizing Provider  amLODipine (NORVASC) 10 MG tablet Take 5 mg by mouth daily.    Yes [provider]  aspirin EC 81 MG tablet Take 81 mg by mouth daily.   Yes [provider]  cloNIDine (CATAPRES) 0.1 MG tablet Take 0.1 mg by mouth 2 (two) times daily.   Yes [provider]  glipiZIDE (GLUCOTROL) 10 MG tablet Take 10 mg by mouth 2 (two) times daily before a meal.   Yes [provider]  hydrochlorothiazide (MICROZIDE) 12.5 MG capsule Take 12.5 mg by mouth daily.   Yes [provider]  Insulin Degludec (TRESIBA FLEXTOUCH) 200 UNIT/ML SOPN Inject 40-50 Units into the skin at bedtime.   Yes [provider]  liraglutide (VICTOZA) 18 MG/3ML SOPN Inject 18 mg into the skin daily.   Yes [provider]  Multiple Vitamins-Minerals (ONE-A-DAY MENS 50+ ADVANTAGE PO) Take 1 tablet by mouth daily.   Yes [provider]  valsartan (DIOVAN) 320 MG tablet Take 320 mg by mouth daily.   Yes [provider]  traMADol (ULTRAM) 50 MG tablet Take 1 tablet (50 mg total) by mouth every 6 (six) hours as needed. 01/27/17   Milton Ferguson, MD    Family History Family History  Problem Relation Age of Onset  . Diabetes Unknown   . Hypertension Unknown   . CAD Unknown   . Cancer Unknown   . Anesthesia  problems Neg Hx   . Hypotension Neg Hx   . Malignant hyperthermia Neg Hx   . Pseudochol deficiency Neg Hx     Social History Social History  Substance Use Topics  . Smoking status: Never Smoker  . Smokeless tobacco: Never Used  . Alcohol use No     Comment: occassional     Allergies   Patient has no active allergies.   Review of Systems Review of Systems  Constitutional: Negative for appetite change and fatigue.  HENT: Negative for congestion, ear discharge and sinus pressure.   Eyes: Negative for discharge.  Respiratory: Negative for cough.   Cardiovascular: Positive for chest pain.  Gastrointestinal:  Negative for abdominal pain and diarrhea.  Genitourinary: Negative for frequency and hematuria.  Musculoskeletal: Negative for back pain.  Skin: Negative for rash.  Neurological: Negative for seizures and headaches.  Psychiatric/Behavioral: Negative for hallucinations.     Physical Exam Updated Vital Signs BP (!) 172/86   Pulse 81   Temp 98.3 F (36.8 C) (Oral)   Resp 16   Ht 5' 6.5" (1.689 m)   Wt 122.5 kg (270 lb)   SpO2 94%   BMI 42.93 kg/m   Physical Exam  Constitutional: He is oriented to person, place, and time. He appears well-developed.  HENT:  Head: Normocephalic.  Eyes: Conjunctivae and EOM are normal. No scleral icterus.  Neck: Neck supple. No thyromegaly present.  Cardiovascular: Normal rate and regular rhythm.  Exam reveals no gallop and no friction rub.   No murmur heard. Pulmonary/Chest: No stridor. He has no wheezes. He has no rales. He exhibits tenderness.  Tender right lateral chest  Abdominal: He exhibits no distension. There is no tenderness. There is no rebound.  Musculoskeletal: Normal range of motion. He exhibits no edema.  Lymphadenopathy:    He has no cervical adenopathy.  Neurological: He is oriented to person, place, and time. He exhibits normal muscle tone. Coordination normal.  Skin: No rash noted. No erythema.  Psychiatric: He has a normal mood and affect. His behavior is normal.     ED Treatments / Results  Labs (all labs ordered are listed, but only abnormal results are displayed) Labs Reviewed  COMPREHENSIVE METABOLIC PANEL - Abnormal; Notable for the following:       Result Value   Glucose, Bld 222 (*)    Creatinine, Ser 1.37 (*)    GFR calc non Af Amer 53 (*)    All other components within normal limits  D-DIMER, QUANTITATIVE (NOT AT [facility]) - Abnormal; Notable for the following:    D-Dimer, Quant 0.59 (*)    All other components within normal limits  CBC  TROPONIN I    EKG  EKG Interpretation  Date/Time:  Friday January 27 2017 12:11:13 EDT Ventricular Rate:  90 PR Interval:  168 QRS Duration: 96 QT Interval:  370 QTC Calculation: 452 R Axis:   -8 Text Interpretation:  Normal sinus rhythm Cannot rule out Anterior infarct , age undetermined Abnormal ECG Confirmed by Milton Ferguson [phone] on 01/27/2017 2:29:17 PM       Radiology Dg Chest 2 View  Result Date: 01/27/2017 CLINICAL DATA:  Right-sided chest pain and short of breath for 2 days EXAM: CHEST  2 VIEW COMPARISON:  07/01/2016 FINDINGS: The heart is mildly enlarged. Airspace disease at the left base has improved. Lungs remain under aerated. There is elevation of the right hemidiaphragm. Pleural thickening bilaterally and peripherally towards the bases is stable. No pleural effusion. No  pneumothorax. IMPRESSION: Improved pneumonia at the left base. This supports inflammatory etiology. Electronically Signed   By: Marybelle Killings M.D.   On: 01/27/2017 12:36   Ct Angio Chest Pe W And/or Wo Contrast  Result Date: 01/27/2017 CLINICAL DATA:  Right-sided sharp chest pain starting 3 days ago. History of diabetes, hypertension, shortness of breath. EXAM: CT ANGIOGRAPHY CHEST WITH CONTRAST TECHNIQUE: Multidetector CT imaging of the chest was performed using the standard protocol during bolus administration of intravenous contrast. Multiplanar CT image reconstructions and MIPs were obtained to evaluate the vascular anatomy. CONTRAST:  100 cc Isovue 370 COMPARISON:  None. FINDINGS: Cardiovascular: No pulmonary embolism within the main, lobar or segmental pulmonary arteries bilaterally. Thoracic aorta is normal in caliber and configuration. No aortic aneurysm or dissection. Minimal atherosclerosis of the aortic arch. Heart size is normal. No pericardial effusion. Mediastinum/Nodes: No mass or enlarged lymph nodes within the mediastinum or perihilar regions. Esophagus appears normal. Trachea and central bronchi are unremarkable. Lungs/Pleura: Mild atelectasis/scarring and chronic  pleural thickening at each lung base. Lungs otherwise clear. No evidence of pneumonia. No pleural effusion or pneumothorax. Upper Abdomen: No acute findings. Musculoskeletal: Mild degenerative spurring within the thoracic spine. No acute or suspicious osseous finding. Superficial soft tissues are unremarkable. Review of the MIP images confirms the above findings. IMPRESSION: 1. No acute findings. No pulmonary embolism. No aortic aneurysm or dissection. No pneumonia or pulmonary edema. 2. Aortic atherosclerosis. 3. Additional chronic/incidental findings detailed above. Aortic Atherosclerosis (ICD10-I70.0). Electronically Signed   By: Franki Cabot M.D.   On: 01/27/2017 16:24    Procedures Procedures (including critical care time)  Medications Ordered in ED Medications  oxyCODONE-acetaminophen (PERCOCET/ROXICET) 5-325 MG per tablet 1 tablet (1 tablet Oral Given 01/27/17 1456)  iopamidol (ISOVUE-370) 76 % injection 100 mL (100 mLs Intravenous Contrast Given 01/27/17 1603)     Initial Impression / Assessment and Plan / ED Course  I have reviewed the triage vital signs and the nursing notes.  Pertinent labs & imaging results that were available during my care of the patient were reviewed by me and considered in my medical decision making (see chart for details).     Patient has chest wall pain. Patient will be placed on Ultram  Final Clinical Impressions(s) / ED Diagnoses   Final diagnoses:  Chest wall pain    New Prescriptions New Prescriptions   TRAMADOL (ULTRAM) 50 MG TABLET    Take 1 tablet (50 mg total) by mouth every 6 (six) hours as needed.     Milton Ferguson, MD 01/27/17 [phone]

## 2017-01-27 NOTE — Discharge Instructions (Signed)
Follow up with your md in 1-2 weeks if not improving.

## 2017-02-08 DIAGNOSIS — I10 Essential (primary) hypertension: Secondary | ICD-10-CM | POA: Diagnosis not present

## 2017-02-08 DIAGNOSIS — Z1159 Encounter for screening for other viral diseases: Secondary | ICD-10-CM | POA: Diagnosis not present

## 2017-02-08 DIAGNOSIS — E1165 Type 2 diabetes mellitus with hyperglycemia: Secondary | ICD-10-CM | POA: Diagnosis not present

## 2017-02-08 DIAGNOSIS — N401 Enlarged prostate with lower urinary tract symptoms: Secondary | ICD-10-CM | POA: Diagnosis not present

## 2017-02-15 DIAGNOSIS — Z6841 Body Mass Index (BMI) 40.0 and over, adult: Secondary | ICD-10-CM | POA: Diagnosis not present

## 2017-02-15 DIAGNOSIS — E1165 Type 2 diabetes mellitus with hyperglycemia: Secondary | ICD-10-CM | POA: Diagnosis not present

## 2017-02-15 DIAGNOSIS — E785 Hyperlipidemia, unspecified: Secondary | ICD-10-CM | POA: Diagnosis not present

## 2017-02-15 DIAGNOSIS — I10 Essential (primary) hypertension: Secondary | ICD-10-CM | POA: Diagnosis not present

## 2017-02-15 DIAGNOSIS — M545 Low back pain: Secondary | ICD-10-CM | POA: Diagnosis not present

## 2017-02-15 DIAGNOSIS — N529 Male erectile dysfunction, unspecified: Secondary | ICD-10-CM | POA: Diagnosis not present

## 2017-02-15 DIAGNOSIS — Z713 Dietary counseling and surveillance: Secondary | ICD-10-CM | POA: Diagnosis not present

## 2017-04-05 DIAGNOSIS — Z6841 Body Mass Index (BMI) 40.0 and over, adult: Secondary | ICD-10-CM | POA: Diagnosis not present

## 2017-04-05 DIAGNOSIS — R05 Cough: Secondary | ICD-10-CM | POA: Diagnosis not present

## 2017-04-05 DIAGNOSIS — R0602 Shortness of breath: Secondary | ICD-10-CM | POA: Diagnosis not present

## 2017-04-05 DIAGNOSIS — R062 Wheezing: Secondary | ICD-10-CM | POA: Diagnosis not present

## 2017-05-10 DIAGNOSIS — E1165 Type 2 diabetes mellitus with hyperglycemia: Secondary | ICD-10-CM | POA: Diagnosis not present

## 2017-05-10 DIAGNOSIS — E785 Hyperlipidemia, unspecified: Secondary | ICD-10-CM | POA: Diagnosis not present

## 2017-05-18 DIAGNOSIS — E1165 Type 2 diabetes mellitus with hyperglycemia: Secondary | ICD-10-CM | POA: Diagnosis not present

## 2017-05-18 DIAGNOSIS — I10 Essential (primary) hypertension: Secondary | ICD-10-CM | POA: Diagnosis not present

## 2017-05-18 DIAGNOSIS — J209 Acute bronchitis, unspecified: Secondary | ICD-10-CM | POA: Diagnosis not present

## 2017-05-18 DIAGNOSIS — M545 Low back pain: Secondary | ICD-10-CM | POA: Diagnosis not present

## 2017-05-18 DIAGNOSIS — R600 Localized edema: Secondary | ICD-10-CM | POA: Diagnosis not present

## 2017-05-18 DIAGNOSIS — Z8701 Personal history of pneumonia (recurrent): Secondary | ICD-10-CM | POA: Diagnosis not present

## 2017-05-18 DIAGNOSIS — E785 Hyperlipidemia, unspecified: Secondary | ICD-10-CM | POA: Diagnosis not present

## 2017-05-18 DIAGNOSIS — N401 Enlarged prostate with lower urinary tract symptoms: Secondary | ICD-10-CM | POA: Diagnosis not present

## 2017-05-18 DIAGNOSIS — N529 Male erectile dysfunction, unspecified: Secondary | ICD-10-CM | POA: Diagnosis not present

## 2017-05-30 ENCOUNTER — Encounter (HOSPITAL_COMMUNITY): Payer: Self-pay | Admitting: Emergency Medicine

## 2017-05-30 ENCOUNTER — Emergency Department (HOSPITAL_COMMUNITY): Payer: PPO

## 2017-05-30 ENCOUNTER — Inpatient Hospital Stay (HOSPITAL_COMMUNITY)
Admission: EM | Admit: 2017-05-30 | Discharge: 2017-06-08 | DRG: 177 | Disposition: A | Discharge status: Home / Self Care | Payer: PPO | Attending: Family Medicine | Admitting: Family Medicine

## 2017-05-30 DIAGNOSIS — T380X5A Adverse effect of glucocorticoids and synthetic analogues, initial encounter: Secondary | ICD-10-CM | POA: Diagnosis not present

## 2017-05-30 DIAGNOSIS — Z794 Long term (current) use of insulin: Secondary | ICD-10-CM

## 2017-05-30 DIAGNOSIS — J690 Pneumonitis due to inhalation of food and vomit: Secondary | ICD-10-CM | POA: Diagnosis present

## 2017-05-30 DIAGNOSIS — J189 Pneumonia, unspecified organism: Secondary | ICD-10-CM | POA: Diagnosis not present

## 2017-05-30 DIAGNOSIS — E669 Obesity, unspecified: Secondary | ICD-10-CM | POA: Diagnosis present

## 2017-05-30 DIAGNOSIS — J159 Unspecified bacterial pneumonia: Secondary | ICD-10-CM | POA: Diagnosis not present

## 2017-05-30 DIAGNOSIS — M4322 Fusion of spine, cervical region: Secondary | ICD-10-CM | POA: Diagnosis not present

## 2017-05-30 DIAGNOSIS — J9601 Acute respiratory failure with hypoxia: Secondary | ICD-10-CM | POA: Diagnosis not present

## 2017-05-30 DIAGNOSIS — J181 Lobar pneumonia, unspecified organism: Secondary | ICD-10-CM | POA: Diagnosis not present

## 2017-05-30 DIAGNOSIS — R1310 Dysphagia, unspecified: Secondary | ICD-10-CM

## 2017-05-30 DIAGNOSIS — Z23 Encounter for immunization: Secondary | ICD-10-CM

## 2017-05-30 DIAGNOSIS — Z6841 Body Mass Index (BMI) 40.0 and over, adult: Secondary | ICD-10-CM

## 2017-05-30 DIAGNOSIS — R1314 Dysphagia, pharyngoesophageal phase: Secondary | ICD-10-CM | POA: Diagnosis present

## 2017-05-30 DIAGNOSIS — R05 Cough: Secondary | ICD-10-CM | POA: Diagnosis not present

## 2017-05-30 DIAGNOSIS — Z79899 Other long term (current) drug therapy: Secondary | ICD-10-CM | POA: Diagnosis not present

## 2017-05-30 DIAGNOSIS — E119 Type 2 diabetes mellitus without complications: Secondary | ICD-10-CM

## 2017-05-30 DIAGNOSIS — R0602 Shortness of breath: Secondary | ICD-10-CM | POA: Diagnosis not present

## 2017-05-30 DIAGNOSIS — R918 Other nonspecific abnormal finding of lung field: Secondary | ICD-10-CM | POA: Diagnosis not present

## 2017-05-30 DIAGNOSIS — E1165 Type 2 diabetes mellitus with hyperglycemia: Secondary | ICD-10-CM | POA: Diagnosis present

## 2017-05-30 DIAGNOSIS — I10 Essential (primary) hypertension: Secondary | ICD-10-CM | POA: Diagnosis not present

## 2017-05-30 DIAGNOSIS — E876 Hypokalemia: Secondary | ICD-10-CM | POA: Diagnosis not present

## 2017-05-30 DIAGNOSIS — R0902 Hypoxemia: Secondary | ICD-10-CM | POA: Diagnosis not present

## 2017-05-30 DIAGNOSIS — Z7982 Long term (current) use of aspirin: Secondary | ICD-10-CM | POA: Diagnosis not present

## 2017-05-30 DIAGNOSIS — K224 Dyskinesia of esophagus: Secondary | ICD-10-CM | POA: Diagnosis not present

## 2017-05-30 HISTORY — DX: Pneumonia, unspecified organism: J18.9

## 2017-05-30 LAB — CBC WITH DIFFERENTIAL/PLATELET
Basophils Absolute: 0 10*3/uL (ref 0.0–0.1)
Basophils Relative: 0 %
EOS ABS: 0.2 10*3/uL (ref 0.0–0.7)
Eosinophils Relative: 2 %
HCT: 42.1 % (ref 39.0–52.0)
HEMOGLOBIN: 13.6 g/dL (ref 13.0–17.0)
LYMPHS ABS: 1.6 10*3/uL (ref 0.7–4.0)
Lymphocytes Relative: 13 %
MCH: 26.2 pg (ref 26.0–34.0)
MCHC: 32.3 g/dL (ref 30.0–36.0)
MCV: 81 fL (ref 78.0–100.0)
Monocytes Absolute: 1.1 10*3/uL — ABNORMAL HIGH (ref 0.1–1.0)
Monocytes Relative: 9 %
NEUTROS ABS: 9.3 10*3/uL — AB (ref 1.7–7.7)
NEUTROS PCT: 76 %
Platelets: 166 10*3/uL (ref 150–400)
RBC: 5.2 MIL/uL (ref 4.22–5.81)
RDW: 14.9 % (ref 11.5–15.5)
WBC: 12.3 10*3/uL — AB (ref 4.0–10.5)

## 2017-05-30 LAB — BASIC METABOLIC PANEL
Anion gap: 11 (ref 5–15)
BUN: 10 mg/dL (ref 6–20)
CHLORIDE: 99 mmol/L — AB (ref 101–111)
CO2: 29 mmol/L (ref 22–32)
Calcium: 9.2 mg/dL (ref 8.9–10.3)
Creatinine, Ser: 1.22 mg/dL (ref 0.61–1.24)
GFR calc non Af Amer: >60 mL/min (ref >60)
Glucose, Bld: 117 mg/dL — ABNORMAL HIGH (ref 65–99)
POTASSIUM: 3.3 mmol/L — AB (ref 3.5–5.1)
SODIUM: 139 mmol/L (ref 135–145)

## 2017-05-30 LAB — HEPATIC FUNCTION PANEL
ALBUMIN: 4.1 g/dL (ref 3.5–5.0)
ALK PHOS: 73 U/L (ref 38–126)
ALT: 33 U/L (ref 17–63)
AST: 31 U/L (ref 15–41)
Bilirubin, Direct: 0.1 mg/dL (ref 0.1–0.5)
Indirect Bilirubin: 0.8 mg/dL (ref 0.3–0.9)
TOTAL PROTEIN: 7.8 g/dL (ref 6.5–8.1)
Total Bilirubin: 0.9 mg/dL (ref 0.3–1.2)

## 2017-05-30 LAB — LACTIC ACID, PLASMA: Lactic Acid, Venous: 1.1 mmol/L (ref 0.5–1.9)

## 2017-05-30 LAB — TROPONIN I: Troponin I: 0.04 ng/mL (ref <0.03)

## 2017-05-30 LAB — GLUCOSE, CAPILLARY: Glucose-Capillary: 308 mg/dL — ABNORMAL HIGH (ref 65–99)

## 2017-05-30 MED ORDER — MAGNESIUM SULFATE 2 GM/50ML IV SOLN
2.0000 g | Freq: Once | INTRAVENOUS | Status: AC
  Administered 2017-05-30: 2 g via INTRAVENOUS
  Filled 2017-05-30: qty 50

## 2017-05-30 MED ORDER — IPRATROPIUM-ALBUTEROL 0.5-2.5 (3) MG/3ML IN SOLN
3.0000 mL | Freq: Three times a day (TID) | RESPIRATORY_TRACT | Status: DC
  Administered 2017-05-31 (×3): 3 mL via RESPIRATORY_TRACT
  Filled 2017-05-30 (×3): qty 3

## 2017-05-30 MED ORDER — IPRATROPIUM-ALBUTEROL 0.5-2.5 (3) MG/3ML IN SOLN
RESPIRATORY_TRACT | Status: AC
  Filled 2017-05-30: qty 3

## 2017-05-30 MED ORDER — METHYLPREDNISOLONE SODIUM SUCC 125 MG IJ SOLR
125.0000 mg | Freq: Once | INTRAMUSCULAR | Status: AC
  Administered 2017-05-30: 125 mg via INTRAVENOUS
  Filled 2017-05-30: qty 2

## 2017-05-30 MED ORDER — AMLODIPINE BESYLATE 5 MG PO TABS
10.0000 mg | ORAL_TABLET | Freq: Every day | ORAL | Status: DC
  Administered 2017-05-30 – 2017-06-08 (×10): 10 mg via ORAL
  Filled 2017-05-30 (×10): qty 2

## 2017-05-30 MED ORDER — IRBESARTAN 300 MG PO TABS
300.0000 mg | ORAL_TABLET | Freq: Every day | ORAL | Status: DC
  Administered 2017-05-31 – 2017-06-08 (×10): 300 mg via ORAL
  Filled 2017-05-30 (×10): qty 1

## 2017-05-30 MED ORDER — IPRATROPIUM-ALBUTEROL 0.5-2.5 (3) MG/3ML IN SOLN
3.0000 mL | Freq: Two times a day (BID) | RESPIRATORY_TRACT | Status: DC
  Administered 2017-05-30: 3 mL via RESPIRATORY_TRACT
  Filled 2017-05-30: qty 3

## 2017-05-30 MED ORDER — IRBESARTAN 300 MG PO TABS: 300.0000 mg | ORAL_TABLET | Freq: Every day | ORAL | Status: DC

## 2017-05-30 MED ORDER — INSULIN ASPART 100 UNIT/ML ~~LOC~~ SOLN
0.0000 [IU] | Freq: Three times a day (TID) | SUBCUTANEOUS | Status: DC
  Administered 2017-05-31: 5 [IU] via SUBCUTANEOUS
  Administered 2017-05-31: 8 [IU] via SUBCUTANEOUS
  Administered 2017-05-31 – 2017-06-01 (×2): 5 [IU] via SUBCUTANEOUS
  Administered 2017-06-01: 2 [IU] via SUBCUTANEOUS
  Administered 2017-06-02: 15 [IU] via SUBCUTANEOUS
  Administered 2017-06-02 – 2017-06-03 (×3): 11 [IU] via SUBCUTANEOUS
  Administered 2017-06-03: 8 [IU] via SUBCUTANEOUS
  Administered 2017-06-03: 5 [IU] via SUBCUTANEOUS
  Administered 2017-06-04: 11 [IU] via SUBCUTANEOUS
  Administered 2017-06-04: 8 [IU] via SUBCUTANEOUS
  Administered 2017-06-04: 11 [IU] via SUBCUTANEOUS
  Administered 2017-06-05: 5 [IU] via SUBCUTANEOUS
  Administered 2017-06-05: 8 [IU] via SUBCUTANEOUS
  Administered 2017-06-05: 5 [IU] via SUBCUTANEOUS
  Administered 2017-06-06 – 2017-06-07 (×3): 3 [IU] via SUBCUTANEOUS
  Administered 2017-06-08: 8 [IU] via SUBCUTANEOUS
  Administered 2017-06-08: 2 [IU] via SUBCUTANEOUS

## 2017-05-30 MED ORDER — GUAIFENESIN-CODEINE 100-10 MG/5ML PO SOLN
5.0000 mL | Freq: Four times a day (QID) | ORAL | Status: DC
  Administered 2017-05-30 – 2017-06-08 (×31): 5 mL via ORAL
  Filled 2017-05-30 (×32): qty 5

## 2017-05-30 MED ORDER — INFLUENZA VAC SPLIT QUAD 0.5 ML IM SUSY
0.5000 mL | PREFILLED_SYRINGE | INTRAMUSCULAR | Status: AC
  Administered 2017-05-31: 0.5 mL via INTRAMUSCULAR
  Filled 2017-05-30: qty 0.5

## 2017-05-30 MED ORDER — ALBUTEROL SULFATE (2.5 MG/3ML) 0.083% IN NEBU
5.0000 mg | INHALATION_SOLUTION | Freq: Once | RESPIRATORY_TRACT | Status: AC
  Administered 2017-05-30: 5 mg via RESPIRATORY_TRACT
  Filled 2017-05-30: qty 6

## 2017-05-30 MED ORDER — ENOXAPARIN SODIUM 80 MG/0.8ML ~~LOC~~ SOLN
70.0000 mg | SUBCUTANEOUS | Status: DC
  Administered 2017-05-30: 70 mg via SUBCUTANEOUS
  Filled 2017-05-30: qty 0.8

## 2017-05-30 MED ORDER — INSULIN ASPART 100 UNIT/ML ~~LOC~~ SOLN
0.0000 [IU] | Freq: Every day | SUBCUTANEOUS | Status: DC
  Administered 2017-05-31: 3 [IU] via SUBCUTANEOUS
  Administered 2017-05-31: 2 [IU] via SUBCUTANEOUS
  Administered 2017-06-01 – 2017-06-03 (×3): 5 [IU] via SUBCUTANEOUS
  Administered 2017-06-04 – 2017-06-05 (×2): 4 [IU] via SUBCUTANEOUS
  Administered 2017-06-07: 5 [IU] via SUBCUTANEOUS

## 2017-05-30 MED ORDER — DEXTROSE 5 % IV SOLN
500.0000 mg | INTRAVENOUS | Status: DC
  Administered 2017-05-31 – 2017-06-04 (×5): 500 mg via INTRAVENOUS
  Filled 2017-05-30 (×6): qty 500

## 2017-05-30 MED ORDER — INSULIN ASPART 100 UNIT/ML ~~LOC~~ SOLN: 0.0000 [IU] | Freq: Three times a day (TID) | SUBCUTANEOUS | Status: DC

## 2017-05-30 MED ORDER — TAMSULOSIN HCL 0.4 MG PO CAPS
0.4000 mg | ORAL_CAPSULE | Freq: Every morning | ORAL | Status: DC
  Administered 2017-05-31 – 2017-06-08 (×9): 0.4 mg via ORAL
  Filled 2017-05-30 (×9): qty 1

## 2017-05-30 MED ORDER — VALSARTAN-HYDROCHLOROTHIAZIDE 320-12.5 MG PO TABS: 1.0000 | ORAL_TABLET | Freq: Every day | ORAL | Status: DC

## 2017-05-30 MED ORDER — LEVOFLOXACIN IN D5W 500 MG/100ML IV SOLN
500.0000 mg | Freq: Once | INTRAVENOUS | Status: AC
  Administered 2017-05-30: 500 mg via INTRAVENOUS
  Filled 2017-05-30: qty 100

## 2017-05-30 MED ORDER — ASPIRIN EC 81 MG PO TBEC
81.0000 mg | DELAYED_RELEASE_TABLET | Freq: Every day | ORAL | Status: DC
  Administered 2017-05-30 – 2017-06-08 (×10): 81 mg via ORAL
  Filled 2017-05-30 (×10): qty 1

## 2017-05-30 MED ORDER — DEXTROSE 5 % IV SOLN
1.0000 g | INTRAVENOUS | Status: DC
  Administered 2017-05-30 – 2017-05-31 (×2): 1 g via INTRAVENOUS
  Filled 2017-05-30 (×3): qty 10

## 2017-05-30 MED ORDER — IPRATROPIUM-ALBUTEROL 0.5-2.5 (3) MG/3ML IN SOLN
3.0000 mL | Freq: Once | RESPIRATORY_TRACT | Status: AC
  Administered 2017-05-30: 3 mL via RESPIRATORY_TRACT

## 2017-05-30 MED ORDER — INSULIN GLARGINE 100 UNIT/ML ~~LOC~~ SOLN
30.0000 [IU] | Freq: Every day | SUBCUTANEOUS | Status: DC
  Administered 2017-05-30 – 2017-06-01 (×3): 30 [IU] via SUBCUTANEOUS
  Filled 2017-05-30 (×6): qty 0.3

## 2017-05-30 MED ORDER — POTASSIUM CHLORIDE IN NACL 40-0.9 MEQ/L-% IV SOLN
INTRAVENOUS | Status: AC
  Administered 2017-05-30: 100 mL/h via INTRAVENOUS

## 2017-05-30 MED ORDER — ACETAMINOPHEN 325 MG PO TABS
650.0000 mg | ORAL_TABLET | Freq: Four times a day (QID) | ORAL | Status: DC | PRN
  Administered 2017-05-30 – 2017-06-07 (×6): 650 mg via ORAL
  Filled 2017-05-30 (×6): qty 2

## 2017-05-30 MED ORDER — LABETALOL HCL 5 MG/ML IV SOLN
10.0000 mg | INTRAVENOUS | Status: DC | PRN
  Filled 2017-05-30: qty 4

## 2017-05-30 NOTE — ED Notes (Signed)
Waiting for [facility] to bring up Modules before starting Magnesium sulfate

## 2017-05-30 NOTE — ED Notes (Signed)
Respiratory in room giving breathing treatment

## 2017-05-30 NOTE — ED Triage Notes (Signed)
Pt c/o SOB, productive green cough x 5 days. Pt was sent over by PCP after breathing treatment given in office.

## 2017-05-30 NOTE — ED Provider Notes (Signed)
[facility] EMERGENCY DEPARTMENT Provider Note   CSN: [id number] Arrival date & time: 05/30/17  1455     History   Chief Complaint Chief Complaint  Patient presents with  . Shortness of Breath    HPI Vincent Ramos is a 66 y.o. male.  Patient complains of cough congestion fever shortness of breath   The history is provided by the patient.  Shortness of Breath  This is a new problem. The problem occurs continuously.The current episode started more than 2 days ago. The problem has not changed since onset.Associated symptoms include a fever and wheezing. Pertinent negatives include no headaches, no cough, no chest pain, no abdominal pain and no rash. The problem's precipitants include smoke. Risk factors: Unknown.    Past Medical History:  Diagnosis Date  . Arthritis   . Diabetes mellitus   . Enlarged prostate   . Hypertension   . Pneumonia   . Pneumonia   . Shortness of breath     Patient Active Problem List   Diagnosis Date Noted  . Lumbar stenosis 10/07/2015  . CAP (community acquired pneumonia) 07/31/2014  . Diabetes ([place]) 07/31/2014  . Hypertension 07/31/2014  . Obesity 07/31/2014  . Community acquired pneumonia 07/31/2014    Past Surgical History:  Procedure Laterality Date  . CIRCUMCISION    . HYDROCELE EXCISION  10/14/2011   Procedure: HYDROCELECTOMY ADULT;  Surgeon: Malka So, MD;  Location: [facility];  Service: Urology;  Laterality: Right;  . LACERATION REPAIR     left hand  . LUMBAR LAMINECTOMY/DECOMPRESSION MICRODISCECTOMY Right 10/07/2015   Procedure: Right Lumbar four-five ,lumbar five sacral-one Laminectomy;  Surgeon: Leeroy Cha, MD;  Location: [place] [facility];  Service: Neurosurgery;  Laterality: Right;       Home Medications    Prior to Admission medications   Medication Sig Start Date End Date Taking? Authorizing Provider  amLODipine (NORVASC) 10 MG tablet Take 10 mg by mouth daily. 05/22/17  Yes [provider]  aspirin EC  81 MG tablet Take 81 mg by mouth daily.   Yes [provider]  DM-APAP-CPM (FLU HBP) 15-500-2 MG TABS Take 1-2 tablets by mouth daily as needed (for flu symptoms).   Yes [provider]  furosemide (LASIX) 20 MG tablet Take 20 mg by mouth daily. 05/20/17  Yes [provider]  glipiZIDE (GLUCOTROL) 10 MG tablet Take 10 mg by mouth 2 (two) times daily before a meal.   Yes [provider]  Insulin Degludec (TRESIBA FLEXTOUCH) 200 UNIT/ML SOPN Inject 50 Units into the skin at bedtime.    Yes [provider]  metFORMIN (GLUCOPHAGE) 1000 MG tablet Take 1,000 mg by mouth 2 (two) times daily. 05/06/17  Yes [provider]  tamsulosin (FLOMAX) 0.4 MG CAPS capsule Take 1 capsule by mouth every morning.  05/20/17  Yes [provider]  TRULICITY 1.5 HY/8.6VH SOPN Inject 1.5 mg into the skin once a week. Takes on [facility] 04/12/17  Yes [provider]  valsartan-hydrochlorothiazide (DIOVAN-HCT) 320-12.5 MG tablet Take 1 tablet by mouth daily. 05/29/17  Yes [provider]    Family History Family History  Problem Relation Age of Onset  . Diabetes Unknown   . Hypertension Unknown   . CAD Unknown   . Cancer Unknown   . Anesthesia problems Neg Hx   . Hypotension Neg Hx   . Malignant hyperthermia Neg Hx   . Pseudochol deficiency Neg Hx     Social History Social History  Substance  Use Topics  . Smoking status: Never Smoker  . Smokeless tobacco: Never Used  . Alcohol use No     Comment: occassional     Allergies   Victoza [liraglutide]   Review of Systems Review of Systems  Constitutional: Positive for fever. Negative for appetite change and fatigue.  HENT: Negative for congestion, ear discharge and sinus pressure.   Eyes: Negative for discharge.  Respiratory: Positive for shortness of breath and wheezing. Negative for cough.   Cardiovascular: Negative for chest pain.  Gastrointestinal: Negative for  abdominal pain and diarrhea.  Genitourinary: Negative for frequency and hematuria.  Musculoskeletal: Negative for back pain.  Skin: Negative for rash.  Neurological: Negative for seizures and headaches.  Psychiatric/Behavioral: Negative for hallucinations.     Physical Exam Updated Vital Signs BP (!) 157/81 (BP Location: Left Arm)   Pulse (!) 101   Temp 100.2 F (37.9 C)   Resp 20   Ht 5' 6.5" (1.689 m)   Wt 136.1 kg (300 lb)   SpO2 92%   BMI 47.70 kg/m   Physical Exam  Constitutional: He is oriented to person, place, and time. He appears well-developed.  HENT:  Head: Normocephalic.  Eyes: Conjunctivae and EOM are normal. No scleral icterus.  Neck: Neck supple. No thyromegaly present.  Cardiovascular: Normal rate and regular rhythm.  Exam reveals no gallop and no friction rub.   No murmur heard. Pulmonary/Chest: No stridor. He has wheezes. He has no rales. He exhibits no tenderness.  Abdominal: He exhibits no distension. There is no tenderness. There is no rebound.  Musculoskeletal: Normal range of motion. He exhibits no edema.  Lymphadenopathy:    He has no cervical adenopathy.  Neurological: He is oriented to person, place, and time. He exhibits normal muscle tone. Coordination normal.  Skin: No rash noted. No erythema.  Psychiatric: He has a normal mood and affect. His behavior is normal.     ED Treatments / Results  Labs (all labs ordered are listed, but only abnormal results are displayed) Labs Reviewed  CBC WITH DIFFERENTIAL/PLATELET - Abnormal; Notable for the following:       Result Value   WBC 12.3 (*)    Neutro Abs 9.3 (*)    Monocytes Absolute 1.1 (*)    All other components within normal limits  BASIC METABOLIC PANEL - Abnormal; Notable for the following:    Potassium 3.3 (*)    Chloride 99 (*)    Glucose, Bld 117 (*)    All other components within normal limits  CULTURE, BLOOD (ROUTINE X 2)  CULTURE, BLOOD (ROUTINE X 2)  HEPATIC FUNCTION PANEL    LACTIC ACID, PLASMA    EKG  EKG Interpretation None       Radiology Dg Chest 2 View  Result Date: 05/30/2017 CLINICAL DATA:  Shortness of breath.  Productive cough. EXAM: CHEST  2 VIEW COMPARISON:  CT 01/27/2017.  Chest x-ray 01/27/2017. FINDINGS: Cardiomegaly with pulmonary venous congestion. Low lung volumes. Left base infiltrate. Small bilateral pleural effusions cannot be excluded. IMPRESSION: 1. Low lung volumes with basilar atelectasis. Left base infiltrate. Pneumonia cannot be excluded. 2. Cardiomegaly with mild pulmonary venous congestion small bilateral pleural effusions. Electronically Signed   By: Marcello Moores  Register   On: 05/30/2017 16:37    Procedures Procedures (including critical care time)  Medications Ordered in ED Medications  levofloxacin (LEVAQUIN) IVPB 500 mg (not administered)  albuterol (PROVENTIL) (2.5 MG/3ML) 0.083% nebulizer solution 5 mg (5 mg Nebulization Given 05/30/17 1529)  methylPREDNISolone  sodium succinate (SOLU-MEDROL) 125 mg/2 mL injection 125 mg (125 mg Intravenous Given 05/30/17 1550)  magnesium sulfate IVPB 2 g 50 mL (0 g Intravenous Stopped 05/30/17 1745)  ipratropium-albuterol (DUONEB) 0.5-2.5 (3) MG/3ML nebulizer solution 3 mL (3 mLs Nebulization Given 05/30/17 1534)     Initial Impression / Assessment and Plan / ED Course  I have reviewed the triage vital signs and the nursing notes.  Pertinent labs & imaging results that were available during my care of the patient were reviewed by me and considered in my medical decision making (see chart for details).     Patient with community acquired pneumonia and hypoxia.  He will be admitted to the hospital for antibiotics  Final Clinical Impressions(s) / ED Diagnoses   Final diagnoses:  Community acquired pneumonia, unspecified laterality    New Prescriptions New Prescriptions   No medications on file     Milton Ferguson, MD 05/30/17 [phone]

## 2017-05-30 NOTE — Progress Notes (Signed)
CRITICAL VALUE ALERT  Critical Value:  Troponin 0.04  Date & Time Notied:  05/30/17 2110  Provider Notified: Baltazar Najjar, NP  Orders Received/Actions taken: Awaiting response

## 2017-05-30 NOTE — ED Notes (Signed)
Pt would like to eat.

## 2017-05-30 NOTE — H&P (Addendum)
History and Physical    Vincent Ramos WEX:[id number] DOB: [birth year] DOA: 05/30/2017  PCP: [name], MD   Patient coming from: Home  Chief Complaint: SOB, cough, chest pain.  HPI: Vincent Ramos is a 66 y.o. male with medical history significant for diabetes mellitus, hypertension, who presented to the ED with complaints of shortness of breath cough that started 4 days ago. Cough is productive of greenish sputum. Patient also reports chest pain on both sides of his chest and abdominal pain from coughing. Patient endorses sweating chills and fever.  Patient endorses bilateral lower extremity swelling or one-month duration, without pain or redness.  ED Course: Temperature 100.6, pulse 118, elevated blood pressure 139/109, O2 sats 86% on room air improved to 96% on 2L nasal cannula. WBC elevated at 12.3. Potassium mildly low at 3.3. Lactic acid normal at 1.1. Chest x-ray showed a left base infiltrate. Patient was started on IV Levaquin for community-acquired pneumonia. On arrival to the ED patient was also wheezing he was giving 125 mg of Solu-Medrol, magnesium sulfate, and albuterol treatments with improvement in symptoms.  Review of Systems: As per HPI otherwise 10 point review of systems negative.  Past Medical History:  Diagnosis Date  . Arthritis   . Diabetes mellitus   . Enlarged prostate   . Hypertension   . Pneumonia   . Pneumonia   . Shortness of breath     Past Surgical History:  Procedure Laterality Date  . CIRCUMCISION    . HYDROCELE EXCISION  10/14/2011   Procedure: HYDROCELECTOMY ADULT;  Surgeon: Malka So, MD;  Location: [facility];  Service: Urology;  Laterality: Right;  . LACERATION REPAIR     left hand  . LUMBAR LAMINECTOMY/DECOMPRESSION MICRODISCECTOMY Right 10/07/2015   Procedure: Right Lumbar four-five ,lumbar five sacral-one Laminectomy;  Surgeon: Leeroy Cha, MD;  Location: [facility];  Service: Neurosurgery;  Laterality: Right;    reports that he  has never smoked. He has never used smokeless tobacco. He reports that he does not drink alcohol or use drugs.  Allergies  Allergen Reactions  . Victoza [Liraglutide] Itching and Rash    Family History  Problem Relation Age of Onset  . Diabetes Unknown   . Hypertension Unknown   . CAD Unknown   . Cancer Unknown   . Anesthesia problems Neg Hx   . Hypotension Neg Hx   . Malignant hyperthermia Neg Hx   . Pseudochol deficiency Neg Hx    Prior to Admission medications   Medication Sig Start Date End Date Taking? Authorizing Provider  amLODipine (NORVASC) 10 MG tablet Take 10 mg by mouth daily. 05/22/17  Yes [provider]  aspirin EC 81 MG tablet Take 81 mg by mouth daily.   Yes [provider]  DM-APAP-CPM (FLU HBP) 15-500-2 MG TABS Take 1-2 tablets by mouth daily as needed (for flu symptoms).   Yes [provider]  furosemide (LASIX) 20 MG tablet Take 20 mg by mouth daily. 05/20/17  Yes [provider]  glipiZIDE (GLUCOTROL) 10 MG tablet Take 10 mg by mouth 2 (two) times daily before a meal.   Yes [provider]  Insulin Degludec (TRESIBA FLEXTOUCH) 200 UNIT/ML SOPN Inject 50 Units into the skin at bedtime.    Yes [provider]  metFORMIN (GLUCOPHAGE) 1000 MG tablet Take 1,000 mg by mouth 2 (two) times daily. 05/06/17  Yes [provider]  tamsulosin (FLOMAX) 0.4 MG CAPS capsule Take 1 capsule by mouth every  morning.  05/20/17  Yes [provider]  TRULICITY 1.5 ZW/2.5EN SOPN Inject 1.5 mg into the skin once a week. Takes on [facility] 04/12/17  Yes [provider]  valsartan-hydrochlorothiazide (DIOVAN-HCT) 320-12.5 MG tablet Take 1 tablet by mouth daily. 05/29/17  Yes [provider]    Physical Exam: Vitals:   05/30/17 1630 05/30/17 1700 05/30/17 1805 05/30/17 1836  BP: 108/80 (!) 153/80 (!) 157/81 (!) 163/98  Pulse: (!) 119 (!) 108 (!) 101 (!) 101  Resp: (!) 27 12 20  (!) 27  Temp:    100.2 F (37.9 C)   TempSrc:      SpO2: 94% 96% 92% 97%  Weight:      Height:        Constitutional: NAD, calm, comfortable, diaphoretic Vitals:   05/30/17 1630 05/30/17 1700 05/30/17 1805 05/30/17 1836  BP: 108/80 (!) 153/80 (!) 157/81 (!) 163/98  Pulse: (!) 119 (!) 108 (!) 101 (!) 101  Resp: (!) 27 12 20  (!) 27  Temp:   100.2 F (37.9 C)   TempSrc:      SpO2: 94% 96% 92% 97%  Weight:      Height:       Eyes: PERRL, lids and conjunctivae normal ENMT: Mucous membranes are dry, Posterior pharynx clear of any exudate or lesions.Normal dentition.  Neck: normal, supple, no masses, no thyromegaly Respiratory: clear to auscultation bilaterally, no wheezing, no crackles. Normal respiratory effort. No accessory muscle use.  Cardiovascular: Regular rate and rhythm, no murmurs / rubs / gallops. 1+ pitting pedal edema bilaterally. 2+ pedal pulses.  Abdomen: no tenderness, no masses palpated.  Distended patient says this is normal for him, No hepatosplenomegaly. Bowel sounds positive.  Musculoskeletal: no clubbing / cyanosis. No joint deformity upper and lower extremities. Good ROM, no contractures. Normal muscle tone.  Skin: no rashes, lesions, ulcers. No induration Neurologic: CN 2-12 grossly intact. Sensation intact, DTR normal. Strength 5/5 in all 4.  Psychiatric: Normal judgment and insight. Alert and oriented x 3. Normal mood.   Labs on Admission: I have personally reviewed following labs and imaging studies  CBC:  Recent Labs Lab 05/30/17 1519  WBC 12.3*  NEUTROABS 9.3*  HGB 13.6  HCT 42.1  MCV 81.0  PLT 277   Basic Metabolic Panel:  Recent Labs Lab 05/30/17 1519  NA 139  K 3.3*  CL 99*  CO2 29  GLUCOSE 117*  BUN 10  CREATININE 1.22  CALCIUM 9.2   GFR: Estimated Creatinine Clearance: 80.8 mL/min (by C-G formula based on SCr of 1.22 mg/dL). Liver Function Tests:  Recent Labs Lab 05/30/17 1530  AST 31  ALT 33  ALKPHOS 73  BILITOT 0.9  PROT 7.8    ALBUMIN 4.1   Radiological Exams on Admission: Dg Chest 2 View  Result Date: 05/30/2017 CLINICAL DATA:  Shortness of breath.  Productive cough. EXAM: CHEST  2 VIEW COMPARISON:  CT 01/27/2017.  Chest x-ray 01/27/2017. FINDINGS: Cardiomegaly with pulmonary venous congestion. Low lung volumes. Left base infiltrate. Small bilateral pleural effusions cannot be excluded. IMPRESSION: 1. Low lung volumes with basilar atelectasis. Left base infiltrate. Pneumonia cannot be excluded. 2. Cardiomegaly with mild pulmonary venous congestion small bilateral pleural effusions. Electronically Signed   By: Marcello Moores  Register   On: 05/30/2017 16:37    EKG: None.  Assessment/Plan Principal Problem:   CAP (community acquired pneumonia) Active Problems:   Diabetes ([place])   Hypertension   Obesity  Community-acquired pneumonia- WBC 12.6, fever. Tachycardia. Diaphoretic. Hypoxia , Requiring  2 L O2. Pt meets sepsis criteria. Lactic acid reassuring, but stable blood pressure. Cxray- LLL infiltrate. IV Levaquin started in ED. - Switch to IV ceftriaxone and IV azithromycin for Coverage -Follow-up blood cultures drawn in ED  - Urine legionella - IVF- with diaphoresis tachycardia, dry mucous membrane. - Mucolytics - Bronchodilators - No history of COPD no wheezing on my exam will hold off on further steroids.  Chest pain- bilateral lower ribs, likely related to CAP symptoms, coughing.  - Trend troponins X3- 0.04 - EKG - Doubt need for further workup if trops flat, and unchanged EKG  Diabetes- cbg 117>>308, likely from steroids. Home medications is insulin degludec, Glipizide, trulicity, metformin. Last hemoglobin A1c 09/2015- 9.3.  - Continue home insulin at reduced dose to 30 units daily - SSI- S  Hypertension- blood pressure elevated 157/81. Home medications Norvasc 10, valsartan-HCTZ, also on tamsulosin. - Continue home valsartan as irbesartan - Continue home Norvasc - Hold HCTZ while hydrating - PRN  labetalol  DVT prophylaxis: Lovenox Code Status: Full  Family Communication:At bedside Disposition Plan: 2-3 days, home Consults called: None Admission status: Inpatient telemetry  Bethena Roys MD [facility] Pager [phone]  If 11PM-7AM, please contact night-coverage [email or web address] Password [email or web address]  05/30/2017, 7:44 PM

## 2017-05-30 NOTE — ED Notes (Signed)
RT called

## 2017-05-31 LAB — BASIC METABOLIC PANEL
Anion gap: 10 (ref 5–15)
BUN: 18 mg/dL (ref 6–20)
CALCIUM: 8.6 mg/dL — AB (ref 8.9–10.3)
CHLORIDE: 100 mmol/L — AB (ref 101–111)
CO2: 29 mmol/L (ref 22–32)
CREATININE: 1.1 mg/dL (ref 0.61–1.24)
GFR calc Af Amer: >60 mL/min (ref >60)
GFR calc non Af Amer: >60 mL/min (ref >60)
GLUCOSE: 286 mg/dL — AB (ref 65–99)
Potassium: 4.1 mmol/L (ref 3.5–5.1)
Sodium: 139 mmol/L (ref 135–145)

## 2017-05-31 LAB — CBC
HCT: 40.9 % (ref 39.0–52.0)
Hemoglobin: 13.4 g/dL (ref 13.0–17.0)
MCH: 26.5 pg (ref 26.0–34.0)
MCHC: 32.8 g/dL (ref 30.0–36.0)
MCV: 80.8 fL (ref 78.0–100.0)
PLATELETS: 139 10*3/uL — AB (ref 150–400)
RBC: 5.06 MIL/uL (ref 4.22–5.81)
RDW: 15 % (ref 11.5–15.5)
WBC: 14.3 10*3/uL — ABNORMAL HIGH (ref 4.0–10.5)

## 2017-05-31 LAB — GLUCOSE, CAPILLARY
GLUCOSE-CAPILLARY: 246 mg/dL — AB (ref 65–99)
GLUCOSE-CAPILLARY: 234 mg/dL — AB (ref 65–99)
GLUCOSE-CAPILLARY: 270 mg/dL — AB (ref 65–99)
GLUCOSE-CAPILLARY: 237 mg/dL — AB (ref 65–99)
Glucose-Capillary: 278 mg/dL — ABNORMAL HIGH (ref 65–99)

## 2017-05-31 LAB — TROPONIN I
Troponin I: 0.04 ng/mL (ref <0.03)
Troponin I: 0.03 ng/mL (ref <0.03)

## 2017-05-31 MED ORDER — ORAL CARE MOUTH RINSE
15.0000 mL | Freq: Two times a day (BID) | OROMUCOSAL | Status: DC
  Administered 2017-05-31 – 2017-06-06 (×13): 15 mL via OROMUCOSAL

## 2017-05-31 MED ORDER — ENOXAPARIN SODIUM 60 MG/0.6ML ~~LOC~~ SOLN
60.0000 mg | SUBCUTANEOUS | Status: DC
  Administered 2017-05-31 – 2017-06-07 (×8): 60 mg via SUBCUTANEOUS
  Filled 2017-05-31 (×8): qty 0.6

## 2017-05-31 MED ORDER — IPRATROPIUM-ALBUTEROL 0.5-2.5 (3) MG/3ML IN SOLN: 3.0000 mL | Freq: Two times a day (BID) | RESPIRATORY_TRACT | Status: DC

## 2017-05-31 NOTE — Progress Notes (Signed)
PROGRESS NOTE    Vincent Ramos  VOJ:[id number] DOB: [birth year] DOA: 05/30/2017 PCP: [name], MD    Brief Narrative:  66 year old male with a history of hypertension and diabetes, presented to the emergency room with complaints of shortness of breath chest pain.  Found to have evidence of pneumonia and started on intravenous antibiotics.  Overall respiratory status appears to be improving.   Assessment & Plan:   Principal Problem:   CAP (community acquired pneumonia) Active Problems:   Diabetes ([place])   Hypertension   Obesity   1. Community-acquired pneumonia.  Chest x-ray shows left lower lobe infiltrate.  Currently on Rocephin and azithromycin.  Clinically, he appears to be improving.  Continue current treatments.  Continue on bronchodilators.  He does not have any documented history of COPD and does not have any history of tobacco use.  He has had shortness of breath which has been chronically progressive.  He plans to follow-up with a pulmonologist later this week. 2. Chest pain.  Likely pleuritic from pneumonia.  He has ruled out for ACS with negative cardiac markers. 3. Diabetes.  Hyperglycemia likely related to steroids received in the emergency room.  Blood sugars have been stable.  Continue sliding scale insulin. 4. Hypertension.  Blood pressures have been stable.  Continue Norvasc, valsartan and hydrochlorothiazide. 5. Acute respiratory failure with hypoxia.  Likely related to pneumonia.  Weaning off oxygen as tolerated.   DVT prophylaxis: Lovenox Code Status: Full code Family Communication: Discussed with son at the bedside Disposition Plan: Discharge home once improved   Consultants:     Procedures:     Antimicrobials:   Rocephin 10/30 >  Azithromycin 10/30 >   Subjective: Feeling better today.  Cough is minimally productive.  Feels overall breathing is improving.  Objective: Vitals:   05/31/17 1436 05/31/17 1745 05/31/17 2036 05/31/17 2039    BP:   (!) 174/84   Pulse:   82   Resp:   20   Temp:   98.7 F (37.1 C)   TempSrc:   Oral   SpO2: 96% 95% 97% 95%  Weight:      Height:        Intake/Output Summary (Last 24 hours) at 05/31/17 2120 Last data filed at 05/31/17 0700  Gross per 24 hour  Intake             1906 ml  Output              500 ml  Net             1406 ml   Filed Weights   05/30/17 1507 05/30/17 2015  Weight: 136.1 kg (300 lb) 123.5 kg (272 lb 3.2 oz)    Examination:  General exam: Appears calm and comfortable  Respiratory system: Clear to auscultation. Respiratory effort normal. Cardiovascular system: S1 & S2 heard, RRR. No JVD, murmurs, rubs, gallops or clicks. No pedal edema. Gastrointestinal system: Abdomen is nondistended, soft and nontender. No organomegaly or masses felt. Normal bowel sounds heard. Central nervous system: Alert and oriented. No focal neurological deficits. Extremities: Symmetric 5 x 5 power. Skin: No rashes, lesions or ulcers Psychiatry: Judgement and insight appear normal. Mood & affect appropriate.     Data Reviewed: I have personally reviewed following labs and imaging studies  CBC:  Recent Labs Lab 05/30/17 1519 05/31/17 0747  WBC 12.3* 14.3*  NEUTROABS 9.3*  --   HGB 13.6 13.4  HCT 42.1 40.9  MCV 81.0 80.8  PLT 166 696*   Basic Metabolic Panel:  Recent Labs Lab 05/30/17 1519 05/31/17 0747  NA 139 139  K 3.3* 4.1  CL 99* 100*  CO2 29 29  GLUCOSE 117* 286*  BUN 10 18  CREATININE 1.22 1.10  CALCIUM 9.2 8.6*   GFR: Estimated Creatinine Clearance: 84.8 mL/min (by C-G formula based on SCr of 1.1 mg/dL). Liver Function Tests:  Recent Labs Lab 05/30/17 1530  AST 31  ALT 33  ALKPHOS 73  BILITOT 0.9  PROT 7.8  ALBUMIN 4.1   No results for input(s): LIPASE, AMYLASE in the last 168 hours. No results for input(s): AMMONIA in the last 168 hours. Coagulation Profile: No results for input(s): INR, PROTIME in the last 168 hours. Cardiac  Enzymes:  Recent Labs Lab 05/30/17 1959 05/31/17 0137 05/31/17 0747  TROPONINI 0.04* 0.04* 0.03*   BNP (last 3 results) No results for input(s): PROBNP in the last 8760 hours. HbA1C: No results for input(s): HGBA1C in the last 72 hours. CBG:  Recent Labs Lab 05/31/17 0024 05/31/17 0715 05/31/17 1109 05/31/17 1639 05/31/17 2027  GLUCAP 278* 246* 234* 270* 237*   Lipid Profile: No results for input(s): CHOL, HDL, LDLCALC, TRIG, CHOLHDL, LDLDIRECT in the last 72 hours. Thyroid Function Tests: No results for input(s): TSH, T4TOTAL, FREET4, T3FREE, THYROIDAB in the last 72 hours. Anemia Panel: No results for input(s): VITAMINB12, FOLATE, FERRITIN, TIBC, IRON, RETICCTPCT in the last 72 hours. Sepsis Labs:  Recent Labs Lab 05/30/17 1823  LATICACIDVEN 1.1    Recent Results (from the past 240 hour(s))  Blood culture (routine x 2)     Status: None (Preliminary result)   Collection Time: 05/30/17  6:23 PM  Result Value Ref Range Status   Specimen Description BLOOD RIGHT FOREARM  Final   Special Requests   Final    BOTTLES DRAWN AEROBIC AND ANAEROBIC Blood Culture adequate volume   Culture NO GROWTH < 24 HOURS  Final   Report Status PENDING  Incomplete  Blood culture (routine x 2)     Status: None (Preliminary result)   Collection Time: 05/30/17  6:23 PM  Result Value Ref Range Status   Specimen Description BLOOD RIGHT HAND  Final   Special Requests   Final    BOTTLES DRAWN AEROBIC AND ANAEROBIC Blood Culture adequate volume   Culture NO GROWTH < 24 HOURS  Final   Report Status PENDING  Incomplete         Radiology Studies: Dg Chest 2 View  Result Date: 05/30/2017 CLINICAL DATA:  Shortness of breath.  Productive cough. EXAM: CHEST  2 VIEW COMPARISON:  CT 01/27/2017.  Chest x-ray 01/27/2017. FINDINGS: Cardiomegaly with pulmonary venous congestion. Low lung volumes. Left base infiltrate. Small bilateral pleural effusions cannot be excluded. IMPRESSION: 1. Low lung  volumes with basilar atelectasis. Left base infiltrate. Pneumonia cannot be excluded. 2. Cardiomegaly with mild pulmonary venous congestion small bilateral pleural effusions. Electronically Signed   By: [place]   On: 05/30/2017 16:37        Scheduled Meds: . amLODipine  10 mg Oral Daily  . aspirin EC  81 mg Oral Daily  . enoxaparin (LOVENOX) injection  60 mg Subcutaneous Q24H  . guaiFENesin-codeine  5 mL Oral Q6H  . insulin aspart  0-15 Units Subcutaneous TID WC  . insulin aspart  0-5 Units Subcutaneous QHS  . insulin glargine  30 Units Subcutaneous QHS  . [START ON 06/01/2017] ipratropium-albuterol  3 mL Nebulization BID  . irbesartan  300 mg Oral Daily  . mouth rinse  15 mL Mouth Rinse BID  . tamsulosin  0.4 mg Oral q morning - 10a   Continuous Infusions: . azithromycin Stopped (05/31/17 1829)  . cefTRIAXone (ROCEPHIN)  IV 1 g (05/31/17 2020)     LOS: 1 day    Time spent: 30mins    MEMON,JEHANZEB, MD [facility] Pager [phone]  If 7PM-7AM, please contact night-coverage [email or web address] Password [email or web address] 05/31/2017, 9:20 PM

## 2017-05-31 NOTE — Care Management Note (Addendum)
Case Management Note  Patient Details  Name: Vincent Ramos MRN: [id number] Date of Birth: 06-20-1951  Subjective/Objective:   Adm with CAP. From home with wife. Has cane, RW, BSC pta, but doesn't use these items. Has PCP, drives himself to appointments, and reports no issues affording medications. Acutely on oxygen.                 Action/Plan: Anticipate weaning to room air. No CM need communicated. Reports he has an appt. With an pulmonologist Friday.  Expected Discharge Date:   06/01/2017               Expected Discharge Plan:  Home/Self Care  In-House Referral:     Discharge planning Services  CM Consult  Post Acute Care Choice:  NA Choice offered to:  NA  DME Arranged:    DME Agency:     HH Arranged:    HH Agency:     Status of Service:  In process, will continue to follow  If discussed at Long Length of Stay Meetings, dates discussed:    Additional Comments:  Chalsea Darko, Chauncey Reading, RN 05/31/2017, 1:46 PM

## 2017-06-01 ENCOUNTER — Inpatient Hospital Stay (HOSPITAL_COMMUNITY): Payer: PPO

## 2017-06-01 DIAGNOSIS — J9601 Acute respiratory failure with hypoxia: Secondary | ICD-10-CM

## 2017-06-01 HISTORY — DX: Acute respiratory failure with hypoxia: J96.01

## 2017-06-01 LAB — GLUCOSE, CAPILLARY
GLUCOSE-CAPILLARY: 231 mg/dL — AB (ref 65–99)
Glucose-Capillary: 193 mg/dL — ABNORMAL HIGH (ref 65–99)
Glucose-Capillary: 141 mg/dL — ABNORMAL HIGH (ref 65–99)
Glucose-Capillary: 385 mg/dL — ABNORMAL HIGH (ref 65–99)

## 2017-06-01 LAB — BLOOD GAS, ARTERIAL
Acid-Base Excess: 2.6 mmol/L — ABNORMAL HIGH (ref 0.0–2.0)
BICARBONATE: 25.6 mmol/L (ref 20.0–28.0)
Delivery systems: POSITIVE
Drawn by: 382351
EXPIRATORY PAP: 6
FIO2: 60
INSPIRATORY PAP: 14
O2 Saturation: 97.1 %
PO2 ART: 111 mmHg — AB (ref 83.0–108.0)
Patient temperature: 37
pCO2 arterial: 56.8 mmHg — ABNORMAL HIGH (ref 32.0–48.0)
pH, Arterial: 7.317 — ABNORMAL LOW (ref 7.350–7.450)

## 2017-06-01 LAB — BRAIN NATRIURETIC PEPTIDE: B Natriuretic Peptide: 154 pg/mL — ABNORMAL HIGH (ref 0.0–100.0)

## 2017-06-01 LAB — MRSA PCR SCREENING: MRSA BY PCR: NEGATIVE

## 2017-06-01 LAB — TROPONIN I: TROPONIN I: 0.07 ng/mL — AB (ref <0.03)

## 2017-06-01 LAB — HIV ANTIBODY (ROUTINE TESTING W REFLEX): HIV Screen 4th Generation wRfx: NONREACTIVE

## 2017-06-01 MED ORDER — METHYLPREDNISOLONE SODIUM SUCC 125 MG IJ SOLR
60.0000 mg | Freq: Two times a day (BID) | INTRAMUSCULAR | Status: DC
  Administered 2017-06-01 – 2017-06-05 (×9): 60 mg via INTRAVENOUS
  Filled 2017-06-01 (×9): qty 2

## 2017-06-01 MED ORDER — IPRATROPIUM-ALBUTEROL 0.5-2.5 (3) MG/3ML IN SOLN
3.0000 mL | RESPIRATORY_TRACT | Status: DC
  Administered 2017-06-01 – 2017-06-02 (×7): 3 mL via RESPIRATORY_TRACT
  Filled 2017-06-01 (×8): qty 3

## 2017-06-01 MED ORDER — FUROSEMIDE 10 MG/ML IJ SOLN
40.0000 mg | Freq: Once | INTRAMUSCULAR | Status: AC
  Administered 2017-06-01: 40 mg via INTRAVENOUS
  Filled 2017-06-01: qty 4

## 2017-06-01 MED ORDER — BUDESONIDE 0.25 MG/2ML IN SUSP
0.2500 mg | Freq: Two times a day (BID) | RESPIRATORY_TRACT | Status: DC
  Administered 2017-06-01 – 2017-06-08 (×15): 0.25 mg via RESPIRATORY_TRACT
  Filled 2017-06-01 (×15): qty 2

## 2017-06-01 MED ORDER — HYDRALAZINE HCL 20 MG/ML IJ SOLN
10.0000 mg | Freq: Once | INTRAMUSCULAR | Status: AC
  Administered 2017-06-01: 10 mg via INTRAVENOUS
  Filled 2017-06-01: qty 1

## 2017-06-01 MED ORDER — SODIUM CHLORIDE 0.9 % IV SOLN
3.0000 g | Freq: Four times a day (QID) | INTRAVENOUS | Status: DC
  Administered 2017-06-01 – 2017-06-08 (×26): 3 g via INTRAVENOUS
  Filled 2017-06-01 (×32): qty 3

## 2017-06-01 MED ORDER — HYDRALAZINE HCL 20 MG/ML IJ SOLN
10.0000 mg | INTRAMUSCULAR | Status: DC | PRN
  Administered 2017-06-02 – 2017-06-03 (×6): 10 mg via INTRAVENOUS
  Filled 2017-06-01 (×6): qty 1

## 2017-06-01 MED ORDER — ALBUTEROL SULFATE (2.5 MG/3ML) 0.083% IN NEBU
2.5000 mg | INHALATION_SOLUTION | RESPIRATORY_TRACT | Status: DC | PRN
  Administered 2017-06-01 (×2): 2.5 mg via RESPIRATORY_TRACT
  Filled 2017-06-01 (×2): qty 3

## 2017-06-01 MED ORDER — FAMOTIDINE 20 MG PO TABS
20.0000 mg | ORAL_TABLET | Freq: Two times a day (BID) | ORAL | Status: DC | PRN
  Administered 2017-06-01 – 2017-06-07 (×3): 20 mg via ORAL
  Filled 2017-06-01 (×3): qty 1

## 2017-06-01 NOTE — Progress Notes (Signed)
Inpatient Diabetes Program Recommendations  AACE/ADA: New Consensus Statement on Inpatient Glycemic Control (2015)  Target Ranges:  Prepandial:   less than 140 mg/dL      Peak postprandial:   less than 180 mg/dL (1-2 hours)      Critically ill patients:  140 - 180 mg/dL   Results for Vincent Ramos, Vincent Ramos (MRN [id number]) as of 06/01/2017 10:50  Ref. Range 05/31/2017 07:15 05/31/2017 11:09 05/31/2017 16:39 05/31/2017 20:27 06/01/2017 10:19  Glucose-Capillary Latest Ref Range: 65 - 99 mg/dL 246 (H) 234 (H) 270 (H) 237 (H) 231 (H)  Results for Vincent Ramos, Vincent Ramos (MRN [id number]) as of 06/01/2017 10:50  Ref. Range 09/29/2015 10:13  Hemoglobin A1C Latest Ref Range: 4.8 - 5.6 % 9.3 (H)   Review of Glycemic Control  Diabetes history: DM2 Outpatient Diabetes medications: Tresiba 50 units QHS, Trulicity 1.5 mg Qweek, Metformin 1000 mg BID, Glipizide 10 mg BID Current orders for Inpatient glycemic control: Lantus 30 units QHS, Novolog 0-15 units TID with meals, Novolog 0-5 units QHS  Inpatient Diabetes Program Recommendations:  Insulin - Basal: Please consider increasing Lantus to 35 units QHS. Insulin - Meal Coverage: Please consider ordering Novolog 3 units TID with meals for meal coverage if patient eats at least 50% of meals. A1C: Please consider ordering an A1C to evaluate glycemic control over the past 2-3 months.  Thanks, Barnie Alderman, RN, MSN, CDE Diabetes Coordinator Inpatient Diabetes Program [phone] (Team Pager from 8am to 5pm)

## 2017-06-01 NOTE — Progress Notes (Signed)
At approximately 0208, patient called this RN for coffee. Patient could barely speak this and stating that he was cold. This RN entered patient's room to hit sitting in the chair with labored, tachypneic breathing. Patient's whole body tremoring, c/o chills. VS per flowsheet. At approximately 0216 rapid response called and Non-rebreather mask applied with 15L O2. Dr. Hal Hope in room to assess patient and place orders. Valentino Nose, Agricultural consultant and [facility] RN was in room also. Lasix 40 mg IV and Hydralazine 10 mg IV given. BiPap applied. Patient transferred to ICU via bed by this RN, NT, and RRT. Report given to ICU RN. Clothing, shoes, glasses, and phone sent with patient. Patient requests that wife not get notified until after 0500 d/t her working.

## 2017-06-01 NOTE — Progress Notes (Addendum)
Responded to rapid response. Pt on 100% NRB mask. Pt is in obvious distress. Hospitalist at bedside along with RNs. Pt placed on BIPAP and ABG drawn. Nebulizer given inline as well. Pt tolerating BIPAP fairly well

## 2017-06-01 NOTE — Progress Notes (Signed)
Patient off BIPAP at this time. Will place patient back on around 11pm-12am during his next scheduled treatment. Patient told to call RT if he needs it before then.

## 2017-06-01 NOTE — Progress Notes (Signed)
This RN has called patient's wife two times to notify of patient's transfer. No answer and no available voicemail. Wife did just now call this RN and states that she is coming to see patient.

## 2017-06-01 NOTE — Progress Notes (Signed)
PROGRESS NOTE    Vincent Ramos  ZOX:[id number] DOB: June 15, 1951 DOA: 05/30/2017 PCP: [name], MD    Brief Narrative:  66 year old male with a history of hypertension and diabetes, presented to the emergency room with complaints of shortness of breath chest pain.  Found to have evidence of pneumonia and started on intravenous antibiotics.  On 11/1, respiratory status decompensated and he required transfer to stepdown for bipap therapy. Pulmonology consulted.   Assessment & Plan:   Principal Problem:   CAP (community acquired pneumonia) Active Problems:   Diabetes ([place])   Hypertension   Obesity   1. Community-acquired pneumonia.  Chest x-ray shows left lower lobe infiltrate. He was initially started on Rocephin and azithromycin.  Overnight, his respiratory status decompensated and repeat chest xray shows worsening pneumonia on left side. Patient was also noted to be having a fever overnight. Unclear if he had an aspiration event that led to decompensation. Will change ceftriaxone to unasyn for better anaerobic coverage. Continue on bronchodilators.  He does not have any documented history of COPD and does not have any history of tobacco use. Since air entry appears to be diminished, will start on IV steroids for now. He has had shortness of breath which has been chronically progressive.  Will request pulmonology input. BNP is only minimally elevated in 150s and he did not have significant improvement after receiving lasix. 2. Chest pain.  Likely pleuritic from pneumonia.  He has ruled out for ACS with negative cardiac markers. 3. Diabetes.  Hyperglycemia likely related to steroids.  Blood sugars have been stable.  Continue sliding scale insulin. 4. Hypertension.  Blood pressures have been stable.  Continue Norvasc, valsartan and hydrochlorothiazide. 5. Acute respiratory failure with hypoxia.  Likely related to pneumonia.  Currently on bipap. Weaning off oxygen as tolerated.   DVT  prophylaxis: Lovenox Code Status: Full code Family Communication: Discussed with family at the bedside Disposition Plan: Discharge home once improved   Consultants:     Procedures:     Antimicrobials:   Rocephin 10/30 > 11/1  Azithromycin 10/30 >  Unasyn 11/1>   Subjective: Patient decompensated overnight. He became increasingly short of breath and had to be transferred to stepdown for Bipap therapy.  Objective: Vitals:   06/01/17 1118 06/01/17 1236 06/01/17 1237 06/01/17 1626  BP:      Pulse:   98   Resp:   (!) 28   Temp: 98.9 F (37.2 C)   99.8 F (37.7 C)  TempSrc: Oral   Oral  SpO2:  99% 99%   Weight:      Height:        Intake/Output Summary (Last 24 hours) at 06/01/17 1833 Last data filed at 06/01/17 0518  Gross per 24 hour  Intake             1145 ml  Output             2775 ml  Net            -1630 ml   Filed Weights   05/30/17 1507 05/30/17 2015 06/01/17 0310  Weight: 136.1 kg (300 lb) 123.5 kg (272 lb 3.2 oz) 125.7 kg (277 lb 1.9 oz)    Examination:  General exam: Appears calm and comfortable  Respiratory system: Clear to auscultation. Increased respiratory effort. Cardiovascular system: S1 & S2 heard, RRR. No JVD, murmurs, rubs, gallops or clicks. No pedal edema. Gastrointestinal system: Abdomen is nondistended, soft and nontender. No organomegaly or masses felt.  Normal bowel sounds heard. Central nervous system: Alert and oriented. No focal neurological deficits. Extremities: Symmetric 5 x 5 power. Skin: No rashes, lesions or ulcers Psychiatry: Judgement and insight appear normal. Mood & affect appropriate.     Data Reviewed: I have personally reviewed following labs and imaging studies  CBC:  Recent Labs Lab 05/30/17 1519 05/31/17 0747  WBC 12.3* 14.3*  NEUTROABS 9.3*  --   HGB 13.6 13.4  HCT 42.1 40.9  MCV 81.0 80.8  PLT 166 825*   Basic Metabolic Panel:  Recent Labs Lab 05/30/17 1519 05/31/17 0747  NA 139 139  K  3.3* 4.1  CL 99* 100*  CO2 29 29  GLUCOSE 117* 286*  BUN 10 18  CREATININE 1.22 1.10  CALCIUM 9.2 8.6*   GFR: Estimated Creatinine Clearance: 86.3 mL/min (by C-G formula based on SCr of 1.1 mg/dL). Liver Function Tests:  Recent Labs Lab 05/30/17 1530  AST 31  ALT 33  ALKPHOS 73  BILITOT 0.9  PROT 7.8  ALBUMIN 4.1   No results for input(s): LIPASE, AMYLASE in the last 168 hours. No results for input(s): AMMONIA in the last 168 hours. Coagulation Profile: No results for input(s): INR, PROTIME in the last 168 hours. Cardiac Enzymes:  Recent Labs Lab 05/30/17 1959 05/31/17 0137 05/31/17 0747 06/01/17 0402  TROPONINI 0.04* 0.04* 0.03* 0.07*   BNP (last 3 results) No results for input(s): PROBNP in the last 8760 hours. HbA1C: No results for input(s): HGBA1C in the last 72 hours. CBG:  Recent Labs Lab 05/31/17 1639 05/31/17 2027 06/01/17 1019 06/01/17 1117 06/01/17 1554  GLUCAP 270* 237* 231* 193* 141*   Lipid Profile: No results for input(s): CHOL, HDL, LDLCALC, TRIG, CHOLHDL, LDLDIRECT in the last 72 hours. Thyroid Function Tests: No results for input(s): TSH, T4TOTAL, FREET4, T3FREE, THYROIDAB in the last 72 hours. Anemia Panel: No results for input(s): VITAMINB12, FOLATE, FERRITIN, TIBC, IRON, RETICCTPCT in the last 72 hours. Sepsis Labs:  Recent Labs Lab 05/30/17 1823  LATICACIDVEN 1.1    Recent Results (from the past 240 hour(s))  Blood culture (routine x 2)     Status: None (Preliminary result)   Collection Time: 05/30/17  6:23 PM  Result Value Ref Range Status   Specimen Description BLOOD RIGHT FOREARM  Final   Special Requests   Final    BOTTLES DRAWN AEROBIC AND ANAEROBIC Blood Culture adequate volume   Culture NO GROWTH 2 DAYS  Final   Report Status PENDING  Incomplete  Blood culture (routine x 2)     Status: None (Preliminary result)   Collection Time: 05/30/17  6:23 PM  Result Value Ref Range Status   Specimen Description BLOOD RIGHT  HAND  Final   Special Requests   Final    BOTTLES DRAWN AEROBIC AND ANAEROBIC Blood Culture adequate volume   Culture NO GROWTH 2 DAYS  Final   Report Status PENDING  Incomplete  MRSA PCR Screening     Status: None   Collection Time: 06/01/17  3:31 AM  Result Value Ref Range Status   MRSA by PCR NEGATIVE NEGATIVE Final    Comment:        The GeneXpert MRSA Assay (FDA approved for NASAL specimens only), is one component of a comprehensive MRSA colonization surveillance program. It is not intended to diagnose MRSA infection nor to guide or monitor treatment for MRSA infections.          Radiology Studies: Dg Chest Port 1 View  Result Date:  06/01/2017 CLINICAL DATA:  Progressive shortness of breath. Recent diagnosis of pneumonia. EXAM: PORTABLE CHEST 1 VIEW COMPARISON:  Radiograph 05/30/2017 FINDINGS: Multifocal patchy opacities throughout the left lung have increased from prior exam. Progressive peribronchial thickening. Cardiomegaly is similar. Left pleural effusion versus subpleural fat. No pneumothorax. IMPRESSION: Progressive peribronchial thickening suspicious for pulmonary edema. Progressive patchy opacities throughout the left lung, considerations include asymmetric more confluent pulmonary edema, pneumonia, aspiration or ARDS. Electronically Signed   By: Jeb Levering M.D.   On: 06/01/2017 03:28        Scheduled Meds: . amLODipine  10 mg Oral Daily  . aspirin EC  81 mg Oral Daily  . budesonide (PULMICORT) nebulizer solution  0.25 mg Nebulization BID  . enoxaparin (LOVENOX) injection  60 mg Subcutaneous Q24H  . guaiFENesin-codeine  5 mL Oral Q6H  . insulin aspart  0-15 Units Subcutaneous TID WC  . insulin aspart  0-5 Units Subcutaneous QHS  . insulin glargine  30 Units Subcutaneous QHS  . ipratropium-albuterol  3 mL Nebulization Q4H  . irbesartan  300 mg Oral Daily  . mouth rinse  15 mL Mouth Rinse BID  . methylPREDNISolone (SOLU-MEDROL) injection  60 mg  Intravenous Q12H  . tamsulosin  0.4 mg Oral q morning - 10a   Continuous Infusions: . ampicillin-sulbactam (UNASYN) IV Stopped (06/01/17 1415)  . azithromycin 500 mg (06/01/17 1742)     LOS: 2 days    Time spent: 21mins    Mionna Advincula, MD [facility] Pager [phone]  If 7PM-7AM, please contact night-coverage [email or web address] Password [email or web address] 06/01/2017, 6:33 PM

## 2017-06-01 NOTE — Progress Notes (Signed)
Called to rapid response, RN, AC, MD and Rt at bedside. Pt sitting in chair in resp distress on 100% NRB. RN report initial bp 240/148 and next bp 254/197. MD order for bipap, lasix 40, hydralazine 10 , neb tx and transfer to icu.

## 2017-06-02 ENCOUNTER — Encounter (HOSPITAL_COMMUNITY): Payer: Self-pay

## 2017-06-02 ENCOUNTER — Institutional Professional Consult (permissible substitution): Payer: Self-pay | Admitting: Pulmonary Disease

## 2017-06-02 ENCOUNTER — Inpatient Hospital Stay (HOSPITAL_COMMUNITY): Payer: PPO

## 2017-06-02 LAB — LEGIONELLA PNEUMOPHILA SEROGP 1 UR AG: L. PNEUMOPHILA SEROGP 1 UR AG: NEGATIVE

## 2017-06-02 LAB — BASIC METABOLIC PANEL
Anion gap: 11 (ref 5–15)
BUN: 18 mg/dL (ref 6–20)
CHLORIDE: 101 mmol/L (ref 101–111)
CO2: 30 mmol/L (ref 22–32)
CREATININE: 1.1 mg/dL (ref 0.61–1.24)
Calcium: 8.7 mg/dL — ABNORMAL LOW (ref 8.9–10.3)
GFR calc non Af Amer: >60 mL/min (ref >60)
Glucose, Bld: 284 mg/dL — ABNORMAL HIGH (ref 65–99)
POTASSIUM: 4 mmol/L (ref 3.5–5.1)
Sodium: 142 mmol/L (ref 135–145)

## 2017-06-02 LAB — CBC
HEMATOCRIT: 42.8 % (ref 39.0–52.0)
Hemoglobin: 13.6 g/dL (ref 13.0–17.0)
MCH: 26.3 pg (ref 26.0–34.0)
MCHC: 31.8 g/dL (ref 30.0–36.0)
MCV: 82.6 fL (ref 78.0–100.0)
PLATELETS: 169 10*3/uL (ref 150–400)
RBC: 5.18 MIL/uL (ref 4.22–5.81)
RDW: 15.4 % (ref 11.5–15.5)
WBC: 13.1 10*3/uL — AB (ref 4.0–10.5)

## 2017-06-02 LAB — GLUCOSE, CAPILLARY
GLUCOSE-CAPILLARY: 356 mg/dL — AB (ref 65–99)
GLUCOSE-CAPILLARY: 333 mg/dL — AB (ref 65–99)
Glucose-Capillary: 333 mg/dL — ABNORMAL HIGH (ref 65–99)
Glucose-Capillary: 386 mg/dL — ABNORMAL HIGH (ref 65–99)

## 2017-06-02 MED ORDER — IOPAMIDOL (ISOVUE-370) INJECTION 76%
75.0000 mL | Freq: Once | INTRAVENOUS | Status: AC | PRN
  Administered 2017-06-02: 75 mL via INTRAVENOUS

## 2017-06-02 MED ORDER — IPRATROPIUM-ALBUTEROL 0.5-2.5 (3) MG/3ML IN SOLN
3.0000 mL | Freq: Four times a day (QID) | RESPIRATORY_TRACT | Status: DC
  Administered 2017-06-02 – 2017-06-03 (×4): 3 mL via RESPIRATORY_TRACT
  Filled 2017-06-02 (×4): qty 3

## 2017-06-02 MED ORDER — INSULIN GLARGINE 100 UNIT/ML ~~LOC~~ SOLN
40.0000 [IU] | Freq: Every day | SUBCUTANEOUS | Status: DC
Start: 2017-06-02 — End: 2017-06-04
  Administered 2017-06-02 – 2017-06-04 (×2): 40 [IU] via SUBCUTANEOUS
  Filled 2017-06-02 (×3): qty 0.4

## 2017-06-02 MED ORDER — INSULIN ASPART 100 UNIT/ML ~~LOC~~ SOLN
5.0000 [IU] | Freq: Three times a day (TID) | SUBCUTANEOUS | Status: DC
  Administered 2017-06-02 – 2017-06-04 (×6): 5 [IU] via SUBCUTANEOUS

## 2017-06-02 NOTE — Progress Notes (Signed)
PROGRESS NOTE    Vincent Ramos  QMV:[id number] DOB: Oct 27, 1950 DOA: 05/30/2017 PCP: [name], MD    Brief Narrative:  66 year old male with a history of hypertension and diabetes, presented to the emergency room with complaints of shortness of breath chest pain.  Found to have evidence of pneumonia and started on intravenous antibiotics.  On 11/1, respiratory status decompensated and he required transfer to stepdown for bipap therapy. Pulmonology consulted.   Assessment & Plan:   Principal Problem:   CAP (community acquired pneumonia) Active Problems:   Diabetes ([place])   Hypertension   Obesity   Acute respiratory failure with hypoxia ([place])   1. Community-acquired pneumonia.  Chest x-ray shows left lower lobe infiltrate. He was initially started on Rocephin and azithromycin.  Shortly after admission, his respiratory status decompensated and repeat chest xray showed worsening pneumonia on left side. Patient was also noted to be having a fever. Unclear if he had an aspiration event that led to decompensation.  Antibiotics were changed from ceftriaxone to unasyn for better anaerobic coverage. Continue on bronchodilators.  He does not have any documented history of COPD and does not have any history of tobacco use.  Since respiratory status was worsening, he was started on intravenous steroids on 11/1 and appears to be clinically improving. BNP is only minimally elevated in 150s and he did not have significant improvement after receiving lasix.  Pulmonology consulted and CT chest has been ordered. 2. Chest pain.  Likely pleuritic from pneumonia.  He has ruled out for ACS with negative cardiac markers. 3. Diabetes.  Hyperglycemia likely related to steroids.  Blood sugars are trending up.  Lantus and NovoLog adjusted.  Continue sliding scale insulin. 4. Hypertension.  Blood pressures have been stable.  Continue Norvasc, valsartan and hydrochlorothiazide. 5. Acute respiratory failure with  hypoxia.  Likely related to pneumonia.  Intermittently requiring BiPAP. Weaning off oxygen as tolerated.   DVT prophylaxis: Lovenox Code Status: Full code Family Communication: No family present Disposition Plan: Discharge home once improved   Consultants:   Pulmonology  Procedures:     Antimicrobials:   Rocephin 10/30 > 11/1  Azithromycin 10/30 >  Unasyn 11/1>   Subjective: Patient did require BiPAP overnight.  Overall, he feels that his breathing is improving today.  He does endorse having coughing spells after eating or drinking.  Objective: Vitals:   06/02/17 0450 06/02/17 0500 06/02/17 0805 06/02/17 1246  BP:  (!) 178/84    Pulse:  (!) 110    Resp:  19    Temp:   98.7 F (37.1 C)   TempSrc:   Oral   SpO2: 97% (!) 89%  93%  Weight:  123.1 kg (271 lb 6.2 oz)    Height:        Intake/Output Summary (Last 24 hours) at 06/02/17 1435 Last data filed at 06/01/17 2100  Gross per 24 hour  Intake                0 ml  Output              900 ml  Net             -900 ml   Filed Weights   05/30/17 2015 06/01/17 0310 06/02/17 0500  Weight: 123.5 kg (272 lb 3.2 oz) 125.7 kg (277 lb 1.9 oz) 123.1 kg (271 lb 6.2 oz)    Examination:  General exam: Appears calm and comfortable  Respiratory system: Clear to auscultation. Increased respiratory  effort. Cardiovascular system: S1 & S2 heard, RRR. No JVD, murmurs, rubs, gallops or clicks. No pedal edema. Gastrointestinal system: Abdomen is nondistended, soft and nontender. No organomegaly or masses felt. Normal bowel sounds heard. Central nervous system: Alert and oriented. No focal neurological deficits. Extremities: Symmetric 5 x 5 power. Skin: No rashes, lesions or ulcers Psychiatry: Judgement and insight appear normal. Mood & affect appropriate.     Data Reviewed: I have personally reviewed following labs and imaging studies  CBC:  Recent Labs Lab 05/30/17 1519 05/31/17 0747 06/02/17 0405  WBC 12.3* 14.3*  13.1*  NEUTROABS 9.3*  --   --   HGB 13.6 13.4 13.6  HCT 42.1 40.9 42.8  MCV 81.0 80.8 82.6  PLT 166 139* 188   Basic Metabolic Panel:  Recent Labs Lab 05/30/17 1519 05/31/17 0747 06/02/17 0405  NA 139 139 142  K 3.3* 4.1 4.0  CL 99* 100* 101  CO2 29 29 30   GLUCOSE 117* 286* 284*  BUN 10 18 18   CREATININE 1.22 1.10 1.10  CALCIUM 9.2 8.6* 8.7*   GFR: Estimated Creatinine Clearance: 85.3 mL/min (by C-G formula based on SCr of 1.1 mg/dL). Liver Function Tests:  Recent Labs Lab 05/30/17 1530  AST 31  ALT 33  ALKPHOS 73  BILITOT 0.9  PROT 7.8  ALBUMIN 4.1   No results for input(s): LIPASE, AMYLASE in the last 168 hours. No results for input(s): AMMONIA in the last 168 hours. Coagulation Profile: No results for input(s): INR, PROTIME in the last 168 hours. Cardiac Enzymes:  Recent Labs Lab 05/30/17 1959 05/31/17 0137 05/31/17 0747 06/01/17 0402  TROPONINI 0.04* 0.04* 0.03* 0.07*   BNP (last 3 results) No results for input(s): PROBNP in the last 8760 hours. HbA1C: No results for input(s): HGBA1C in the last 72 hours. CBG:  Recent Labs Lab 06/01/17 1117 06/01/17 1554 06/01/17 2054 06/02/17 0744 06/02/17 1111  GLUCAP 193* 141* 385* 333* 356*   Lipid Profile: No results for input(s): CHOL, HDL, LDLCALC, TRIG, CHOLHDL, LDLDIRECT in the last 72 hours. Thyroid Function Tests: No results for input(s): TSH, T4TOTAL, FREET4, T3FREE, THYROIDAB in the last 72 hours. Anemia Panel: No results for input(s): VITAMINB12, FOLATE, FERRITIN, TIBC, IRON, RETICCTPCT in the last 72 hours. Sepsis Labs:  Recent Labs Lab 05/30/17 1823  LATICACIDVEN 1.1    Recent Results (from the past 240 hour(s))  Blood culture (routine x 2)     Status: None (Preliminary result)   Collection Time: 05/30/17  6:23 PM  Result Value Ref Range Status   Specimen Description BLOOD RIGHT FOREARM  Final   Special Requests   Final    BOTTLES DRAWN AEROBIC AND ANAEROBIC Blood Culture  adequate volume   Culture NO GROWTH 3 DAYS  Final   Report Status PENDING  Incomplete  Blood culture (routine x 2)     Status: None (Preliminary result)   Collection Time: 05/30/17  6:23 PM  Result Value Ref Range Status   Specimen Description BLOOD RIGHT HAND  Final   Special Requests   Final    BOTTLES DRAWN AEROBIC AND ANAEROBIC Blood Culture adequate volume   Culture NO GROWTH 3 DAYS  Final   Report Status PENDING  Incomplete  MRSA PCR Screening     Status: None   Collection Time: 06/01/17  3:31 AM  Result Value Ref Range Status   MRSA by PCR NEGATIVE NEGATIVE Final    Comment:        The GeneXpert MRSA Assay (FDA  approved for NASAL specimens only), is one component of a comprehensive MRSA colonization surveillance program. It is not intended to diagnose MRSA infection nor to guide or monitor treatment for MRSA infections.          Radiology Studies: Ct Chest W Contrast  Result Date: 06/02/2017 CLINICAL DATA:  Pneumonia, chest pain EXAM: CT CHEST WITH CONTRAST TECHNIQUE: Multidetector CT imaging of the chest was performed during intravenous contrast administration. CONTRAST:  75 cc Isovue 370 IV COMPARISON:  01/27/2017 FINDINGS: Cardiovascular: Heart is borderline in size. Aorta is normal caliber. Mediastinum/Nodes: Mildly enlarged mediastinal lymph nodes. These have increased in size since prior study. Right paratracheal lymph node has a short axis diameter of 11 mm. Inferior right paratracheal lymph node has a short axis diameter of 13 mm. Mild bilateral hilar adenopathy. No axillary adenopathy. Lungs/Pleura: Extensive ground-glass opacities noted in the upper lobes bilaterally, left greater than right as well as superior segment of the left lower lobe. Findings most likely reflect multifocal pneumonia. No effusions. Upper Abdomen: Imaging into the upper abdomen shows no acute findings. Musculoskeletal: Chest wall soft tissues are unremarkable. No acute bony abnormality.  Degenerative spurring throughout the thoracic spine. IMPRESSION: Extensive ground-glass opacities in the upper lobes, left greater than right as well as the superior segment of the left lower lobe concerning for multifocal pneumonia. Given the upper lobe predominance, pulmonary edema is felt less likely but also possible. Bilateral hilar and axillary mild adenopathy, likely reactive. Electronically Signed   By: Rolm Baptise M.D.   On: 06/02/2017 11:59   Dg Chest Port 1 View  Result Date: 06/01/2017 CLINICAL DATA:  Progressive shortness of breath. Recent diagnosis of pneumonia. EXAM: PORTABLE CHEST 1 VIEW COMPARISON:  Radiograph 05/30/2017 FINDINGS: Multifocal patchy opacities throughout the left lung have increased from prior exam. Progressive peribronchial thickening. Cardiomegaly is similar. Left pleural effusion versus subpleural fat. No pneumothorax. IMPRESSION: Progressive peribronchial thickening suspicious for pulmonary edema. Progressive patchy opacities throughout the left lung, considerations include asymmetric more confluent pulmonary edema, pneumonia, aspiration or ARDS. Electronically Signed   By: Jeb Levering M.D.   On: 06/01/2017 03:28        Scheduled Meds: . amLODipine  10 mg Oral Daily  . aspirin EC  81 mg Oral Daily  . budesonide (PULMICORT) nebulizer solution  0.25 mg Nebulization BID  . enoxaparin (LOVENOX) injection  60 mg Subcutaneous Q24H  . guaiFENesin-codeine  5 mL Oral Q6H  . insulin aspart  0-15 Units Subcutaneous TID WC  . insulin aspart  0-5 Units Subcutaneous QHS  . insulin glargine  30 Units Subcutaneous QHS  . ipratropium-albuterol  3 mL Nebulization Q6H  . irbesartan  300 mg Oral Daily  . mouth rinse  15 mL Mouth Rinse BID  . methylPREDNISolone (SOLU-MEDROL) injection  60 mg Intravenous Q12H  . tamsulosin  0.4 mg Oral q morning - 10a   Continuous Infusions: . ampicillin-sulbactam (UNASYN) IV Stopped (06/02/17 1120)  . azithromycin 500 mg (06/01/17  1742)     LOS: 3 days    Time spent: 60mins    Babs Dabbs, MD [facility] Pager [phone]  If 7PM-7AM, please contact night-coverage [email or web address] Password [email or web address] 06/02/2017, 2:35 PM

## 2017-06-02 NOTE — Progress Notes (Signed)
Patient off BiPAP will try to let stay off tonight. On 4 lpm nasal cannula.

## 2017-06-02 NOTE — Consult Note (Signed)
Consult requested by: [facility] Consult requested for: Respiratory failure  HPI: This is a 66 year old who came to the hospital with pneumonia.  He says he had been having more shortness of breath for the last month or 2 and it got much worse about 4 days ago.  He started having a cough and brought up green sputum.  He is having some chest and abdominal pain from coughing.  He feels like he had fever at home.  He did not have chills.  He has had some swelling of his legs.  He was started on treatment for pneumonia and acutely worsened yesterday and was transferred to stepdown.  Chest x-ray yesterday which I personally reviewed looks much worse.  He says he feels better this morning.  He is coughing up sputum.  He did cough up some blood mixed with sputum but now it is more sputum without blood still.  He does not have a history of lung disease except he has had recurrent pneumonia.  No family history of lung disease.  He did not have asthma growing up.  He has minimal smoking history he did have some exposure while he worked in a [employer].  Past Medical History:  Diagnosis Date  . Arthritis   . Diabetes mellitus   . Enlarged prostate   . Hypertension   . Pneumonia   . Pneumonia   . Shortness of breath      Family History  Problem Relation Age of Onset  . Diabetes Unknown   . Hypertension Unknown   . CAD Unknown   . Cancer Unknown   . Anesthesia problems Neg Hx   . Hypotension Neg Hx   . Malignant hyperthermia Neg Hx   . Pseudochol deficiency Neg Hx    no COPD  Social History   Social History  . Marital status: Married    Spouse name: N/A  . Number of children: N/A  . Years of education: N/A   Social History Main Topics  . Smoking status: Never Smoker  . Smokeless tobacco: Never Used  . Alcohol use No     Comment: occassional  . Drug use: No  . Sexual activity: Yes    Birth control/ protection: None   Other Topics Concern  . None   Social History Narrative   . None     ROS: Except as mentioned 10 point review of systems is negative    Objective: Vital signs in last 24 hours: Temp:  [98.6 F (37 C)-99.9 F (37.7 C)] 98.7 F (37.1 C) (11/02 0805) Pulse Rate:  [83-110] 110 (11/02 0500) Resp:  [17-28] 19 (11/02 0500) BP: (136-178)/(70-91) 178/84 (11/02 0500) SpO2:  [89 %-99 %] 89 % (11/02 0500) FiO2 (%):  [40 %] 40 % (11/01 2353) Weight:  [123.1 kg (271 lb 6.2 oz)] 123.1 kg (271 lb 6.2 oz) (11/02 0500) Weight change: -2.6 kg (-5 lb 11.7 oz) Last BM Date: 05/31/17  Intake/Output from previous day: 11/01 0701 - 11/02 0700 In: -  Out: 900 [Urine:900]  PHYSICAL EXAM Constitutional: He is awake and alert in no acute distress.  He is obese.  Eyes: Pupils react.  EOMI.  Ears nose mouth and throat: Mucous membranes are moist.  Hearing is grossly normal.  Throat is clear.  Cardiovascular: His heart is regular with normal heart sounds.  Respiratory: His respiratory effort is increased.  He has bilateral rales.  Gastrointestinal: His abdomen is soft obese without masses.  Skin: Warm and dry.  Musculoskeletal:  Normal strength.  Neurological: No focal abnormalities.  Psychiatric: Normal mood and affect  Lab Results: Basic Metabolic Panel:  Recent Labs  05/31/17 0747 06/02/17 0405  NA 139 142  K 4.1 4.0  CL 100* 101  CO2 29 30  GLUCOSE 286* 284*  BUN 18 18  CREATININE 1.10 1.10  CALCIUM 8.6* 8.7*   Liver Function Tests:  Recent Labs  05/30/17 1530  AST 31  ALT 33  ALKPHOS 73  BILITOT 0.9  PROT 7.8  ALBUMIN 4.1   No results for input(s): LIPASE, AMYLASE in the last 72 hours. No results for input(s): AMMONIA in the last 72 hours. CBC:  Recent Labs  05/30/17 1519 05/31/17 0747 06/02/17 0405  WBC 12.3* 14.3* 13.1*  NEUTROABS 9.3*  --   --   HGB 13.6 13.4 13.6  HCT 42.1 40.9 42.8  MCV 81.0 80.8 82.6  PLT 166 139* 169   Cardiac Enzymes:  Recent Labs  05/31/17 0137 05/31/17 0747 06/01/17 0402  TROPONINI 0.04*  0.03* 0.07*   BNP: No results for input(s): PROBNP in the last 72 hours. D-Dimer: No results for input(s): DDIMER in the last 72 hours. CBG:  Recent Labs  05/31/17 2027 06/01/17 1019 06/01/17 1117 06/01/17 1554 06/01/17 2054 06/02/17 0744  GLUCAP 237* 231* 193* 141* 385* 333*   Hemoglobin A1C: No results for input(s): HGBA1C in the last 72 hours. Fasting Lipid Panel: No results for input(s): CHOL, HDL, LDLCALC, TRIG, CHOLHDL, LDLDIRECT in the last 72 hours. Thyroid Function Tests: No results for input(s): TSH, T4TOTAL, FREET4, T3FREE, THYROIDAB in the last 72 hours. Anemia Panel: No results for input(s): VITAMINB12, FOLATE, FERRITIN, TIBC, IRON, RETICCTPCT in the last 72 hours. Coagulation: No results for input(s): LABPROT, INR in the last 72 hours. Urine Drug Screen: Drugs of Abuse  No results found for: LABOPIA, COCAINSCRNUR, LABBENZ, AMPHETMU, THCU, LABBARB  Alcohol Level: No results for input(s): ETH in the last 72 hours. Urinalysis: No results for input(s): COLORURINE, LABSPEC, PHURINE, GLUCOSEU, HGBUR, BILIRUBINUR, KETONESUR, PROTEINUR, UROBILINOGEN, NITRITE, LEUKOCYTESUR in the last 72 hours.  Invalid input(s): APPERANCEUR Misc. Labs:   ABGS:  Recent Labs  06/01/17 0229  PHART 7.317*  PO2ART 111*  HCO3 25.6     MICROBIOLOGY: Recent Results (from the past 240 hour(s))  Blood culture (routine x 2)     Status: None (Preliminary result)   Collection Time: 05/30/17  6:23 PM  Result Value Ref Range Status   Specimen Description BLOOD RIGHT FOREARM  Final   Special Requests   Final    BOTTLES DRAWN AEROBIC AND ANAEROBIC Blood Culture adequate volume   Culture NO GROWTH 3 DAYS  Final   Report Status PENDING  Incomplete  Blood culture (routine x 2)     Status: None (Preliminary result)   Collection Time: 05/30/17  6:23 PM  Result Value Ref Range Status   Specimen Description BLOOD RIGHT HAND  Final   Special Requests   Final    BOTTLES DRAWN AEROBIC AND  ANAEROBIC Blood Culture adequate volume   Culture NO GROWTH 3 DAYS  Final   Report Status PENDING  Incomplete  MRSA PCR Screening     Status: None   Collection Time: 06/01/17  3:31 AM  Result Value Ref Range Status   MRSA by PCR NEGATIVE NEGATIVE Final    Comment:        The GeneXpert MRSA Assay (FDA approved for NASAL specimens only), is one component of a comprehensive MRSA colonization surveillance program. It is  not intended to diagnose MRSA infection nor to guide or monitor treatment for MRSA infections.     Studies/Results: Dg Chest Port 1 View  Result Date: 06/01/2017 CLINICAL DATA:  Progressive shortness of breath. Recent diagnosis of pneumonia. EXAM: PORTABLE CHEST 1 VIEW COMPARISON:  Radiograph 05/30/2017 FINDINGS: Multifocal patchy opacities throughout the left lung have increased from prior exam. Progressive peribronchial thickening. Cardiomegaly is similar. Left pleural effusion versus subpleural fat. No pneumothorax. IMPRESSION: Progressive peribronchial thickening suspicious for pulmonary edema. Progressive patchy opacities throughout the left lung, considerations include asymmetric more confluent pulmonary edema, pneumonia, aspiration or ARDS. Electronically Signed   By: Jeb Levering M.D.   On: 06/01/2017 03:28    Medications:  Prior to Admission:  Prescriptions Prior to Admission  Medication Sig Dispense Refill Last Dose  . amLODipine (NORVASC) 10 MG tablet Take 10 mg by mouth daily.   Past Month at Unknown time  . aspirin EC 81 MG tablet Take 81 mg by mouth daily.   Past Month at Unknown time  . DM-APAP-CPM (FLU HBP) 15-500-2 MG TABS Take 1-2 tablets by mouth daily as needed (for flu symptoms).   Past Week at Unknown time  . furosemide (LASIX) 20 MG tablet Take 20 mg by mouth daily.   05/30/2017 at Unknown time  . glipiZIDE (GLUCOTROL) 10 MG tablet Take 10 mg by mouth 2 (two) times daily before a meal.   05/30/2017 at Unknown time  . Insulin Degludec (TRESIBA  FLEXTOUCH) 200 UNIT/ML SOPN Inject 50 Units into the skin at bedtime.    05/30/2017 at Unknown time  . metFORMIN (GLUCOPHAGE) 1000 MG tablet Take 1,000 mg by mouth 2 (two) times daily.   05/30/2017 at Unknown time  . tamsulosin (FLOMAX) 0.4 MG CAPS capsule Take 1 capsule by mouth every morning.    05/30/2017 at Unknown time  . TRULICITY 1.5 WU/9.8JX SOPN Inject 1.5 mg into the skin once a week. Takes on Mondays/Tuesdays   05/22/2017 at Unknown time  . valsartan-hydrochlorothiazide (DIOVAN-HCT) 320-12.5 MG tablet Take 1 tablet by mouth daily.   05/30/2017 at Unknown time   Scheduled: . amLODipine  10 mg Oral Daily  . aspirin EC  81 mg Oral Daily  . budesonide (PULMICORT) nebulizer solution  0.25 mg Nebulization BID  . enoxaparin (LOVENOX) injection  60 mg Subcutaneous Q24H  . guaiFENesin-codeine  5 mL Oral Q6H  . insulin aspart  0-15 Units Subcutaneous TID WC  . insulin aspart  0-5 Units Subcutaneous QHS  . insulin glargine  30 Units Subcutaneous QHS  . ipratropium-albuterol  3 mL Nebulization Q4H  . irbesartan  300 mg Oral Daily  . mouth rinse  15 mL Mouth Rinse BID  . methylPREDNISolone (SOLU-MEDROL) injection  60 mg Intravenous Q12H  . tamsulosin  0.4 mg Oral q morning - 10a   Continuous: . ampicillin-sulbactam (UNASYN) IV Stopped (06/02/17 0233)  . azithromycin 500 mg (06/01/17 1742)   BJY:[id number], albuterol, famotidine, hydrALAZINE, labetalol  Assesment: He is admitted with community-acquired pneumonia.  He has acute hypoxic respiratory failure.  His pneumonia appears to be multilobar on chest x-ray done yesterday.  He has had previous episodes of pneumonia. Principal Problem:   CAP (community acquired pneumonia) Active Problems:   Diabetes ([place])   Hypertension   Obesity   Acute respiratory failure with hypoxia ([place])    Plan: Continue current treatments.  CT of chest.  Add flutter valve.  Check immunoglobulins.    LOS: 3 days   Tamberlyn Midgley L 06/02/2017, 8:38  AM

## 2017-06-02 NOTE — Progress Notes (Signed)
Inpatient Diabetes Program Recommendations  AACE/ADA: New Consensus Statement on Inpatient Glycemic Control (2015)  Target Ranges:  Prepandial:   less than 140 mg/dL      Peak postprandial:   less than 180 mg/dL (1-2 hours)      Critically ill patients:  140 - 180 mg/dL   Lab Results  Component Value Date   GLUCAP 356 (H) 06/02/2017   HGBA1C 9.3 (H) 09/29/2015    Review of Glycemic Control Results for Vincent Ramos, Vincent Ramos (MRN [id number]) as of 06/02/2017 13:51  Ref. Range 06/01/2017 11:17 06/01/2017 15:54 06/01/2017 20:54 06/02/2017 07:44 06/02/2017 11:11  Glucose-Capillary Latest Ref Range: 65 - 99 mg/dL 193 (H) 141 (H) 385 (H) 333 (H) 356 (H)   Inpatient Diabetes Program Recommendations: Insulin - Basal: Please consider increasing Lantus to 35 units QHS. Insulin - Meal Coverage: Please consider ordering Novolog 3 units TID with meals for meal coverage if patient eats at least 50% of meals.   Thank you, Nani Gasser. Aaryn Parrilla, RN, MSN, CDE  Diabetes Coordinator Inpatient Glycemic Control Team Team Pager [phone] (8am-5pm) 06/02/2017 1:51 PM

## 2017-06-02 NOTE — Plan of Care (Signed)
Problem: Safety: Goal: Ability to remain free from injury will improve Outcome: Completed/Met Date Met: 06/02/17 Patient alert and oriented, able to make needs known. Uses call bell when assistance is needed, call bell within reach, bedside table & personal items within reach. Bed in lowest position, skid proof socks on.

## 2017-06-03 LAB — GLUCOSE, CAPILLARY
GLUCOSE-CAPILLARY: 292 mg/dL — AB (ref 65–99)
GLUCOSE-CAPILLARY: 320 mg/dL — AB (ref 65–99)
Glucose-Capillary: 234 mg/dL — ABNORMAL HIGH (ref 65–99)
Glucose-Capillary: 367 mg/dL — ABNORMAL HIGH (ref 65–99)

## 2017-06-03 MED ORDER — FUROSEMIDE 20 MG PO TABS
20.0000 mg | ORAL_TABLET | Freq: Every day | ORAL | Status: DC
  Administered 2017-06-03 – 2017-06-08 (×6): 20 mg via ORAL
  Filled 2017-06-03 (×6): qty 1

## 2017-06-03 MED ORDER — HYDRALAZINE HCL 25 MG PO TABS
25.0000 mg | ORAL_TABLET | Freq: Three times a day (TID) | ORAL | Status: DC
  Administered 2017-06-03 – 2017-06-08 (×14): 25 mg via ORAL
  Filled 2017-06-03 (×14): qty 1

## 2017-06-03 NOTE — Evaluation (Signed)
Clinical/Bedside Swallow Evaluation Patient Details  Name: Vincent Ramos MRN: [id number] Date of Birth: 29-May-1951  Today's Date: 06/03/2017 Time: SLP Start Time (ACUTE ONLY): 0900 SLP Stop Time (ACUTE ONLY): 0926 SLP Time Calculation (min) (ACUTE ONLY): 26 min  Past Medical History:  Past Medical History:  Diagnosis Date  . Arthritis   . Diabetes mellitus   . Enlarged prostate   . Hypertension   . Pneumonia   . Pneumonia   . Shortness of breath    Past Surgical History:  Past Surgical History:  Procedure Laterality Date  . CIRCUMCISION    . HYDROCELE EXCISION  10/14/2011   Procedure: HYDROCELECTOMY ADULT;  Surgeon: Malka So, MD;  Location: [facility];  Service: Urology;  Laterality: Right;  . LACERATION REPAIR     left hand  . LUMBAR LAMINECTOMY/DECOMPRESSION MICRODISCECTOMY Right 10/07/2015   Procedure: Right Lumbar four-five ,lumbar five sacral-one Laminectomy;  Surgeon: Leeroy Cha, MD;  Location: [place] [facility];  Service: Neurosurgery;  Laterality: Right;   HPI:  66 year old male with a history of hypertension and diabetes, presented to the emergency room with complaints of shortness of breath chest pain. Found to have evidence of pneumonia and started on intravenous antibiotics. On 11/1, respiratory status decompensated and he required transfer to stepdown for bipap therapy. Pulmonology consulted. CT scan did not show bronchiectasis and shows groundglass opacity. BSE ordered due to h/o PNA with possible aspiration component.    Assessment / Plan / Recommendation Clinical Impression  Clinical swallow evaluation completed at bedside with spouse present. Oral motor examination is unremarkable, as is bedside examination with thin water via cup and straw, self presented puree and graham crackers. Pt without overt signs of symptoms of aspiration at bedside. Pt does not endorse any coughing with po, however he does report occasional globus sensation when drinking water and points  to mid chest. Pt also states that he has a "nervous stomach" and takes Nexium once per day. He previously walked and/or worked out at the senior center, but stopped about two months ago. Do not suspect prandial aspiration during meals; consider esophageal component (?dysmotility) and BPE on Monday or as an outpatient. Pt's PCP is Dr. Wende Neighbors. Aspiration and reflux precautions were reviewed with Pt. Continue regular diet with thin liquids with reflux precautions and consider BPE given repeated bouts of PNA and Pt report of esophageal globus sensation. SLP will sign off at this time. Reconsult as needed. Above reviewed with Pt and Dr. Luan Pulling.  SLP Visit Diagnosis: Dysphagia, unspecified (R13.10)    Aspiration Risk  No limitations    Diet Recommendation Regular;Thin liquid   Liquid Administration via: Cup;Straw Medication Administration: Whole meds with liquid Supervision: Patient able to self feed Postural Changes: Seated upright at 90 degrees;Remain upright for at least 30 minutes after po intake    Other  Recommendations Recommended Consults: Consider esophageal assessment (Consider BPE) Oral Care Recommendations: Oral care BID;Patient independent with oral care Other Recommendations: Clarify dietary restrictions   Follow up Recommendations None      Frequency and Duration            Prognosis        Swallow Study   General Date of Onset: 05/30/17 HPI: 66 year old male with a history of hypertension and diabetes, presented to the emergency room with complaints of shortness of breath chest pain. Found to have evidence of pneumonia and started on intravenous antibiotics. On 11/1, respiratory status decompensated and he required transfer to stepdown for bipap therapy.  Pulmonology consulted. CT scan did not show bronchiectasis and shows groundglass opacity. BSE ordered due to h/o PNA with possible aspiration component.  Type of Study: Bedside Swallow Evaluation Previous Swallow  Assessment: none on record Diet Prior to this Study: Regular;Thin liquids Temperature Spikes Noted: No Respiratory Status: Nasal cannula History of Recent Intubation: No Behavior/Cognition: Alert;Cooperative;Pleasant mood Oral Cavity Assessment: Within Functional Limits Oral Care Completed by SLP: No Oral Cavity - Dentition: Adequate natural dentition Vision: Functional for self-feeding Self-Feeding Abilities: Able to feed self Patient Positioning: Upright in bed Baseline Vocal Quality: Normal Volitional Cough: Strong Volitional Swallow: Able to elicit    Oral/Motor/Sensory Function Overall Oral Motor/Sensory Function: Within functional limits   Ice Chips Ice chips: Within functional limits Presentation: Spoon   Thin Liquid Thin Liquid: Within functional limits Presentation: Cup;Self Fed;Straw    Nectar Thick Nectar Thick Liquid: Not tested   Honey Thick Honey Thick Liquid: Not tested   Puree Puree: Within functional limits Presentation: Self Fed;Spoon   Solid   Thank you,  Genene Churn, CCC-SLP [phone]    Solid: Within functional limits Presentation: Self Fed        Rivan Siordia 06/03/2017,10:22 AM

## 2017-06-03 NOTE — Progress Notes (Signed)
Subjective: He says he feels better.  He is still coughing.  CT scan did not show bronchiectasis and shows groundglass opacity.  I think this still is likely pneumonia.  Objective: Vital signs in last 24 hours: Temp:  [98.2 F (36.8 C)-99.4 F (37.4 C)] 98.6 F (37 C) (11/03 0400) Pulse Rate:  [82-112] 83 (11/03 0700) Resp:  [14-30] 22 (11/03 0700) BP: (71-185)/(50-100) 185/91 (11/03 0700) SpO2:  [90 %-97 %] 97 % (11/03 0812) Weight change:  Last BM Date: 06/02/17  Intake/Output from previous day: 11/02 0701 - 11/03 0700 In: 1790 [P.O.:840; IV Piggyback:950] Out: 1225 [Urine:1225]  PHYSICAL EXAM General appearance: alert, cooperative, no distress and morbidly obese Resp: clear to auscultation bilaterally Cardio: regular rate and rhythm, S1, S2 normal, no murmur, click, rub or gallop GI: soft, non-tender; bowel sounds normal; no masses,  no organomegaly Extremities: extremities normal, atraumatic, no cyanosis or edema Skin warm and dry  Lab Results:  Results for orders placed or performed during the hospital encounter of 05/30/17 (from the past 48 hour(s))  Glucose, capillary     Status: Abnormal   Collection Time: 06/01/17 10:19 AM  Result Value Ref Range   Glucose-Capillary 231 (H) 65 - 99 mg/dL  Glucose, capillary     Status: Abnormal   Collection Time: 06/01/17 11:17 AM  Result Value Ref Range   Glucose-Capillary 193 (H) 65 - 99 mg/dL  Glucose, capillary     Status: Abnormal   Collection Time: 06/01/17  3:54 PM  Result Value Ref Range   Glucose-Capillary 141 (H) 65 - 99 mg/dL  Glucose, capillary     Status: Abnormal   Collection Time: 06/01/17  8:54 PM  Result Value Ref Range   Glucose-Capillary 385 (H) 65 - 99 mg/dL   Comment 1 Notify RN   Basic metabolic panel     Status: Abnormal   Collection Time: 06/02/17  4:05 AM  Result Value Ref Range   Sodium 142 135 - 145 mmol/Ramos   Potassium 4.0 3.5 - 5.1 mmol/Ramos   Chloride 101 101 - 111 mmol/Ramos   CO2 30 22 - 32 mmol/Ramos    Glucose, Bld 284 (H) 65 - 99 mg/dL   BUN 18 6 - 20 mg/dL   Creatinine, Ser 1.10 0.61 - 1.24 mg/dL   Calcium 8.7 (Ramos) 8.9 - 10.3 mg/dL   GFR calc non Af Amer >60 >60 mL/min   GFR calc Af Amer >60 >60 mL/min    Comment: (NOTE) The eGFR has been calculated using the CKD EPI equation. This calculation has not been validated in all clinical situations. eGFR's persistently <60 mL/min signify possible Chronic Kidney Disease.    Anion gap 11 5 - 15  CBC     Status: Abnormal   Collection Time: 06/02/17  4:05 AM  Result Value Ref Range   WBC 13.1 (H) 4.0 - 10.5 K/uL   RBC 5.18 4.22 - 5.81 MIL/uL   Hemoglobin 13.6 13.0 - 17.0 g/dL   HCT 42.8 39.0 - 52.0 %   MCV 82.6 78.0 - 100.0 fL   MCH 26.3 26.0 - 34.0 pg   MCHC 31.8 30.0 - 36.0 g/dL   RDW 15.4 11.5 - 15.5 %   Platelets 169 150 - 400 K/uL    Comment: SPECIMEN CHECKED FOR CLOTS PLATELET COUNT CONFIRMED BY SMEAR   Glucose, capillary     Status: Abnormal   Collection Time: 06/02/17  7:44 AM  Result Value Ref Range   Glucose-Capillary 333 (H) 65 -  99 mg/dL   Comment 1 Notify RN    Comment 2 Document in Chart   Glucose, capillary     Status: Abnormal   Collection Time: 06/02/17 11:11 AM  Result Value Ref Range   Glucose-Capillary 356 (H) 65 - 99 mg/dL   Comment 1 Notify RN    Comment 2 Document in Chart   Glucose, capillary     Status: Abnormal   Collection Time: 06/02/17  4:52 PM  Result Value Ref Range   Glucose-Capillary 333 (H) 65 - 99 mg/dL   Comment 1 Notify RN    Comment 2 Document in Chart   Glucose, capillary     Status: Abnormal   Collection Time: 06/02/17  9:28 PM  Result Value Ref Range   Glucose-Capillary 386 (H) 65 - 99 mg/dL   Comment 1 Notify RN    Comment 2 Document in Chart   Glucose, capillary     Status: Abnormal   Collection Time: 06/03/17  8:29 AM  Result Value Ref Range   Glucose-Capillary 234 (H) 65 - 99 mg/dL    ABGS  Recent Labs  06/01/17 0229  PHART 7.317*  PO2ART 111*  HCO3 25.6    CULTURES Recent Results (from the past 240 hour(s))  Blood culture (routine x 2)     Status: None (Preliminary result)   Collection Time: 05/30/17  6:23 PM  Result Value Ref Range Status   Specimen Description BLOOD RIGHT FOREARM  Final   Special Requests   Final    BOTTLES DRAWN AEROBIC AND ANAEROBIC Blood Culture adequate volume   Culture NO GROWTH 4 DAYS  Final   Report Status PENDING  Incomplete  Blood culture (routine x 2)     Status: None (Preliminary result)   Collection Time: 05/30/17  6:23 PM  Result Value Ref Range Status   Specimen Description BLOOD RIGHT HAND  Final   Special Requests   Final    BOTTLES DRAWN AEROBIC AND ANAEROBIC Blood Culture adequate volume   Culture NO GROWTH 4 DAYS  Final   Report Status PENDING  Incomplete  MRSA PCR Screening     Status: None   Collection Time: 06/01/17  3:31 AM  Result Value Ref Range Status   MRSA by PCR NEGATIVE NEGATIVE Final    Comment:        The GeneXpert MRSA Assay (FDA approved for NASAL specimens only), is one component of a comprehensive MRSA colonization surveillance program. It is not intended to diagnose MRSA infection nor to guide or monitor treatment for MRSA infections.    Studies/Results: Ct Chest W Contrast  Result Date: 06/02/2017 CLINICAL DATA:  Pneumonia, chest pain EXAM: CT CHEST WITH CONTRAST TECHNIQUE: Multidetector CT imaging of the chest was performed during intravenous contrast administration. CONTRAST:  75 cc Isovue 370 IV COMPARISON:  01/27/2017 FINDINGS: Cardiovascular: Heart is borderline in size. Aorta is normal caliber. Mediastinum/Nodes: Mildly enlarged mediastinal lymph nodes. These have increased in size since prior study. Right paratracheal lymph node has a short axis diameter of 11 mm. Inferior right paratracheal lymph node has a short axis diameter of 13 mm. Mild bilateral hilar adenopathy. No axillary adenopathy. Lungs/Pleura: Extensive ground-glass opacities noted in the upper lobes  bilaterally, left greater than right as well as superior segment of the left lower lobe. Findings most likely reflect multifocal pneumonia. No effusions. Upper Abdomen: Imaging into the upper abdomen shows no acute findings. Musculoskeletal: Chest wall soft tissues are unremarkable. No acute bony abnormality. Degenerative spurring throughout  the thoracic spine. IMPRESSION: Extensive ground-glass opacities in the upper lobes, left greater than right as well as the superior segment of the left lower lobe concerning for multifocal pneumonia. Given the upper lobe predominance, pulmonary edema is felt less likely but also possible. Bilateral hilar and axillary mild adenopathy, likely reactive. Electronically Signed   By: Rolm Baptise M.D.   On: 06/02/2017 11:59    Medications:  Prior to Admission:  Prescriptions Prior to Admission  Medication Sig Dispense Refill Last Dose  . amLODipine (NORVASC) 10 MG tablet Take 10 mg by mouth daily.   Past Month at Unknown time  . aspirin EC 81 MG tablet Take 81 mg by mouth daily.   Past Month at Unknown time  . DM-APAP-CPM (FLU HBP) 15-500-2 MG TABS Take 1-2 tablets by mouth daily as needed (for flu symptoms).   Past Week at Unknown time  . furosemide (LASIX) 20 MG tablet Take 20 mg by mouth daily.   05/30/2017 at Unknown time  . glipiZIDE (GLUCOTROL) 10 MG tablet Take 10 mg by mouth 2 (two) times daily before a meal.   05/30/2017 at Unknown time  . Insulin Degludec (TRESIBA FLEXTOUCH) 200 UNIT/ML SOPN Inject 50 Units into the skin at bedtime.    05/30/2017 at Unknown time  . metFORMIN (GLUCOPHAGE) 1000 MG tablet Take 1,000 mg by mouth 2 (two) times daily.   05/30/2017 at Unknown time  . tamsulosin (FLOMAX) 0.4 MG CAPS capsule Take 1 capsule by mouth every morning.    05/30/2017 at Unknown time  . TRULICITY 1.5 RC/1.6LA SOPN Inject 1.5 mg into the skin once a week. Takes on Mondays/Tuesdays   05/22/2017 at Unknown time  . valsartan-hydrochlorothiazide (DIOVAN-HCT)  320-12.5 MG tablet Take 1 tablet by mouth daily.   05/30/2017 at Unknown time   Scheduled: . amLODipine  10 mg Oral Daily  . aspirin EC  81 mg Oral Daily  . budesonide (PULMICORT) nebulizer solution  0.25 mg Nebulization BID  . enoxaparin (LOVENOX) injection  60 mg Subcutaneous Q24H  . guaiFENesin-codeine  5 mL Oral Q6H  . insulin aspart  0-15 Units Subcutaneous TID WC  . insulin aspart  0-5 Units Subcutaneous QHS  . insulin aspart  5 Units Subcutaneous TID WC  . insulin glargine  40 Units Subcutaneous QHS  . ipratropium-albuterol  3 mL Nebulization Q6H  . irbesartan  300 mg Oral Daily  . mouth rinse  15 mL Mouth Rinse BID  . methylPREDNISolone (SOLU-MEDROL) injection  60 mg Intravenous Q12H  . tamsulosin  0.4 mg Oral q morning - 10a   Continuous: . ampicillin-sulbactam (UNASYN) IV 3 g (06/03/17 0856)  . azithromycin Stopped (06/02/17 1843)   GTX:[id number], albuterol, famotidine, hydrALAZINE, labetalol  Assesment: He is admitted with community-acquired pneumonia which is multifocal.  He has acute hypoxic respiratory failure.  He is on appropriate treatment and seems better.  His chest is clearer today than before.  He is still coughing up some sputum.  CT was not particularly helpful. Principal Problem:   CAP (community acquired pneumonia) Active Problems:   Diabetes ([place])   Hypertension   Obesity   Acute respiratory failure with hypoxia ([place])    Plan: Continue current treatments.    LOS: 4 days   Vincent Ramos 06/03/2017, 9:38 AM

## 2017-06-03 NOTE — Progress Notes (Signed)
PROGRESS NOTE    Vincent Ramos  PNT:[id number] DOB: 1951/03/24 DOA: 05/30/2017 PCP: [name], MD    Brief Narrative:  66 year old male with a history of hypertension and diabetes, presented to the emergency room with complaints of shortness of breath chest pain.  Found to have evidence of pneumonia and started on intravenous antibiotics.  On 11/1, respiratory status decompensated and he required transfer to stepdown for bipap therapy. Pulmonology consulted.   Assessment & Plan:   Principal Problem:   CAP (community acquired pneumonia) Active Problems:   Diabetes ([place])   Hypertension   Obesity   Acute respiratory failure with hypoxia ([place])   1. Community-acquired pneumonia.  Chest x-ray shows left lower lobe infiltrate. He was initially started on Rocephin and azithromycin.  Shortly after admission, his respiratory status decompensated and repeat chest xray showed worsening pneumonia on left side. Patient was also noted to be having a fever. Unclear if he had an aspiration event that led to decompensation.  Antibiotics were changed from ceftriaxone to unasyn for better anaerobic coverage. Clinically, he appears to be improving. CT chest shows pneumonia. Continue on bronchodilators.  Appreciate Dr. Luan Pulling assistance. Continue on IV steroids for pneumonia for now. BNP is only minimally elevated in 150s and he did not have significant improvement after receiving lasix.  Seen by speech therapy and concern for esophageal dysmotility. He will need an esophagram, but this mostly likely will be done on Monday. 2. Chest pain.  Likely pleuritic from pneumonia.  He has ruled out for ACS with negative cardiac markers. 3. Diabetes.  Hyperglycemia likely related to steroids.  Blood sugars are trending up.  Lantus and NovoLog adjusted.  Continue sliding scale insulin. 4. Hypertension.  Blood pressures have been stable.  Continue Norvasc, valsartan and hydrochlorothiazide. 5. Acute respiratory  failure with hypoxia.  Related to pneumonia.  Intermittently required BiPAP, but now on nasal cannula. Weaning off oxygen as tolerated.   DVT prophylaxis: Lovenox Code Status: Full code Family Communication: No family present Disposition Plan: Discharge home once improved   Consultants:   Pulmonology  Speech therapy  Procedures:     Antimicrobials:   Rocephin 10/30 > 11/1  Azithromycin 10/30 >  Unasyn 11/1>   Subjective: Did not require bipap overnight. Breathing is improving. He does complain of some productive cough.  Objective: Vitals:   06/03/17 0500 06/03/17 0600 06/03/17 0700 06/03/17 0812  BP: (!) 143/71 (!) 146/65 (!) 185/91   Pulse: 95 82 83   Resp: (!) 24 17 (!) 22   Temp:    98.6 F (37 C)  TempSrc:    Oral  SpO2: 96% 96% 95% 97%  Weight:      Height:        Intake/Output Summary (Last 24 hours) at 06/03/17 1013 Last data filed at 06/03/17 0900  Gross per 24 hour  Intake             1550 ml  Output             1925 ml  Net             -375 ml   Filed Weights   05/30/17 2015 06/01/17 0310 06/02/17 0500  Weight: 123.5 kg (272 lb 3.2 oz) 125.7 kg (277 lb 1.9 oz) 123.1 kg (271 lb 6.2 oz)    Examination:  General exam: Appears calm and comfortable  Respiratory system: Clear to auscultation. Increased respiratory effort. Cardiovascular system: S1 & S2 heard, RRR. No JVD, murmurs, rubs,  gallops or clicks. 1+ pedal edema. Gastrointestinal system: Abdomen is nondistended, soft and nontender. No organomegaly or masses felt. Normal bowel sounds heard. Central nervous system: Alert and oriented. No focal neurological deficits. Extremities: Symmetric 5 x 5 power. Skin: No rashes, lesions or ulcers Psychiatry: Judgement and insight appear normal. Mood & affect appropriate.     Data Reviewed: I have personally reviewed following labs and imaging studies  CBC:  Recent Labs Lab 05/30/17 1519 05/31/17 0747 06/02/17 0405  WBC 12.3* 14.3* 13.1*    NEUTROABS 9.3*  --   --   HGB 13.6 13.4 13.6  HCT 42.1 40.9 42.8  MCV 81.0 80.8 82.6  PLT 166 139* 846   Basic Metabolic Panel:  Recent Labs Lab 05/30/17 1519 05/31/17 0747 06/02/17 0405  NA 139 139 142  K 3.3* 4.1 4.0  CL 99* 100* 101  CO2 29 29 30   GLUCOSE 117* 286* 284*  BUN 10 18 18   CREATININE 1.22 1.10 1.10  CALCIUM 9.2 8.6* 8.7*   GFR: Estimated Creatinine Clearance: 85.3 mL/min (by C-G formula based on SCr of 1.1 mg/dL). Liver Function Tests:  Recent Labs Lab 05/30/17 1530  AST 31  ALT 33  ALKPHOS 73  BILITOT 0.9  PROT 7.8  ALBUMIN 4.1   No results for input(s): LIPASE, AMYLASE in the last 168 hours. No results for input(s): AMMONIA in the last 168 hours. Coagulation Profile: No results for input(s): INR, PROTIME in the last 168 hours. Cardiac Enzymes:  Recent Labs Lab 05/30/17 1959 05/31/17 0137 05/31/17 0747 06/01/17 0402  TROPONINI 0.04* 0.04* 0.03* 0.07*   BNP (last 3 results) No results for input(s): PROBNP in the last 8760 hours. HbA1C: No results for input(s): HGBA1C in the last 72 hours. CBG:  Recent Labs Lab 06/02/17 0744 06/02/17 1111 06/02/17 1652 06/02/17 2128 06/03/17 0829  GLUCAP 333* 356* 333* 386* 234*   Lipid Profile: No results for input(s): CHOL, HDL, LDLCALC, TRIG, CHOLHDL, LDLDIRECT in the last 72 hours. Thyroid Function Tests: No results for input(s): TSH, T4TOTAL, FREET4, T3FREE, THYROIDAB in the last 72 hours. Anemia Panel: No results for input(s): VITAMINB12, FOLATE, FERRITIN, TIBC, IRON, RETICCTPCT in the last 72 hours. Sepsis Labs:  Recent Labs Lab 05/30/17 1823  LATICACIDVEN 1.1    Recent Results (from the past 240 hour(s))  Blood culture (routine x 2)     Status: None (Preliminary result)   Collection Time: 05/30/17  6:23 PM  Result Value Ref Range Status   Specimen Description BLOOD RIGHT FOREARM  Final   Special Requests   Final    BOTTLES DRAWN AEROBIC AND ANAEROBIC Blood Culture adequate  volume   Culture NO GROWTH 4 DAYS  Final   Report Status PENDING  Incomplete  Blood culture (routine x 2)     Status: None (Preliminary result)   Collection Time: 05/30/17  6:23 PM  Result Value Ref Range Status   Specimen Description BLOOD RIGHT HAND  Final   Special Requests   Final    BOTTLES DRAWN AEROBIC AND ANAEROBIC Blood Culture adequate volume   Culture NO GROWTH 4 DAYS  Final   Report Status PENDING  Incomplete  MRSA PCR Screening     Status: None   Collection Time: 06/01/17  3:31 AM  Result Value Ref Range Status   MRSA by PCR NEGATIVE NEGATIVE Final    Comment:        The GeneXpert MRSA Assay (FDA approved for NASAL specimens only), is one component of a comprehensive  MRSA colonization surveillance program. It is not intended to diagnose MRSA infection nor to guide or monitor treatment for MRSA infections.          Radiology Studies: Ct Chest W Contrast  Result Date: 06/02/2017 CLINICAL DATA:  Pneumonia, chest pain EXAM: CT CHEST WITH CONTRAST TECHNIQUE: Multidetector CT imaging of the chest was performed during intravenous contrast administration. CONTRAST:  75 cc Isovue 370 IV COMPARISON:  01/27/2017 FINDINGS: Cardiovascular: Heart is borderline in size. Aorta is normal caliber. Mediastinum/Nodes: Mildly enlarged mediastinal lymph nodes. These have increased in size since prior study. Right paratracheal lymph node has a short axis diameter of 11 mm. Inferior right paratracheal lymph node has a short axis diameter of 13 mm. Mild bilateral hilar adenopathy. No axillary adenopathy. Lungs/Pleura: Extensive ground-glass opacities noted in the upper lobes bilaterally, left greater than right as well as superior segment of the left lower lobe. Findings most likely reflect multifocal pneumonia. No effusions. Upper Abdomen: Imaging into the upper abdomen shows no acute findings. Musculoskeletal: Chest wall soft tissues are unremarkable. No acute bony abnormality. Degenerative  spurring throughout the thoracic spine. IMPRESSION: Extensive ground-glass opacities in the upper lobes, left greater than right as well as the superior segment of the left lower lobe concerning for multifocal pneumonia. Given the upper lobe predominance, pulmonary edema is felt less likely but also possible. Bilateral hilar and axillary mild adenopathy, likely reactive. Electronically Signed   By: Rolm Baptise M.D.   On: 06/02/2017 11:59        Scheduled Meds: . amLODipine  10 mg Oral Daily  . aspirin EC  81 mg Oral Daily  . budesonide (PULMICORT) nebulizer solution  0.25 mg Nebulization BID  . enoxaparin (LOVENOX) injection  60 mg Subcutaneous Q24H  . furosemide  20 mg Oral Daily  . guaiFENesin-codeine  5 mL Oral Q6H  . hydrALAZINE  25 mg Oral Q8H  . insulin aspart  0-15 Units Subcutaneous TID WC  . insulin aspart  0-5 Units Subcutaneous QHS  . insulin aspart  5 Units Subcutaneous TID WC  . insulin glargine  40 Units Subcutaneous QHS  . ipratropium-albuterol  3 mL Nebulization Q6H  . irbesartan  300 mg Oral Daily  . mouth rinse  15 mL Mouth Rinse BID  . methylPREDNISolone (SOLU-MEDROL) injection  60 mg Intravenous Q12H  . tamsulosin  0.4 mg Oral q morning - 10a   Continuous Infusions: . ampicillin-sulbactam (UNASYN) IV 3 g (06/03/17 0856)  . azithromycin Stopped (06/02/17 1843)     LOS: 4 days    Time spent: 73mins    MEMON,JEHANZEB, MD [facility] Pager [phone]  If 7PM-7AM, please contact night-coverage [email or web address] Password [email or web address] 06/03/2017, 10:13 AM

## 2017-06-04 ENCOUNTER — Other Ambulatory Visit: Payer: Self-pay

## 2017-06-04 DIAGNOSIS — R1310 Dysphagia, unspecified: Secondary | ICD-10-CM

## 2017-06-04 LAB — BASIC METABOLIC PANEL
Anion gap: 8 (ref 5–15)
BUN: 24 mg/dL — AB (ref 6–20)
CHLORIDE: 100 mmol/L — AB (ref 101–111)
CO2: 31 mmol/L (ref 22–32)
Calcium: 8.4 mg/dL — ABNORMAL LOW (ref 8.9–10.3)
Creatinine, Ser: 1.02 mg/dL (ref 0.61–1.24)
GFR calc Af Amer: >60 mL/min (ref >60)
GLUCOSE: 293 mg/dL — AB (ref 65–99)
POTASSIUM: 4 mmol/L (ref 3.5–5.1)
SODIUM: 139 mmol/L (ref 135–145)

## 2017-06-04 LAB — CULTURE, BLOOD (ROUTINE X 2)
CULTURE: NO GROWTH
Culture: NO GROWTH
Special Requests: ADEQUATE
Special Requests: ADEQUATE

## 2017-06-04 LAB — GLUCOSE, CAPILLARY
GLUCOSE-CAPILLARY: 315 mg/dL — AB (ref 65–99)
GLUCOSE-CAPILLARY: 306 mg/dL — AB (ref 65–99)
GLUCOSE-CAPILLARY: 326 mg/dL — AB (ref 65–99)
Glucose-Capillary: 291 mg/dL — ABNORMAL HIGH (ref 65–99)

## 2017-06-04 LAB — MAGNESIUM: Magnesium: 2.5 mg/dL — ABNORMAL HIGH (ref 1.7–2.4)

## 2017-06-04 LAB — IMMUNOGLOBULINS A/E/G/M, SERUM
IGA: 341 mg/dL (ref 61–437)
IGG (IMMUNOGLOBIN G), SERUM: 907 mg/dL (ref 700–1600)
IgE (Immunoglobulin E), Serum: 243 IU/mL — ABNORMAL HIGH (ref 0–100)
IgM (Immunoglobulin M), Srm: 77 mg/dL (ref 20–172)

## 2017-06-04 MED ORDER — INSULIN ASPART 100 UNIT/ML ~~LOC~~ SOLN
10.0000 [IU] | Freq: Three times a day (TID) | SUBCUTANEOUS | Status: DC
  Administered 2017-06-04 – 2017-06-06 (×5): 10 [IU] via SUBCUTANEOUS

## 2017-06-04 MED ORDER — IPRATROPIUM-ALBUTEROL 0.5-2.5 (3) MG/3ML IN SOLN
3.0000 mL | Freq: Four times a day (QID) | RESPIRATORY_TRACT | Status: DC
  Administered 2017-06-04 – 2017-06-08 (×14): 3 mL via RESPIRATORY_TRACT
  Filled 2017-06-04 (×14): qty 3

## 2017-06-04 MED ORDER — INSULIN GLARGINE 100 UNIT/ML ~~LOC~~ SOLN
45.0000 [IU] | Freq: Every day | SUBCUTANEOUS | Status: DC
  Administered 2017-06-04 – 2017-06-06 (×3): 45 [IU] via SUBCUTANEOUS
  Filled 2017-06-04 (×4): qty 0.45

## 2017-06-04 NOTE — Progress Notes (Signed)
PROGRESS NOTE    Vincent Ramos  YYT:[id number] DOB: [birth year] DOA: 05/30/2017 PCP: [name], MD    Brief Narrative:  66 year old male with a history of hypertension and diabetes, presented to the emergency room with complaints of shortness of breath chest pain.  Found to have evidence of pneumonia and started on intravenous antibiotics.  On 11/1, respiratory status decompensated and he required transfer to stepdown for bipap therapy. Pulmonology consulted.   Assessment & Plan:   Principal Problem:   CAP (community acquired pneumonia) Active Problems:   Diabetes ([place])   Hypertension   Obesity   Acute respiratory failure with hypoxia ([place])   1. Community-acquired pneumonia.  Chest x-ray shows left lower lobe infiltrate. He was initially started on Rocephin and azithromycin.  Shortly after admission, his respiratory status decompensated and repeat chest xray showed worsening pneumonia on left side. Patient was also noted to be having a fever. Unclear if he had an aspiration event that led to decompensation.  Antibiotics were changed from ceftriaxone to unasyn for better anaerobic coverage. Clinically, he appears to be improving. CT chest shows pneumonia. Continue on bronchodilators.  Appreciate Dr. Luan Pulling assistance. Continue on IV steroids for pneumonia for now. BNP is only minimally elevated in 150s and he did not have significant improvement after receiving lasix.  Seen by speech therapy and concern for esophageal dysmotility. He will need an esophagram, but this mostly likely will be done tomorrow. 2. Chest pain.  Likely pleuritic from pneumonia.  He has ruled out for ACS with negative cardiac markers. 3. Diabetes.  Hyperglycemia likely related to steroids.  Blood sugars are trending up.  Lantus and NovoLog adjusted.  Continue sliding scale insulin. 4. Hypertension.  Blood pressures have been stable.  Continue Norvasc, valsartan and hydrochlorothiazide. 5. Acute respiratory  failure with hypoxia.  Related to pneumonia.  Intermittently required BiPAP, but now on nasal cannula. Weaning off oxygen as tolerated.   DVT prophylaxis: Lovenox Code Status: Full code Family Communication: No family present Disposition Plan: Discharge home once improved   Consultants:   Pulmonology  Speech therapy  Procedures:     Antimicrobials:   Rocephin 10/30 > 11/1  Azithromycin 10/30 >  Unasyn 11/1>   Subjective: Continues to have cough, but feels breathing improving.  Objective: Vitals:   06/03/17 2202 06/04/17 0316 06/04/17 0658 06/04/17 0938  BP: (!) 152/67  (!) 148/66   Pulse: 98  88   Resp: 20  20   Temp: 98.6 F (37 C)  98.2 F (36.8 C)   TempSrc: Oral  Oral   SpO2: 96% 94% 94% 95%  Weight:      Height:        Intake/Output Summary (Last 24 hours) at 06/04/2017 1400 Last data filed at 06/04/2017 1041 Gross per 24 hour  Intake -  Output 1000 ml  Net -1000 ml   Filed Weights   05/30/17 2015 06/01/17 0310 06/02/17 0500  Weight: 123.5 kg (272 lb 3.2 oz) 125.7 kg (277 lb 1.9 oz) 123.1 kg (271 lb 6.2 oz)    Examination:  General exam: Appears calm and comfortable  Respiratory system: Clear to auscultation. Increased respiratory effort. Cardiovascular system: S1 & S2 heard, RRR. No JVD, murmurs, rubs, gallops or clicks. trace pedal edema. Gastrointestinal system: Abdomen is nondistended, soft and nontender. No organomegaly or masses felt. Normal bowel sounds heard. Central nervous system: Alert and oriented. No focal neurological deficits. Extremities: Symmetric 5 x 5 power. Skin: No rashes, lesions or ulcers Psychiatry:  Judgement and insight appear normal. Mood & affect appropriate.     Data Reviewed: I have personally reviewed following labs and imaging studies  CBC: Recent Labs  Lab 05/30/17 1519 05/31/17 0747 06/02/17 0405  WBC 12.3* 14.3* 13.1*  NEUTROABS 9.3*  --   --   HGB 13.6 13.4 13.6  HCT 42.1 40.9 42.8  MCV 81.0 80.8  82.6  PLT 166 139* 681   Basic Metabolic Panel: Recent Labs  Lab 05/30/17 1519 05/31/17 0747 06/02/17 0405 06/04/17 0613  NA 139 139 142 139  K 3.3* 4.1 4.0 4.0  CL 99* 100* 101 100*  CO2 29 29 30 31   GLUCOSE 117* 286* 284* 293*  BUN 10 18 18  24*  CREATININE 1.22 1.10 1.10 1.02  CALCIUM 9.2 8.6* 8.7* 8.4*  MG  --   --   --  2.5*   GFR: Estimated Creatinine Clearance: 92 mL/min (by C-G formula based on SCr of 1.02 mg/dL). Liver Function Tests: Recent Labs  Lab 05/30/17 1530  AST 31  ALT 33  ALKPHOS 73  BILITOT 0.9  PROT 7.8  ALBUMIN 4.1   No results for input(s): LIPASE, AMYLASE in the last 168 hours. No results for input(s): AMMONIA in the last 168 hours. Coagulation Profile: No results for input(s): INR, PROTIME in the last 168 hours. Cardiac Enzymes: Recent Labs  Lab 05/30/17 1959 05/31/17 0137 05/31/17 0747 06/01/17 0402  TROPONINI 0.04* 0.04* 0.03* 0.07*   BNP (last 3 results) No results for input(s): PROBNP in the last 8760 hours. HbA1C: No results for input(s): HGBA1C in the last 72 hours. CBG: Recent Labs  Lab 06/03/17 1133 06/03/17 1617 06/03/17 2201 06/04/17 0800 06/04/17 1115  GLUCAP 292* 320* 367* 291* 315*   Lipid Profile: No results for input(s): CHOL, HDL, LDLCALC, TRIG, CHOLHDL, LDLDIRECT in the last 72 hours. Thyroid Function Tests: No results for input(s): TSH, T4TOTAL, FREET4, T3FREE, THYROIDAB in the last 72 hours. Anemia Panel: No results for input(s): VITAMINB12, FOLATE, FERRITIN, TIBC, IRON, RETICCTPCT in the last 72 hours. Sepsis Labs: Recent Labs  Lab 05/30/17 1823  LATICACIDVEN 1.1    Recent Results (from the past 240 hour(s))  Blood culture (routine x 2)     Status: None   Collection Time: 05/30/17  6:23 PM  Result Value Ref Range Status   Specimen Description BLOOD RIGHT FOREARM  Final   Special Requests   Final    BOTTLES DRAWN AEROBIC AND ANAEROBIC Blood Culture adequate volume   Culture NO GROWTH 5 DAYS   Final   Report Status 06/04/2017 FINAL  Final  Blood culture (routine x 2)     Status: None   Collection Time: 05/30/17  6:23 PM  Result Value Ref Range Status   Specimen Description BLOOD RIGHT HAND  Final   Special Requests   Final    BOTTLES DRAWN AEROBIC AND ANAEROBIC Blood Culture adequate volume   Culture NO GROWTH 5 DAYS  Final   Report Status 06/04/2017 FINAL  Final  MRSA PCR Screening     Status: None   Collection Time: 06/01/17  3:31 AM  Result Value Ref Range Status   MRSA by PCR NEGATIVE NEGATIVE Final    Comment:        The GeneXpert MRSA Assay (FDA approved for NASAL specimens only), is one component of a comprehensive MRSA colonization surveillance program. It is not intended to diagnose MRSA infection nor to guide or monitor treatment for MRSA infections.  Radiology Studies: No results found.      Scheduled Meds: . amLODipine  10 mg Oral Daily  . aspirin EC  81 mg Oral Daily  . budesonide (PULMICORT) nebulizer solution  0.25 mg Nebulization BID  . enoxaparin (LOVENOX) injection  60 mg Subcutaneous Q24H  . furosemide  20 mg Oral Daily  . guaiFENesin-codeine  5 mL Oral Q6H  . hydrALAZINE  25 mg Oral Q8H  . insulin aspart  0-15 Units Subcutaneous TID WC  . insulin aspart  0-5 Units Subcutaneous QHS  . insulin aspart  5 Units Subcutaneous TID WC  . insulin glargine  40 Units Subcutaneous QHS  . ipratropium-albuterol  3 mL Nebulization Q6H WA  . irbesartan  300 mg Oral Daily  . mouth rinse  15 mL Mouth Rinse BID  . methylPREDNISolone (SOLU-MEDROL) injection  60 mg Intravenous Q12H  . tamsulosin  0.4 mg Oral q morning - 10a   Continuous Infusions: . ampicillin-sulbactam (UNASYN) IV Stopped (06/04/17 0819)  . azithromycin Stopped (06/03/17 1717)     LOS: 5 days    Time spent: 14mins    MEMON,JEHANZEB, MD [facility] Pager [phone]  If 7PM-7AM, please contact night-coverage [email or web address] Password [email or web address] 06/04/2017,  2:00 PM

## 2017-06-05 ENCOUNTER — Inpatient Hospital Stay (HOSPITAL_COMMUNITY): Payer: PPO

## 2017-06-05 LAB — GLUCOSE, CAPILLARY
GLUCOSE-CAPILLARY: 268 mg/dL — AB (ref 65–99)
GLUCOSE-CAPILLARY: 326 mg/dL — AB (ref 65–99)
Glucose-Capillary: 254 mg/dL — ABNORMAL HIGH (ref 65–99)
Glucose-Capillary: 212 mg/dL — ABNORMAL HIGH (ref 65–99)

## 2017-06-05 MED ORDER — MAGIC MOUTHWASH
10.0000 mL | Freq: Three times a day (TID) | ORAL | Status: DC
  Administered 2017-06-05 – 2017-06-08 (×7): 10 mL via ORAL
  Filled 2017-06-05 (×7): qty 10

## 2017-06-05 MED ORDER — PREDNISONE 20 MG PO TABS
40.0000 mg | ORAL_TABLET | Freq: Every day | ORAL | Status: DC
  Administered 2017-06-06: 40 mg via ORAL
  Filled 2017-06-05: qty 2

## 2017-06-05 MED ORDER — MAGIC MOUTHWASH W/LIDOCAINE: 10.0000 mL | Freq: Three times a day (TID) | ORAL | Status: DC

## 2017-06-05 MED ORDER — IOPAMIDOL (ISOVUE-300) INJECTION 61%
INTRAVENOUS | Status: AC
  Filled 2017-06-05: qty 200

## 2017-06-05 MED ORDER — FLUCONAZOLE 100 MG PO TABS
100.0000 mg | ORAL_TABLET | Freq: Every day | ORAL | Status: DC
  Administered 2017-06-05 – 2017-06-08 (×4): 100 mg via ORAL
  Filled 2017-06-05 (×4): qty 1

## 2017-06-05 MED ORDER — LIDOCAINE VISCOUS 2 % MT SOLN
10.0000 mL | Freq: Three times a day (TID) | OROMUCOSAL | Status: DC
  Administered 2017-06-05 – 2017-06-08 (×7): 10 mL via OROMUCOSAL
  Filled 2017-06-05 (×7): qty 15

## 2017-06-05 NOTE — Progress Notes (Signed)
PROGRESS NOTE    Vincent Ramos  KWI:[id number] DOB: Nov 08, 1950 DOA: 05/30/2017 PCP: [name], MD    Brief Narrative:  66 year old male with a history of hypertension and diabetes, presented to the emergency room with complaints of shortness of breath chest pain.  Found to have evidence of pneumonia and started on intravenous antibiotics.  On 11/1, respiratory status decompensated and he required transfer to stepdown for bipap therapy. Pulmonology consulted.   Assessment & Plan:   Principal Problem:   CAP (community acquired pneumonia) Active Problems:   Diabetes ([place])   Hypertension   Obesity   Acute respiratory failure with hypoxia ([facility])   Dysphagia   1. Community-acquired pneumonia.  Chest x-ray shows left lower lobe infiltrate. He was initially started on Rocephin and azithromycin.  Shortly after admission, his respiratory status decompensated and repeat chest xray showed worsening pneumonia on left side. Patient was also noted to be having a fever. Unclear if he had an aspiration event that led to decompensation.  Antibiotics were changed from ceftriaxone to unasyn for better anaerobic coverage. Clinically, he appears to be improving. CT chest shows pneumonia. Continue on bronchodilators.  Appreciate Dr. Luan Pulling assistance.  Since overall wheezing is better, will transition steroids to prednisone. BNP is only minimally elevated in 150s and he did not have significant improvement after receiving lasix.  Seen by speech therapy and concern for esophageal dysmotility.  He underwent esophagram today and spontaneously cleared.  Since the patient is still having difficulty with eating solids and liquids and feels that this gets caught in his chest, will consult GI to see if endoscopy is necessary. 2. Chest pain.  Likely pleuritic from pneumonia.  He has ruled out for ACS with negative cardiac markers. 3. Diabetes.  Hyperglycemia likely related to steroids. Lantus and NovoLog adjusted.   Continue sliding scale insulin.  Anticipate blood sugar should improve as steroids are tapered. 4. Hypertension.  Blood pressures have been stable.  Continue Norvasc, valsartan and hydrochlorothiazide. 5. Acute respiratory failure with hypoxia.  Related to pneumonia.  Intermittently required BiPAP, but now on nasal cannula. Weaning off oxygen as tolerated.   DVT prophylaxis: Lovenox Code Status: Full code Family Communication: No family present Disposition Plan: Discharge home once improved   Consultants:   Pulmonology  Speech therapy  Procedures:     Antimicrobials:   Rocephin 10/30 > 11/1  Azithromycin 10/30 >11/5  Unasyn 11/1>   Subjective: Feels that he has difficulty swallowing solids and liquids.  Feels that they get stuck in his chest.  Overall coughing is better.  Objective: Vitals:   06/05/17 0600 06/05/17 0850 06/05/17 0901 06/05/17 1458  BP: (!) 155/60     Pulse: 78     Resp: 18     Temp: 98.7 F (37.1 C)     TempSrc: Oral     SpO2: 97% 96% 98% 96%  Weight:      Height:        Intake/Output Summary (Last 24 hours) at 06/05/2017 1529 Last data filed at 06/05/2017 1300 Gross per 24 hour  Intake 1320 ml  Output 1850 ml  Net -530 ml   Filed Weights   05/30/17 2015 06/01/17 0310 06/02/17 0500  Weight: 123.5 kg (272 lb 3.2 oz) 125.7 kg (277 lb 1.9 oz) 123.1 kg (271 lb 6.2 oz)    Examination:  General exam: Appears calm and comfortable  Respiratory system: Clear to auscultation. Increased respiratory effort. Cardiovascular system: S1 & S2 heard, RRR. No JVD, murmurs,  rubs, gallops or clicks. trace pedal edema. Gastrointestinal system: Abdomen is nondistended, soft and nontender. No organomegaly or masses felt. Normal bowel sounds heard. Central nervous system: Alert and oriented. No focal neurological deficits. Extremities: Symmetric 5 x 5 power. Skin: No rashes, lesions or ulcers Psychiatry: Judgement and insight appear normal. Mood & affect  appropriate.     Data Reviewed: I have personally reviewed following labs and imaging studies  CBC: Recent Labs  Lab 05/30/17 1519 05/31/17 0747 06/02/17 0405  WBC 12.3* 14.3* 13.1*  NEUTROABS 9.3*  --   --   HGB 13.6 13.4 13.6  HCT 42.1 40.9 42.8  MCV 81.0 80.8 82.6  PLT 166 139* 427   Basic Metabolic Panel: Recent Labs  Lab 05/30/17 1519 05/31/17 0747 06/02/17 0405 06/04/17 0613  NA 139 139 142 139  K 3.3* 4.1 4.0 4.0  CL 99* 100* 101 100*  CO2 29 29 30 31   GLUCOSE 117* 286* 284* 293*  BUN 10 18 18  24*  CREATININE 1.22 1.10 1.10 1.02  CALCIUM 9.2 8.6* 8.7* 8.4*  MG  --   --   --  2.5*   GFR: Estimated Creatinine Clearance: 92 mL/min (by C-G formula based on SCr of 1.02 mg/dL). Liver Function Tests: Recent Labs  Lab 05/30/17 1530  AST 31  ALT 33  ALKPHOS 73  BILITOT 0.9  PROT 7.8  ALBUMIN 4.1   No results for input(s): LIPASE, AMYLASE in the last 168 hours. No results for input(s): AMMONIA in the last 168 hours. Coagulation Profile: No results for input(s): INR, PROTIME in the last 168 hours. Cardiac Enzymes: Recent Labs  Lab 05/30/17 1959 05/31/17 0137 05/31/17 0747 06/01/17 0402  TROPONINI 0.04* 0.04* 0.03* 0.07*   BNP (last 3 results) No results for input(s): PROBNP in the last 8760 hours. HbA1C: No results for input(s): HGBA1C in the last 72 hours. CBG: Recent Labs  Lab 06/04/17 1115 06/04/17 1658 06/04/17 2107 06/05/17 0738 06/05/17 1123  GLUCAP 315* 306* 326* 254* 212*   Lipid Profile: No results for input(s): CHOL, HDL, LDLCALC, TRIG, CHOLHDL, LDLDIRECT in the last 72 hours. Thyroid Function Tests: No results for input(s): TSH, T4TOTAL, FREET4, T3FREE, THYROIDAB in the last 72 hours. Anemia Panel: No results for input(s): VITAMINB12, FOLATE, FERRITIN, TIBC, IRON, RETICCTPCT in the last 72 hours. Sepsis Labs: Recent Labs  Lab 05/30/17 1823  LATICACIDVEN 1.1    Recent Results (from the past 240 hour(s))  Blood culture  (routine x 2)     Status: None   Collection Time: 05/30/17  6:23 PM  Result Value Ref Range Status   Specimen Description BLOOD RIGHT FOREARM  Final   Special Requests   Final    BOTTLES DRAWN AEROBIC AND ANAEROBIC Blood Culture adequate volume   Culture NO GROWTH 5 DAYS  Final   Report Status 06/04/2017 FINAL  Final  Blood culture (routine x 2)     Status: None   Collection Time: 05/30/17  6:23 PM  Result Value Ref Range Status   Specimen Description BLOOD RIGHT HAND  Final   Special Requests   Final    BOTTLES DRAWN AEROBIC AND ANAEROBIC Blood Culture adequate volume   Culture NO GROWTH 5 DAYS  Final   Report Status 06/04/2017 FINAL  Final  MRSA PCR Screening     Status: None   Collection Time: 06/01/17  3:31 AM  Result Value Ref Range Status   MRSA by PCR NEGATIVE NEGATIVE Final    Comment:  The GeneXpert MRSA Assay (FDA approved for NASAL specimens only), is one component of a comprehensive MRSA colonization surveillance program. It is not intended to diagnose MRSA infection nor to guide or monitor treatment for MRSA infections.          Radiology Studies: Dg Esophagus  Result Date: 06/05/2017 CLINICAL DATA:  Cervical esophageal dysphagia, recurrent pneumonia EXAM: ESOPHOGRAM / BARIUM SWALLOW / BARIUM TABLET STUDY TECHNIQUE: Combined double contrast and single contrast examination performed using effervescent crystals, thick barium liquid, and thin barium liquid. The patient was observed with fluoroscopy swallowing a 13 mm barium sulphate tablet. FLUOROSCOPY TIME:  Fluoroscopy Time:  1 minutes 42 seconds Radiation Exposure Index (if provided by the fluoroscopic device): 51.6 mGy Number of Acquired Spot Images: multiple fluoroscopic screen captures COMPARISON:  None FINDINGS: Esophageal distention: Normal distention without mass or stricture Filling defects:  None 12.5 mm barium tablet: Passed from oral cavity to stomach without delay Motility: Mild diffuse  age-related esophageal dysmotility, age-appropriate Mucosa:  Smooth without irregularity or ulceration Hypopharynx/cervical esophagus: No laryngeal penetration or aspiration. Mild vallecular and piriform sinus residuals which were spontaneously cleared Hiatal hernia:  Absent GE reflux:  Not visualized during exam Other:  N/A IMPRESSION: Mild vallecular and piriform sinus residuals which were spontaneously cleared. Otherwise negative exam. Electronically Signed   By: Lavonia Dana M.D.   On: 06/05/2017 12:08        Scheduled Meds: . amLODipine  10 mg Oral Daily  . aspirin EC  81 mg Oral Daily  . budesonide (PULMICORT) nebulizer solution  0.25 mg Nebulization BID  . enoxaparin (LOVENOX) injection  60 mg Subcutaneous Q24H  . furosemide  20 mg Oral Daily  . guaiFENesin-codeine  5 mL Oral Q6H  . hydrALAZINE  25 mg Oral Q8H  . insulin aspart  0-15 Units Subcutaneous TID WC  . insulin aspart  0-5 Units Subcutaneous QHS  . insulin aspart  10 Units Subcutaneous TID WC  . insulin glargine  45 Units Subcutaneous QHS  . iopamidol      . ipratropium-albuterol  3 mL Nebulization Q6H WA  . irbesartan  300 mg Oral Daily  . mouth rinse  15 mL Mouth Rinse BID  . methylPREDNISolone (SOLU-MEDROL) injection  60 mg Intravenous Q12H  . tamsulosin  0.4 mg Oral q morning - 10a   Continuous Infusions: . ampicillin-sulbactam (UNASYN) IV 3 g (06/05/17 1429)     LOS: 6 days    Time spent: 51mins    Everly Rubalcava, MD [facility] Pager [phone]  If 7PM-7AM, please contact night-coverage [email or web address] Password [email or web address] 06/05/2017, 3:29 PM

## 2017-06-05 NOTE — Progress Notes (Addendum)
Inpatient Diabetes Program Recommendations  AACE/ADA: New Consensus Statement on Inpatient Glycemic Control (2015)  Target Ranges:  Prepandial:   less than 140 mg/dL      Peak postprandial:   less than 180 mg/dL (1-2 hours)      Critically ill patients:  140 - 180 mg/dL   Results for Vincent Ramos, Vincent Ramos (MRN [id number]) as of 06/05/2017 09:27  Ref. Range 06/04/2017 08:00 06/04/2017 11:15 06/04/2017 16:58 06/04/2017 21:07 06/05/2017 07:38  Glucose-Capillary Latest Ref Range: 65 - 99 mg/dL 291 (H) 315 (H) 306 (H) 326 (H) 254 (H)   Review of Glycemic Control  Diabetes history: DM2 Outpatient Diabetes medications: Tresiba 50 units QHS, Trulicity 1.5 mg Qweek, Metformin 1000 mg BID, Glipizide 10 mg BID Current orders for Inpatient glycemic control: Lantus 45 units QHS, Novolog 10 units TID with meals for meal coverage, Novolog 0-15 units TID with meals, Novolog 0-5 units QHS  Inpatient Diabetes Program Recommendations:  Insulin - Basal: If steroids are continued as ordered, please consider increasing Lantus to 50 units QHS. Correction (SSI): Please consider increasing Novolog correction to Novolog 0-20 units TID with meals (if patient will remain NPO, please change frequency of CBG and Novolog correction to Q4H).  NOTE: Ordered Solumedrol 60 mg Q12H which is contributing to hyperglycemia.  Thanks, Barnie Alderman, RN, MSN, CDE Diabetes Coordinator Inpatient Diabetes Program [phone] (Team Pager from 8am to 5pm)

## 2017-06-05 NOTE — Progress Notes (Signed)
Subjective: He says he feels better but still having trouble with feeling like food and liquids get stuck in his throat.  Objective: Vital signs in last 24 hours: Temp:  [98.3 F (36.8 C)-99.1 F (37.3 C)] 98.7 F (37.1 C) (11/05 0600) Pulse Rate:  [78-94] 78 (11/05 0600) Resp:  [18-19] 18 (11/05 0600) BP: (155-191)/(60-79) 155/60 (11/05 0600) SpO2:  [95 %-97 %] 96 % (11/05 0850) Weight change:  Last BM Date: 06/04/17  Intake/Output from previous day: 11/04 0701 - 11/05 0700 In: 960 [P.O.:360; IV Piggyback:600] Out: 2050 [Urine:2050]  PHYSICAL EXAM General appearance: alert, cooperative and no distress Resp: clear to auscultation bilaterally Cardio: regular rate and rhythm, S1, S2 normal, no murmur, click, rub or gallop GI: soft, non-tender; bowel sounds normal; no masses,  no organomegaly Extremities: extremities normal, atraumatic, no cyanosis or edema skin turgor fair  Lab Results:  Results for orders placed or performed during the hospital encounter of 05/30/17 (from the past 48 hour(s))  Glucose, capillary     Status: Abnormal   Collection Time: 06/03/17 11:33 AM  Result Value Ref Range   Glucose-Capillary 292 (H) 65 - 99 mg/dL  Glucose, capillary     Status: Abnormal   Collection Time: 06/03/17  4:17 PM  Result Value Ref Range   Glucose-Capillary 320 (H) 65 - 99 mg/dL   Comment 1 Notify RN    Comment 2 Document in Chart   Glucose, capillary     Status: Abnormal   Collection Time: 06/03/17 10:01 PM  Result Value Ref Range   Glucose-Capillary 367 (H) 65 - 99 mg/dL  Basic metabolic panel     Status: Abnormal   Collection Time: 06/04/17  6:13 AM  Result Value Ref Range   Sodium 139 135 - 145 mmol/L   Potassium 4.0 3.5 - 5.1 mmol/L   Chloride 100 (L) 101 - 111 mmol/L   CO2 31 22 - 32 mmol/L   Glucose, Bld 293 (H) 65 - 99 mg/dL   BUN 24 (H) 6 - 20 mg/dL   Creatinine, Ser 1.02 0.61 - 1.24 mg/dL   Calcium 8.4 (L) 8.9 - 10.3 mg/dL   GFR calc non Af Amer >60 >60  mL/min   GFR calc Af Amer >60 >60 mL/min    Comment: (NOTE) The eGFR has been calculated using the CKD EPI equation. This calculation has not been validated in all clinical situations. eGFR's persistently <60 mL/min signify possible Chronic Kidney Disease.    Anion gap 8 5 - 15  Magnesium     Status: Abnormal   Collection Time: 06/04/17  6:13 AM  Result Value Ref Range   Magnesium 2.5 (H) 1.7 - 2.4 mg/dL  Glucose, capillary     Status: Abnormal   Collection Time: 06/04/17  8:00 AM  Result Value Ref Range   Glucose-Capillary 291 (H) 65 - 99 mg/dL   Comment 1 Notify RN    Comment 2 Document in Chart   Glucose, capillary     Status: Abnormal   Collection Time: 06/04/17 11:15 AM  Result Value Ref Range   Glucose-Capillary 315 (H) 65 - 99 mg/dL   Comment 1 Notify RN    Comment 2 Document in Chart   Glucose, capillary     Status: Abnormal   Collection Time: 06/04/17  4:58 PM  Result Value Ref Range   Glucose-Capillary 306 (H) 65 - 99 mg/dL   Comment 1 Notify RN    Comment 2 Document in Chart   Glucose, capillary  Status: Abnormal   Collection Time: 06/04/17  9:07 PM  Result Value Ref Range   Glucose-Capillary 326 (H) 65 - 99 mg/dL   Comment 1 Notify RN    Comment 2 Document in Chart   Glucose, capillary     Status: Abnormal   Collection Time: 06/05/17  7:38 AM  Result Value Ref Range   Glucose-Capillary 254 (H) 65 - 99 mg/dL    ABGS No results for input(s): PHART, PO2ART, TCO2, HCO3 in the last 72 hours.  Invalid input(s): PCO2 CULTURES Recent Results (from the past 240 hour(s))  Blood culture (routine x 2)     Status: None   Collection Time: 05/30/17  6:23 PM  Result Value Ref Range Status   Specimen Description BLOOD RIGHT FOREARM  Final   Special Requests   Final    BOTTLES DRAWN AEROBIC AND ANAEROBIC Blood Culture adequate volume   Culture NO GROWTH 5 DAYS  Final   Report Status 06/04/2017 FINAL  Final  Blood culture (routine x 2)     Status: None    Collection Time: 05/30/17  6:23 PM  Result Value Ref Range Status   Specimen Description BLOOD RIGHT HAND  Final   Special Requests   Final    BOTTLES DRAWN AEROBIC AND ANAEROBIC Blood Culture adequate volume   Culture NO GROWTH 5 DAYS  Final   Report Status 06/04/2017 FINAL  Final  MRSA PCR Screening     Status: None   Collection Time: 06/01/17  3:31 AM  Result Value Ref Range Status   MRSA by PCR NEGATIVE NEGATIVE Final    Comment:        The GeneXpert MRSA Assay (FDA approved for NASAL specimens only), is one component of a comprehensive MRSA colonization surveillance program. It is not intended to diagnose MRSA infection nor to guide or monitor treatment for MRSA infections.    Studies/Results: No results found.  Medications:  Prior to Admission:  Medications Prior to Admission  Medication Sig Dispense Refill Last Dose  . amLODipine (NORVASC) 10 MG tablet Take 10 mg by mouth daily.   Past Month at Unknown time  . aspirin EC 81 MG tablet Take 81 mg by mouth daily.   Past Month at Unknown time  . DM-APAP-CPM (FLU HBP) 15-500-2 MG TABS Take 1-2 tablets by mouth daily as needed (for flu symptoms).   Past Week at Unknown time  . furosemide (LASIX) 20 MG tablet Take 20 mg by mouth daily.   05/30/2017 at Unknown time  . glipiZIDE (GLUCOTROL) 10 MG tablet Take 10 mg by mouth 2 (two) times daily before a meal.   05/30/2017 at Unknown time  . Insulin Degludec (TRESIBA FLEXTOUCH) 200 UNIT/ML SOPN Inject 50 Units into the skin at bedtime.    05/30/2017 at Unknown time  . metFORMIN (GLUCOPHAGE) 1000 MG tablet Take 1,000 mg by mouth 2 (two) times daily.   05/30/2017 at Unknown time  . tamsulosin (FLOMAX) 0.4 MG CAPS capsule Take 1 capsule by mouth every morning.    05/30/2017 at Unknown time  . TRULICITY 1.5 JE/5.6DJ SOPN Inject 1.5 mg into the skin once a week. Takes on Mondays/Tuesdays   05/22/2017 at Unknown time  . valsartan-hydrochlorothiazide (DIOVAN-HCT) 320-12.5 MG tablet Take 1  tablet by mouth daily.   05/30/2017 at Unknown time   Scheduled: . amLODipine  10 mg Oral Daily  . aspirin EC  81 mg Oral Daily  . budesonide (PULMICORT) nebulizer solution  0.25 mg Nebulization BID  .  enoxaparin (LOVENOX) injection  60 mg Subcutaneous Q24H  . furosemide  20 mg Oral Daily  . guaiFENesin-codeine  5 mL Oral Q6H  . hydrALAZINE  25 mg Oral Q8H  . insulin aspart  0-15 Units Subcutaneous TID WC  . insulin aspart  0-5 Units Subcutaneous QHS  . insulin aspart  10 Units Subcutaneous TID WC  . insulin glargine  45 Units Subcutaneous QHS  . ipratropium-albuterol  3 mL Nebulization Q6H WA  . irbesartan  300 mg Oral Daily  . mouth rinse  15 mL Mouth Rinse BID  . methylPREDNISolone (SOLU-MEDROL) injection  60 mg Intravenous Q12H  . tamsulosin  0.4 mg Oral q morning - 10a   Continuous: . ampicillin-sulbactam (UNASYN) IV Stopped (06/05/17 0241)  . azithromycin Stopped (06/04/17 1909)   OIB:[id number], albuterol, famotidine, hydrALAZINE, labetalol  Assesment:he was admitted with pneumonia. This is improving. He has solid and liquid dysphagia. Principal Problem:   CAP (community acquired pneumonia) Active Problems:   Diabetes ([address])   Hypertension   Obesity   Acute respiratory failure with hypoxia ([place])   Dysphagia    Plan: Continue treatments    LOS: 6 days   HAWKINS,EDWARD L 06/05/2017, 8:55 AM

## 2017-06-06 ENCOUNTER — Encounter: Payer: Self-pay | Admitting: Gastroenterology

## 2017-06-06 ENCOUNTER — Encounter (HOSPITAL_COMMUNITY): Payer: Self-pay | Admitting: Gastroenterology

## 2017-06-06 DIAGNOSIS — R1310 Dysphagia, unspecified: Secondary | ICD-10-CM

## 2017-06-06 DIAGNOSIS — J690 Pneumonitis due to inhalation of food and vomit: Secondary | ICD-10-CM | POA: Diagnosis present

## 2017-06-06 LAB — GLUCOSE, CAPILLARY
GLUCOSE-CAPILLARY: 159 mg/dL — AB (ref 65–99)
GLUCOSE-CAPILLARY: 196 mg/dL — AB (ref 65–99)
Glucose-Capillary: 81 mg/dL (ref 65–99)

## 2017-06-06 LAB — CBC
HCT: 39.9 % (ref 39.0–52.0)
Hemoglobin: 12.5 g/dL — ABNORMAL LOW (ref 13.0–17.0)
MCH: 25.7 pg — AB (ref 26.0–34.0)
MCHC: 31.3 g/dL (ref 30.0–36.0)
MCV: 82.1 fL (ref 78.0–100.0)
PLATELETS: 199 10*3/uL (ref 150–400)
RBC: 4.86 MIL/uL (ref 4.22–5.81)
RDW: 14.9 % (ref 11.5–15.5)
WBC: 13.7 10*3/uL — ABNORMAL HIGH (ref 4.0–10.5)

## 2017-06-06 LAB — BASIC METABOLIC PANEL
Anion gap: 7 (ref 5–15)
BUN: 17 mg/dL (ref 6–20)
CALCIUM: 7.8 mg/dL — AB (ref 8.9–10.3)
CHLORIDE: 100 mmol/L — AB (ref 101–111)
CO2: 31 mmol/L (ref 22–32)
CREATININE: 0.95 mg/dL (ref 0.61–1.24)
GFR calc Af Amer: >60 mL/min (ref >60)
GFR calc non Af Amer: >60 mL/min (ref >60)
Glucose, Bld: 230 mg/dL — ABNORMAL HIGH (ref 65–99)
Potassium: 3.7 mmol/L (ref 3.5–5.1)
Sodium: 138 mmol/L (ref 135–145)

## 2017-06-06 MED ORDER — PANTOPRAZOLE SODIUM 40 MG PO TBEC
40.0000 mg | DELAYED_RELEASE_TABLET | Freq: Every day | ORAL | Status: DC
  Administered 2017-06-06 – 2017-06-08 (×3): 40 mg via ORAL
  Filled 2017-06-06 (×3): qty 1

## 2017-06-06 MED ORDER — INSULIN ASPART 100 UNIT/ML ~~LOC~~ SOLN
8.0000 [IU] | Freq: Once | SUBCUTANEOUS | Status: AC
  Administered 2017-06-06: 8 [IU] via SUBCUTANEOUS

## 2017-06-06 NOTE — Consult Note (Signed)
Referring Provider: Dr. Roderic Palau  Primary Care Physician:  [name], MD Primary Gastroenterologist:  Dr. Oneida Alar   Date of Admission: 05/30/17 Date of Consultation: 06/06/17  Reason for Consultation:  Dysphagia   HPI:  Vincent Ramos is a 66 y.o. year old male who presented to the ED with shortness of breath and chest pain. Found to have pneumonia and started on IV antibiotics. Pulmonology on board due to decompensation noted on 11/1. There was concern of aspiration event. He was seen by speech therapy on 11/3 and reported occasional globus sensation with drinking water. Due to concern for dysmotility, a BPE was ordered. This showed mild diffuse age-related esophageal dysmotility, age appropriate. GI consulted due to dysphagia.    Symptoms started about 2 years ago off and on. States he would drink a bottle of water and it would just hang up in his esophagus. If eating fast, would have issues as well. States grandchildren could hear water gurgling in his esophagus. Breakfast went down ok but had to hold his neck down. Notes a globus sensation. Denies typical symptoms of reflux. Has been using gas pills at the store due to gas discomfort. Stomach growls all day long. Always coughing up phlegm. Sinuses drain a lot.   No prior EGD. No colonoscopy. States he was to have this scheduled in the future but not sure where.   Past Medical History:  Diagnosis Date  . Arthritis   . Diabetes mellitus   . Enlarged prostate   . Hypertension   . Pneumonia   . Pneumonia   . Shortness of breath     Past Surgical History:  Procedure Laterality Date  . CIRCUMCISION    . LACERATION REPAIR     left hand  Additional surgical history that is not pre-populated from [facility] in EPic: Hydrocele excision 2013 Circumcision No prior EGD/TCS  Prior to Admission medications   Medication Sig Start Date End Date Taking? Authorizing Provider  amLODipine (NORVASC) 10 MG tablet Take 10 mg by mouth daily. 05/22/17   Yes [provider]  aspirin EC 81 MG tablet Take 81 mg by mouth daily.   Yes [provider]  DM-APAP-CPM (FLU HBP) 15-500-2 MG TABS Take 1-2 tablets by mouth daily as needed (for flu symptoms).   Yes [provider]  furosemide (LASIX) 20 MG tablet Take 20 mg by mouth daily. 05/20/17  Yes [provider]  glipiZIDE (GLUCOTROL) 10 MG tablet Take 10 mg by mouth 2 (two) times daily before a meal.   Yes [provider]  Insulin Degludec (TRESIBA FLEXTOUCH) 200 UNIT/ML SOPN Inject 50 Units into the skin at bedtime.    Yes [provider]  metFORMIN (GLUCOPHAGE) 1000 MG tablet Take 1,000 mg by mouth 2 (two) times daily. 05/06/17  Yes [provider]  tamsulosin (FLOMAX) 0.4 MG CAPS capsule Take 1 capsule by mouth every morning.  05/20/17  Yes [provider]  TRULICITY 1.5 UE/4.5WU SOPN Inject 1.5 mg into the skin once a week. Takes on [facility] 04/12/17  Yes [provider]  valsartan-hydrochlorothiazide (DIOVAN-HCT) 320-12.5 MG tablet Take 1 tablet by mouth daily. 05/29/17  Yes [provider]    Current Facility-Administered Medications  Medication Dose Route Frequency Provider Last Rate Last Dose  . acetaminophen (TYLENOL) tablet 650 mg  650 mg Oral Q6H PRN Gardiner Barefoot, NP   650 mg at 06/03/17 0907  . albuterol (PROVENTIL) (2.5 MG/3ML) 0.083% nebulizer solution 2.5 mg  2.5 mg Nebulization Q4H  PRN Kathie Dike, MD   2.5 mg at 06/01/17 0230  . amLODipine (NORVASC) tablet 10 mg  10 mg Oral Daily Emokpae, Ejiroghene E, MD   10 mg at 06/06/17 1040  . Ampicillin-Sulbactam (UNASYN) 3 g in sodium chloride 0.9 % 100 mL IVPB  3 g Intravenous Q6H Kathie Dike, MD   Stopped at 06/06/17 0530  . aspirin EC tablet 81 mg  81 mg Oral Daily Emokpae, Ejiroghene E, MD   81 mg at 06/06/17 1040  . budesonide (PULMICORT) nebulizer solution 0.25 mg  0.25 mg Nebulization BID Rise Patience, MD   0.25 mg at  06/06/17 0805  . enoxaparin (LOVENOX) injection 60 mg  60 mg Subcutaneous Q24H Kathie Dike, MD   60 mg at 06/05/17 2155  . famotidine (PEPCID) tablet 20 mg  20 mg Oral BID PRN Gardiner Barefoot, NP   20 mg at 06/02/17 2059  . fluconazole (DIFLUCAN) tablet 100 mg  100 mg Oral Daily Kathie Dike, MD   100 mg at 06/06/17 1040  . furosemide (LASIX) tablet 20 mg  20 mg Oral Daily Kathie Dike, MD   20 mg at 06/06/17 1040  . guaiFENesin-codeine 100-10 MG/5ML solution 5 mL  5 mL Oral Q6H Emokpae, Ejiroghene E, MD   5 mL at 06/06/17 0830  . hydrALAZINE (APRESOLINE) injection 10 mg  10 mg Intravenous Q4H PRN Rise Patience, MD   10 mg at 06/03/17 1710  . hydrALAZINE (APRESOLINE) tablet 25 mg  25 mg Oral Q8H Kathie Dike, MD   25 mg at 06/06/17 0530  . insulin aspart (novoLOG) injection 0-15 Units  0-15 Units Subcutaneous TID WC Emokpae, Ejiroghene E, MD   3 Units at 06/06/17 0830  . insulin aspart (novoLOG) injection 0-5 Units  0-5 Units Subcutaneous QHS Emokpae, Ejiroghene E, MD   4 Units at 06/05/17 2154  . insulin aspart (novoLOG) injection 10 Units  10 Units Subcutaneous TID WC Kathie Dike, MD   10 Units at 06/06/17 0830  . insulin glargine (LANTUS) injection 45 Units  45 Units Subcutaneous QHS Kathie Dike, MD   45 Units at 06/05/17 2155  . ipratropium-albuterol (DUONEB) 0.5-2.5 (3) MG/3ML nebulizer solution 3 mL  3 mL Nebulization Q6H WA Kathie Dike, MD   3 mL at 06/06/17 0800  . irbesartan (AVAPRO) tablet 300 mg  300 mg Oral Daily Emokpae, Ejiroghene E, MD   300 mg at 06/06/17 1039  . labetalol (NORMODYNE,TRANDATE) injection 10 mg  10 mg Intravenous Q2H PRN Emokpae, Ejiroghene E, MD      . magic mouthwash  10 mL Oral TID Kathie Dike, MD   10 mL at 06/06/17 1039   And  . lidocaine (XYLOCAINE) 2 % viscous mouth solution 10 mL  10 mL Mouth/Throat TID Kathie Dike, MD   10 mL at 06/06/17 1039  . MEDLINE mouth rinse  15 mL Mouth Rinse BID Emokpae, Ejiroghene E, MD    15 mL at 06/05/17 2154  . predniSONE (DELTASONE) tablet 40 mg  40 mg Oral Q breakfast Kathie Dike, MD   40 mg at 06/06/17 0831  . tamsulosin (FLOMAX) capsule 0.4 mg  0.4 mg Oral q morning - 10a Emokpae, Ejiroghene E, MD   0.4 mg at 06/06/17 1040    Allergies as of 05/30/2017 - Review Complete 05/30/2017  Allergen Reaction Noted  . Victoza [liraglutide] Itching and Rash 05/30/2017    Family History  Problem Relation Age of Onset  . Diabetes Unknown   . Hypertension Unknown   .  CAD Unknown   . Cancer Unknown   . Anesthesia problems Neg Hx   . Hypotension Neg Hx   . Malignant hyperthermia Neg Hx   . Pseudochol deficiency Neg Hx   . Colon cancer Neg Hx   . Colon polyps Neg Hx     Social History   Socioeconomic History  . Marital status: Married    Spouse name: Not on file  . Number of children: Not on file  . Years of education: Not on file  . Highest education level: Not on file  Social Needs  . Financial resource strain: Not on file  . Food insecurity - worry: Not on file  . Food insecurity - inability: Not on file  . Transportation needs - medical: Not on file  . Transportation needs - non-medical: Not on file  Occupational History  . Not on file  Tobacco Use  . Smoking status: Never Smoker  . Smokeless tobacco: Never Used  Substance and Sexual Activity  . Alcohol use: Yes    Alcohol/week: 0.0 oz    Comment: rare beer once a month  . Drug use: No  . Sexual activity: Yes    Birth control/protection: None  Other Topics Concern  . Not on file  Social History Narrative  . Not on file    Review of Systems: Gen: Denies fever, chills, loss of appetite, change in weight or weight loss CV: Denies chest pain, heart palpitations, syncope, edema  Resp: +DOE with exertion and at rest  GI: see HPI  GU : Denies urinary burning, urinary frequency, urinary incontinence.  MS: Denies joint pain,swelling, cramping Derm: Denies rash, itching, dry skin Psych: Denies  depression, anxiety,confusion, or memory loss Heme: Denies bruising, bleeding, and enlarged lymph nodes.  Physical Exam: Vital signs in last 24 hours: Temp:  [98 F (36.7 C)-98.4 F (36.9 C)] 98 F (36.7 C) (11/06 0537) Pulse Rate:  [71-86] 71 (11/06 0537) Resp:  [18-19] 18 (11/06 0537) BP: (139-174)/(75-78) 174/78 (11/06 0537) SpO2:  [96 %-98 %] 98 % (11/06 0807) Last BM Date: 06/05/17 General:   Alert,  Well-developed, well-nourished, pleasant and cooperative in NAD Head:  Normocephalic and atraumatic. Eyes:  Sclera clear, no icterus.   Conjunctiva pink. Ears:  Normal auditory acuity. Nose:  No deformity, discharge,  or lesions. Mouth:  No deformity or lesions, dentition normal. Lungs:  Scattered rhonchi, no wheezing  Heart:  S1 S2 present without murmurs  Abdomen:  +BS, distended but soft, no rebound or guarding. Sitting up, limited exam. No TTP.  Rectal:  Deferred until time of colonoscopy.   Msk:  Symmetrical without gross deformities. Normal posture. Extremities:  With 2+ pitting lower extremity edema  Neurologic:  Alert and  oriented x4 Psych:  Alert and cooperative. Normal mood and affect.  Intake/Output from previous day: 11/05 0701 - 11/06 0700 In: 1480 [P.O.:1080; IV Piggyback:400] Out: 4500 [Urine:4500] Intake/Output this shift: No intake/output data recorded.  Lab Results: Recent Labs    06/06/17 0555  WBC 13.7*  HGB 12.5*  HCT 39.9  PLT 199   BMET Recent Labs    06/04/17 0613 06/06/17 0555  NA 139 138  K 4.0 3.7  CL 100* 100*  CO2 31 31  GLUCOSE 293* 230*  BUN 24* 17  CREATININE 1.02 0.95  CALCIUM 8.4* 7.8*    Studies/Results: Dg Esophagus  Result Date: 06/05/2017 CLINICAL DATA:  Cervical esophageal dysphagia, recurrent pneumonia EXAM: ESOPHOGRAM / BARIUM SWALLOW / BARIUM TABLET STUDY TECHNIQUE: Combined double contrast  and single contrast examination performed using effervescent crystals, thick barium liquid, and thin barium liquid. The  patient was observed with fluoroscopy swallowing a 13 mm barium sulphate tablet. FLUOROSCOPY TIME:  Fluoroscopy Time:  1 minutes 42 seconds Radiation Exposure Index (if provided by the fluoroscopic device): 51.6 mGy Number of Acquired Spot Images: multiple fluoroscopic screen captures COMPARISON:  None FINDINGS: Esophageal distention: Normal distention without mass or stricture Filling defects:  None 12.5 mm barium tablet: Passed from oral cavity to stomach without delay Motility: Mild diffuse age-related esophageal dysmotility, age-appropriate Mucosa:  Smooth without irregularity or ulceration Hypopharynx/cervical esophagus: No laryngeal penetration or aspiration. Mild vallecular and piriform sinus residuals which were spontaneously cleared Hiatal hernia:  Absent GE reflux:  Not visualized during exam Other:  N/A IMPRESSION: Mild vallecular and piriform sinus residuals which were spontaneously cleared. Otherwise negative exam. Electronically Signed   By: Lavonia Dana M.D.   On: 06/05/2017 12:08    Impression: 66 year old male admitted with pneumonia and noted to have chronic history of dysphagia and globus sensation. No PPI as outpatient, but he does mention taking Nexium for approximately one week at some point. Evaluated by speech with concern for dysmotility, with BPE noting mild diffuse age-related esophageal dysmotility. No prior endoscopic evaluation. Would benefit from EGD with possible dilatation in future once doing well from a pulmonary standpoint. He states he feels better since admission, with improvement in respiratory status. Anticipate EGD prior to discharge.    Plan: Start Protonix once daily Dysphagia 3 diet Will re-evaluate in the morning NPO after midnight in case of EGD/dilatation Will need to be completed with Propofol Outpatient colonoscopy  Annitta Needs, PhD, ANP-[facility]    LOS: 7 days    06/06/2017, 10:49 AM

## 2017-06-06 NOTE — Care Management Important Message (Signed)
Important Message  Patient Details  Name: Vincent Ramos MRN: [id number] Date of Birth: 06/11/1951   Medicare Important Message Given:  Yes    Braiden Presutti, Chauncey Reading, RN 06/06/2017, 2:05 PM

## 2017-06-06 NOTE — Progress Notes (Signed)
PROGRESS NOTE    Vincent Ramos  XHB:[id number] DOB: [birth year] DOA: 05/30/2017 PCP: [name], MD    Brief Narrative:  66 year old male with a history of hypertension and diabetes, presented to the emergency room with complaints of shortness of breath chest pain.  Found to have evidence of pneumonia and started on intravenous antibiotics.  On 11/1, respiratory status decompensated and he required transfer to stepdown for bipap therapy. Pulmonology consulted.  It was felt the patient likely had aspiration pneumonia.  With antibiotics and pulmonary hygiene, his overall respiratory status has improved.  He is now on room air and breathing comfortably.  Continues to have significant problems with eating and drinking.  Describes dysphagia.  GI  has been consulted to see if endoscopy is necessary.  Anticipate discharge home in the next 24-48 hours.   Assessment & Plan:   Principal Problem:   CAP (community acquired pneumonia) Active Problems:   Diabetes ([place])   Hypertension   Obesity   Acute respiratory failure with hypoxia (HCC)   Dysphagia   1. Aspiration pneumonia.  Chest x-ray showed left lower lobe infiltrate. He was initially started on Rocephin and azithromycin.  Shortly after admission, his respiratory status decompensated and repeat chest xray showed worsening pneumonia on left side. Patient was also noted to be having a fever.  It is likely that he had an aspiration event that led to decompensation.  Antibiotics were changed from ceftriaxone to unasyn for better anaerobic coverage. Clinically, he appears to be improving. CT chest showed pneumonia. Continue on bronchodilators.  Appreciate Dr. Luan Pulling assistance.  He was briefly placed on steroids, but since wheezing has improved, steroids have now been discontinued. Seen by speech therapy and concern for esophageal dysmotility.  He underwent esophagram that showed mild vallecular and pyriform sinus residuals which were spontaneously  cleared.  Since the patient was continuing to have difficulty with eating solids and liquids and feels that this gets caught in his chest, GI has been consulted to see if endoscopy is necessary. 2. Chest pain.  Likely pleuritic from pneumonia.  He has ruled out for ACS with negative cardiac markers. 3. Diabetes.  Hyperglycemia likely related to steroids. Lantus and NovoLog adjusted.  Continue sliding scale insulin.  Anticipate blood sugar should improve as steroids are tapered. 4. Hypertension.  Blood pressures have been stable.  Continue Norvasc, valsartan and hydrochlorothiazide. 5. Acute respiratory failure with hypoxia.  Related to pneumonia.  He had intermittently required BiPAP, but now on nasal cannula. Weaning off oxygen as tolerated.   DVT prophylaxis: Lovenox Code Status: Full code Family Communication: No family present Disposition Plan: Discharge home once improved   Consultants:   Pulmonology  Speech therapy  gastroenterology  Procedures:     Antimicrobials:   Rocephin 10/30 > 11/1  Azithromycin 10/30 >11/5  Unasyn 11/1>   Subjective: Breathing continues to improve.  He feels that he is getting choked on certain foods.  Objective: Vitals:   06/06/17 0804 06/06/17 0807 06/06/17 1416 06/06/17 1509  BP:    116/73  Pulse:    83  Resp:    18  Temp:    99.4 F (37.4 C)  TempSrc:      SpO2: 96% 98% (!) 88% 93%  Weight:      Height:        Intake/Output Summary (Last 24 hours) at 06/06/2017 1616 Last data filed at 06/06/2017 1506 Gross per 24 hour  Intake 1220 ml  Output 2750 ml  Net -1530  ml   Filed Weights   05/30/17 2015 06/01/17 0310 06/02/17 0500  Weight: 123.5 kg (272 lb 3.2 oz) 125.7 kg (277 lb 1.9 oz) 123.1 kg (271 lb 6.2 oz)    Examination:  General exam: Appears calm and comfortable  Respiratory system: Clear to auscultation. Increased respiratory effort. Cardiovascular system: S1 & S2 heard, RRR. No JVD, murmurs, rubs, gallops or  clicks. trace pedal edema. Gastrointestinal system: Abdomen is nondistended, soft and nontender. No organomegaly or masses felt. Normal bowel sounds heard. Central nervous system: Alert and oriented. No focal neurological deficits. Extremities: Symmetric 5 x 5 power. Skin: No rashes, lesions or ulcers Psychiatry: Judgement and insight appear normal. Mood & affect appropriate.     Data Reviewed: I have personally reviewed following labs and imaging studies  CBC: Recent Labs  Lab 05/31/17 0747 06/02/17 0405 06/06/17 0555  WBC 14.3* 13.1* 13.7*  HGB 13.4 13.6 12.5*  HCT 40.9 42.8 39.9  MCV 80.8 82.6 82.1  PLT 139* 169 829   Basic Metabolic Panel: Recent Labs  Lab 05/31/17 0747 06/02/17 0405 06/04/17 0613 06/06/17 0555  NA 139 142 139 138  K 4.1 4.0 4.0 3.7  CL 100* 101 100* 100*  CO2 29 30 31 31   GLUCOSE 286* 284* 293* 230*  BUN 18 18 24* 17  CREATININE 1.10 1.10 1.02 0.95  CALCIUM 8.6* 8.7* 8.4* 7.8*  MG  --   --  2.5*  --    GFR: Estimated Creatinine Clearance: 98.8 mL/min (by C-G formula based on SCr of 0.95 mg/dL). Liver Function Tests: No results for input(s): AST, ALT, ALKPHOS, BILITOT, PROT, ALBUMIN in the last 168 hours. No results for input(s): LIPASE, AMYLASE in the last 168 hours. No results for input(s): AMMONIA in the last 168 hours. Coagulation Profile: No results for input(s): INR, PROTIME in the last 168 hours. Cardiac Enzymes: Recent Labs  Lab 05/30/17 1959 05/31/17 0137 05/31/17 0747 06/01/17 0402  TROPONINI 0.04* 0.04* 0.03* 0.07*   BNP (last 3 results) No results for input(s): PROBNP in the last 8760 hours. HbA1C: No results for input(s): HGBA1C in the last 72 hours. CBG: Recent Labs  Lab 06/05/17 1637 06/05/17 2115 06/06/17 0750 06/06/17 1134 06/06/17 1612  GLUCAP 268* 326* 159* 81 196*   Lipid Profile: No results for input(s): CHOL, HDL, LDLCALC, TRIG, CHOLHDL, LDLDIRECT in the last 72 hours. Thyroid Function Tests: No  results for input(s): TSH, T4TOTAL, FREET4, T3FREE, THYROIDAB in the last 72 hours. Anemia Panel: No results for input(s): VITAMINB12, FOLATE, FERRITIN, TIBC, IRON, RETICCTPCT in the last 72 hours. Sepsis Labs: Recent Labs  Lab 05/30/17 1823  LATICACIDVEN 1.1    Recent Results (from the past 240 hour(s))  Blood culture (routine x 2)     Status: None   Collection Time: 05/30/17  6:23 PM  Result Value Ref Range Status   Specimen Description BLOOD RIGHT FOREARM  Final   Special Requests   Final    BOTTLES DRAWN AEROBIC AND ANAEROBIC Blood Culture adequate volume   Culture NO GROWTH 5 DAYS  Final   Report Status 06/04/2017 FINAL  Final  Blood culture (routine x 2)     Status: None   Collection Time: 05/30/17  6:23 PM  Result Value Ref Range Status   Specimen Description BLOOD RIGHT HAND  Final   Special Requests   Final    BOTTLES DRAWN AEROBIC AND ANAEROBIC Blood Culture adequate volume   Culture NO GROWTH 5 DAYS  Final   Report Status  06/04/2017 FINAL  Final  MRSA PCR Screening     Status: None   Collection Time: 06/01/17  3:31 AM  Result Value Ref Range Status   MRSA by PCR NEGATIVE NEGATIVE Final    Comment:        The GeneXpert MRSA Assay (FDA approved for NASAL specimens only), is one component of a comprehensive MRSA colonization surveillance program. It is not intended to diagnose MRSA infection nor to guide or monitor treatment for MRSA infections.          Radiology Studies: Dg Esophagus  Result Date: 06/05/2017 CLINICAL DATA:  Cervical esophageal dysphagia, recurrent pneumonia EXAM: ESOPHOGRAM / BARIUM SWALLOW / BARIUM TABLET STUDY TECHNIQUE: Combined double contrast and single contrast examination performed using effervescent crystals, thick barium liquid, and thin barium liquid. The patient was observed with fluoroscopy swallowing a 13 mm barium sulphate tablet. FLUOROSCOPY TIME:  Fluoroscopy Time:  1 minutes 42 seconds Radiation Exposure Index (if provided  by the fluoroscopic device): 51.6 mGy Number of Acquired Spot Images: multiple fluoroscopic screen captures COMPARISON:  None FINDINGS: Esophageal distention: Normal distention without mass or stricture Filling defects:  None 12.5 mm barium tablet: Passed from oral cavity to stomach without delay Motility: Mild diffuse age-related esophageal dysmotility, age-appropriate Mucosa:  Smooth without irregularity or ulceration Hypopharynx/cervical esophagus: No laryngeal penetration or aspiration. Mild vallecular and piriform sinus residuals which were spontaneously cleared Hiatal hernia:  Absent GE reflux:  Not visualized during exam Other:  N/A IMPRESSION: Mild vallecular and piriform sinus residuals which were spontaneously cleared. Otherwise negative exam. Electronically Signed   By: Lavonia Dana M.D.   On: 06/05/2017 12:08        Scheduled Meds: . amLODipine  10 mg Oral Daily  . aspirin EC  81 mg Oral Daily  . budesonide (PULMICORT) nebulizer solution  0.25 mg Nebulization BID  . enoxaparin (LOVENOX) injection  60 mg Subcutaneous Q24H  . fluconazole  100 mg Oral Daily  . furosemide  20 mg Oral Daily  . guaiFENesin-codeine  5 mL Oral Q6H  . hydrALAZINE  25 mg Oral Q8H  . insulin aspart  0-15 Units Subcutaneous TID WC  . insulin aspart  0-5 Units Subcutaneous QHS  . insulin aspart  10 Units Subcutaneous TID WC  . insulin glargine  45 Units Subcutaneous QHS  . ipratropium-albuterol  3 mL Nebulization Q6H WA  . irbesartan  300 mg Oral Daily  . magic mouthwash  10 mL Oral TID   And  . lidocaine  10 mL Mouth/Throat TID  . mouth rinse  15 mL Mouth Rinse BID  . pantoprazole  40 mg Oral Daily  . tamsulosin  0.4 mg Oral q morning - 10a   Continuous Infusions: . ampicillin-sulbactam (UNASYN) IV 3 g (06/06/17 1611)     LOS: 7 days    Time spent: 71mins    Vincent Perdue, MD [facility] Pager [phone]  If 7PM-7AM, please contact night-coverage [email or web address] Password  [email or web address] 06/06/2017, 4:16 PM

## 2017-06-06 NOTE — Care Management Note (Signed)
Case Management Note  Patient Details  Name: Vincent Ramos MRN: [id number] Date of Birth: 1951/07/12  If discussed at Long Length of Stay Meetings, dates discussed:  06/06/2017  Additional Comments:  Rayansh Herbst, Chauncey Reading, RN 06/06/2017, 2:06 PM

## 2017-06-06 NOTE — Progress Notes (Signed)
Subjective: He says he feels a little better.  His barium study showed esophageal dysmotility.  He is still coughing some.  Objective: Vital signs in last 24 hours: Temp:  [98 F (36.7 C)-98.4 F (36.9 C)] 98 F (36.7 C) (11/06 0537) Pulse Rate:  [71-86] 71 (11/06 0537) Resp:  [18-19] 18 (11/06 0537) BP: (139-174)/(75-78) 174/78 (11/06 0537) SpO2:  [96 %-98 %] 98 % (11/06 0807) Weight change:  Last BM Date: 06/05/17  Intake/Output from previous day: 11/05 0701 - 11/06 0700 In: 1480 [P.O.:1080; IV Piggyback:400] Out: 4500 [Urine:4500]  PHYSICAL EXAM General appearance: alert, cooperative and no distress Resp: rhonchi bilaterally Cardio: regular rate and rhythm, S1, S2 normal, no murmur, click, rub or gallop GI: soft, non-tender; bowel sounds normal; no masses,  no organomegaly Extremities: extremities normal, atraumatic, no cyanosis or edema Anxious  Lab Results:  Results for orders placed or performed during the hospital encounter of 05/30/17 (from the past 48 hour(s))  Glucose, capillary     Status: Abnormal   Collection Time: 06/04/17 11:15 AM  Result Value Ref Range   Glucose-Capillary 315 (H) 65 - 99 mg/dL   Comment 1 Notify RN    Comment 2 Document in Chart   Glucose, capillary     Status: Abnormal   Collection Time: 06/04/17  4:58 PM  Result Value Ref Range   Glucose-Capillary 306 (H) 65 - 99 mg/dL   Comment 1 Notify RN    Comment 2 Document in Chart   Glucose, capillary     Status: Abnormal   Collection Time: 06/04/17  9:07 PM  Result Value Ref Range   Glucose-Capillary 326 (H) 65 - 99 mg/dL   Comment 1 Notify RN    Comment 2 Document in Chart   Glucose, capillary     Status: Abnormal   Collection Time: 06/05/17  7:38 AM  Result Value Ref Range   Glucose-Capillary 254 (H) 65 - 99 mg/dL  Glucose, capillary     Status: Abnormal   Collection Time: 06/05/17 11:23 AM  Result Value Ref Range   Glucose-Capillary 212 (H) 65 - 99 mg/dL  Glucose, capillary      Status: Abnormal   Collection Time: 06/05/17  4:37 PM  Result Value Ref Range   Glucose-Capillary 268 (H) 65 - 99 mg/dL  Glucose, capillary     Status: Abnormal   Collection Time: 06/05/17  9:15 PM  Result Value Ref Range   Glucose-Capillary 326 (H) 65 - 99 mg/dL  Basic metabolic panel     Status: Abnormal   Collection Time: 06/06/17  5:55 AM  Result Value Ref Range   Sodium 138 135 - 145 mmol/L   Potassium 3.7 3.5 - 5.1 mmol/L   Chloride 100 (L) 101 - 111 mmol/L   CO2 31 22 - 32 mmol/L   Glucose, Bld 230 (H) 65 - 99 mg/dL   BUN 17 6 - 20 mg/dL   Creatinine, Ser 0.95 0.61 - 1.24 mg/dL   Calcium 7.8 (L) 8.9 - 10.3 mg/dL   GFR calc non Af Amer >60 >60 mL/min   GFR calc Af Amer >60 >60 mL/min    Comment: (NOTE) The eGFR has been calculated using the CKD EPI equation. This calculation has not been validated in all clinical situations. eGFR's persistently <60 mL/min signify possible Chronic Kidney Disease.    Anion gap 7 5 - 15  CBC     Status: Abnormal   Collection Time: 06/06/17  5:55 AM  Result Value Ref Range  WBC 13.7 (H) 4.0 - 10.5 K/uL   RBC 4.86 4.22 - 5.81 MIL/uL   Hemoglobin 12.5 (L) 13.0 - 17.0 g/dL   HCT 39.9 39.0 - 52.0 %   MCV 82.1 78.0 - 100.0 fL   MCH 25.7 (L) 26.0 - 34.0 pg   MCHC 31.3 30.0 - 36.0 g/dL   RDW 14.9 11.5 - 15.5 %   Platelets 199 150 - 400 K/uL    Comment: SPECIMEN CHECKED FOR CLOTS GIANT PLATELETS SEEN PLATELET COUNT CONFIRMED BY SMEAR   Glucose, capillary     Status: Abnormal   Collection Time: 06/06/17  7:50 AM  Result Value Ref Range   Glucose-Capillary 159 (H) 65 - 99 mg/dL   Comment 1 Notify RN    Comment 2 Document in Chart     ABGS No results for input(s): PHART, PO2ART, TCO2, HCO3 in the last 72 hours.  Invalid input(s): PCO2 CULTURES Recent Results (from the past 240 hour(s))  Blood culture (routine x 2)     Status: None   Collection Time: 05/30/17  6:23 PM  Result Value Ref Range Status   Specimen Description BLOOD  RIGHT FOREARM  Final   Special Requests   Final    BOTTLES DRAWN AEROBIC AND ANAEROBIC Blood Culture adequate volume   Culture NO GROWTH 5 DAYS  Final   Report Status 06/04/2017 FINAL  Final  Blood culture (routine x 2)     Status: None   Collection Time: 05/30/17  6:23 PM  Result Value Ref Range Status   Specimen Description BLOOD RIGHT HAND  Final   Special Requests   Final    BOTTLES DRAWN AEROBIC AND ANAEROBIC Blood Culture adequate volume   Culture NO GROWTH 5 DAYS  Final   Report Status 06/04/2017 FINAL  Final  MRSA PCR Screening     Status: None   Collection Time: 06/01/17  3:31 AM  Result Value Ref Range Status   MRSA by PCR NEGATIVE NEGATIVE Final    Comment:        The GeneXpert MRSA Assay (FDA approved for NASAL specimens only), is one component of a comprehensive MRSA colonization surveillance program. It is not intended to diagnose MRSA infection nor to guide or monitor treatment for MRSA infections.    Studies/Results: Dg Esophagus  Result Date: 06/05/2017 CLINICAL DATA:  Cervical esophageal dysphagia, recurrent pneumonia EXAM: ESOPHOGRAM / BARIUM SWALLOW / BARIUM TABLET STUDY TECHNIQUE: Combined double contrast and single contrast examination performed using effervescent crystals, thick barium liquid, and thin barium liquid. The patient was observed with fluoroscopy swallowing a 13 mm barium sulphate tablet. FLUOROSCOPY TIME:  Fluoroscopy Time:  1 minutes 42 seconds Radiation Exposure Index (if provided by the fluoroscopic device): 51.6 mGy Number of Acquired Spot Images: multiple fluoroscopic screen captures COMPARISON:  None FINDINGS: Esophageal distention: Normal distention without mass or stricture Filling defects:  None 12.5 mm barium tablet: Passed from oral cavity to stomach without delay Motility: Mild diffuse age-related esophageal dysmotility, age-appropriate Mucosa:  Smooth without irregularity or ulceration Hypopharynx/cervical esophagus: No laryngeal  penetration or aspiration. Mild vallecular and piriform sinus residuals which were spontaneously cleared Hiatal hernia:  Absent GE reflux:  Not visualized during exam Other:  N/A IMPRESSION: Mild vallecular and piriform sinus residuals which were spontaneously cleared. Otherwise negative exam. Electronically Signed   By: Lavonia Dana M.D.   On: 06/05/2017 12:08    Medications:  Prior to Admission:  Medications Prior to Admission  Medication Sig Dispense Refill Last Dose  .  amLODipine (NORVASC) 10 MG tablet Take 10 mg by mouth daily.   Past Month at Unknown time  . aspirin EC 81 MG tablet Take 81 mg by mouth daily.   Past Month at Unknown time  . DM-APAP-CPM (FLU HBP) 15-500-2 MG TABS Take 1-2 tablets by mouth daily as needed (for flu symptoms).   Past Week at Unknown time  . furosemide (LASIX) 20 MG tablet Take 20 mg by mouth daily.   05/30/2017 at Unknown time  . glipiZIDE (GLUCOTROL) 10 MG tablet Take 10 mg by mouth 2 (two) times daily before a meal.   05/30/2017 at Unknown time  . Insulin Degludec (TRESIBA FLEXTOUCH) 200 UNIT/ML SOPN Inject 50 Units into the skin at bedtime.    05/30/2017 at Unknown time  . metFORMIN (GLUCOPHAGE) 1000 MG tablet Take 1,000 mg by mouth 2 (two) times daily.   05/30/2017 at Unknown time  . tamsulosin (FLOMAX) 0.4 MG CAPS capsule Take 1 capsule by mouth every morning.    05/30/2017 at Unknown time  . TRULICITY 1.5 MG/5.0IB SOPN Inject 1.5 mg into the skin once a week. Takes on Mondays/Tuesdays   05/22/2017 at Unknown time  . valsartan-hydrochlorothiazide (DIOVAN-HCT) 320-12.5 MG tablet Take 1 tablet by mouth daily.   05/30/2017 at Unknown time   Scheduled: . amLODipine  10 mg Oral Daily  . aspirin EC  81 mg Oral Daily  . budesonide (PULMICORT) nebulizer solution  0.25 mg Nebulization BID  . enoxaparin (LOVENOX) injection  60 mg Subcutaneous Q24H  . fluconazole  100 mg Oral Daily  . furosemide  20 mg Oral Daily  . guaiFENesin-codeine  5 mL Oral Q6H  .  hydrALAZINE  25 mg Oral Q8H  . insulin aspart  0-15 Units Subcutaneous TID WC  . insulin aspart  0-5 Units Subcutaneous QHS  . insulin aspart  10 Units Subcutaneous TID WC  . insulin glargine  45 Units Subcutaneous QHS  . ipratropium-albuterol  3 mL Nebulization Q6H WA  . irbesartan  300 mg Oral Daily  . magic mouthwash  10 mL Oral TID   And  . lidocaine  10 mL Mouth/Throat TID  . mouth rinse  15 mL Mouth Rinse BID  . predniSONE  40 mg Oral Q breakfast  . tamsulosin  0.4 mg Oral q morning - 10a   Continuous: . ampicillin-sulbactam (UNASYN) IV Stopped (06/06/17 0530)   BCW:[id number], albuterol, famotidine, hydrALAZINE, labetalol  Assesment: He was admitted with community-acquired pneumonia and has had acute hypoxic respiratory failure.  He has esophageal dysphagia and esophageal dysmotility.  He did not have overt aspiration.  He is better as far as his pneumonia is concerned and I do not know if we can do anything about his dysmotility. Principal Problem:   CAP (community acquired pneumonia) Active Problems:   Diabetes ([place])   Hypertension   Obesity   Acute respiratory failure with hypoxia ([place])   Dysphagia    Plan: Continue treatments.  I will plan to follow more peripherally in    LOS: 7 days   Rhylynn Perdomo L 06/06/2017, 8:40 AM

## 2017-06-06 NOTE — Progress Notes (Addendum)
This is a late entry progress note.  I saw him on 06/05/2007 but forgot to dictate my progress note.  At that point he was sitting up in the bed said he felt better.  His breathing had improved.  He still complained of trouble when he tried to swallow with pain and discomfort in the midsternal area.  I reviewed medication list and PMH, SH, FH  Physical examination general appearance shows that he is awake and alert obese in no distress.  Respiratory showed that he had bilateral rhonchi but no wheezing.  Cardiovascular showed regular rate and rhythm with no edema now.  Gastrointestinal show that his abdomen was soft with no masses.  Extremities showed no clubbing cyanosis or edema.  Psychiatric showed that he was anxious.  He was admitted with pneumonia.  He then developed acute hypoxic respiratory failure requiring BiPAP briefly.  He is improving on current treatment.  He has some sort of GI issues with his esophagus and he is scheduled for barium evaluation of that tomorrow.  Plan is to continue current treatments

## 2017-06-06 NOTE — Progress Notes (Signed)
Pt is in no distress. BIPAP is not in room. Pt encouraged to call should he need BIPAP

## 2017-06-07 ENCOUNTER — Inpatient Hospital Stay (HOSPITAL_COMMUNITY): Payer: PPO | Admitting: Anesthesiology

## 2017-06-07 ENCOUNTER — Encounter (HOSPITAL_COMMUNITY): Payer: Self-pay | Admitting: *Deleted

## 2017-06-07 ENCOUNTER — Encounter (HOSPITAL_COMMUNITY)
Admission: EM | Disposition: A | Discharge status: Home / Self Care | Payer: Self-pay | Source: Home / Self Care | Attending: Internal Medicine

## 2017-06-07 DIAGNOSIS — J9601 Acute respiratory failure with hypoxia: Secondary | ICD-10-CM

## 2017-06-07 DIAGNOSIS — J690 Pneumonitis due to inhalation of food and vomit: Principal | ICD-10-CM

## 2017-06-07 DIAGNOSIS — I10 Essential (primary) hypertension: Secondary | ICD-10-CM

## 2017-06-07 DIAGNOSIS — J189 Pneumonia, unspecified organism: Secondary | ICD-10-CM

## 2017-06-07 DIAGNOSIS — E119 Type 2 diabetes mellitus without complications: Secondary | ICD-10-CM

## 2017-06-07 HISTORY — PX: ESOPHAGEAL DILATION: SHX303

## 2017-06-07 HISTORY — PX: ESOPHAGOGASTRODUODENOSCOPY (EGD) WITH PROPOFOL: SHX5813

## 2017-06-07 LAB — GLUCOSE, CAPILLARY
GLUCOSE-CAPILLARY: 55 mg/dL — AB (ref 65–99)
GLUCOSE-CAPILLARY: 107 mg/dL — AB (ref 65–99)
Glucose-Capillary: 482 mg/dL — ABNORMAL HIGH (ref 65–99)
Glucose-Capillary: 109 mg/dL — ABNORMAL HIGH (ref 65–99)
Glucose-Capillary: 80 mg/dL (ref 65–99)
Glucose-Capillary: 52 mg/dL — ABNORMAL LOW (ref 65–99)
Glucose-Capillary: 85 mg/dL (ref 65–99)
Glucose-Capillary: 191 mg/dL — ABNORMAL HIGH (ref 65–99)
Glucose-Capillary: 362 mg/dL — ABNORMAL HIGH (ref 65–99)

## 2017-06-07 SURGERY — ESOPHAGOGASTRODUODENOSCOPY (EGD) WITH PROPOFOL
Anesthesia: Monitor Anesthesia Care

## 2017-06-07 MED ORDER — SUCRALFATE 1 GM/10ML PO SUSP
1.0000 g | Freq: Three times a day (TID) | ORAL | Status: DC
  Administered 2017-06-07 – 2017-06-08 (×4): 1 g via ORAL
  Filled 2017-06-07 (×5): qty 10

## 2017-06-07 MED ORDER — MIDAZOLAM HCL 2 MG/2ML IJ SOLN
INTRAMUSCULAR | Status: AC
  Filled 2017-06-07: qty 2

## 2017-06-07 MED ORDER — SODIUM CHLORIDE 0.9 % IV SOLN: INTRAVENOUS | Status: DC

## 2017-06-07 MED ORDER — DEXTROSE 50 % IV SOLN
INTRAVENOUS | Status: AC
  Filled 2017-06-07: qty 50

## 2017-06-07 MED ORDER — LIDOCAINE VISCOUS 2 % MT SOLN
15.0000 mL | Freq: Once | OROMUCOSAL | Status: AC
  Administered 2017-06-07: 3 mL via OROMUCOSAL

## 2017-06-07 MED ORDER — GLUCOSE 40 % PO GEL
ORAL | Status: AC
  Administered 2017-06-07: 37.5 g
  Filled 2017-06-07: qty 1

## 2017-06-07 MED ORDER — INSULIN GLARGINE 100 UNIT/ML ~~LOC~~ SOLN
40.0000 [IU] | Freq: Every day | SUBCUTANEOUS | Status: DC
  Administered 2017-06-07: 40 [IU] via SUBCUTANEOUS
  Filled 2017-06-07 (×2): qty 0.4

## 2017-06-07 MED ORDER — DEXTROSE 50 % IV SOLN: 25.0000 mL | Freq: Once | INTRAVENOUS | Status: DC

## 2017-06-07 MED ORDER — MIDAZOLAM HCL 2 MG/2ML IJ SOLN
1.0000 mg | Freq: Once | INTRAMUSCULAR | Status: AC | PRN
  Administered 2017-06-07: 2 mg via INTRAVENOUS

## 2017-06-07 MED ORDER — PROPOFOL 500 MG/50ML IV EMUL
INTRAVENOUS | Status: DC | PRN
  Administered 2017-06-07: 75 ug/kg/min via INTRAVENOUS

## 2017-06-07 MED ORDER — DEXTROSE 50 % IV SOLN
INTRAVENOUS | Status: DC | PRN
  Administered 2017-06-07: 25 mL via INTRAVENOUS

## 2017-06-07 MED ORDER — PROPOFOL 10 MG/ML IV BOLUS
INTRAVENOUS | Status: AC
  Filled 2017-06-07: qty 40

## 2017-06-07 MED ORDER — LIDOCAINE VISCOUS 2 % MT SOLN
OROMUCOSAL | Status: AC
  Filled 2017-06-07: qty 15

## 2017-06-07 MED ORDER — PHENOL 1.4 % MT LIQD
1.0000 | OROMUCOSAL | Status: DC | PRN
Start: 2017-06-07 — End: 2017-06-08
  Administered 2017-06-07: 1 via OROMUCOSAL
  Filled 2017-06-07: qty 177

## 2017-06-07 MED ORDER — LACTATED RINGERS IV SOLN
INTRAVENOUS | Status: DC
Start: 2017-06-07 — End: 2017-06-07
  Administered 2017-06-07: 13:00:00 via INTRAVENOUS

## 2017-06-07 MED ORDER — MIDAZOLAM HCL 5 MG/5ML IJ SOLN
INTRAMUSCULAR | Status: DC | PRN
  Administered 2017-06-07: 2 mg via INTRAVENOUS

## 2017-06-07 NOTE — Plan of Care (Signed)
Continue current plan of care.

## 2017-06-07 NOTE — Op Note (Signed)
[facility] Patient Name: Vincent Ramos Procedure Date: 06/07/2017 12:21 PM MRN: [id number] Date of Birth: 1951/05/10 Attending MD: Norvel Richards , MD CSN: [id number] Age: 66 Admit Type: Inpatient Procedure:                Upper GI endoscopy Indications:              Dysphagia Providers:                Norvel Richards, MD, Lurline Del, RN, Aram Candela Referring MD:              Medicines:                Propofol per Anesthesia Complications:            No immediate complications. Estimated Blood Loss:     Estimated blood loss was minimal. Procedure:                Pre-Anesthesia Assessment:                           - Prior to the procedure, a History and Physical                            was performed, and patient medications and                            allergies were reviewed. The patient's tolerance of                            previous anesthesia was also reviewed. The risks                            and benefits of the procedure and the sedation                            options and risks were discussed with the patient.                            All questions were answered, and informed consent                            was obtained. Prior Anticoagulants: The patient has                            taken no previous anticoagulant or antiplatelet                            agents. ASA Grade Assessment: II - A patient with                            mild systemic disease. After reviewing the risks  and benefits, the patient was deemed in                            satisfactory condition to undergo the procedure.                           After obtaining informed consent, the endoscope was                            passed under direct vision. Throughout the                            procedure, the patient's blood pressure, pulse, and                            oxygen saturations were monitored  continuously. The                            NO-[id number] [id number]) scope was introduced through the                            and advanced to the second part of duodenum. The                            upper GI endoscopy was accomplished without                            difficulty. The patient tolerated the procedure                            well. Scope In: 2:21:13 PM Scope Out: 2:28:44 PM Total Procedure Duration: 0 hours 7 minutes 31 seconds  Findings:      The examined esophagus was normal. was examined and showed no change.       Estimated blood loss was minimal.      The entire examined stomach was normal.      The duodenal bulb and second portion of the duodenum were normal. The       scope was withdrawn. Dilation was performed with a Maloney dilator with       no resistance at 56 Fr. The scope was withdrawn. Dilation was performed       with a Maloney dilator with no resistance at 67 Fr. The scope was       withdrawn. Dilation was performed with a Maloney dilator with mild       resistance at 60 Fr. The dilation site was examined and showed no change       aside from a superficial tear through the UES.. Estimated blood loss was       minimal Impression:               - Normal esophagus. Dilated.                           - Normal stomach.                           -  Normal duodenal bulb and second portion of the                            duodenum.                           - No specimens collected. Moderate Sedation:      Moderate (conscious) sedation was personally administered by an       anesthesia professional. The following parameters were monitored: oxygen       saturation, heart rate, blood pressure, respiratory rate, EKG, adequacy       of pulmonary ventilation, and response to care. Total physician       intraservice time was 12 minutes. Recommendation:           - Patient has a contact number available for                            emergencies. The signs and  symptoms of potential                            delayed complications were discussed with the                            patient. Return to normal activities tomorrow.                            Written discharge instructions were provided to the                            patient.                           - Return patient to hospital ward for ongoing care.                            Continue PPI daily. Carafate QID x 3 days                           - Diabetic (ADA) diet.                           - Continue present medications.                           - No repeat upper endoscopy.                           - Return to GI office in 6 weeks. Procedure Code(s):        --- Professional ---                           [phone], Esophagogastroduodenoscopy, flexible,                            transoral; diagnostic, including collection of  specimen(s) by brushing or washing, when performed                            (separate procedure)                           43450, Dilation of esophagus, by unguided sound or                            bougie, single or multiple passes Diagnosis Code(s):        --- Professional ---                           R13.10, Dysphagia, unspecified CPT copyright 2016 American Medical Association. All rights reserved. The codes documented in this report are preliminary and upon coder review may  be revised to meet current compliance requirements. Cristopher Estimable. Rourk, MD Norvel Richards, MD 06/07/2017 2:45:47 PM This report has been signed electronically. Number of Addenda: 0

## 2017-06-07 NOTE — Progress Notes (Signed)
PROGRESS NOTE    Vincent Ramos  ZDG:[id number] DOB: [birth year] DOA: 05/30/2017 PCP: [name], MD    Brief Narrative:  67 year old male with a history of hypertension and diabetes, presented to the emergency room with complaints of shortness of breath chest pain.  Found to have evidence of pneumonia and started on intravenous antibiotics.  On 11/1, respiratory status decompensated and he required transfer to stepdown for bipap therapy. Pulmonology consulted.  It was felt the patient likely had aspiration pneumonia.  With antibiotics and pulmonary hygiene, his overall respiratory status has improved.  He is now on room air and breathing comfortably.  Continues to have significant problems with eating and drinking.  Describes dysphagia.  GI  has been consulted to see if endoscopy is necessary.  Anticipate discharge home in the next 24-48 hours.   Assessment & Plan:   Active Problems:   Diabetes ([place])   Hypertension   Obesity   Acute respiratory failure with hypoxia ([facility])   Dysphagia   Aspiration pneumonia ([place])   1. Aspiration pneumonia.  Chest x-ray showed left lower lobe infiltrate. He was initially started on Rocephin and azithromycin.  Shortly after admission, his respiratory status decompensated and repeat chest xray showed worsening pneumonia on left side. Patient was also noted to be having a fever.  It is likely that he had an aspiration event that led to decompensation.  Antibiotics were changed from ceftriaxone to unasyn for better anaerobic coverage. Clinically, he appears to be improving. CT chest showed pneumonia. Continue on bronchodilators.  Appreciate Dr. Luan Pulling assistance.  He was briefly placed on steroids, but since wheezing has improved, steroids have now been discontinued. Seen by speech therapy and concern for esophageal dysmotility.  He underwent esophagram that showed mild vallecular and pyriform sinus residuals which were spontaneously cleared.  Since the patient  was continuing to have difficulty with eating solids and liquids and feels that this gets caught in his chest, GI has been consulted to see if endoscopy is necessary.  EGD is planned for today and possible MBS pending.  2. Chest pain.  Likely pleuritic from pneumonia.  He has ruled out for ACS with negative cardiac markers. 3. Diabetes.  Hyperglycemia likely related to steroids. Lantus and NovoLog adjusted.  Continue sliding scale insulin.  Anticipate blood sugar should improve as steroids are tapered. 4. Hypertension.  Blood pressures have been stable.  Continue Norvasc, valsartan and hydrochlorothiazide. 5. Acute respiratory failure with hypoxia.  Related to pneumonia.  He had intermittently required BiPAP, but now on nasal cannula. Weaning off oxygen as tolerated.   DVT prophylaxis: Lovenox Code Status: Full code Family Communication: No family present Disposition Plan: Discharge home soom   Consultants:   Pulmonology  Speech therapy  gastroenterology  Procedures:     Antimicrobials:   Rocephin 10/30 > 11/1  Azithromycin 10/30 >11/5  Unasyn 11/1>   Subjective: Pt wants to eat but NPO for procedure (EGD)  Objective: Vitals:   06/07/17 0302 06/07/17 0600 06/07/17 0818 06/07/17 0820  BP:  (!) 166/73    Pulse:      Resp:  18    Temp:  98.4 F (36.9 C)    TempSrc:  Oral    SpO2: 96% 100% 97% 98%  Weight:      Height:        Intake/Output Summary (Last 24 hours) at 06/07/2017 0913 Last data filed at 06/07/2017 0600 Gross per 24 hour  Intake 810 ml  Output 3005 ml  Net -  2195 ml   Filed Weights   05/30/17 2015 06/01/17 0310 06/02/17 0500  Weight: 123.5 kg (272 lb 3.2 oz) 125.7 kg (277 lb 1.9 oz) 123.1 kg (271 lb 6.2 oz)    Examination:  General exam: Appears calm and comfortable  Respiratory system: Clear to auscultation. Increased respiratory effort. Cardiovascular system: S1 & S2 heard normal, No JVD, murmurs, rubs, gallops or clicks. trace pretibial and  pedal edema. Gastrointestinal system: Abdomen is nondistended, soft and nontender. No organomegaly or masses felt. Normal bowel sounds heard. Central nervous system: Alert and oriented. No focal neurological deficits. Extremities: Symmetric 5 x 5 power. Skin: No rashes, lesions or ulcers Psychiatry: Judgement and insight appear normal. Mood & affect appropriate.   Data Reviewed: I have personally reviewed following labs and imaging studies  CBC: Recent Labs  Lab 06/02/17 0405 06/06/17 0555  WBC 13.1* 13.7*  HGB 13.6 12.5*  HCT 42.8 39.9  MCV 82.6 82.1  PLT 169 672   Basic Metabolic Panel: Recent Labs  Lab 06/02/17 0405 06/04/17 0613 06/06/17 0555  NA 142 139 138  K 4.0 4.0 3.7  CL 101 100* 100*  CO2 30 31 31   GLUCOSE 284* 293* 230*  BUN 18 24* 17  CREATININE 1.10 1.02 0.95  CALCIUM 8.7* 8.4* 7.8*  MG  --  2.5*  --    GFR: Estimated Creatinine Clearance: 98.8 mL/min (by C-G formula based on SCr of 0.95 mg/dL). Liver Function Tests: No results for input(s): AST, ALT, ALKPHOS, BILITOT, PROT, ALBUMIN in the last 168 hours. No results for input(s): LIPASE, AMYLASE in the last 168 hours. No results for input(s): AMMONIA in the last 168 hours. Coagulation Profile: No results for input(s): INR, PROTIME in the last 168 hours. Cardiac Enzymes: Recent Labs  Lab 06/01/17 0402  TROPONINI 0.07*   BNP (last 3 results) No results for input(s): PROBNP in the last 8760 hours. HbA1C: No results for input(s): HGBA1C in the last 72 hours. CBG: Recent Labs  Lab 06/05/17 2115 06/06/17 0750 06/06/17 1134 06/06/17 1612 06/07/17 0756  GLUCAP 326* 159* 81 196* 109*   Lipid Profile: No results for input(s): CHOL, HDL, LDLCALC, TRIG, CHOLHDL, LDLDIRECT in the last 72 hours. Thyroid Function Tests: No results for input(s): TSH, T4TOTAL, FREET4, T3FREE, THYROIDAB in the last 72 hours. Anemia Panel: No results for input(s): VITAMINB12, FOLATE, FERRITIN, TIBC, IRON, RETICCTPCT in  the last 72 hours. Sepsis Labs: No results for input(s): PROCALCITON, LATICACIDVEN in the last 168 hours.  Recent Results (from the past 240 hour(s))  Blood culture (routine x 2)     Status: None   Collection Time: 05/30/17  6:23 PM  Result Value Ref Range Status   Specimen Description BLOOD RIGHT FOREARM  Final   Special Requests   Final    BOTTLES DRAWN AEROBIC AND ANAEROBIC Blood Culture adequate volume   Culture NO GROWTH 5 DAYS  Final   Report Status 06/04/2017 FINAL  Final  Blood culture (routine x 2)     Status: None   Collection Time: 05/30/17  6:23 PM  Result Value Ref Range Status   Specimen Description BLOOD RIGHT HAND  Final   Special Requests   Final    BOTTLES DRAWN AEROBIC AND ANAEROBIC Blood Culture adequate volume   Culture NO GROWTH 5 DAYS  Final   Report Status 06/04/2017 FINAL  Final  MRSA PCR Screening     Status: None   Collection Time: 06/01/17  3:31 AM  Result Value Ref Range Status  MRSA by PCR NEGATIVE NEGATIVE Final    Comment:        The GeneXpert MRSA Assay (FDA approved for NASAL specimens only), is one component of a comprehensive MRSA colonization surveillance program. It is not intended to diagnose MRSA infection nor to guide or monitor treatment for MRSA infections.     Radiology Studies: Dg Esophagus  Result Date: 06/05/2017 CLINICAL DATA:  Cervical esophageal dysphagia, recurrent pneumonia EXAM: ESOPHOGRAM / BARIUM SWALLOW / BARIUM TABLET STUDY TECHNIQUE: Combined double contrast and single contrast examination performed using effervescent crystals, thick barium liquid, and thin barium liquid. The patient was observed with fluoroscopy swallowing a 13 mm barium sulphate tablet. FLUOROSCOPY TIME:  Fluoroscopy Time:  1 minutes 42 seconds Radiation Exposure Index (if provided by the fluoroscopic device): 51.6 mGy Number of Acquired Spot Images: multiple fluoroscopic screen captures COMPARISON:  None FINDINGS: Esophageal distention: Normal  distention without mass or stricture Filling defects:  None 12.5 mm barium tablet: Passed from oral cavity to stomach without delay Motility: Mild diffuse age-related esophageal dysmotility, age-appropriate Mucosa:  Smooth without irregularity or ulceration Hypopharynx/cervical esophagus: No laryngeal penetration or aspiration. Mild vallecular and piriform sinus residuals which were spontaneously cleared Hiatal hernia:  Absent GE reflux:  Not visualized during exam Other:  N/A IMPRESSION: Mild vallecular and piriform sinus residuals which were spontaneously cleared. Otherwise negative exam. Electronically Signed   By: Lavonia Dana M.D.   On: 06/05/2017 12:08   Scheduled Meds: . amLODipine  10 mg Oral Daily  . aspirin EC  81 mg Oral Daily  . budesonide (PULMICORT) nebulizer solution  0.25 mg Nebulization BID  . enoxaparin (LOVENOX) injection  60 mg Subcutaneous Q24H  . fluconazole  100 mg Oral Daily  . furosemide  20 mg Oral Daily  . guaiFENesin-codeine  5 mL Oral Q6H  . hydrALAZINE  25 mg Oral Q8H  . insulin aspart  0-15 Units Subcutaneous TID WC  . insulin aspart  0-5 Units Subcutaneous QHS  . insulin glargine  45 Units Subcutaneous QHS  . ipratropium-albuterol  3 mL Nebulization Q6H WA  . irbesartan  300 mg Oral Daily  . magic mouthwash  10 mL Oral TID   And  . lidocaine  10 mL Mouth/Throat TID  . mouth rinse  15 mL Mouth Rinse BID  . pantoprazole  40 mg Oral Daily  . tamsulosin  0.4 mg Oral q morning - 10a   Continuous Infusions: . ampicillin-sulbactam (UNASYN) IV Stopped (06/07/17 0306)     LOS: 8 days    Time spent: 39mins  Irwin Brakeman, MD [facility] Pager [phone]  If 7PM-7AM, please contact night-coverage [email or web address] Password [email or web address] 06/07/2017, 9:13 AM

## 2017-06-07 NOTE — Anesthesia Preprocedure Evaluation (Signed)
Anesthesia Evaluation  Patient identified by MRN, date of birth, ID band Patient awake    Airway Mallampati: III  TM Distance: >3 FB Neck ROM: Full  Mouth opening: Limited Mouth Opening  Dental  (+) Teeth Intact   Pulmonary pneumonia, resolved,    Pulmonary exam normal breath sounds clear to auscultation       Cardiovascular Exercise Tolerance: Poor hypertension, Pt. on medications Normal cardiovascular exam Rhythm:Regular Rate:Normal  - Left ventricle: The cavity size was normal. Wall thickness was   increased in a pattern of moderate LVH. Systolic function was   normal. The estimated ejection fraction was in the range of 60%   to 65%. Although no diagnostic regional wall motion abnormality   was identified, this possibility cannot be completely excluded on   the basis of this study. Features are consistent with a   pseudonormal left ventricular filling pattern, with concomitant   abnormal relaxation and increased filling pressure (grade 2   diastolic dysfunction). (Feb 2017)  Impression Exercise Capacity:  Lexiscan with no exercise. BP Response:  Normal blood pressure response. Clinical Symptoms:  No significant symptoms noted. ECG Impression:  No significant ECG changes with Lexiscan. Comparison with Prior Nuclear Study: No previous nuclear study performed   (Oct 2014)  ECG: Normal sinus rhythm RSR' or QR pattern in V1 suggests right ventricular conduction delay Septal infarct , age undetermined Abnormal ECG  (OCT 2018)   Neuro/Psych    GI/Hepatic   Endo/Other  diabetes, Poorly Controlled, Type 2, Oral Hypoglycemic Agents  Renal/GU Results for LENZY, KERSCHNER (MRN [id number]) as of 06/07/2017 12:18  06/06/2017 05:55 Sodium: 138 Potassium: 3.7 Chloride: 100 (L) CO2: 31 Glucose: 230 (H) BUN: 17 Creatinine: 0.95      Musculoskeletal   Abdominal Normal abdominal exam  (+)   Peds  Hematology Results for  JARID, SASSO (MRN [id number]) as of 06/07/2017 12:18  06/06/2017 05:55 Hemoglobin: 12.5 (L) HCT: 39.9 MCV: 82.1 MCH: 25.7 (L) MCHC: 31.3 RDW: 14.9 Platelets: 199    Anesthesia Other Findings   Reproductive/Obstetrics                             Anesthesia Physical Anesthesia Plan  ASA: IV  Anesthesia Plan: MAC   Post-op Pain Management:    Induction:   PONV Risk Score and Plan: 1  Airway Management Planned: Simple Face Mask  Additional Equipment:   Intra-op Plan:   Post-operative Plan:   Informed Consent: I have reviewed the patients History and Physical, chart, labs and discussed the procedure including the risks, benefits and alternatives for the proposed anesthesia with the patient or authorized representative who has indicated his/her understanding and acceptance.   Dental advisory given  Plan Discussed with: CRNA  Anesthesia Plan Comments:         Anesthesia Quick Evaluation

## 2017-06-07 NOTE — Progress Notes (Signed)
Pt seen and examined in short stay.  Agree with EGD/ED.  The risks, benefits, limitations, alternatives and imponderables have been reviewed with the patient. Potential for esophageal dilation, biopsy, etc. have also been reviewed.  Questions have been answered. All parties agreeable.

## 2017-06-07 NOTE — Progress Notes (Addendum)
Pt is in no distress. BIPAP is not in room. BIPAP is not needed at this time

## 2017-06-07 NOTE — Progress Notes (Signed)
Patient briefly seen today to reassess for appropriateness for EGD. States his breathing is at baseline. He is feeling much improved. Chest exam without wheezing. O2sat have been in the upper 90s/100% this morning. Will proceed with EGD/ED in OR for dysphagia as planned.   Laureen Ochs. Bernarda Caffey [facility] [phone] 11/7/20189:56 AM  Addendum: last lovenox (60mg  [place]) received 06/06/17 at 2200.  Laureen Ochs. Bernarda Caffey [facility] [phone] 11/7/201811:33 AM

## 2017-06-07 NOTE — Transfer of Care (Signed)
Immediate Anesthesia Transfer of Care Note  Patient: Vincent Ramos  Procedure(s) Performed: ESOPHAGOGASTRODUODENOSCOPY (EGD) WITH PROPOFOL (N/A ) ESOPHAGEAL DILATION (N/A )  Patient Location: PACU  Anesthesia Type:MAC  Level of Consciousness: drowsy  Airway & Oxygen Therapy: Patient Spontanous Breathing and Patient connected to face mask oxygen  Post-op Assessment: Report given to RN and Post -op Vital signs reviewed and stable  Post vital signs: Reviewed and stable  Last Vitals:  Vitals:   06/07/17 1003 06/07/17 1222  BP: (!) 156/70   Pulse:  68  Resp:    Temp:  36.7 C  SpO2:      Last Pain:  Vitals:   06/07/17 1222  TempSrc: Oral  PainSc: 0-No pain      Patients Stated Pain Goal: 5 (82/99/37 1696)  Complications: No apparent anesthesia complications

## 2017-06-08 ENCOUNTER — Encounter (HOSPITAL_COMMUNITY): Payer: Self-pay | Admitting: Family Medicine

## 2017-06-08 LAB — GLUCOSE, CAPILLARY
GLUCOSE-CAPILLARY: 252 mg/dL — AB (ref 65–99)
Glucose-Capillary: 137 mg/dL — ABNORMAL HIGH (ref 65–99)

## 2017-06-08 MED ORDER — SUCRALFATE 1 G PO TABS: 1.0000 g | ORAL_TABLET | Freq: Three times a day (TID) | ORAL | 0 refills | Status: DC

## 2017-06-08 MED ORDER — AMOXICILLIN-POT CLAVULANATE 875-125 MG PO TABS
1.0000 | ORAL_TABLET | Freq: Two times a day (BID) | ORAL | Status: DC
  Administered 2017-06-08: 1 via ORAL
  Filled 2017-06-08: qty 1

## 2017-06-08 MED ORDER — PANTOPRAZOLE SODIUM 40 MG PO TBEC: 40.0000 mg | DELAYED_RELEASE_TABLET | Freq: Every day | ORAL | 0 refills | Status: DC

## 2017-06-08 MED ORDER — IPRATROPIUM-ALBUTEROL 0.5-2.5 (3) MG/3ML IN SOLN: 3.0000 mL | Freq: Four times a day (QID) | RESPIRATORY_TRACT | 0 refills | Status: DC

## 2017-06-08 MED ORDER — AMOXICILLIN-POT CLAVULANATE 875-125 MG PO TABS: 1.0000 | ORAL_TABLET | Freq: Two times a day (BID) | ORAL | 0 refills | Status: AC

## 2017-06-08 NOTE — Care Management Note (Signed)
Case Management Note  Patient Details  Name: Vincent Ramos MRN: [id number] Date of Birth: 06/22/1951   If discussed at Long Length of Stay Meetings, dates discussed:  06/08/2017  Additional Comments:  Tanairy Payeur, Chauncey Reading, RN 06/08/2017, 12:05 PM

## 2017-06-08 NOTE — Anesthesia Postprocedure Evaluation (Signed)
Anesthesia Post Note  Patient: Vincent Ramos  Procedure(s) Performed: ESOPHAGOGASTRODUODENOSCOPY (EGD) WITH PROPOFOL (N/A ) ESOPHAGEAL DILATION (N/A )  Patient location during evaluation: Nursing Unit Anesthesia Type: MAC Level of consciousness: awake and alert Pain management: satisfactory to patient Vital Signs Assessment: post-procedure vital signs reviewed and stable Respiratory status: spontaneous breathing Postop Assessment: no apparent nausea or vomiting Anesthetic complications: no     Last Vitals:  Vitals:   06/08/17 0606 06/08/17 0817  BP: (!) 170/84   Pulse: 73   Resp: 18   Temp: 37.1 C   SpO2: 94% 95%    Last Pain:  Vitals:   06/08/17 0929  TempSrc:   PainSc: 0-No pain                 Ely Spragg

## 2017-06-08 NOTE — Care Management (Signed)
    Durable Medical Equipment  (From admission, onward)        Start     Ordered   06/08/17 1051  For home use only DME Nebulizer machine  Once    Question:  Patient needs a nebulizer to treat with the following condition  Answer:  COPD (chronic obstructive pulmonary disease) ([place])   06/08/17 1050

## 2017-06-08 NOTE — Care Management Note (Signed)
Case Management Note  Patient Details  Name: Vincent Ramos MRN: [id number] Date of Birth: May 21, 1951    Expected Discharge Date:  06/08/17               Expected Discharge Plan:  Home/Self Care  In-House Referral:     Discharge planning Services  CM Consult  Post Acute Care Choice:  Durable Medical Equipment Choice offered to:  Patient  DME Arranged:  Nebulizer/meds DME Agency:  [place]:    [facility] Agency:     Status of Service:  Completed, signed off  If discussed at [place] of Stay Meetings, dates discussed:    Additional Comments: Patient discharging home today. Neb machine ordered with [facility]. Will be delivered to room prior to discharge. Sleep study ordered per pulmonology request, CM verified this with attending as well .   Zahriah Roes, Chauncey Reading, RN 06/08/2017, 11:11 AM

## 2017-06-08 NOTE — Discharge Instructions (Signed)
Follow with Primary MD  Hall, John Z, MD  and other consultant's as instructed your Hospitalist MD ° °Please get a complete blood count and chemistry panel checked by your Primary MD at your next visit, and again as instructed by your Primary MD. ° °Get Medicines reviewed and adjusted: °Please take all your medications with you for your next visit with your Primary MD ° °Laboratory/radiological data: °Please request your Primary MD to go over all hospital tests and procedure/radiological results at the follow up, please ask your Primary MD to get all Hospital records sent to his/her office. ° °In some cases, they will be blood work, cultures and biopsy results pending at the time of your discharge. Please request that your primary care M.D. follows up on these results. ° °Also Note the following: °If you experience worsening of your admission symptoms, develop shortness of breath, life threatening emergency, suicidal or homicidal thoughts you must seek medical attention immediately by calling 911 or calling your MD immediately  if symptoms less severe. ° °You must read complete instructions/literature along with all the possible adverse reactions/side effects for all the Medicines you take and that have been prescribed to you. Take any new Medicines after you have completely understood and accpet all the possible adverse reactions/side effects.  ° °Do not drive when taking Pain medications or sleeping medications (Benzodaizepines) ° °Do not take more than prescribed Pain, Sleep and Anxiety Medications. It is not advisable to combine anxiety,sleep and pain medications without talking with your primary care practitioner ° °Special Instructions: If you have smoked or chewed Tobacco  in the last 2 yrs please stop smoking, stop any regular Alcohol  and or any Recreational drug use. ° °Wear Seat belts while driving. ° °Please note: °You were cared for by a hospitalist during your hospital stay. Once you are discharged,  your primary care physician will handle any further medical issues. Please note that NO REFILLS for any discharge medications will be authorized once you are discharged, as it is imperative that you return to your primary care physician (or establish a relationship with a primary care physician if you do not have one) for your post hospital discharge needs so that they can reassess your need for medications and monitor your lab values. ° ° ° ° °

## 2017-06-08 NOTE — Progress Notes (Signed)
Events of last night were noted.  I discussed this with him and he says that he typically sleeps in a chair and that he is aware that he snores but no one has ever told him that he stops breathing during sleep.  I think he will need an outpatient sleep study.

## 2017-06-08 NOTE — Progress Notes (Signed)
Patient discharged home with personal belongings, discharge papers, and prescriptions. IV removed and site intact.

## 2017-06-08 NOTE — Progress Notes (Signed)
Per continuous pulse ox, SpO2 75% on RA. This RN went to check on patient. Patient was sleeping and snoring, sitting upright in chair. SpO2 had slowly increased to 92% on RA, but then slowly decreased to 83% on RA. O2 1L [place] applied to patient. SpO2 96% on O2 1L [place] while patient awake. Will continue to monitor.

## 2017-06-08 NOTE — Discharge Summary (Signed)
Physician Discharge Summary  Vincent Ramos HAL:[id number] DOB: [birth year] DOA: 05/30/2017  PCP: [name], MD GI: [facility]  Admit date: 05/30/2017 Discharge date: 06/08/2017  Admitted From: Home  Disposition: Home   Recommendations for Outpatient Follow-up:  1. Follow up with PCP in 1 weeks 2. Follow up for outpatient sleep study at sleep center as arranged.  3. Follow up with GI in 6 weeks 4. Follow up with pulmonology in 3 weeks 5. Please obtain BMP/CBC in 1-2 weeks 6. Please follow up on the following pending results: final culture data  Discharge Condition: STABLE   CODE STATUS: FULL    Brief Hospitalization Summary: Please see all hospital notes, images, labs for full details of the hospitalization.  HPI: Vincent Ramos is a 66 y.o. male with medical history significant for diabetes mellitus, hypertension, who presented to the ED with complaints of shortness of breath cough that started 4 days ago. Cough is productive of greenish sputum. Patient also reports chest pain on both sides of his chest and abdominal pain from coughing. Patient endorses sweating chills and fever.  Patient endorses bilateral lower extremity swelling or one-month duration, without pain or redness.  ED Course: Temperature 100.6, pulse 118, elevated blood pressure 139/109, O2 sats 86% on room air improved to 96% on 2L nasal cannula. WBC elevated at 12.3. Potassium mildly low at 3.3. Lactic acid normal at 1.1. Chest x-ray showed a left base infiltrate. Patient was started on IV Levaquin for community-acquired pneumonia. On arrival to the ED patient was also wheezing he was giving 125 mg of Solu-Medrol, magnesium sulfate, and albuterol treatments with improvement in symptoms.   Brief Narrative:  66 year old male with a history of hypertension and diabetes, presented to the emergency room with complaints of shortness of breath chest pain.  Found to have evidence of pneumonia and started on  intravenous antibiotics.  On 11/1, respiratory status decompensated and he required transfer to stepdown for bipap therapy. Pulmonology consulted.  It was felt the patient likely had aspiration pneumonia.  With antibiotics and pulmonary hygiene, his overall respiratory status has improved.  He is now on room air and breathing comfortably.  Continues to have significant problems with eating and drinking.  Describes dysphagia.  GI  has been consulted to see if endoscopy is necessary.  Anticipate discharge home in the next 24-48 hours.   Assessment & Plan:   Active Problems:   Diabetes ([place])   Hypertension   Obesity   Acute respiratory failure with hypoxia (HCC)   Dysphagia   Aspiration pneumonia ([place])   1. Aspiration pneumonia.  Chest x-ray showed left lower lobe infiltrate. He was initially started on Rocephin and azithromycin.  Shortly after admission, his respiratory status decompensated and repeat chest xray showed worsening pneumonia on left side. Patient was also noted to be having a fever.  It is likely that he had an aspiration event that led to decompensation.  Antibiotics were changed from ceftriaxone to unasyn for better anaerobic coverage. Clinically, he appears to be improving. CT chest showed pneumonia. Continue on bronchodilators.  Appreciate Dr. Luan Pulling assistance.  He was briefly placed on steroids, but since wheezing has improved, steroids have now been discontinued. Seen by speech therapy and concern for esophageal dysmotility.  He underwent esophagram that showed mild vallecular and pyriform sinus residuals which were spontaneously cleared.  Since the patient was continuing to have difficulty with eating solids and liquids and feels that this gets caught in his chest, GI has been consulted  to see if endoscopy is necessary.  EGD performed and essentially normal.  It is highly suspected that he has sleep apnea.  An outpatient sleep study has been arranged for him.  2. Chronic  Dyspnea - he has been started on nebs and has felt much better.   He has been arranged to have an outpatient sleep study.    3. Chest pain.  Likely pleuritic from pneumonia.  He has ruled out for ACS with negative cardiac markers. 4. Diabetes.  Hyperglycemia likely related to steroids. Lantus and NovoLog adjusted.  Continue sliding scale insulin.  Anticipate blood sugar should improve as steroids are tapered. 5. Hypertension.  Blood pressures have been stable.  Continue Norvasc, valsartan and hydrochlorothiazide. 6. Acute respiratory failure with hypoxia.  Related to pneumonia.  He had intermittently required BiPAP, but now on nasal cannula. Weaning off oxygen as tolerated.  DVT prophylaxis: Lovenox Code Status: Full code Family Communication: No family present Disposition Plan: Discharge home  Consultants:   Pulmonology  Speech therapy  gastroenterology  Antimicrobials:   Rocephin 10/30 > 11/1  Azithromycin 10/30 >11/5  Unasyn 11/1>11/8  Discharge Diagnoses:  Active Problems:   Diabetes ([place])   Hypertension   Obesity   Acute respiratory failure with hypoxia (HCC)   Dysphagia   Aspiration pneumonia [facility])    Discharge Instructions: Discharge Instructions    Call MD for:  difficulty breathing, headache or visual disturbances   Complete by:  As directed    Call MD for:  extreme fatigue   Complete by:  As directed    Call MD for:  hives   Complete by:  As directed    Call MD for:  persistant dizziness or light-headedness   Complete by:  As directed    Call MD for:  persistant nausea and vomiting   Complete by:  As directed    Call MD for:  severe uncontrolled pain   Complete by:  As directed    Increase activity slowly   Complete by:  As directed    Polysomnography 4 or more parameters   Complete by:  As directed    Where should this test be performed:  [facility]     Allergies as of 06/08/2017      Reactions   Victoza [liraglutide]  Itching, Rash      Medication List    TAKE these medications   amLODipine 10 MG tablet Commonly known as:  NORVASC Take 10 mg by mouth daily.   amoxicillin-clavulanate 875-125 MG tablet Commonly known as:  AUGMENTIN Take 1 tablet every 12 (twelve) hours for 5 days by mouth.   aspirin EC 81 MG tablet Take 81 mg by mouth daily.   FLU HBP 15-500-2 MG Tabs Generic drug:  DM-APAP-CPM Take 1-2 tablets by mouth daily as needed (for flu symptoms).   furosemide 20 MG tablet Commonly known as:  LASIX Take 20 mg by mouth daily.   glipiZIDE 10 MG tablet Commonly known as:  GLUCOTROL Take 10 mg by mouth 2 (two) times daily before a meal.   ipratropium-albuterol 0.5-2.5 (3) MG/3ML Soln Commonly known as:  DUONEB Take 3 mLs every 6 (six) hours by nebulization.   metFORMIN 1000 MG tablet Commonly known as:  GLUCOPHAGE Take 1,000 mg by mouth 2 (two) times daily.   pantoprazole 40 MG tablet Commonly known as:  PROTONIX Take 1 tablet (40 mg total) daily by mouth. Start taking on:  06/09/2017   sucralfate 1 g tablet Commonly known  as:  CARAFATE Take 1 tablet (1 g total) 4 (four) times daily -  with meals and at bedtime for 2 days by mouth.   tamsulosin 0.4 MG Caps capsule Commonly known as:  FLOMAX Take 1 capsule by mouth every morning.   TRESIBA FLEXTOUCH 200 UNIT/ML Sopn Generic drug:  Insulin Degludec Inject 50 Units into the skin at bedtime.   TRULICITY 1.5 XF/8.1WE Sopn Generic drug:  Dulaglutide Inject 1.5 mg into the skin once a week. Takes on Mondays/Tuesdays   valsartan-hydrochlorothiazide 320-12.5 MG tablet Commonly known as:  DIOVAN-HCT Take 1 tablet by mouth daily.            Durable Medical Equipment  (From admission, onward)        Start     Ordered   06/08/17 1051  For home use only DME Nebulizer machine  Once    Question:  Patient needs a nebulizer to treat with the following condition  Answer:  COPD (chronic obstructive pulmonary disease) ([place])    06/08/17 1050     Follow-up Information    [facility] Follow up.   Contact information: [address] [place] [phone]       [name], MD. Schedule an appointment as soon as possible for a visit in 1 week(s).   Specialty:  Internal Medicine Contact information: [place] [address] [phone]        Daneil Dolin, MD. Schedule an appointment as soon as possible for a visit in 6 week(s).   Specialty:  Gastroenterology Why:  Hospital Follow Up Contact information: [address] [place] [address] [phone]        Sinda Du, MD. Schedule an appointment as soon as possible for a visit in 3 week(s).   Specialty:  Pulmonary Disease Why:  Hospital follow Up  Contact information: [address] [place] [address] [phone]          Allergies  Allergen Reactions  . Victoza [Liraglutide] Itching and Rash   Current Discharge Medication List    START taking these medications   Details  amoxicillin-clavulanate (AUGMENTIN) 875-125 MG tablet Take 1 tablet every 12 (twelve) hours for 5 days by mouth. Qty: 10 tablet, Refills: 0    ipratropium-albuterol (DUONEB) 0.5-2.5 (3) MG/3ML SOLN Take 3 mLs every 6 (six) hours by nebulization. Qty: 360 mL, Refills: 0    pantoprazole (PROTONIX) 40 MG tablet Take 1 tablet (40 mg total) daily by mouth. Qty: 30 tablet, Refills: 0    sucralfate (CARAFATE) 1 g tablet Take 1 tablet (1 g total) 4 (four) times daily -  with meals and at bedtime for 2 days by mouth. Qty: 8 tablet, Refills: 0      CONTINUE these medications which have NOT CHANGED   Details  amLODipine (NORVASC) 10 MG tablet Take 10 mg by mouth daily.    aspirin EC 81 MG tablet Take 81 mg by mouth daily.    DM-APAP-CPM (FLU HBP) 15-500-2 MG TABS Take 1-2 tablets by mouth daily as needed (for flu symptoms).    furosemide (LASIX) 20 MG tablet Take 20  mg by mouth daily.    glipiZIDE (GLUCOTROL) 10 MG tablet Take 10 mg by mouth 2 (two) times daily before a meal.    Insulin Degludec (TRESIBA FLEXTOUCH) 200 UNIT/ML SOPN Inject 50 Units into the skin at bedtime.     metFORMIN (GLUCOPHAGE) 1000 MG tablet Take 1,000 mg  by mouth 2 (two) times daily.    tamsulosin (FLOMAX) 0.4 MG CAPS capsule Take 1 capsule by mouth every morning.     TRULICITY 1.5 QP/6.1PJ SOPN Inject 1.5 mg into the skin once a week. Takes on Mondays/Tuesdays    valsartan-hydrochlorothiazide (DIOVAN-HCT) 320-12.5 MG tablet Take 1 tablet by mouth daily.        Procedures/Studies: Dg Chest 2 View  Result Date: 05/30/2017 CLINICAL DATA:  Shortness of breath.  Productive cough. EXAM: CHEST  2 VIEW COMPARISON:  CT 01/27/2017.  Chest x-ray 01/27/2017. FINDINGS: Cardiomegaly with pulmonary venous congestion. Low lung volumes. Left base infiltrate. Small bilateral pleural effusions cannot be excluded. IMPRESSION: 1. Low lung volumes with basilar atelectasis. Left base infiltrate. Pneumonia cannot be excluded. 2. Cardiomegaly with mild pulmonary venous congestion small bilateral pleural effusions. Electronically Signed   By: Marcello Moores  Register   On: 05/30/2017 16:37   Ct Chest W Contrast  Result Date: 06/02/2017 CLINICAL DATA:  Pneumonia, chest pain EXAM: CT CHEST WITH CONTRAST TECHNIQUE: Multidetector CT imaging of the chest was performed during intravenous contrast administration. CONTRAST:  75 cc Isovue 370 IV COMPARISON:  01/27/2017 FINDINGS: Cardiovascular: Heart is borderline in size. Aorta is normal caliber. Mediastinum/Nodes: Mildly enlarged mediastinal lymph nodes. These have increased in size since prior study. Right paratracheal lymph node has a short axis diameter of 11 mm. Inferior right paratracheal lymph node has a short axis diameter of 13 mm. Mild bilateral hilar adenopathy. No axillary adenopathy. Lungs/Pleura: Extensive ground-glass opacities noted in the upper lobes  bilaterally, left greater than right as well as superior segment of the left lower lobe. Findings most likely reflect multifocal pneumonia. No effusions. Upper Abdomen: Imaging into the upper abdomen shows no acute findings. Musculoskeletal: Chest wall soft tissues are unremarkable. No acute bony abnormality. Degenerative spurring throughout the thoracic spine. IMPRESSION: Extensive ground-glass opacities in the upper lobes, left greater than right as well as the superior segment of the left lower lobe concerning for multifocal pneumonia. Given the upper lobe predominance, pulmonary edema is felt less likely but also possible. Bilateral hilar and axillary mild adenopathy, likely reactive. Electronically Signed   By: Rolm Baptise M.D.   On: 06/02/2017 11:59   Dg Esophagus  Result Date: 06/05/2017 CLINICAL DATA:  Cervical esophageal dysphagia, recurrent pneumonia EXAM: ESOPHOGRAM / BARIUM SWALLOW / BARIUM TABLET STUDY TECHNIQUE: Combined double contrast and single contrast examination performed using effervescent crystals, thick barium liquid, and thin barium liquid. The patient was observed with fluoroscopy swallowing a 13 mm barium sulphate tablet. FLUOROSCOPY TIME:  Fluoroscopy Time:  1 minutes 42 seconds Radiation Exposure Index (if provided by the fluoroscopic device): 51.6 mGy Number of Acquired Spot Images: multiple fluoroscopic screen captures COMPARISON:  None FINDINGS: Esophageal distention: Normal distention without mass or stricture Filling defects:  None 12.5 mm barium tablet: Passed from oral cavity to stomach without delay Motility: Mild diffuse age-related esophageal dysmotility, age-appropriate Mucosa:  Smooth without irregularity or ulceration Hypopharynx/cervical esophagus: No laryngeal penetration or aspiration. Mild vallecular and piriform sinus residuals which were spontaneously cleared Hiatal hernia:  Absent GE reflux:  Not visualized during exam Other:  N/A IMPRESSION: Mild vallecular and  piriform sinus residuals which were spontaneously cleared. Otherwise negative exam. Electronically Signed   By: Lavonia Dana M.D.   On: 06/05/2017 12:08   Dg Chest Port 1 View  Result Date: 06/01/2017 CLINICAL DATA:  Progressive shortness of breath. Recent diagnosis of pneumonia. EXAM: PORTABLE CHEST 1 VIEW COMPARISON:  Radiograph 05/30/2017 FINDINGS: Multifocal patchy  opacities throughout the left lung have increased from prior exam. Progressive peribronchial thickening. Cardiomegaly is similar. Left pleural effusion versus subpleural fat. No pneumothorax. IMPRESSION: Progressive peribronchial thickening suspicious for pulmonary edema. Progressive patchy opacities throughout the left lung, considerations include asymmetric more confluent pulmonary edema, pneumonia, aspiration or ARDS. Electronically Signed   By: Jeb Levering M.D.   On: 06/01/2017 03:28     EGD Impression:       - Normal esophagus. Dilated.                           - Normal stomach.                           - Normal duodenal bulb and second portion of the                            duodenum.                           - No specimens collected. Moderate Sedation:      Moderate (conscious) sedation was personally administered by an       anesthesia professional. The following parameters were monitored: oxygen       saturation, heart rate, blood pressure, respiratory rate, EKG, adequacy       of pulmonary ventilation, and response to care. Total physician       intraservice time was 12 minutes. Recommendation:           - Patient has a contact number available for                            emergencies. The signs and symptoms of potential                            delayed complications were discussed with the                            patient. Return to normal activities tomorrow.                            Written discharge instructions were provided to the                            patient.                           -  Return patient to hospital ward for ongoing care.                            Continue PPI daily. Carafate QID x 3 days                           - Diabetic (ADA) diet.                           - Continue present medications.                           -  No repeat upper endoscopy.                           - Return to GI office in 6 weeks.   Subjective: Pt says he feels a lot better, he is eating and drinking well.  No problems or complaints. He wants to go home.    Discharge Exam: Vitals:   06/08/17 0606 06/08/17 0817  BP: (!) 170/84   Pulse: 73   Resp: 18   Temp: 98.7 F (37.1 C)   SpO2: 94% 95%   Vitals:   06/08/17 0140 06/08/17 0425 06/08/17 0606 06/08/17 0817  BP:   (!) 170/84   Pulse:   73   Resp:   18   Temp:   98.7 F (37.1 C)   TempSrc:   Oral   SpO2: 96% 99% 94% 95%  Weight:      Height:       General: Pt is alert, awake, not in acute distress Cardiovascular: normal S1/S2 +, no rubs, no gallops Respiratory: CTA bilaterally, no wheezing, no rhonchi Abdominal: Soft, NT, ND, bowel sounds + Extremities: no edema, no cyanosis   The results of significant diagnostics from this hospitalization (including imaging, microbiology, ancillary and laboratory) are listed below for reference.     Microbiology: Recent Results (from the past 240 hour(s))  Blood culture (routine x 2)     Status: None   Collection Time: 05/30/17  6:23 PM  Result Value Ref Range Status   Specimen Description BLOOD RIGHT FOREARM  Final   Special Requests   Final    BOTTLES DRAWN AEROBIC AND ANAEROBIC Blood Culture adequate volume   Culture NO GROWTH 5 DAYS  Final   Report Status 06/04/2017 FINAL  Final  Blood culture (routine x 2)     Status: None   Collection Time: 05/30/17  6:23 PM  Result Value Ref Range Status   Specimen Description BLOOD RIGHT HAND  Final   Special Requests   Final    BOTTLES DRAWN AEROBIC AND ANAEROBIC Blood Culture adequate volume   Culture NO GROWTH 5 DAYS  Final    Report Status 06/04/2017 FINAL  Final  MRSA PCR Screening     Status: None   Collection Time: 06/01/17  3:31 AM  Result Value Ref Range Status   MRSA by PCR NEGATIVE NEGATIVE Final    Comment:        The GeneXpert MRSA Assay (FDA approved for NASAL specimens only), is one component of a comprehensive MRSA colonization surveillance program. It is not intended to diagnose MRSA infection nor to guide or monitor treatment for MRSA infections.      Labs: BNP (last 3 results) Recent Labs    07/01/16 1425 06/01/17 0402  BNP 31.0 211.9*   Basic Metabolic Panel: Recent Labs  Lab 06/02/17 0405 06/04/17 0613 06/06/17 0555  NA 142 139 138  K 4.0 4.0 3.7  CL 101 100* 100*  CO2 30 31 31   GLUCOSE 284* 293* 230*  BUN 18 24* 17  CREATININE 1.10 1.02 0.95  CALCIUM 8.7* 8.4* 7.8*  MG  --  2.5*  --    Liver Function Tests: No results for input(s): AST, ALT, ALKPHOS, BILITOT, PROT, ALBUMIN in the last 168 hours. No results for input(s): LIPASE, AMYLASE in the last 168 hours. No results for input(s): AMMONIA in the last 168 hours. CBC: Recent Labs  Lab 06/02/17 0405 06/06/17 0555  WBC 13.1* 13.7*  HGB 13.6 12.5*  HCT 42.8 39.9  MCV 82.6 82.1  PLT 169 199   Cardiac Enzymes: No results for input(s): CKTOTAL, CKMB, CKMBINDEX, TROPONINI in the last 168 hours. BNP: Invalid input(s): POCBNP CBG: Recent Labs  Lab 06/07/17 1448 06/07/17 1507 06/07/17 1702 06/07/17 2114 06/08/17 0755  GLUCAP 107* 85 191* 362* 137*   D-Dimer No results for input(s): DDIMER in the last 72 hours. Hgb A1c No results for input(s): HGBA1C in the last 72 hours. Lipid Profile No results for input(s): CHOL, HDL, LDLCALC, TRIG, CHOLHDL, LDLDIRECT in the last 72 hours. Thyroid function studies No results for input(s): TSH, T4TOTAL, T3FREE, THYROIDAB in the last 72 hours.  Invalid input(s): FREET3 Anemia work up No results for input(s): VITAMINB12, FOLATE, FERRITIN, TIBC, IRON, RETICCTPCT in  the last 72 hours. Urinalysis No results found for: COLORURINE, APPEARANCEUR, [place], [place], [place], [place], [place], [place], [place], UROBILINOGEN, NITRITE, LEUKOCYTESUR Sepsis Labs Invalid input(s): PROCALCITONIN,  WBC,  LACTICIDVEN Microbiology Recent Results (from the past 240 hour(s))  Blood culture (routine x 2)     Status: None   Collection Time: 05/30/17  6:23 PM  Result Value Ref Range Status   Specimen Description BLOOD RIGHT FOREARM  Final   Special Requests   Final    BOTTLES DRAWN AEROBIC AND ANAEROBIC Blood Culture adequate volume   Culture NO GROWTH 5 DAYS  Final   Report Status 06/04/2017 FINAL  Final  Blood culture (routine x 2)     Status: None   Collection Time: 05/30/17  6:23 PM  Result Value Ref Range Status   Specimen Description BLOOD RIGHT HAND  Final   Special Requests   Final    BOTTLES DRAWN AEROBIC AND ANAEROBIC Blood Culture adequate volume   Culture NO GROWTH 5 DAYS  Final   Report Status 06/04/2017 FINAL  Final  MRSA PCR Screening     Status: None   Collection Time: 06/01/17  3:31 AM  Result Value Ref Range Status   MRSA by PCR NEGATIVE NEGATIVE Final    Comment:        The GeneXpert MRSA Assay (FDA approved for NASAL specimens only), is one component of a comprehensive MRSA colonization surveillance program. It is not intended to diagnose MRSA infection nor to guide or monitor treatment for MRSA infections.    Time coordinating discharge: 40 mins  SIGNED:  Irwin Brakeman, MD  [facility] 06/08/2017, 10:54 AM Pager [phone]  If 7PM-7AM, please contact night-coverage [email or web address] Password [email or web address]

## 2017-06-09 ENCOUNTER — Encounter (HOSPITAL_COMMUNITY): Payer: Self-pay | Admitting: Internal Medicine

## 2017-06-21 DIAGNOSIS — R600 Localized edema: Secondary | ICD-10-CM | POA: Diagnosis not present

## 2017-06-21 DIAGNOSIS — E1165 Type 2 diabetes mellitus with hyperglycemia: Secondary | ICD-10-CM | POA: Diagnosis not present

## 2017-06-21 DIAGNOSIS — J189 Pneumonia, unspecified organism: Secondary | ICD-10-CM | POA: Diagnosis not present

## 2017-06-21 DIAGNOSIS — I10 Essential (primary) hypertension: Secondary | ICD-10-CM | POA: Diagnosis not present

## 2017-06-21 DIAGNOSIS — Z712 Person consulting for explanation of examination or test findings: Secondary | ICD-10-CM | POA: Diagnosis not present

## 2017-06-21 DIAGNOSIS — N401 Enlarged prostate with lower urinary tract symptoms: Secondary | ICD-10-CM | POA: Diagnosis not present

## 2017-06-26 DIAGNOSIS — I10 Essential (primary) hypertension: Secondary | ICD-10-CM | POA: Diagnosis not present

## 2017-06-26 DIAGNOSIS — J189 Pneumonia, unspecified organism: Secondary | ICD-10-CM | POA: Diagnosis not present

## 2017-06-26 DIAGNOSIS — G4733 Obstructive sleep apnea (adult) (pediatric): Secondary | ICD-10-CM | POA: Diagnosis not present

## 2017-06-26 DIAGNOSIS — E669 Obesity, unspecified: Secondary | ICD-10-CM | POA: Diagnosis not present

## 2017-07-05 ENCOUNTER — Ambulatory Visit: Payer: PPO | Admitting: Gastroenterology

## 2017-07-05 ENCOUNTER — Encounter: Payer: Self-pay | Admitting: *Deleted

## 2017-07-05 ENCOUNTER — Encounter: Payer: Self-pay | Admitting: Gastroenterology

## 2017-07-05 ENCOUNTER — Other Ambulatory Visit: Payer: Self-pay | Admitting: *Deleted

## 2017-07-05 ENCOUNTER — Telehealth: Payer: Self-pay | Admitting: *Deleted

## 2017-07-05 VITALS — BP 184/90 | HR 78 | Temp 98.1°F | Ht 66.5 in | Wt 279.0 lb

## 2017-07-05 DIAGNOSIS — Z1211 Encounter for screening for malignant neoplasm of colon: Secondary | ICD-10-CM | POA: Diagnosis not present

## 2017-07-05 DIAGNOSIS — R1310 Dysphagia, unspecified: Secondary | ICD-10-CM

## 2017-07-05 DIAGNOSIS — R1319 Other dysphagia: Secondary | ICD-10-CM

## 2017-07-05 MED ORDER — PEG 3350-KCL-NA BICARB-NACL 420 G PO SOLR: 4000.0000 mL | Freq: Once | ORAL | 0 refills | Status: AC

## 2017-07-05 NOTE — Telephone Encounter (Signed)
Spoke with pt and is aware preop is scheduled for 08/03/17 at 1:15pm. Letter mailed to patient.

## 2017-07-05 NOTE — Progress Notes (Addendum)
REVIEWED-NO ADDITIONAL RECOMMENDATIONS.  Primary Care Physician: [name], MD  Primary Gastroenterologist:  Barney Drain, MD   Chief Complaint  Patient presents with  . Dysphagia    throat tightens up; hospital f/u  . Colonoscopy    HPI: Vincent Ramos is a 66 y.o. male here for hospital follow-up.  Also in need of colonoscopy.  He was seen during admission on May 30, 2017.  Admitted for about a week for pneumonia.  During admission there was concern for aspiration event.  Seen by speech therapy on November 3 and reported occasional globus sensation with drinking water.  Due to concern for dysmotility BPE was ordered.  This showed mild diffuse age-related esophageal dysmotility, age-appropriate.  EGD performed during admission was entirely normal.  Esophagus dilated empirically up to 60 [place].  Dysphagia a lot better. Some phlegm.  Overall feeling better.  No swallowing concerns.  Bowel movements 2-3 times per day.  Lately [place] stool 1 initially but then loose stools afterwards.  No rectal bleeding or melena.  No abdominal pain.  Notably he has both Nexium and pantoprazole on his medication list. Is not taking Nexium apparently as it has not been filled since 04/2017 for 30 day supply. Advised not to take Nexium.    Current Outpatient Medications  Medication Sig Dispense Refill  . amLODipine (NORVASC) 10 MG tablet Take 10 mg by mouth daily.    Marland Kitchen aspirin EC 81 MG tablet Take 81 mg by mouth daily.    . cloNIDine (CATAPRES) 0.1 MG tablet Take 1 tablet by mouth 2 (two) times daily.    Marland Kitchen esomeprazole (NEXIUM) 20 MG capsule Take 20 mg by mouth daily.     . furosemide (LASIX) 20 MG tablet Take 20 mg by mouth daily.    Marland Kitchen glipiZIDE (GLUCOTROL) 10 MG tablet Take 10 mg by mouth 2 (two) times daily before a meal.    . Insulin Degludec (TRESIBA FLEXTOUCH) 200 UNIT/ML SOPN Inject 50 Units into the skin at bedtime.     . metFORMIN (GLUCOPHAGE) 1000 MG tablet Take 1,000 mg by mouth 2  (two) times daily.    . pantoprazole (PROTONIX) 40 MG tablet Take 1 tablet (40 mg total) daily by mouth. 30 tablet 0  . tamsulosin (FLOMAX) 0.4 MG CAPS capsule Take 1 capsule by mouth every morning.     . TRULICITY 1.5 HU/7.6LY SOPN Inject 1.5 mg into the skin once a week. Takes on Mondays/Tuesdays    . valsartan-hydrochlorothiazide (DIOVAN-HCT) 320-12.5 MG tablet Take 1 tablet by mouth daily.    . sucralfate (CARAFATE) 1 g tablet Take 1 tablet (1 g total) 4 (four) times daily -  with meals and at bedtime for 2 days by mouth. 8 tablet 0   No current facility-administered medications for this visit.     Allergies as of 07/05/2017 - Review Complete 07/05/2017  Allergen Reaction Noted  . Victoza [liraglutide] Itching and Rash 05/30/2017   Past Medical History:  Diagnosis Date  . Arthritis   . CAP (community acquired pneumonia) 07/31/2014  . Diabetes mellitus   . Enlarged prostate   . Hypertension   . Pneumonia   . Pneumonia   . Shortness of breath    Past Surgical History:  Procedure Laterality Date  . CIRCUMCISION    . ESOPHAGEAL DILATION N/A 06/07/2017   Procedure: ESOPHAGEAL DILATION;  Surgeon: Daneil Dolin, MD;  [facility];  Service: Endoscopy;  Laterality: N/A;  . ESOPHAGOGASTRODUODENOSCOPY (EGD) WITH PROPOFOL  N/A 06/07/2017   Procedure: ESOPHAGOGASTRODUODENOSCOPY (EGD) WITH PROPOFOL;  Surgeon: Daneil Dolin, MD;  [facility]: [facility];  Service: Endoscopy;  Laterality: N/A;  . HYDROCELE EXCISION  10/14/2011   Procedure: HYDROCELECTOMY ADULT;  Surgeon: Malka So, MD;  Location: [facility];  Service: Urology;  Laterality: Right;  . LACERATION REPAIR     left hand  . LUMBAR LAMINECTOMY/DECOMPRESSION MICRODISCECTOMY Right 10/07/2015   Procedure: Right Lumbar four-five ,lumbar five sacral-one Laminectomy;  Surgeon: Leeroy Cha, MD;  Location: [place] [facility];  Service: Neurosurgery;  Laterality: Right;   Family History  Problem Relation Age of Onset  . Diabetes  Unknown   . Hypertension Unknown   . CAD Unknown   . Cancer Unknown   . Anesthesia problems Neg Hx   . Hypotension Neg Hx   . Malignant hyperthermia Neg Hx   . Pseudochol deficiency Neg Hx   . Colon cancer Neg Hx   . Colon polyps Neg Hx    Social History   Tobacco Use  . Smoking status: Never Smoker  . Smokeless tobacco: Never Used  Substance Use Topics  . Alcohol use: No    Alcohol/week: 0.0 oz    Frequency: Never    Comment: rare beer  . Drug use: No    ROS:  General: Negative for anorexia, weight loss, fever, chills, fatigue, weakness. ENT: Negative for hoarseness, difficulty swallowing , nasal congestion. CV: Negative for chest pain, angina, palpitations, dyspnea on exertion, peripheral edema.  Respiratory: Negative for dyspnea at rest, dyspnea on exertion, cough, sputum, wheezing.  GI: See history of present illness. GU:  Negative for dysuria, hematuria, urinary incontinence, urinary frequency, nocturnal urination.  Endo: Negative for unusual weight change.    Physical Examination:   BP (!) 188/87   Pulse 78   Temp 98.1 F (36.7 C) (Oral)   Ht 5' 6.5" (1.689 m)   Wt 279 lb (126.6 kg)   BMI 44.36 kg/m   General: Well-nourished, well-developed in no acute distress.  Eyes: No icterus. Mouth: Oropharyngeal mucosa moist and pink , no lesions erythema or exudate. Lungs: Clear to auscultation bilaterally.  Heart: Regular rate and rhythm, no murmurs rubs or gallops.  Abdomen: Bowel sounds are normal, nontender, nondistended, no hepatosplenomegaly or masses, no abdominal bruits or hernia , no rebound or guarding.   Extremities: No lower extremity edema. No clubbing or deformities. Neuro: Alert and oriented x 4   Skin: Warm and dry, no jaundice.   Psych: Alert and cooperative, normal mood and affect.  Labs:  Lab Results  Component Value Date   CREATININE 0.95 06/06/2017   BUN 17 06/06/2017   NA 138 06/06/2017   K 3.7 06/06/2017   CL 100 (L) 06/06/2017   CO2  31 06/06/2017   Lab Results  Component Value Date   ALT 33 05/30/2017   AST 31 05/30/2017   ALKPHOS 73 05/30/2017   BILITOT 0.9 05/30/2017   Lab Results  Component Value Date   WBC 13.7 (H) 06/06/2017   HGB 12.5 (L) 06/06/2017   HCT 39.9 06/06/2017   MCV 82.1 06/06/2017   PLT 199 06/06/2017    Imaging Studies: No results found.

## 2017-07-05 NOTE — Patient Instructions (Signed)
1. Colonoscopy as scheduled. See separate instructions.  

## 2017-07-06 ENCOUNTER — Telehealth: Payer: Self-pay | Admitting: *Deleted

## 2017-07-06 MED ORDER — PEG-KCL-NACL-NASULF-NA ASC-C 100 G PO SOLR: 1.0000 | Freq: Once | ORAL | 0 refills | Status: AC

## 2017-07-06 NOTE — Telephone Encounter (Signed)
Received a fax from [employer]. Patient has a low income subsidy and it will cover moviprep. Spoke with patient and is aware I am mailing him new instructions as his insurance did not cover what was sent in. He verbalized understanding. Instructions mailed to patient.

## 2017-07-08 DIAGNOSIS — M4322 Fusion of spine, cervical region: Secondary | ICD-10-CM | POA: Diagnosis not present

## 2017-07-08 DIAGNOSIS — J159 Unspecified bacterial pneumonia: Secondary | ICD-10-CM | POA: Diagnosis not present

## 2017-07-09 DIAGNOSIS — Z1211 Encounter for screening for malignant neoplasm of colon: Secondary | ICD-10-CM | POA: Insufficient documentation

## 2017-07-09 NOTE — Assessment & Plan Note (Signed)
Improved s/p esophageal dilation. Continue PPI. Continue to monitor symptoms.

## 2017-07-09 NOTE — Assessment & Plan Note (Signed)
Due for first ever colonoscopy. Plan for deep sedation with propofol/MAC.  I have discussed the risks, alternatives, benefits with regards to but not limited to the risk of reaction to medication, bleeding, infection, perforation and the patient is agreeable to proceed. Written consent to be obtained.

## 2017-07-12 NOTE — Progress Notes (Signed)
CC'D TO PCP °

## 2017-07-18 DIAGNOSIS — E1165 Type 2 diabetes mellitus with hyperglycemia: Secondary | ICD-10-CM | POA: Diagnosis not present

## 2017-07-18 DIAGNOSIS — R0902 Hypoxemia: Secondary | ICD-10-CM | POA: Diagnosis not present

## 2017-07-18 DIAGNOSIS — N401 Enlarged prostate with lower urinary tract symptoms: Secondary | ICD-10-CM | POA: Diagnosis not present

## 2017-07-18 DIAGNOSIS — R600 Localized edema: Secondary | ICD-10-CM | POA: Diagnosis not present

## 2017-07-18 DIAGNOSIS — E119 Type 2 diabetes mellitus without complications: Secondary | ICD-10-CM | POA: Diagnosis not present

## 2017-07-18 DIAGNOSIS — J189 Pneumonia, unspecified organism: Secondary | ICD-10-CM | POA: Diagnosis not present

## 2017-07-18 DIAGNOSIS — I10 Essential (primary) hypertension: Secondary | ICD-10-CM | POA: Diagnosis not present

## 2017-07-18 DIAGNOSIS — R0602 Shortness of breath: Secondary | ICD-10-CM | POA: Diagnosis not present

## 2017-07-18 DIAGNOSIS — Z712 Person consulting for explanation of examination or test findings: Secondary | ICD-10-CM | POA: Diagnosis not present

## 2017-07-20 DIAGNOSIS — E782 Mixed hyperlipidemia: Secondary | ICD-10-CM | POA: Diagnosis not present

## 2017-07-20 DIAGNOSIS — R600 Localized edema: Secondary | ICD-10-CM | POA: Diagnosis not present

## 2017-07-20 DIAGNOSIS — N529 Male erectile dysfunction, unspecified: Secondary | ICD-10-CM | POA: Diagnosis not present

## 2017-07-20 DIAGNOSIS — N400 Enlarged prostate without lower urinary tract symptoms: Secondary | ICD-10-CM | POA: Diagnosis not present

## 2017-07-20 DIAGNOSIS — R0602 Shortness of breath: Secondary | ICD-10-CM | POA: Diagnosis not present

## 2017-07-20 DIAGNOSIS — I10 Essential (primary) hypertension: Secondary | ICD-10-CM | POA: Diagnosis not present

## 2017-07-20 DIAGNOSIS — G8929 Other chronic pain: Secondary | ICD-10-CM | POA: Diagnosis not present

## 2017-07-20 DIAGNOSIS — E1129 Type 2 diabetes mellitus with other diabetic kidney complication: Secondary | ICD-10-CM | POA: Diagnosis not present

## 2017-07-20 DIAGNOSIS — Z713 Dietary counseling and surveillance: Secondary | ICD-10-CM | POA: Diagnosis not present

## 2017-07-27 ENCOUNTER — Encounter (HOSPITAL_COMMUNITY): Payer: Self-pay

## 2017-08-03 ENCOUNTER — Encounter (HOSPITAL_COMMUNITY): Payer: Self-pay

## 2017-08-03 ENCOUNTER — Encounter (HOSPITAL_COMMUNITY)
Admission: RE | Admit: 2017-08-03 | Discharge: 2017-08-03 | Disposition: A | Discharge status: Home / Self Care | Payer: PPO | Source: Ambulatory Visit | Attending: Gastroenterology | Admitting: Gastroenterology

## 2017-08-04 ENCOUNTER — Other Ambulatory Visit (HOSPITAL_BASED_OUTPATIENT_CLINIC_OR_DEPARTMENT_OTHER): Payer: Self-pay

## 2017-08-04 DIAGNOSIS — R0602 Shortness of breath: Secondary | ICD-10-CM

## 2017-08-04 DIAGNOSIS — G4730 Sleep apnea, unspecified: Secondary | ICD-10-CM

## 2017-08-08 ENCOUNTER — Ambulatory Visit (HOSPITAL_COMMUNITY): Payer: PPO | Admitting: Anesthesiology

## 2017-08-08 ENCOUNTER — Encounter (HOSPITAL_COMMUNITY)
Admission: RE | Disposition: A | Discharge status: Home / Self Care | Payer: Self-pay | Source: Ambulatory Visit | Attending: Gastroenterology

## 2017-08-08 ENCOUNTER — Encounter (HOSPITAL_COMMUNITY): Payer: Self-pay | Admitting: *Deleted

## 2017-08-08 ENCOUNTER — Ambulatory Visit (HOSPITAL_COMMUNITY)
Admission: RE | Admit: 2017-08-08 | Discharge: 2017-08-08 | Disposition: A | Discharge status: Home / Self Care | Payer: PPO | Source: Ambulatory Visit | Attending: Gastroenterology | Admitting: Gastroenterology

## 2017-08-08 ENCOUNTER — Other Ambulatory Visit: Payer: Self-pay

## 2017-08-08 DIAGNOSIS — M1990 Unspecified osteoarthritis, unspecified site: Secondary | ICD-10-CM | POA: Insufficient documentation

## 2017-08-08 DIAGNOSIS — K621 Rectal polyp: Secondary | ICD-10-CM | POA: Insufficient documentation

## 2017-08-08 DIAGNOSIS — Z1211 Encounter for screening for malignant neoplasm of colon: Secondary | ICD-10-CM | POA: Insufficient documentation

## 2017-08-08 DIAGNOSIS — E119 Type 2 diabetes mellitus without complications: Secondary | ICD-10-CM | POA: Insufficient documentation

## 2017-08-08 DIAGNOSIS — Q438 Other specified congenital malformations of intestine: Secondary | ICD-10-CM | POA: Insufficient documentation

## 2017-08-08 DIAGNOSIS — Z1212 Encounter for screening for malignant neoplasm of rectum: Secondary | ICD-10-CM

## 2017-08-08 DIAGNOSIS — I10 Essential (primary) hypertension: Secondary | ICD-10-CM | POA: Insufficient documentation

## 2017-08-08 DIAGNOSIS — Z888 Allergy status to other drugs, medicaments and biological substances status: Secondary | ICD-10-CM | POA: Insufficient documentation

## 2017-08-08 DIAGNOSIS — Z7982 Long term (current) use of aspirin: Secondary | ICD-10-CM | POA: Diagnosis not present

## 2017-08-08 DIAGNOSIS — Z7984 Long term (current) use of oral hypoglycemic drugs: Secondary | ICD-10-CM | POA: Diagnosis not present

## 2017-08-08 DIAGNOSIS — Z79899 Other long term (current) drug therapy: Secondary | ICD-10-CM | POA: Insufficient documentation

## 2017-08-08 DIAGNOSIS — K644 Residual hemorrhoidal skin tags: Secondary | ICD-10-CM | POA: Diagnosis not present

## 2017-08-08 DIAGNOSIS — K648 Other hemorrhoids: Secondary | ICD-10-CM | POA: Diagnosis not present

## 2017-08-08 DIAGNOSIS — M4322 Fusion of spine, cervical region: Secondary | ICD-10-CM | POA: Diagnosis not present

## 2017-08-08 DIAGNOSIS — J159 Unspecified bacterial pneumonia: Secondary | ICD-10-CM | POA: Diagnosis not present

## 2017-08-08 HISTORY — PX: COLONOSCOPY WITH PROPOFOL: SHX5780

## 2017-08-08 LAB — GLUCOSE, CAPILLARY
GLUCOSE-CAPILLARY: 102 mg/dL — AB (ref 65–99)
Glucose-Capillary: 82 mg/dL (ref 65–99)

## 2017-08-08 SURGERY — COLONOSCOPY WITH PROPOFOL
Anesthesia: Monitor Anesthesia Care

## 2017-08-08 MED ORDER — LACTATED RINGERS IV SOLN
INTRAVENOUS | Status: DC | PRN
  Administered 2017-08-08: 12:00:00 via INTRAVENOUS

## 2017-08-08 MED ORDER — LABETALOL HCL 5 MG/ML IV SOLN
INTRAVENOUS | Status: AC
  Filled 2017-08-08: qty 4

## 2017-08-08 MED ORDER — FENTANYL CITRATE (PF) 100 MCG/2ML IJ SOLN
25.0000 ug | Freq: Once | INTRAMUSCULAR | Status: AC
  Administered 2017-08-08: 25 ug via INTRAVENOUS

## 2017-08-08 MED ORDER — LABETALOL HCL 5 MG/ML IV SOLN
INTRAVENOUS | Status: DC | PRN
  Administered 2017-08-08: 10 mg via INTRAVENOUS

## 2017-08-08 MED ORDER — CHLORHEXIDINE GLUCONATE CLOTH 2 % EX PADS: 6.0000 | MEDICATED_PAD | Freq: Once | CUTANEOUS | Status: DC

## 2017-08-08 MED ORDER — IPRATROPIUM-ALBUTEROL 0.5-2.5 (3) MG/3ML IN SOLN
RESPIRATORY_TRACT | Status: AC
  Filled 2017-08-08: qty 3

## 2017-08-08 MED ORDER — PROPOFOL 500 MG/50ML IV EMUL
INTRAVENOUS | Status: DC | PRN
  Administered 2017-08-08: 150 ug/kg/min via INTRAVENOUS
  Administered 2017-08-08: 12:00:00 via INTRAVENOUS

## 2017-08-08 MED ORDER — PROPOFOL 10 MG/ML IV BOLUS
INTRAVENOUS | Status: DC | PRN
  Administered 2017-08-08: 20 mg via INTRAVENOUS

## 2017-08-08 MED ORDER — PROPOFOL 10 MG/ML IV BOLUS
INTRAVENOUS | Status: AC
  Filled 2017-08-08: qty 40

## 2017-08-08 MED ORDER — LABETALOL HCL 5 MG/ML IV SOLN
10.0000 mg | INTRAVENOUS | Status: DC | PRN
  Administered 2017-08-08: 10 mg via INTRAVENOUS

## 2017-08-08 MED ORDER — FENTANYL CITRATE (PF) 100 MCG/2ML IJ SOLN
INTRAMUSCULAR | Status: AC
  Filled 2017-08-08: qty 2

## 2017-08-08 MED ORDER — PROPOFOL 10 MG/ML IV BOLUS
INTRAVENOUS | Status: AC
  Filled 2017-08-08: qty 20

## 2017-08-08 MED ORDER — MIDAZOLAM HCL 2 MG/2ML IJ SOLN
INTRAMUSCULAR | Status: AC
  Filled 2017-08-08: qty 2

## 2017-08-08 MED ORDER — MIDAZOLAM HCL 2 MG/2ML IJ SOLN
1.0000 mg | INTRAMUSCULAR | Status: AC
  Administered 2017-08-08: 2 mg via INTRAVENOUS

## 2017-08-08 MED ORDER — DEXTROSE 50 % IV SOLN
INTRAVENOUS | Status: AC
  Filled 2017-08-08: qty 50

## 2017-08-08 MED ORDER — IPRATROPIUM-ALBUTEROL 0.5-2.5 (3) MG/3ML IN SOLN
3.0000 mL | Freq: Once | RESPIRATORY_TRACT | Status: AC
  Administered 2017-08-08: 3 mL via RESPIRATORY_TRACT

## 2017-08-08 MED ORDER — LACTATED RINGERS IV SOLN
INTRAVENOUS | Status: DC
  Administered 2017-08-08: 12:00:00 via INTRAVENOUS

## 2017-08-08 MED ORDER — DEXTROSE 50 % IV SOLN
12.5000 g | Freq: Once | INTRAVENOUS | Status: AC
  Administered 2017-08-08: 12.5 g via INTRAVENOUS

## 2017-08-08 NOTE — Anesthesia Postprocedure Evaluation (Signed)
Anesthesia Post Note  Patient: Vincent Ramos  Procedure(s) Performed: COLONOSCOPY WITH PROPOFOL (N/A )  Patient location during evaluation: Short Stay Anesthesia Type: MAC Level of consciousness: awake and alert Pain management: satisfactory to patient Vital Signs Assessment: post-procedure vital signs reviewed and stable Respiratory status: spontaneous breathing Cardiovascular status: stable Postop Assessment: no apparent nausea or vomiting Anesthetic complications: no     Last Vitals:  Vitals:   08/08/17 1209 08/08/17 1300  BP:  (!) 149/88  Pulse:  85  Resp: 19 18  Temp:  37.1 C  SpO2: 96% 100%    Last Pain:  Vitals:   08/08/17 1120  TempSrc: Oral                 Meshach Perry

## 2017-08-08 NOTE — Addendum Note (Signed)
Addendum  created 08/08/17 1351 by Vista Deck, CRNA   Charge Capture section accepted

## 2017-08-08 NOTE — Op Note (Signed)
[facility] Patient Name: Vincent Ramos Procedure Date: 08/08/2017 11:50 AM MRN: [id number] Date of Birth: 1951/03/27 Attending MD: Barney Drain MD, MD CSN: [id number] Age: 67 Admit Type: Outpatient Procedure:                Colonoscopy WITH SNARE POLYPECTOMY Indications:              Screening for colorectal malignant neoplasm-WEIGHT                            279 LBS, BMI 44. Providers:                Barney Drain MD, MD, Janeece Riggers, RN, Aram Candela Referring MD:             Edwinna Areola. Hall MD Medicines:                Propofol per Anesthesia Complications:            No immediate complications. PT LIKELY HAS SLEEP                            APNEA. Estimated Blood Loss:     Estimated blood loss was minimal. Procedure:                Pre-Anesthesia Assessment:                           - Prior to the procedure, a History and Physical                            was performed, and patient medications and                            allergies were reviewed. The patient's tolerance of                            previous anesthesia was also reviewed. The risks                            and benefits of the procedure and the sedation                            options and risks were discussed with the patient.                            All questions were answered, and informed consent                            was obtained. Prior Anticoagulants: The patient has                            taken aspirin, last dose was 2 days prior to                            procedure. ASA Grade Assessment: II - A patient  with mild systemic disease. After reviewing the                            risks and benefits, the patient was deemed in                            satisfactory condition to undergo the procedure.                            After obtaining informed consent, the colonoscope                            was passed under direct vision. Throughout the                  procedure, the patient's blood pressure, pulse, and                            oxygen saturations were monitored continuously. The                            EC-[id number] ([id number]) scope was introduced through                            the anus and advanced to the the cecum, identified                            by appendiceal orifice and ileocecal valve. The                            colonoscopy was somewhat difficult due to a                            tortuous colon. Successful completion of the                            procedure was aided by COLOWRAP. The patient                            tolerated the procedure well. The quality of the                            bowel preparation was good. The ileocecal valve,                            appendiceal orifice, and rectum were photographed. Scope In: 12:26:09 PM Scope Out: 12:50:09 PM Scope Withdrawal Time: 0 hours 21 minutes 41 seconds  Total Procedure Duration: 0 hours 24 minutes 0 seconds  Findings:      The recto-sigmoid colon, sigmoid colon and descending colon were       moderately redundant.      A 8 mm polyp was found in the rectum. The polyp was semi-pedunculated.       The polyp was removed with a hot snare. Resection and retrieval were       complete.  External and internal hemorrhoids were found during retroflexion. The       hemorrhoids were small. Impression:               - Redundant LEFT colon.                           - One 8 mm polyp in the rectum, removed with a hot                            snare. Resected and retrieved.                           - External and internal hemorrhoids. Moderate Sedation:      Per Anesthesia Care Recommendation:           - Repeat colonoscopy in 5-10 years for surveillance.                           - High fiber diet and low fat diet. LOSE WEIGHT.                           - Return to my office in 6 months.                           - Continue present  medications.                           - Patient has a contact number available for                            emergencies. The signs and symptoms of potential                            delayed complications were discussed with the                            patient. Return to normal activities tomorrow.                            Written discharge instructions were provided to the                            patient. Procedure Code(s):        --- Professional ---                           [phone], Colonoscopy, flexible; with removal of                            tumor(s), polyp(s), or other lesion(s) by snare                            technique Diagnosis Code(s):        --- Professional ---  Z12.11, Encounter for screening for malignant                            neoplasm of colon                           K62.1, Rectal polyp                           K64.8, Other hemorrhoids                           Q43.8, Other specified congenital malformations of                            intestine CPT copyright 2016 American Medical Association. All rights reserved. The codes documented in this report are preliminary and upon coder review may  be revised to meet current compliance requirements. Barney Drain, MD Barney Drain MD, MD 08/08/2017 1:01:37 PM This report has been signed electronically. Number of Addenda: 0

## 2017-08-08 NOTE — Transfer of Care (Signed)
Immediate Anesthesia Transfer of Care Note  Patient: Vincent Ramos  Procedure(s) Performed: COLONOSCOPY WITH PROPOFOL (N/A )  Patient Location: PACU  Anesthesia Type:MAC  Level of Consciousness: awake and patient cooperative  Airway & Oxygen Therapy: Patient Spontanous Breathing and non-rebreather face mask  Post-op Assessment: Report given to RN and Post -op Vital signs reviewed and stable  Post vital signs: Reviewed and stable  Last Vitals:  Vitals:   08/08/17 1208 08/08/17 1209  BP:    Pulse:    Resp: 13 19  Temp:    SpO2: 96% 96%    Last Pain:  Vitals:   08/08/17 1120  TempSrc: Oral         Complications: No apparent anesthesia complications

## 2017-08-08 NOTE — H&P (Signed)
Primary Care Physician:  [name], MD Primary Gastroenterologist:  Dr. Oneida Alar  Pre-Procedure History & Physical: HPI:  Vincent Ramos is a 67 y.o. male here for COLON CANCER SCREENING.  Past Medical History:  Diagnosis Date  . Arthritis   . CAP (community acquired pneumonia) 07/31/2014  . Diabetes mellitus   . Enlarged prostate   . Hypertension   . Pneumonia   . Pneumonia   . Shortness of breath     Past Surgical History:  Procedure Laterality Date  . CIRCUMCISION    . ESOPHAGEAL DILATION N/A 06/07/2017   Procedure: ESOPHAGEAL DILATION;  Surgeon: Daneil Dolin, MD;  [facility];  Service: Endoscopy;  Laterality: N/A;  . ESOPHAGOGASTRODUODENOSCOPY (EGD) WITH PROPOFOL N/A 06/07/2017   Procedure: ESOPHAGOGASTRODUODENOSCOPY (EGD) WITH PROPOFOL;  Surgeon: Daneil Dolin, MD;  [facility];  Service: Endoscopy;  Laterality: N/A;  . HYDROCELE EXCISION  10/14/2011   Procedure: HYDROCELECTOMY ADULT;  Surgeon: Malka So, MD;  Location: [facility];  Service: Urology;  Laterality: Right;  . LACERATION REPAIR     left hand  . LUMBAR LAMINECTOMY/DECOMPRESSION MICRODISCECTOMY Right 10/07/2015   Procedure: Right Lumbar four-five ,lumbar five sacral-one Laminectomy;  Surgeon: Leeroy Cha, MD;  Location: [place] [facility];  Service: Neurosurgery;  Laterality: Right;    Prior to Admission medications   Medication Sig Start Date End Date Taking? Authorizing Provider  albuterol (PROVENTIL HFA;VENTOLIN HFA) 108 (90 Base) MCG/ACT inhaler Inhale 2 puffs into the lungs every 6 (six) hours as needed for wheezing or shortness of breath.   Yes [provider]  aspirin EC 81 MG tablet Take 81 mg by mouth daily.   Yes [provider]  atorvastatin (LIPITOR) 20 MG tablet Take 20 mg by mouth daily.   Yes [provider]  cloNIDine (CATAPRES) 0.2 MG tablet Take 0.2 mg by mouth 2 (two) times daily.  06/21/17  Yes [provider]  glipiZIDE  (GLUCOTROL) 10 MG tablet Take 10 mg by mouth 2 (two) times daily before a meal.   Yes [provider]  Insulin Degludec (TRESIBA FLEXTOUCH) 200 UNIT/ML SOPN Inject 50 Units into the skin daily.    Yes [provider]  ipratropium-albuterol (DUONEB) 0.5-2.5 (3) MG/3ML SOLN Take 3 mLs by nebulization every 6 (six) hours as needed (for shortness of breath or wheezing).  06/08/17  Yes [provider]  metFORMIN (GLUCOPHAGE) 1000 MG tablet Take 1,000 mg by mouth 2 (two) times daily. 05/06/17  Yes [provider]  olmesartan-hydrochlorothiazide (BENICAR HCT) 40-12.5 MG tablet Take 1 tablet by mouth daily.   Yes [provider]  Omega-3 Fatty Acids (FISH OIL PO) Take 1 capsule by mouth daily.   Yes [provider]  pantoprazole (PROTONIX) 40 MG tablet Take 1 tablet (40 mg total) daily by mouth. 06/09/17 07/26/17 Yes Johnson, Clanford L, MD  TRULICITY 1.5 HE/1.7EY SOPN Inject 1.5 mg into the skin every Saturday.  04/12/17  Yes [provider]  acetaminophen (TYLENOL) 325 MG tablet Take 325 mg by mouth every 6 (six) hours as needed for moderate pain or headache.    [provider]  sucralfate (CARAFATE) 1 g tablet Take 1 tablet (1 g total) 4 (four) times daily -  with meals and at bedtime for 2 days by mouth. Patient not taking: Reported on 07/26/2017 06/08/17 06/10/17  Murlean Iba, MD    Allergies as of 07/05/2017 - Review Complete 07/05/2017  Allergen Reaction Noted  . Victoza [  liraglutide] Itching and Rash 05/30/2017    Family History  Problem Relation Age of Onset  . Diabetes Unknown   . Hypertension Unknown   . CAD Unknown   . Cancer Unknown   . Anesthesia problems Neg Hx   . Hypotension Neg Hx   . Malignant hyperthermia Neg Hx   . Pseudochol deficiency Neg Hx   . Colon cancer Neg Hx   . Colon polyps Neg Hx     Social History   Socioeconomic History  . Marital status: Married    Spouse name: Not on file  .  Number of children: Not on file  . Years of education: Not on file  . Highest education level: Not on file  Social Needs  . Financial resource strain: Not on file  . Food insecurity - worry: Not on file  . Food insecurity - inability: Not on file  . Transportation needs - medical: Not on file  . Transportation needs - non-medical: Not on file  Occupational History  . Not on file  Tobacco Use  . Smoking status: Never Smoker  . Smokeless tobacco: Never Used  Substance and Sexual Activity  . Alcohol use: No    Alcohol/week: 0.0 oz    Frequency: Never    Comment: rare beer  . Drug use: No  . Sexual activity: Yes    Birth control/protection: None  Other Topics Concern  . Not on file  Social History Narrative  . Not on file    Review of Systems: See HPI, otherwise negative ROS   Physical Exam: BP (!) 180/90   Pulse 73   Temp 98.7 F (37.1 C) (Oral)   Resp 15   SpO2 98%  General:   Alert,  pleasant and cooperative in NAD Head:  Normocephalic and atraumatic. Neck:  Supple; Lungs:  Clear throughout to auscultation.    Heart:  Regular rate and rhythm. Abdomen:  Soft, nontender and nondistended. Normal bowel sounds, without guarding, and without rebound.   Neurologic:  Alert and  oriented x4;  grossly normal neurologically.  Impression/Plan:     SCREENING  Plan:  1. TCS TODAY DISCUSSED PROCEDURE, BENEFITS, & RISKS: < 1% chance of medication reaction, bleeding, perforation, or rupture of spleen/liver.

## 2017-08-08 NOTE — Anesthesia Preprocedure Evaluation (Signed)
Anesthesia Evaluation  Patient identified by MRN, date of birth, ID band Patient awake    Reviewed: Allergy & Precautions, NPO status , Patient's Chart, lab work & pertinent test results  Airway Mallampati: III  TM Distance: >3 FB Neck ROM: Full  Mouth opening: Limited Mouth Opening  Dental  (+) Teeth Intact   Pulmonary shortness of breath and with exertion, pneumonia, resolved,    Pulmonary exam normal breath sounds clear to auscultation       Cardiovascular Exercise Tolerance: Poor hypertension, Pt. on medications Normal cardiovascular exam Rhythm:Regular Rate:Normal    ECG: Normal sinus rhythm RSR' or QR pattern in V1 suggests right ventricular conduction delay Septal infarct , age undetermined Abnormal ECG  (OCT 2018)   Neuro/Psych negative neurological ROS     GI/Hepatic negative GI ROS, Neg liver ROS,   Endo/Other  diabetes, Poorly Controlled, Type 2, Oral Hypoglycemic Agents  Renal/GU Results for DAVINCI, GLOTFELTY (MRN [id number]) as of 06/07/2017 12:18  06/06/2017 05:55 Sodium: 138 Potassium: 3.7 Chloride: 100 (L) CO2: 31 Glucose: 230 (H) BUN: 17 Creatinine: 0.95      Musculoskeletal   Abdominal Normal abdominal exam  (+)   Peds  Hematology negative hematology ROS (+) Results for JENS, SIEMS (MRN [id number]) as of 06/07/2017 12:18  06/06/2017 05:55 Hemoglobin: 12.5 (L) HCT: 39.9 MCV: 82.1 MCH: 25.7 (L) MCHC: 31.3 RDW: 14.9 Platelets: 199    Anesthesia Other Findings   Reproductive/Obstetrics                             Anesthesia Physical Anesthesia Plan  ASA: III  Anesthesia Plan: MAC   Post-op Pain Management:    Induction: Intravenous  PONV Risk Score and Plan:   Airway Management Planned: Simple Face Mask  Additional Equipment:   Intra-op Plan:   Post-operative Plan:   Informed Consent: I have reviewed the patients History and Physical,  chart, labs and discussed the procedure including the risks, benefits and alternatives for the proposed anesthesia with the patient or authorized representative who has indicated his/her understanding and acceptance.     Plan Discussed with:   Anesthesia Plan Comments:         Anesthesia Quick Evaluation

## 2017-08-08 NOTE — Discharge Instructions (Signed)
You have MODERATE internal AND EXTERNAL HEMORRHOIDS and diverticulosis IN YOUR LEFT COLON. YOU HAD ONE POLYP REMOVED.    DRINK WATER TO KEEP YOUR URINE LIGHT YELLOW.  CONTINUE YOUR WEIGHT LOSS EFFORTS.  WHILE I DO NOT WANT TO ALARM YOU, YOUR BODY MASS INDEX IS OVER 40 WHICH MEANS YOU ARE MORBIDLY OBESE. OBESITY CAN ACTIVATE CANCER GENES. OBESITY IS ASSOCIATED WITH AN INCREASED RISK FOR CIRRHOSIS AND ALL CANCERS, INCLUDING ESOPHAGEAL AND COLON CANCER. A WEIGHT OF  187 LBS  WILL GET YOUR BODY MASS INDEX(BMI) UNDER 30.   FOLLOW A HIGH FIBER DIET. AVOID ITEMS THAT CAUSE BLOATING. See info below.  YOUR BIOPSY RESULTS WILL BE AVAILABLE IN MY CHART AFTER JAN 14 AND MY OFFICE WILL CONTACT YOU IN 10-14 DAYS WITH YOUR RESULTS.   USE PREPARATION H FOUR TIMES  A DAY IF NEEDED TO RELIEVE RECTAL PAIN/PRESSURE/BLEEDING.  FOLLOW UP IN 6 MOS TO REASSESS SWALLOWING.  Next colonoscopy in 5-10 years.  Colonoscopy Care After Read the instructions outlined below and refer to this sheet in the next week. These discharge instructions provide you with general information on caring for yourself after you leave the hospital. While your treatment has been planned according to the most current medical practices available, unavoidable complications occasionally occur. If you have any problems or questions after discharge, call DR. Lateef Juncaj, [phone].  ACTIVITY  You may resume your regular activity, but move at a slower pace for the next 24 hours.   Take frequent rest periods for the next 24 hours.   Walking will help get rid of the air and reduce the bloated feeling in your belly (abdomen).   No driving for 24 hours (because of the medicine (anesthesia) used during the test).   You may shower.   Do not sign any important legal documents or operate any machinery for 24 hours (because of the anesthesia used during the test).    NUTRITION  Drink plenty of fluids.   You may resume your normal diet as  instructed by your doctor.   Begin with a light meal and progress to your normal diet. Heavy or fried foods are harder to digest and may make you feel sick to your stomach (nauseated).   Avoid alcoholic beverages for 24 hours or as instructed.    MEDICATIONS  You may resume your normal medications.   WHAT YOU CAN EXPECT TODAY  Some feelings of bloating in the abdomen.   Passage of more gas than usual.   Spotting of blood in your stool or on the toilet paper  .  IF YOU HAD POLYPS REMOVED DURING THE COLONOSCOPY:  Eat a soft diet IF YOU HAVE NAUSEA, BLOATING, ABDOMINAL PAIN, OR VOMITING.    FINDING OUT THE RESULTS OF YOUR TEST Not all test results are available during your visit. DR. Oneida Alar WILL CALL YOU WITHIN 14 DAYS OF YOUR PROCEDUE WITH YOUR RESULTS. Do not assume everything is normal if you have not heard from DR. Shaune Malacara, CALL HER OFFICE AT [phone].  SEEK IMMEDIATE MEDICAL ATTENTION AND CALL THE OFFICE: [phone] IF:  You have more than a spotting of blood in your stool.   Your belly is swollen (abdominal distention).   You are nauseated or vomiting.   You have a temperature over 101F.   You have abdominal pain or discomfort that is severe or gets worse throughout the day.  High-Fiber Diet A high-fiber diet changes your normal diet to include more whole grains, legumes, fruits, and vegetables. Changes in the  diet involve replacing refined carbohydrates with unrefined foods. The calorie level of the diet is essentially unchanged. The Dietary Reference Intake (recommended amount) for adult males is 38 grams per day. For adult females, it is 25 grams per day. Pregnant and lactating women should consume 28 grams of fiber per day. Fiber is the intact part of a plant that is not broken down during digestion. Functional fiber is fiber that has been isolated from the plant to provide a beneficial effect in the body. PURPOSE  Increase stool bulk.   Ease and regulate  bowel movements.   Lower cholesterol.   REDUCE RISK OF COLON CANCER  INDICATIONS THAT YOU NEED MORE FIBER  Constipation and hemorrhoids.   Uncomplicated diverticulosis (intestine condition) and irritable bowel syndrome.   Weight management.   As a protective measure against hardening of the arteries (atherosclerosis), diabetes, and cancer.   GUIDELINES FOR INCREASING FIBER IN THE DIET  Start adding fiber to the diet slowly. A gradual increase of about 5 more grams (2 slices of whole-wheat bread, 2 servings of most fruits or vegetables, or 1 bowl of high-fiber cereal) per day is best. Too rapid an increase in fiber may result in constipation, flatulence, and bloating.   Drink enough water and fluids to keep your urine clear or pale yellow. Water, juice, or caffeine-free drinks are recommended. Not drinking enough fluid may cause constipation.   Eat a variety of high-fiber foods rather than one type of fiber.   Try to increase your intake of fiber through using high-fiber foods rather than fiber pills or supplements that contain small amounts of fiber.   The goal is to change the types of food eaten. Do not supplement your present diet with high-fiber foods, but replace foods in your present diet.   INCLUDE A VARIETY OF FIBER SOURCES  Replace refined and processed grains with whole grains, canned fruits with fresh fruits, and incorporate other fiber sources. White rice, white breads, and most bakery goods contain little or no fiber.   Brown whole-grain rice, buckwheat oats, and many fruits and vegetables are all good sources of fiber. These include: broccoli, Brussels sprouts, cabbage, cauliflower, beets, sweet potatoes, white potatoes (skin on), carrots, tomatoes, eggplant, squash, berries, fresh fruits, and dried fruits.   Cereals appear to be the richest source of fiber. Cereal fiber is found in whole grains and bran. Bran is the fiber-rich outer coat of cereal grain, which is  largely removed in refining. In whole-grain cereals, the bran remains. In breakfast cereals, the largest amount of fiber is found in those with "bran" in their names. The fiber content is sometimes indicated on the label.   You may need to include additional fruits and vegetables each day.   In baking, for 1 cup white flour, you may use the following substitutions:   1 cup whole-wheat flour minus 2 tablespoons.   1/2 cup white flour plus 1/2 cup whole-wheat flour.   Polyps, Colon  A polyp is extra tissue that grows inside your body. Colon polyps grow in the large intestine. The large intestine, also called the colon, is part of your digestive system. It is a long, hollow tube at the end of your digestive tract where your body makes and stores stool. Most polyps are not dangerous. They are benign. This means they are not cancerous. But over time, some types of polyps can turn into cancer. Polyps that are smaller than a pea are usually not harmful. But larger polyps could someday  become or may already be cancerous. To be safe, doctors remove all polyps and test them.   PREVENTION There is not one sure way to prevent polyps. You might be able to lower your risk of getting them if you:  Eat more fruits and vegetables and less fatty food.   Do not smoke.   Avoid alcohol.   Exercise every day.   Lose weight if you are overweight.   Eating more calcium and folate can also lower your risk of getting polyps. Some foods that are rich in calcium are milk, cheese, and broccoli. Some foods that are rich in folate are chickpeas, kidney beans, and spinach.    Diverticulosis Diverticulosis is a common condition that develops when small pouches (diverticula) form in the wall of the colon. The risk of diverticulosis increases with age. It happens more often in people who eat a low-fiber diet. Most individuals with diverticulosis have no symptoms. Those individuals with symptoms usually experience belly  (abdominal) pain, constipation, or loose stools (diarrhea).  HOME CARE INSTRUCTIONS  Increase the amount of fiber in your diet as directed by your caregiver or dietician. This may reduce symptoms of diverticulosis.   Drink at least 6 to 8 glasses of water each day to prevent constipation.   Try not to strain when you have a bowel movement.   Avoiding nuts and seeds to prevent complications is NOT NECESSARY.   FOODS HAVING HIGH FIBER CONTENT INCLUDE:  Fruits. Apple, peach, pear, tangerine, raisins, prunes.   Vegetables. Brussels sprouts, asparagus, broccoli, cabbage, carrot, cauliflower, romaine lettuce, spinach, summer squash, tomato, winter squash, zucchini.   Starchy Vegetables. Baked beans, kidney beans, lima beans, split peas, lentils, potatoes (with skin).   Grains. Whole wheat bread, brown rice, bran flake cereal, plain oatmeal, white rice, shredded wheat, bran muffins.   SEEK IMMEDIATE MEDICAL CARE IF:  You develop increasing pain or severe bloating.   You have an oral temperature above 101F.   You develop vomiting or bowel movements that are bloody or black.   Hemorrhoids Hemorrhoids are dilated (enlarged) veins around the rectum. Sometimes clots will form in the veins. This makes them swollen and painful. These are called thrombosed hemorrhoids. Causes of hemorrhoids include:  Constipation.   Straining to have a bowel movement.   HEAVY LIFTING   HOME CARE INSTRUCTIONS  Eat a well balanced diet and drink 6 to 8 glasses of water every day to avoid constipation. You may also use a bulk laxative.   Avoid straining to have bowel movements.   Keep anal area dry and clean.   Do not use a donut shaped pillow or sit on the toilet for long periods. This increases blood pooling and pain.   Move your bowels when your body has the urge; this will require less straining and will decrease pain and pressure.

## 2017-08-10 ENCOUNTER — Encounter (HOSPITAL_COMMUNITY): Payer: Self-pay | Admitting: Gastroenterology

## 2017-08-11 ENCOUNTER — Telehealth: Payer: Self-pay | Admitting: Gastroenterology

## 2017-08-11 NOTE — Telephone Encounter (Signed)
Please call pt. HE had a polypoid lesion removed and it was benign.   DRINK WATER TO KEEP YOUR URINE LIGHT YELLOW.  CONTINUE YOUR WEIGHT LOSS EFFORTS.   A WEIGHT OF  187 LBS  WILL GET YOUR BODY MASS INDEX(BMI) UNDER 30.   FOLLOW A HIGH FIBER DIET. AVOID ITEMS THAT CAUSE BLOATING.  USE PREPARATION H FOUR TIMES  A DAY IF NEEDED TO RELIEVE RECTAL PAIN/PRESSURE/BLEEDING.  FOLLOW UP IN 6 MOS TO REASSESS SWALLOWING.  Next colonoscopy in 10 years.

## 2017-08-14 ENCOUNTER — Other Ambulatory Visit (HOSPITAL_COMMUNITY): Payer: Self-pay | Admitting: Respiratory Therapy

## 2017-08-14 DIAGNOSIS — R0602 Shortness of breath: Secondary | ICD-10-CM

## 2017-08-14 NOTE — Telephone Encounter (Signed)
PATIENT SCHEDULED AND ON RECALL  °

## 2017-08-14 NOTE — Telephone Encounter (Signed)
Pt is aware.  

## 2017-08-21 ENCOUNTER — Ambulatory Visit: Payer: PPO | Attending: Internal Medicine | Admitting: Neurology

## 2017-08-21 DIAGNOSIS — Z794 Long term (current) use of insulin: Secondary | ICD-10-CM | POA: Insufficient documentation

## 2017-08-21 DIAGNOSIS — Z7982 Long term (current) use of aspirin: Secondary | ICD-10-CM | POA: Insufficient documentation

## 2017-08-21 DIAGNOSIS — G4733 Obstructive sleep apnea (adult) (pediatric): Secondary | ICD-10-CM | POA: Insufficient documentation

## 2017-08-21 DIAGNOSIS — R0683 Snoring: Secondary | ICD-10-CM | POA: Insufficient documentation

## 2017-08-21 DIAGNOSIS — Z79899 Other long term (current) drug therapy: Secondary | ICD-10-CM | POA: Diagnosis not present

## 2017-08-21 DIAGNOSIS — R0602 Shortness of breath: Secondary | ICD-10-CM

## 2017-08-21 DIAGNOSIS — G4730 Sleep apnea, unspecified: Secondary | ICD-10-CM | POA: Diagnosis present

## 2017-08-23 DIAGNOSIS — R0602 Shortness of breath: Secondary | ICD-10-CM | POA: Diagnosis not present

## 2017-08-23 DIAGNOSIS — R600 Localized edema: Secondary | ICD-10-CM | POA: Diagnosis not present

## 2017-08-23 DIAGNOSIS — E119 Type 2 diabetes mellitus without complications: Secondary | ICD-10-CM | POA: Diagnosis not present

## 2017-08-23 DIAGNOSIS — I10 Essential (primary) hypertension: Secondary | ICD-10-CM | POA: Diagnosis not present

## 2017-08-23 DIAGNOSIS — Z713 Dietary counseling and surveillance: Secondary | ICD-10-CM | POA: Diagnosis not present

## 2017-08-23 DIAGNOSIS — E1129 Type 2 diabetes mellitus with other diabetic kidney complication: Secondary | ICD-10-CM | POA: Diagnosis not present

## 2017-08-27 NOTE — Procedures (Signed)
[place] A. Merlene Laughter, MD     [email or web address]             NOCTURNAL POLYSOMNOGRAPHY   LOCATION: [facility]  Patient Name: Divante, Vincent Ramos Date: 08/21/2017 Gender: Male D.O.B: 02/21/1951 Age (years): 66 Referring Provider: Delphina Cahill Height (inches): 56 Interpreting Physician: Phillips Odor MD, ABSM Weight (lbs): 279 RPSGT: Rosebud Poles BMI: 63 MRN: [id number] Neck Size: 19.00 CLINICAL INFORMATION Sleep Study Type: NPSG     Indication for sleep study: N/A     Epworth Sleepiness Score: 6     SLEEP STUDY TECHNIQUE As per the AASM Manual for the Scoring of Sleep and Associated Events v2.3 (April 2016) with a hypopnea requiring 4% desaturations.  The channels recorded and monitored were frontal, central and occipital EEG, electrooculogram (EOG), submentalis EMG (chin), nasal and oral airflow, thoracic and abdominal wall motion, anterior tibialis EMG, snore microphone, electrocardiogram, and pulse oximetry.  MEDICATIONS Medications self-administered by patient taken the night of the study : N/A  Current Outpatient Medications:  .  acetaminophen (TYLENOL) 325 MG tablet, Take 325 mg by mouth every 6 (six) hours as needed for moderate pain or headache., Disp: , Rfl:  .  albuterol (PROVENTIL HFA;VENTOLIN HFA) 108 (90 Base) MCG/ACT inhaler, Inhale 2 puffs into the lungs every 6 (six) hours as needed for wheezing or shortness of breath., Disp: , Rfl:  .  aspirin EC 81 MG tablet, Take 81 mg by mouth daily., Disp: , Rfl:  .  atorvastatin (LIPITOR) 20 MG tablet, Take 20 mg by mouth daily., Disp: , Rfl:  .  cloNIDine (CATAPRES) 0.2 MG tablet, Take 0.2 mg by mouth 2 (two) times daily. , Disp: , Rfl:  .  glipiZIDE (GLUCOTROL) 10 MG tablet, Take 10 mg by mouth 2 (two) times daily before a meal., Disp: , Rfl:  .  Insulin Degludec (TRESIBA FLEXTOUCH) 200 UNIT/ML SOPN, Inject 50 Units into the skin daily. , Disp: , Rfl:  .  ipratropium-albuterol (DUONEB)  0.5-2.5 (3) MG/3ML SOLN, Take 3 mLs by nebulization every 6 (six) hours as needed (for shortness of breath or wheezing). , Disp: , Rfl:  .  metFORMIN (GLUCOPHAGE) 1000 MG tablet, Take 1,000 mg by mouth 2 (two) times daily., Disp: , Rfl:  .  olmesartan-hydrochlorothiazide (BENICAR HCT) 40-12.5 MG tablet, Take 1 tablet by mouth daily., Disp: , Rfl:  .  Omega-3 Fatty Acids (FISH OIL PO), Take 1 capsule by mouth daily., Disp: , Rfl:  .  pantoprazole (PROTONIX) 40 MG tablet, Take 1 tablet (40 mg total) daily by mouth., Disp: 30 tablet, Rfl: 0 .  TRULICITY 1.5 PF/7.9KW SOPN, Inject 1.5 mg into the skin every Saturday. , Disp: , Rfl:      SLEEP ARCHITECTURE The study was initiated at 10:39:05 PM and ended at 1:47:03 AM.  Sleep onset time was 11.8 minutes and the sleep efficiency was 22.1%. The total sleep time was 41.5 minutes.  Stage REM latency was N/A minutes.  The patient spent 16.87% of the night in stage N1 sleep, 83.13% in stage N2 sleep, 0.00% in stage N3 and 0.00% in REM.  Alpha intrusion was absent.  Supine sleep was 0.00%.  RESPIRATORY PARAMETERS The overall apnea/hypopnea index (AHI) was 75.2 per hour. There were 0 total apneas, including 0 obstructive, 0 central and 0 mixed apneas. There were 52 hypopneas and 0 RERAs.  The AHI during Stage REM sleep was N/A per hour.  AHI while supine was N/A per hour.  The mean oxygen saturation was  91.25%. The minimum SpO2 during sleep was 87.00%.  moderate snoring was noted during this study.  CARDIAC DATA The 2 lead EKG demonstrated sinus rhythm. The mean heart rate was 68.14 beats per minute. Other EKG findings include: None. LEG MOVEMENT DATA The total PLMS were 0 with a resulting PLMS index of 0.00. Associated arousal with leg movement index was 0.0.  IMPRESSIONS Severe obstructive sleep apnea is documented. AutoPAP 10-20 is recommended.   Absent slow wave and REM sleep are noted.    Vincent Metz, MD Diplomate, American  Board of Sleep Medicine.  ELECTRONICALLY SIGNED ON:  08/27/2017, 8:51 AM [place] PH: [phone]   FX: [phone] [place]

## 2017-08-29 ENCOUNTER — Inpatient Hospital Stay (HOSPITAL_COMMUNITY): Admission: RE | Admit: 2017-08-29 | Payer: Self-pay | Source: Ambulatory Visit

## 2017-09-08 DIAGNOSIS — M4322 Fusion of spine, cervical region: Secondary | ICD-10-CM | POA: Diagnosis not present

## 2017-09-08 DIAGNOSIS — J159 Unspecified bacterial pneumonia: Secondary | ICD-10-CM | POA: Diagnosis not present

## 2017-09-17 ENCOUNTER — Other Ambulatory Visit: Payer: Self-pay

## 2017-09-17 ENCOUNTER — Emergency Department (HOSPITAL_COMMUNITY): Payer: PPO

## 2017-09-17 ENCOUNTER — Inpatient Hospital Stay (HOSPITAL_COMMUNITY)
Admission: EM | Admit: 2017-09-17 | Discharge: 2017-09-19 | DRG: 193 | Disposition: A | Discharge status: Home / Self Care | Payer: PPO | Attending: Internal Medicine | Admitting: Internal Medicine

## 2017-09-17 ENCOUNTER — Encounter (HOSPITAL_COMMUNITY): Payer: Self-pay | Admitting: Emergency Medicine

## 2017-09-17 DIAGNOSIS — J181 Lobar pneumonia, unspecified organism: Secondary | ICD-10-CM | POA: Diagnosis not present

## 2017-09-17 DIAGNOSIS — I110 Hypertensive heart disease with heart failure: Secondary | ICD-10-CM | POA: Diagnosis present

## 2017-09-17 DIAGNOSIS — R778 Other specified abnormalities of plasma proteins: Secondary | ICD-10-CM

## 2017-09-17 DIAGNOSIS — Z888 Allergy status to other drugs, medicaments and biological substances status: Secondary | ICD-10-CM | POA: Diagnosis not present

## 2017-09-17 DIAGNOSIS — Z794 Long term (current) use of insulin: Secondary | ICD-10-CM | POA: Diagnosis not present

## 2017-09-17 DIAGNOSIS — I10 Essential (primary) hypertension: Secondary | ICD-10-CM | POA: Diagnosis not present

## 2017-09-17 DIAGNOSIS — Z6841 Body Mass Index (BMI) 40.0 and over, adult: Secondary | ICD-10-CM

## 2017-09-17 DIAGNOSIS — J441 Chronic obstructive pulmonary disease with (acute) exacerbation: Secondary | ICD-10-CM | POA: Diagnosis present

## 2017-09-17 DIAGNOSIS — I519 Heart disease, unspecified: Secondary | ICD-10-CM | POA: Insufficient documentation

## 2017-09-17 DIAGNOSIS — R7989 Other specified abnormal findings of blood chemistry: Secondary | ICD-10-CM

## 2017-09-17 DIAGNOSIS — R079 Chest pain, unspecified: Secondary | ICD-10-CM | POA: Diagnosis not present

## 2017-09-17 DIAGNOSIS — I5033 Acute on chronic diastolic (congestive) heart failure: Secondary | ICD-10-CM | POA: Diagnosis present

## 2017-09-17 DIAGNOSIS — J9690 Respiratory failure, unspecified, unspecified whether with hypoxia or hypercapnia: Secondary | ICD-10-CM | POA: Diagnosis present

## 2017-09-17 DIAGNOSIS — J40 Bronchitis, not specified as acute or chronic: Secondary | ICD-10-CM | POA: Diagnosis not present

## 2017-09-17 DIAGNOSIS — E119 Type 2 diabetes mellitus without complications: Secondary | ICD-10-CM | POA: Diagnosis not present

## 2017-09-17 DIAGNOSIS — R05 Cough: Secondary | ICD-10-CM | POA: Diagnosis not present

## 2017-09-17 DIAGNOSIS — R0789 Other chest pain: Secondary | ICD-10-CM | POA: Diagnosis not present

## 2017-09-17 DIAGNOSIS — Z79899 Other long term (current) drug therapy: Secondary | ICD-10-CM

## 2017-09-17 DIAGNOSIS — I5189 Other ill-defined heart diseases: Secondary | ICD-10-CM | POA: Insufficient documentation

## 2017-09-17 DIAGNOSIS — R0602 Shortness of breath: Secondary | ICD-10-CM | POA: Diagnosis not present

## 2017-09-17 DIAGNOSIS — Z7982 Long term (current) use of aspirin: Secondary | ICD-10-CM

## 2017-09-17 DIAGNOSIS — J440 Chronic obstructive pulmonary disease with acute lower respiratory infection: Secondary | ICD-10-CM | POA: Diagnosis present

## 2017-09-17 HISTORY — DX: Other ill-defined heart diseases: I51.89

## 2017-09-17 HISTORY — DX: Acute on chronic diastolic (congestive) heart failure: I50.33

## 2017-09-17 HISTORY — DX: Type 2 diabetes mellitus without complications: E11.9

## 2017-09-17 HISTORY — DX: Other specified abnormalities of plasma proteins: R77.8

## 2017-09-17 HISTORY — DX: Chronic obstructive pulmonary disease with (acute) exacerbation: J44.1

## 2017-09-17 HISTORY — DX: Other specified abnormal findings of blood chemistry: R79.89

## 2017-09-17 HISTORY — DX: Heart disease, unspecified: I51.9

## 2017-09-17 LAB — MAGNESIUM: MAGNESIUM: 1.9 mg/dL (ref 1.7–2.4)

## 2017-09-17 LAB — CBC WITH DIFFERENTIAL/PLATELET
BASOS ABS: 0 10*3/uL (ref 0.0–0.1)
BASOS PCT: 0 %
EOS ABS: 0.2 10*3/uL (ref 0.0–0.7)
Eosinophils Relative: 3 %
HEMATOCRIT: 40.7 % (ref 39.0–52.0)
HEMOGLOBIN: 12.5 g/dL — AB (ref 13.0–17.0)
Lymphocytes Relative: 15 %
Lymphs Abs: 1.1 10*3/uL (ref 0.7–4.0)
MCH: 25.1 pg — ABNORMAL LOW (ref 26.0–34.0)
MCHC: 30.7 g/dL (ref 30.0–36.0)
MCV: 81.7 fL (ref 78.0–100.0)
Monocytes Absolute: 0.8 10*3/uL (ref 0.1–1.0)
Monocytes Relative: 10 %
NEUTROS ABS: 5.3 10*3/uL (ref 1.7–7.7)
NEUTROS PCT: 72 %
Platelets: 190 10*3/uL (ref 150–400)
RBC: 4.98 MIL/uL (ref 4.22–5.81)
RDW: 15.3 % (ref 11.5–15.5)
WBC: 7.3 10*3/uL (ref 4.0–10.5)

## 2017-09-17 LAB — COMPREHENSIVE METABOLIC PANEL
ALBUMIN: 3.3 g/dL — AB (ref 3.5–5.0)
ALK PHOS: 64 U/L (ref 38–126)
ALT: 39 U/L (ref 17–63)
ANION GAP: 9 (ref 5–15)
AST: 21 U/L (ref 15–41)
BUN: 12 mg/dL (ref 6–20)
CALCIUM: 8.6 mg/dL — AB (ref 8.9–10.3)
CO2: 28 mmol/L (ref 22–32)
CREATININE: 1.07 mg/dL (ref 0.61–1.24)
Chloride: 109 mmol/L (ref 101–111)
GFR calc Af Amer: >60 mL/min (ref >60)
GFR calc non Af Amer: >60 mL/min (ref >60)
GLUCOSE: 134 mg/dL — AB (ref 65–99)
Potassium: 3.5 mmol/L (ref 3.5–5.1)
Sodium: 146 mmol/L — ABNORMAL HIGH (ref 135–145)
Total Bilirubin: 1.2 mg/dL (ref 0.3–1.2)
Total Protein: 6.1 g/dL — ABNORMAL LOW (ref 6.5–8.1)

## 2017-09-17 LAB — BRAIN NATRIURETIC PEPTIDE: B Natriuretic Peptide: 294 pg/mL — ABNORMAL HIGH (ref 0.0–100.0)

## 2017-09-17 LAB — TROPONIN I: TROPONIN I: 0.03 ng/mL — AB (ref <0.03)

## 2017-09-17 MED ORDER — FUROSEMIDE 10 MG/ML IJ SOLN
80.0000 mg | Freq: Once | INTRAMUSCULAR | Status: AC
  Administered 2017-09-17: 80 mg via INTRAVENOUS
  Filled 2017-09-17: qty 8

## 2017-09-17 MED ORDER — POTASSIUM CHLORIDE CRYS ER 20 MEQ PO TBCR
40.0000 meq | EXTENDED_RELEASE_TABLET | Freq: Once | ORAL | Status: AC
  Administered 2017-09-18: 40 meq via ORAL
  Filled 2017-09-17: qty 2

## 2017-09-17 MED ORDER — ALBUTEROL (5 MG/ML) CONTINUOUS INHALATION SOLN
10.0000 mg/h | INHALATION_SOLUTION | Freq: Once | RESPIRATORY_TRACT | Status: AC
  Administered 2017-09-17: 10 mg/h via RESPIRATORY_TRACT
  Filled 2017-09-17: qty 20

## 2017-09-17 MED ORDER — MAGNESIUM SULFATE 2 GM/50ML IV SOLN
2.0000 g | Freq: Once | INTRAVENOUS | Status: AC
  Administered 2017-09-18: 2 g via INTRAVENOUS
  Filled 2017-09-17: qty 50

## 2017-09-17 MED ORDER — PREDNISONE 10 MG PO TABS
60.0000 mg | ORAL_TABLET | Freq: Once | ORAL | Status: AC
  Administered 2017-09-17: 22:00:00 60 mg via ORAL
  Filled 2017-09-17: qty 1

## 2017-09-17 MED ORDER — ALBUTEROL SULFATE (2.5 MG/3ML) 0.083% IN NEBU
5.0000 mg | INHALATION_SOLUTION | Freq: Once | RESPIRATORY_TRACT | Status: AC
  Administered 2017-09-17: 5 mg via RESPIRATORY_TRACT
  Filled 2017-09-17: qty 6

## 2017-09-17 MED ORDER — AMOXICILLIN-POT CLAVULANATE 875-125 MG PO TABS
1.0000 | ORAL_TABLET | Freq: Once | ORAL | Status: AC
  Administered 2017-09-17: 1 via ORAL
  Filled 2017-09-17: qty 1

## 2017-09-17 NOTE — ED Notes (Signed)
Critical troponin 0.03  Dr Raliegh Ip informed

## 2017-09-17 NOTE — ED Triage Notes (Addendum)
Pt reports SOB, chest tightness and productive cough without fever 4 days. Denies hx of COPD or asthma. Sats 89-90% on RA.

## 2017-09-17 NOTE — ED Provider Notes (Signed)
[facility] EMERGENCY DEPARTMENT Provider Note   CSN: [id number] Arrival date & time: 09/17/17  2009     History   Chief Complaint Chief Complaint  Patient presents with  . Shortness of Breath    HPI Vincent Ramos is a 67 y.o. male.  He is here for evaluation of shortness of breath and chest tightness, cough for 14 days.  He feels like he has pneumonia.  He saw his PCP about 3 weeks ago, and was prescribed a antibiotic, had a delay in initiating treatment, and finished the prescription about 3 days ago.  His cough has been nonproductive.  He complains of leg swelling with discomfort in the legs.  His chest pain is persistent, and does not wax and wane.  No chronic chest or lung problems.  He denies nausea, vomiting, fever, chills, focal weakness or paresthesia.  There are no other known modifying factors.  He denies fever, chills, sweating, weakness or dizziness.  He has not had chronic respiratory symptoms or heart problems.  There are no other known modifying factors.    HPI  Past Medical History:  Diagnosis Date  . Arthritis   . CAP (community acquired pneumonia) 07/31/2014  . Diabetes mellitus   . Enlarged prostate   . Hypertension   . Pneumonia   . Pneumonia   . Shortness of breath     Patient Active Problem List   Diagnosis Date Noted  . Polyp of rectum   . Encounter for screening colonoscopy 07/09/2017  . Aspiration pneumonia ([place]) 06/06/2017  . Dysphagia   . Acute respiratory failure with hypoxia ([place]) 06/01/2017  . PNA (pneumonia) 05/30/2017  . Lumbar stenosis 10/07/2015  . Diabetes ([place]) 07/31/2014  . Hypertension 07/31/2014  . Obesity 07/31/2014  . Community acquired pneumonia 07/31/2014    Past Surgical History:  Procedure Laterality Date  . CIRCUMCISION    . COLONOSCOPY WITH PROPOFOL N/A 08/08/2017   Procedure: COLONOSCOPY WITH PROPOFOL;  Surgeon: Danie Binder, MD;  Location: [facility];  Service: Endoscopy;  Laterality: N/A;  1:00pm  .  ESOPHAGEAL DILATION N/A 06/07/2017   Procedure: ESOPHAGEAL DILATION;  Surgeon: Daneil Dolin, MD;  Location: [facility] ENDO SUITE;  Service: Endoscopy;  Laterality: N/A;  . ESOPHAGOGASTRODUODENOSCOPY (EGD) WITH PROPOFOL N/A 06/07/2017   Procedure: ESOPHAGOGASTRODUODENOSCOPY (EGD) WITH PROPOFOL;  Surgeon: Daneil Dolin, MD;  Location: [facility] END[facility];  Service: Endoscopy;  Laterality: N/A;  . HYDROCELE EXCISION  10/14/2011   Procedure: HYDROCELECTOMY ADULT;  Surgeon: Malka So, MD;  Location: [facility] ORS;  Service: Urology;  Laterality: Right;  . LACERATION REPAIR     left hand  . LUMBAR LAMINECTOMY/DECOMPRESSION MICRODISCECTOMY Right 10/07/2015   Procedure: Right Lumbar four-five ,lumbar five sacral-one Laminectomy;  Surgeon: Leeroy Cha, MD;  Location: [place] NEURO ORS;  Service: Neurosurgery;  Laterality: Right;       Home Medications    Prior to Admission medications   Medication Sig Start Date End Date Taking? Authorizing Provider  acetaminophen (TYLENOL) 325 MG tablet Take 325 mg by mouth every 6 (six) hours as needed for moderate pain or headache.    [provider]  albuterol (PROVENTIL HFA;VENTOLIN HFA) 108 (90 Base) MCG/ACT inhaler Inhale 2 puffs into the lungs every 6 (six) hours as needed for wheezing or shortness of breath.    [provider]  aspirin EC 81 MG tablet Take 81 mg by mouth daily.    [provider]  atorvastatin (LIPITOR) 20 MG tablet Take 20 mg  by mouth daily.    [provider]  cloNIDine (CATAPRES) 0.2 MG tablet Take 0.2 mg by mouth 2 (two) times daily.  06/21/17   [provider]  glipiZIDE (GLUCOTROL) 10 MG tablet Take 10 mg by mouth 2 (two) times daily before a meal.    [provider]  Insulin Degludec (TRESIBA FLEXTOUCH) 200 UNIT/ML SOPN Inject 50 Units into the skin daily.     [provider]  ipratropium-albuterol (DUONEB) 0.5-2.5 (3) MG/3ML SOLN Take 3 mLs by nebulization every 6 (six) hours as needed  (for shortness of breath or wheezing).  06/08/17   [provider]  metFORMIN (GLUCOPHAGE) 1000 MG tablet Take 1,000 mg by mouth 2 (two) times daily. 05/06/17   [provider]  olmesartan-hydrochlorothiazide (BENICAR HCT) 40-12.5 MG tablet Take 1 tablet by mouth daily.    [provider]  Omega-3 Fatty Acids (FISH OIL PO) Take 1 capsule by mouth daily.    [provider]  pantoprazole (PROTONIX) 40 MG tablet Take 1 tablet (40 mg total) daily by mouth. 06/09/17 07/26/17  Johnson, Clanford L, MD  TRULICITY 1.5 CV/8.9FY SOPN Inject 1.5 mg into the skin every Saturday.  04/12/17   [provider]    Family History Family History  Problem Relation Age of Onset  . Diabetes Unknown   . Hypertension Unknown   . CAD Unknown   . Cancer Unknown   . Anesthesia problems Neg Hx   . Hypotension Neg Hx   . Malignant hyperthermia Neg Hx   . Pseudochol deficiency Neg Hx   . Colon cancer Neg Hx   . Colon polyps Neg Hx     Social History Social History   Tobacco Use  . Smoking status: Never Smoker  . Smokeless tobacco: Never Used  Substance Use Topics  . Alcohol use: No    Alcohol/week: 0.0 oz    Frequency: Never    Comment: rare beer  . Drug use: No     Allergies   Victoza [liraglutide]   Review of Systems Review of Systems  All other systems reviewed and are negative.    Physical Exam Updated Vital Signs BP (!) 156/73   Pulse 86   Temp 98.2 F (36.8 C) (Oral)   Resp (!) 21   Ht 5' 6.5" (1.689 m)   Wt 118.8 kg (262 lb)   SpO2 96%   BMI 41.65 kg/m   Physical Exam  Constitutional: He is oriented to person, place, and time. He appears well-developed. He appears ill (Uncomfortable). He is not intubated.  Obese  HENT:  Head: Normocephalic and atraumatic.  Right Ear: External ear normal.  Left Ear: External ear normal.  Eyes: Conjunctivae and EOM are normal. Pupils are equal, round, and reactive to light.  Neck: Normal range of  motion and phonation normal. Neck supple.  Cardiovascular: Normal rate, regular rhythm and normal heart sounds.  Pulmonary/Chest: Effort normal. No accessory muscle usage. No tachypnea. He is not intubated. No respiratory distress. He has decreased breath sounds in the right middle field, the right lower field, the left middle field and the left lower field. He has no wheezes. He has no rhonchi. He has no rales. He exhibits no bony tenderness.  Abdominal: Soft. There is no tenderness.  Musculoskeletal: Normal range of motion.       Right lower leg: He exhibits edema.       Left lower leg: He exhibits edema.  Neurological: He is alert and oriented to person,  place, and time. No cranial nerve deficit or sensory deficit. He exhibits normal muscle tone. Coordination normal.  Skin: Skin is warm, dry and intact.  Psychiatric: He has a normal mood and affect. His behavior is normal. Judgment and thought content normal.  Nursing note and vitals reviewed.    ED Treatments / Results  Labs (all labs ordered are listed, but only abnormal results are displayed) Labs Reviewed  CBC WITH DIFFERENTIAL/PLATELET - Abnormal; Notable for the following components:      Result Value   Hemoglobin 12.5 (*)    MCH 25.1 (*)    All other components within normal limits  COMPREHENSIVE METABOLIC PANEL  BRAIN NATRIURETIC PEPTIDE  TROPONIN I    EKG  EKG Interpretation  Date/Time:  Sunday September 17 2017 20:32:28 EST Ventricular Rate:  88 PR Interval:    QRS Duration: 93 QT Interval:  382 QTC Calculation: 463 R Axis:   94 Text Interpretation:  Sinus rhythm Multiple premature complexes, vent & supraven Right axis deviation Probable anteroseptal infarct, old Since last tracing pvc is new Confirmed by Daleen Bo [phone]) on 09/17/2017 9:36:03 PM       Radiology Dg Chest 2 View  Result Date: 09/17/2017 CLINICAL DATA:  Chest pain short of breath EXAM: CHEST  2 VIEW COMPARISON:  CT chest 06/02/2017,  radiograph 06/01/2017 FINDINGS: Small bilateral pleural effusions or thickening. Stable cardiomegaly with vascular congestion. Central airways thickening. Streaky atelectasis or infiltrate at the left base. No pneumothorax. IMPRESSION: 1. Small bilateral pleural effusions or thickening with streaky atelectasis or infiltrate at the left base 2. Cardiomegaly with vascular congestion. Electronically Signed   By: Donavan Foil M.D.   On: 09/17/2017 21:46    Procedures Procedures (including critical care time)  Medications Ordered in ED Medications  albuterol (PROVENTIL,VENTOLIN) solution continuous neb (not administered)  predniSONE (DELTASONE) tablet 60 mg (not administered)  amoxicillin-clavulanate (AUGMENTIN) 875-125 MG per tablet 1 tablet (not administered)  albuterol (PROVENTIL) (2.5 MG/3ML) 0.083% nebulizer solution 5 mg (5 mg Nebulization Given 09/17/17 2038)     Initial Impression / Assessment and Plan / ED Course  I have reviewed the triage vital signs and the nursing notes.  Pertinent labs & imaging results that were available during my care of the patient were reviewed by me and considered in my medical decision making (see chart for details).  Clinical Course as of Sep 17 2208  Nancy Fetter Sep 17, 2017  2156 Initial evaluation, consistent with respiratory compromise from bronchitis.  Possible pneumonia but white blood cell count is normal.  Will add additional treatment with nebulizer, continuous and prednisone and oral dose of antibiotic.  [EW]  2209 Nonspecific pleural effusions with vascular congestion, and left lower lobe atelectasis versus infiltrate.  [EW]    Clinical Course User Index [EW] Daleen Bo, MD     Patient Vitals for the past 24 hrs:  BP Temp Temp src Pulse Resp SpO2 Height Weight  09/17/17 2130 (!) 156/73 - - 86 - 96 % - -  09/17/17 2100 (!) 195/86 - - 93 (!) 21 91 % - -  09/17/17 2045 (!) 190/95 - - 86 16 100 % - -  09/17/17 2042 - - - - - 91 % - -  09/17/17  2018 - 98.2 F (36.8 C) Oral 99 19 100 % 5' 6.5" (1.689 m) 118.8 kg (262 lb)    10:05 PM Reevaluation with update and discussion. After initial assessment and treatment, an updated evaluation reveals he is more comfortable after  initial nebulizer, and feels better.  He is updated on findings and plan. Daleen Bo      Final Clinical Impressions(s) / ED Diagnoses   Final diagnoses:  Bronchitis   Evaluation is consistent with persistent bronchitis despite a initial treatment course of antibiotic.  Doubt sepsis or metabolic instability.  Doubt acute congestive heart failure.  Doubt ACS.  Second nebulizer and prednisone and oral antibiotic were ordered, by me and care was transferred  Nursing Notes Reviewed/ Care Coordinated Applicable Imaging Reviewed Interpretation of Laboratory Data incorporated into ED treatment   Plan-disposition per oncoming team following completion of treatment.  ED Discharge Orders    None       Daleen Bo, MD 09/17/17 2210

## 2017-09-17 NOTE — H&P (Signed)
History and Physical    Vincent Ramos VFI:[id number] DOB: July 29, 1951 DOA: 09/17/2017  PCP: [name], MD   Patient coming from:.  Home.  I have personally briefly reviewed patient's old medical records in [place]  Chief Complaint: Shortness of breath and productive cough.  HPI: Vincent Ramos is a 67 y.o. male with medical history significant of arthritis, history of community-acquired pneumonia, BPH, hypertension, type 2 diabetes, grade 2 diastolic dysfunction on last echocardiogram in February 2017 who is coming to the emergency department with complaint of progressively worse dyspnea associated with chest tightness and productive cough of whitish sputum for the past 2 weeks or so. However, he mentions that that he has been having worsening of his symptoms for the past 4-5 days now associated with pleuritic chest pain, lower thoracic wall and upper abdominal pain when coughing, wheezing, and lower extremity edema.    He also complains of occasional postural dizziness, but this was happening before he developed these symptoms.  He denies fever, chills, but feels fatigued.  Mild sore throat from coughing, but denies rhinorrhea, hemoptysis, nausea, emesis, diarrhea, constipation, melena or hematochezia.  Denies dysuria, frequency or hematuria.  Denies heat or cold intolerance.  No polyuria, no polydipsia or blurred vision.  Denies skin rashes or pruritus.  He has states that he has being noncompliant with his medications, diet and fluid intake.  He was seen by his PCP 2-3 weeks ago and was given an antibiotic, which he just finished 3 days ago, but did not seem to help his symptoms.   ED Course: Initial vital signs in the emergency department showed a temperature 36.8C (98.53F), pulse 99, respirations 19, blood pressure 190/95 mmHg and O2 sat 91% on room air.  EKG was sinus rhythm with multiple APCs and PVCs, right axis deviation and questionable old anteroseptal infarct.   Initial troponin was 0.03 ng/mL.  BNP was 294.0 pg/mL.  CBC showed a white count of 7.3 with a normal differential, hemoglobin 12.5 g/dL and platelets 190.  Hemoglobin level from late October until early November ranged from 12.5-13.4 g/dL.  CMP shows a sodium of 146, potassium 3.5, chloride 109 and CO2 28 mmol/L.  Glucose 134, BUN 12, creatinine 1.07, calcium 8.6 and magnesium 1.9 mg/dL.    Imaging: a 2 view chest radiograph shows small bilateral pleural effusions or thickening with streaky atelectasis or infiltrate at the left base, cardiomegaly with vascular congestion.  Please see images and full radiology report for further detail.  Treatment in the ED: He received  supplemental oxygen, a 10 mg and a 5 mg continuous albuterol nebulization, furosemide 80 mg IVP x1, Augmentin 875 mg p.o. x1 and prednisone 60 mg p.o. x1.  I added K-Dur 40 mEq x1 dose and 2 g of magnesium sulfate IVPB.   Review of Systems: As per HPI otherwise 10 point review of systems negative.    Past Medical History:  Diagnosis Date  . Arthritis   . CAP (community acquired pneumonia) 07/31/2014  . Enlarged prostate   . Hypertension   . Pneumonia   . Pneumonia   . Shortness of breath   . Type 2 diabetes mellitus ([place])     Past Surgical History:  Procedure Laterality Date  . CIRCUMCISION    . COLONOSCOPY WITH PROPOFOL N/A 08/08/2017   Procedure: COLONOSCOPY WITH PROPOFOL;  Surgeon: Danie Binder, MD;  [facility];  Service: Endoscopy;  Laterality: N/A;  1:00pm  .  ESOPHAGEAL DILATION N/A 06/07/2017   Procedure: ESOPHAGEAL DILATION;  Surgeon: Daneil Dolin, MD;  [facility];  Service: Endoscopy;  Laterality: N/A;  . ESOPHAGOGASTRODUODENOSCOPY (EGD) WITH PROPOFOL N/A 06/07/2017   Procedure: ESOPHAGOGASTRODUODENOSCOPY (EGD) WITH PROPOFOL;  Surgeon: Daneil Dolin, MD;  [facility];  Service: Endoscopy;  Laterality: N/A;  . HYDROCELE EXCISION  10/14/2011   Procedure: HYDROCELECTOMY  ADULT;  Surgeon: Malka So, MD;  Location: [facility] ORS;  Service: Urology;  Laterality: Right;  . LACERATION REPAIR     left hand  . LUMBAR LAMINECTOMY/DECOMPRESSION MICRODISCECTOMY Right 10/07/2015   Procedure: Right Lumbar four-five ,lumbar five sacral-one Laminectomy;  Surgeon: Leeroy Cha, MD;  Location: [place] NEURO ORS;  Service: Neurosurgery;  Laterality: Right;     reports that  has never smoked. he has never used smokeless tobacco. He reports that he does not drink alcohol or use drugs.  Allergies  Allergen Reactions  . Victoza [Liraglutide] Itching and Rash    Family History  Problem Relation Age of Onset  . Diabetes Unknown   . Hypertension Unknown   . CAD Unknown   . Cancer Unknown   . Anesthesia problems Neg Hx   . Hypotension Neg Hx   . Malignant hyperthermia Neg Hx   . Pseudochol deficiency Neg Hx   . Colon cancer Neg Hx   . Colon polyps Neg Hx      Prior to Admission medications   Medication Sig Start Date End Date Taking? Authorizing Provider  acetaminophen (TYLENOL) 325 MG tablet Take 325 mg by mouth every 6 (six) hours as needed for moderate pain or headache.   Yes [provider]  albuterol (PROVENTIL HFA;VENTOLIN HFA) 108 (90 Base) MCG/ACT inhaler Inhale 2 puffs into the lungs every 6 (six) hours as needed for wheezing or shortness of breath.   Yes [provider]  aspirin EC 81 MG tablet Take 81 mg by mouth daily.   Yes [provider]  cloNIDine (CATAPRES) 0.2 MG tablet Take 0.2 mg by mouth 2 (two) times daily.  06/21/17  Yes [provider]  glipiZIDE (GLUCOTROL) 10 MG tablet Take 10 mg by mouth 2 (two) times daily before a meal.   Yes [provider]  Insulin Degludec (TRESIBA FLEXTOUCH) 200 UNIT/ML SOPN Inject 40-50 Units into the skin daily.    Yes [provider]  ipratropium-albuterol (DUONEB) 0.5-2.5 (3) MG/3ML SOLN Take 3 mLs by nebulization every 6 (six) hours as needed (for shortness of breath or  wheezing).  06/08/17  Yes [provider]  metFORMIN (GLUCOPHAGE) 1000 MG tablet Take 1,000 mg by mouth 2 (two) times daily. 05/06/17  Yes [provider]  olmesartan-hydrochlorothiazide (BENICAR HCT) 40-12.5 MG tablet Take 1 tablet by mouth daily.   Yes [provider]  pantoprazole (PROTONIX) 40 MG tablet Take 1 tablet (40 mg total) daily by mouth. 06/09/17 09/17/17 Yes Johnson, Clanford L, MD  TRULICITY 1.5 RK/2.7CW SOPN Inject 1.5 mg into the skin every Saturday.  04/12/17  Yes [provider]    Physical Exam: Vitals:   09/17/17 2045 09/17/17 2100 09/17/17 2130 09/17/17 2241  BP: (!) 190/95 (!) 195/86 (!) 156/73   Pulse: 86 93 86   Resp: 16 (!) 21    Temp:      TempSrc:      SpO2: 100% 91% 96% 96%  Weight:      Height:        Constitutional: NAD, calm, comfortable Eyes: PERRL, lids and  conjunctivae normal ENMT: Mucous membranes are moist. Posterior pharynx clear of any exudate or lesions. Neck: normal, supple, no masses, no thyromegaly Respiratory: Decreased breath sounds with bilateral wheezing and mild rhonchi.  Mild bibasilar crackles. Normal respiratory effort. No accessory muscle use.  Cardiovascular: Regular rate and rhythm, no murmurs / rubs / gallops.  3+ lower extremities pitting edema. 2+ pedal pulses. No carotid bruits.  Abdomen: Obese, soft, no tenderness, no masses palpated. No hepatosplenomegaly. Bowel sounds positive.  Musculoskeletal: no clubbing / cyanosis.  Good ROM, no contractures. Normal muscle tone.  Skin: no rashes, lesions, ulcers on limited dermatological exam. Neurologic: CN 2-12 grossly intact. Sensation intact, DTR normal. Strength 5/5 in all 4.  Psychiatric: Normal judgment and insight. Alert and oriented x 4.. Normal mood.    Labs on Admission: I have personally reviewed following labs and imaging studies  CBC: Recent Labs  Lab 09/17/17 2125  WBC 7.3  NEUTROABS 5.3  HGB 12.5*  HCT 40.7  MCV 81.7  PLT 235    Basic Metabolic Panel: Recent Labs  Lab 09/17/17 2125  NA 146*  K 3.5  CL 109  CO2 28  GLUCOSE 134*  BUN 12  CREATININE 1.07  CALCIUM 8.6*   GFR: Estimated Creatinine Clearance: 85.3 mL/min (by C-G formula based on SCr of 1.07 mg/dL). Liver Function Tests: Recent Labs  Lab 09/17/17 2125  AST 21  ALT 39  ALKPHOS 64  BILITOT 1.2  PROT 6.1*  ALBUMIN 3.3*   No results for input(s): LIPASE, AMYLASE in the last 168 hours. No results for input(s): AMMONIA in the last 168 hours. Coagulation Profile: No results for input(s): INR, PROTIME in the last 168 hours. Cardiac Enzymes: Recent Labs  Lab 09/17/17 2125  TROPONINI 0.03*   BNP (last 3 results) No results for input(s): PROBNP in the last 8760 hours. HbA1C: No results for input(s): HGBA1C in the last 72 hours. CBG: No results for input(s): GLUCAP in the last 168 hours. Lipid Profile: No results for input(s): CHOL, HDL, LDLCALC, TRIG, CHOLHDL, LDLDIRECT in the last 72 hours. Thyroid Function Tests: No results for input(s): TSH, T4TOTAL, FREET4, T3FREE, THYROIDAB in the last 72 hours. Anemia Panel: No results for input(s): VITAMINB12, FOLATE, FERRITIN, TIBC, IRON, RETICCTPCT in the last 72 hours. Urine analysis: No results found for: COLORURINE, APPEARANCEUR, [place], [place], [place], [place], [place], [place], [place], [place], NITRITE, LEUKOCYTESUR  Radiological Exams on Admission: Dg Chest 2 View  Result Date: 09/17/2017 CLINICAL DATA:  Chest pain short of breath EXAM: CHEST  2 VIEW COMPARISON:  CT chest 06/02/2017, radiograph 06/01/2017 FINDINGS: Small bilateral pleural effusions or thickening. Stable cardiomegaly with vascular congestion. Central airways thickening. Streaky atelectasis or infiltrate at the left base. No pneumothorax. IMPRESSION: 1. Small bilateral pleural effusions or thickening with streaky atelectasis or infiltrate at the left base 2. Cardiomegaly with vascular congestion.  Electronically Signed   By: Donavan Foil M.D.   On: 09/17/2017 21:46   Echocardiogram 09/03/2015 ------------------------------------------------------------------- LV EF: 60% -   65%  ------------------------------------------------------------------- Indications:      Chest pain 786.51.  Dyspnea 786.09.  ------------------------------------------------------------------- History:   PMH:  Obesity.  Risk factors:  Hypertension. Diabetes mellitus.  ------------------------------------------------------------------- Study Conclusions  - Left ventricle: The cavity size was normal. Wall thickness was   increased in a pattern of moderate LVH. Systolic function was   normal. The estimated ejection fraction was in the range of 60%   to 65%. Although no diagnostic regional wall motion abnormality   was identified, this  possibility cannot be completely excluded on   the basis of this study. Features are consistent with a   pseudonormal left ventricular filling pattern, with concomitant   abnormal relaxation and increased filling pressure (grade 2   diastolic dysfunction). - Aortic valve: Mildly calcified annulus. Trileaflet; mildly   calcified leaflets. - Aortic root: The aortic root was mildly dilated. - Mitral valve: There was trivial regurgitation. - Left atrium: The atrium was at the upper limits of normal in   size. - Right atrium: Central venous pressure (est): 3 mm Hg. - Atrial septum: No defect or patent foramen ovale was identified. - Tricuspid valve: There was physiologic regurgitation. - Pulmonary arteries: Systolic pressure could not be accurately   estimated. - Pericardium, extracardiac: There was no pericardial effusion.  Impressions:  - Moderate LVH with LVEF 60-65%, no definite wall motion   abnormalities with somewhat limited images. Grade 2 diastolic   dysfunction. Upper normal left atrial chamber size. Mildly   dilated aortic root. Mildly calcified aortic  valve. Trivial   mitral and tricuspid regurgitation.   EKG: Independently reviewed. Vent. rate 88 BPM PR interval * ms QRS duration 93 ms QT/QTc 382/463 ms P-R-T axes 51 94 53 Sinus rhythm Multiple premature complexes, vent & supraven Right axis deviation Probable anteroseptal infarct, old  Assessment/Plan Principal Problem:   COPD exacerbation (HCC) Observation/telemetry. Continue supplemental oxygen. Continue scheduled and as needed bronchodilators. Continue glucocorticoids.  Will low dose methylprednisolone. Continue treatment for acute diastolic CHF.  Active Problems:   Acute on chronic diastolic CHF (congestive heart failure) (HCC) Continue supplemental oxygen. Continue COPD exacerbation treatment. Furosemide 40 mg IVP twice daily with potassium supplementation. Trend troponin levels. Check echocardiogram. Consider switching clonidine to beta-blockers once decompensation resolved.    Elevated troponin Continue cardiac telemetry monitoring. Trend troponin level. Echocardiogram to be done later as stated above.    Type 2 diabetes mellitus (HCC) Last hemoglobin A1c was 9.3 in February 2017. Carbohydrate modified diet. Continue to receive insulin 40-50 units in the skin daily. Continue glipizide 10 mg p.o. twice daily. Continue metformin 1000 mg p.o. twice daily. Continue Trulicity 1.5 mg SQ every week.    Hypertension Continue clonidine 0.2 mg p.o. twice daily. However, I think that the patient would benefit from beta-blockade therapy. Clonidine could be taper off and beta-blocker could be  started once CHF decompensation phase is over.  Continue Benicar HCT 40-12.5 mg p.o. daily.    DVT prophylaxis: Lovenox SQ. Code Status: Full code. Family Communication:  Disposition Plan: Observation for 24-48 hours for COPD exacerbation treatment. Consults called:  Admission status: Observation/telemetry.   Reubin Milan MD [facility] Pager  [phone].  If 7PM-7AM, please contact night-coverage [email or web address] Password [facility]  09/17/2017, 11:42 PM   This document was prepared using Dragon voice recognition software may contain some unintended transcription errors.

## 2017-09-17 NOTE — ED Notes (Signed)
Dr Eulis Foster in to reassess

## 2017-09-18 ENCOUNTER — Encounter (HOSPITAL_COMMUNITY): Payer: Self-pay

## 2017-09-18 ENCOUNTER — Other Ambulatory Visit: Payer: Self-pay

## 2017-09-18 ENCOUNTER — Observation Stay (HOSPITAL_BASED_OUTPATIENT_CLINIC_OR_DEPARTMENT_OTHER): Payer: PPO

## 2017-09-18 DIAGNOSIS — E119 Type 2 diabetes mellitus without complications: Secondary | ICD-10-CM | POA: Diagnosis present

## 2017-09-18 DIAGNOSIS — I110 Hypertensive heart disease with heart failure: Secondary | ICD-10-CM | POA: Diagnosis present

## 2017-09-18 DIAGNOSIS — I5033 Acute on chronic diastolic (congestive) heart failure: Secondary | ICD-10-CM | POA: Diagnosis not present

## 2017-09-18 DIAGNOSIS — J440 Chronic obstructive pulmonary disease with acute lower respiratory infection: Secondary | ICD-10-CM | POA: Diagnosis present

## 2017-09-18 DIAGNOSIS — J40 Bronchitis, not specified as acute or chronic: Secondary | ICD-10-CM | POA: Diagnosis present

## 2017-09-18 DIAGNOSIS — I10 Essential (primary) hypertension: Secondary | ICD-10-CM

## 2017-09-18 DIAGNOSIS — Z6841 Body Mass Index (BMI) 40.0 and over, adult: Secondary | ICD-10-CM | POA: Diagnosis not present

## 2017-09-18 DIAGNOSIS — Z888 Allergy status to other drugs, medicaments and biological substances status: Secondary | ICD-10-CM | POA: Diagnosis not present

## 2017-09-18 DIAGNOSIS — J181 Lobar pneumonia, unspecified organism: Secondary | ICD-10-CM | POA: Diagnosis present

## 2017-09-18 DIAGNOSIS — J441 Chronic obstructive pulmonary disease with (acute) exacerbation: Secondary | ICD-10-CM

## 2017-09-18 DIAGNOSIS — Z7982 Long term (current) use of aspirin: Secondary | ICD-10-CM | POA: Diagnosis not present

## 2017-09-18 DIAGNOSIS — J9690 Respiratory failure, unspecified, unspecified whether with hypoxia or hypercapnia: Secondary | ICD-10-CM | POA: Diagnosis present

## 2017-09-18 DIAGNOSIS — Z79899 Other long term (current) drug therapy: Secondary | ICD-10-CM | POA: Diagnosis not present

## 2017-09-18 DIAGNOSIS — Z794 Long term (current) use of insulin: Secondary | ICD-10-CM | POA: Diagnosis not present

## 2017-09-18 HISTORY — DX: Respiratory failure, unspecified, unspecified whether with hypoxia or hypercapnia: J96.90

## 2017-09-18 LAB — ECHOCARDIOGRAM COMPLETE
CHL CUP DOP CALC LVOT VTI: 21.3 cm
E decel time: 176 msec
EERAT: 11.42
FS: 29 % (ref 28–44)
HEIGHTINCHES: 67 in
IVS/LV PW RATIO, ED: 0.99
LA diam index: 1.6 cm/m2
LA vol A4C: 49.7 ml
LA vol index: 26.2 mL/m2
LA vol: 64.1 mL
LASIZE: 39 mm
LEFT ATRIUM END SYS DIAM: 39 mm
LV E/e'average: 11.42
LV dias vol index: 50 mL/m2
LV dias vol: 121 mL (ref 62–150)
LV e' LATERAL: 9.46 cm/s
LV sys vol index: 20 mL/m2
LVEEMED: 11.42
LVOT SV: 67 mL
LVOT area: 3.14 cm2
LVOT peak grad rest: 6 mmHg
LVOTD: 20 mm
LVOTPV: 119 cm/s
LVSYSVOL: 48 mL
Lateral S' vel: 15.6 cm/s
MV Dec: 176
MV Peak grad: 5 mmHg
MV pk A vel: 67.9 m/s
MV pk E vel: 108 m/s
PW: 17.6 mm — AB (ref 0.6–1.1)
RV TAPSE: 26.4 mm
Simpson's disk: 61
Stroke v: 73 ml
TDI e' lateral: 9.46
TDI e' medial: 5.55
WEIGHTICAEL: 4239.89 [oz_av]

## 2017-09-18 LAB — BASIC METABOLIC PANEL
Anion gap: 12 (ref 5–15)
BUN: 14 mg/dL (ref 6–20)
CHLORIDE: 102 mmol/L (ref 101–111)
CO2: 27 mmol/L (ref 22–32)
CREATININE: 1.22 mg/dL (ref 0.61–1.24)
Calcium: 8.6 mg/dL — ABNORMAL LOW (ref 8.9–10.3)
GFR calc non Af Amer: >60 mL/min (ref >60)
Glucose, Bld: 327 mg/dL — ABNORMAL HIGH (ref 65–99)
Potassium: 4 mmol/L (ref 3.5–5.1)
SODIUM: 141 mmol/L (ref 135–145)

## 2017-09-18 LAB — GLUCOSE, CAPILLARY
GLUCOSE-CAPILLARY: 181 mg/dL — AB (ref 65–99)
Glucose-Capillary: 240 mg/dL — ABNORMAL HIGH (ref 65–99)
Glucose-Capillary: 157 mg/dL — ABNORMAL HIGH (ref 65–99)
Glucose-Capillary: 225 mg/dL — ABNORMAL HIGH (ref 65–99)

## 2017-09-18 LAB — CBC WITH DIFFERENTIAL/PLATELET
BASOS PCT: 0 %
Basophils Absolute: 0 10*3/uL (ref 0.0–0.1)
Eosinophils Absolute: 0 10*3/uL (ref 0.0–0.7)
Eosinophils Relative: 0 %
HEMATOCRIT: 43 % (ref 39.0–52.0)
HEMOGLOBIN: 13.2 g/dL (ref 13.0–17.0)
LYMPHS ABS: 0.5 10*3/uL — AB (ref 0.7–4.0)
Lymphocytes Relative: 6 %
MCH: 25.1 pg — ABNORMAL LOW (ref 26.0–34.0)
MCHC: 30.7 g/dL (ref 30.0–36.0)
MCV: 81.7 fL (ref 78.0–100.0)
MONOS PCT: 1 %
Monocytes Absolute: 0.1 10*3/uL (ref 0.1–1.0)
NEUTROS ABS: 6.9 10*3/uL (ref 1.7–7.7)
NEUTROS PCT: 93 %
Platelets: 209 10*3/uL (ref 150–400)
RBC: 5.26 MIL/uL (ref 4.22–5.81)
RDW: 15.4 % (ref 11.5–15.5)
WBC: 7.4 10*3/uL (ref 4.0–10.5)

## 2017-09-18 LAB — TROPONIN I
TROPONIN I: 0.03 ng/mL — AB (ref <0.03)
Troponin I: <0.03 ng/mL (ref <0.03)
Troponin I: 0.03 ng/mL (ref <0.03)

## 2017-09-18 MED ORDER — PANTOPRAZOLE SODIUM 40 MG PO TBEC
40.0000 mg | DELAYED_RELEASE_TABLET | Freq: Every day | ORAL | Status: DC
  Administered 2017-09-18 – 2017-09-19 (×2): 40 mg via ORAL
  Filled 2017-09-18 (×2): qty 1

## 2017-09-18 MED ORDER — IPRATROPIUM BROMIDE 0.02 % IN SOLN: 0.5000 mg | Freq: Four times a day (QID) | RESPIRATORY_TRACT | Status: DC

## 2017-09-18 MED ORDER — CLONIDINE HCL 0.2 MG PO TABS
0.2000 mg | ORAL_TABLET | Freq: Once | ORAL | Status: AC
  Administered 2017-09-18: 0.2 mg via ORAL
  Filled 2017-09-18: qty 1

## 2017-09-18 MED ORDER — INSULIN ASPART 100 UNIT/ML ~~LOC~~ SOLN
10.0000 [IU] | Freq: Three times a day (TID) | SUBCUTANEOUS | Status: DC
  Administered 2017-09-18 – 2017-09-19 (×3): 10 [IU] via SUBCUTANEOUS

## 2017-09-18 MED ORDER — ASPIRIN EC 81 MG PO TBEC
81.0000 mg | DELAYED_RELEASE_TABLET | Freq: Every day | ORAL | Status: DC
  Administered 2017-09-18 – 2017-09-19 (×2): 81 mg via ORAL
  Filled 2017-09-18 (×2): qty 1

## 2017-09-18 MED ORDER — INSULIN ASPART 100 UNIT/ML ~~LOC~~ SOLN
0.0000 [IU] | Freq: Three times a day (TID) | SUBCUTANEOUS | Status: DC
  Administered 2017-09-18: 7 [IU] via SUBCUTANEOUS
  Administered 2017-09-18 (×2): 4 [IU] via SUBCUTANEOUS
  Administered 2017-09-19 (×2): 11 [IU] via SUBCUTANEOUS

## 2017-09-18 MED ORDER — ACETAMINOPHEN 325 MG PO TABS
325.0000 mg | ORAL_TABLET | Freq: Four times a day (QID) | ORAL | Status: DC | PRN
  Administered 2017-09-18 (×3): 325 mg via ORAL
  Filled 2017-09-18 (×3): qty 1

## 2017-09-18 MED ORDER — METHYLPREDNISOLONE SODIUM SUCC 40 MG IJ SOLR
40.0000 mg | Freq: Four times a day (QID) | INTRAMUSCULAR | Status: AC
  Administered 2017-09-18 – 2017-09-19 (×4): 40 mg via INTRAVENOUS
  Filled 2017-09-18 (×4): qty 1

## 2017-09-18 MED ORDER — METFORMIN HCL 500 MG PO TABS
1000.0000 mg | ORAL_TABLET | Freq: Two times a day (BID) | ORAL | Status: DC
  Administered 2017-09-18 – 2017-09-19 (×3): 1000 mg via ORAL
  Filled 2017-09-18 (×3): qty 2

## 2017-09-18 MED ORDER — ENOXAPARIN SODIUM 60 MG/0.6ML ~~LOC~~ SOLN
0.5000 mg/kg | SUBCUTANEOUS | Status: DC
  Administered 2017-09-18 – 2017-09-19 (×2): 60 mg via SUBCUTANEOUS
  Filled 2017-09-18 (×2): qty 0.6

## 2017-09-18 MED ORDER — IPRATROPIUM-ALBUTEROL 0.5-2.5 (3) MG/3ML IN SOLN
3.0000 mL | Freq: Three times a day (TID) | RESPIRATORY_TRACT | Status: DC
  Administered 2017-09-18 – 2017-09-19 (×2): 3 mL via RESPIRATORY_TRACT
  Filled 2017-09-18 (×2): qty 3

## 2017-09-18 MED ORDER — IRBESARTAN 300 MG PO TABS
300.0000 mg | ORAL_TABLET | Freq: Every day | ORAL | Status: DC
  Administered 2017-09-18 – 2017-09-19 (×2): 300 mg via ORAL
  Filled 2017-09-18 (×2): qty 1

## 2017-09-18 MED ORDER — OLMESARTAN MEDOXOMIL-HCTZ 40-12.5 MG PO TABS: 1.0000 | ORAL_TABLET | Freq: Every day | ORAL | Status: DC

## 2017-09-18 MED ORDER — ORAL CARE MOUTH RINSE
15.0000 mL | Freq: Two times a day (BID) | OROMUCOSAL | Status: DC
  Administered 2017-09-18 (×2): 15 mL via OROMUCOSAL

## 2017-09-18 MED ORDER — METHYLPREDNISOLONE SODIUM SUCC 40 MG IJ SOLR: 40.0000 mg | Freq: Four times a day (QID) | INTRAMUSCULAR | Status: DC

## 2017-09-18 MED ORDER — PREDNISONE 20 MG PO TABS
40.0000 mg | ORAL_TABLET | Freq: Every day | ORAL | Status: DC
  Administered 2017-09-19: 40 mg via ORAL
  Filled 2017-09-18: qty 2

## 2017-09-18 MED ORDER — IPRATROPIUM-ALBUTEROL 0.5-2.5 (3) MG/3ML IN SOLN
3.0000 mL | Freq: Four times a day (QID) | RESPIRATORY_TRACT | Status: DC
  Administered 2017-09-18 (×3): 3 mL via RESPIRATORY_TRACT
  Filled 2017-09-18 (×3): qty 3

## 2017-09-18 MED ORDER — ALBUTEROL SULFATE (2.5 MG/3ML) 0.083% IN NEBU: 2.5000 mg | INHALATION_SOLUTION | RESPIRATORY_TRACT | Status: DC | PRN

## 2017-09-18 MED ORDER — POTASSIUM CHLORIDE CRYS ER 20 MEQ PO TBCR
20.0000 meq | EXTENDED_RELEASE_TABLET | Freq: Two times a day (BID) | ORAL | Status: DC
  Administered 2017-09-18 – 2017-09-19 (×4): 20 meq via ORAL
  Filled 2017-09-18 (×4): qty 1

## 2017-09-18 MED ORDER — CLONIDINE HCL 0.2 MG PO TABS
0.2000 mg | ORAL_TABLET | Freq: Two times a day (BID) | ORAL | Status: DC
  Administered 2017-09-18 – 2017-09-19 (×3): 0.2 mg via ORAL
  Filled 2017-09-18 (×4): qty 1

## 2017-09-18 MED ORDER — AMOXICILLIN-POT CLAVULANATE 875-125 MG PO TABS
1.0000 | ORAL_TABLET | Freq: Two times a day (BID) | ORAL | Status: DC
  Administered 2017-09-18 – 2017-09-19 (×2): 1 via ORAL
  Filled 2017-09-18 (×3): qty 1

## 2017-09-18 MED ORDER — ALBUTEROL SULFATE (2.5 MG/3ML) 0.083% IN NEBU: 2.5000 mg | INHALATION_SOLUTION | Freq: Four times a day (QID) | RESPIRATORY_TRACT | Status: DC

## 2017-09-18 MED ORDER — HYDROCHLOROTHIAZIDE 12.5 MG PO CAPS
12.5000 mg | ORAL_CAPSULE | Freq: Every day | ORAL | Status: DC
  Administered 2017-09-18 – 2017-09-19 (×2): 12.5 mg via ORAL
  Filled 2017-09-18 (×2): qty 1

## 2017-09-18 MED ORDER — FUROSEMIDE 10 MG/ML IJ SOLN
40.0000 mg | Freq: Two times a day (BID) | INTRAMUSCULAR | Status: DC
  Administered 2017-09-18 – 2017-09-19 (×3): 40 mg via INTRAVENOUS
  Filled 2017-09-18 (×3): qty 4

## 2017-09-18 MED ORDER — INSULIN DEGLUDEC 200 UNIT/ML ~~LOC~~ SOPN: 40.0000 [IU] | PEN_INJECTOR | Freq: Every day | SUBCUTANEOUS | Status: DC

## 2017-09-18 MED ORDER — INSULIN GLARGINE 100 UNIT/ML ~~LOC~~ SOLN
40.0000 [IU] | Freq: Every day | SUBCUTANEOUS | Status: DC
  Administered 2017-09-18 – 2017-09-19 (×2): 40 [IU] via SUBCUTANEOUS
  Filled 2017-09-18 (×3): qty 0.4

## 2017-09-18 MED ORDER — PREDNISONE 20 MG PO TABS: 40.0000 mg | ORAL_TABLET | Freq: Every day | ORAL | Status: DC

## 2017-09-18 MED ORDER — DULAGLUTIDE 1.5 MG/0.5ML ~~LOC~~ SOAJ: 1.5000 mg | SUBCUTANEOUS | Status: DC

## 2017-09-18 MED ORDER — GLIPIZIDE 5 MG PO TABS
10.0000 mg | ORAL_TABLET | Freq: Two times a day (BID) | ORAL | Status: DC
  Administered 2017-09-18 – 2017-09-19 (×3): 10 mg via ORAL
  Filled 2017-09-18 (×3): qty 2

## 2017-09-18 NOTE — Progress Notes (Signed)
Patient ambulated in hallway on room air and maintained an oxygen saturation of 91%, when patient returned to room his oxygen saturation increased to mid 90s at rest on room air

## 2017-09-18 NOTE — Progress Notes (Signed)
PROGRESS NOTE    Vincent Ramos  ZOX:[id number] DOB: 1951-04-26 DOA: 09/17/2017 PCP: [name], MD   Brief Narrative:   This is a 67 year old male who presented with complaints of shortness of breath and cough.  He has a history of grade 2 diastolic dysfunction on last echocardiogram performed on 09/2015.  He has been diagnosed with acute COPD exacerbation possibly secondary to some left lobar pneumonia for which she has been started on Augmentin, steroids, and breathing treatments.  He is also noted to have a component of acute on chronic diastolic heart failure for which he has been placed on diuresis.  His respiratory situation has been improving significantly since admission.  Assessment & Plan:   Principal Problem:   COPD exacerbation ([place]) Active Problems:   Type 2 diabetes mellitus (HCC)   Hypertension   Elevated troponin   Acute on chronic diastolic CHF (congestive heart failure) ([place])   1. Acute COPD exacerbation likely secondary to left lower lobe pneumonia.  Continue Augmentin as well as steroids and breathing treatments as prescribed.  Patient has now been weaned to room air, but continues to have significant dyspnea with exertion.  Perform ambulatory O2 sat in a.m. 2. Acute on chronic diastolic CHF.  He appears to be diuresing well with his current Lasix dose and has an overall negative fluid balance.  Continue to monitor I's and O's as well as daily with an continue diuresis.  2D echocardiogram performed with results pending. 3. Type 2 diabetes.  Continue current inpatient glycemic management with good blood glucose control noted. 4. Hypertension.  Continue current medications.   DVT prophylaxis:Lovenox Code Status: Full Family Communication: Daughter at bedside Disposition Plan: Observation overnight; likely home in AM   Consultants:   None  Procedures:   None  Antimicrobials:   Augmentin 2/17->   Subjective: Patient seen and evaluated today with no  new acute complaints or concerns. No acute concerns or events noted overnight.  His dyspnea appears to have improved and he has been weaned off oxygen.  Objective: Vitals:   09/18/17 0203 09/18/17 0457 09/18/17 0546 09/18/17 0733  BP:   (!) 147/76   Pulse:   90   Resp:   20   Temp:   98.2 F (36.8 C)   TempSrc:   Oral   SpO2: 96%  96% 98%  Weight:  120.2 kg (264 lb 15.9 oz)    Height:        Intake/Output Summary (Last 24 hours) at 09/18/2017 1606 Last data filed at 09/18/2017 0930 Gross per 24 hour  Intake 1080 ml  Output 2500 ml  Net -1420 ml   Filed Weights   09/17/17 2018 09/18/17 0125 09/18/17 0457  Weight: 118.8 kg (262 lb) 120 kg (264 lb 8.8 oz) 120.2 kg (264 lb 15.9 oz)    Examination:  General exam: Appears calm and comfortable  Respiratory system: Clear to auscultation. Respiratory effort normal. Cardiovascular system: S1 & S2 heard, RRR. No JVD, murmurs, rubs, gallops or clicks. No pedal edema. Gastrointestinal system: Abdomen is nondistended, soft and nontender. No organomegaly or masses felt. Normal bowel sounds heard. Central nervous system: Alert and oriented. No focal neurological deficits. Extremities: Symmetric 5 x 5 power. Skin: No rashes, lesions or ulcers Psychiatry: Judgement and insight appear normal. Mood & affect appropriate.     Data Reviewed: I have personally reviewed following labs and imaging studies  CBC: Recent Labs  Lab 09/17/17 2125 09/18/17 0612  WBC 7.3 7.4  NEUTROABS  5.3 6.9  HGB 12.5* 13.2  HCT 40.7 43.0  MCV 81.7 81.7  PLT 190 540   Basic Metabolic Panel: Recent Labs  Lab 09/17/17 2125 09/17/17 2141 09/18/17 0612  NA 146*  --  141  K 3.5  --  4.0  CL 109  --  102  CO2 28  --  27  GLUCOSE 134*  --  327*  BUN 12  --  14  CREATININE 1.07  --  1.22  CALCIUM 8.6*  --  8.6*  MG  --  1.9  --    GFR: Estimated Creatinine Clearance: 75.9 mL/min (by C-G formula based on SCr of 1.22 mg/dL). Liver Function  Tests: Recent Labs  Lab 09/17/17 2125  AST 21  ALT 39  ALKPHOS 64  BILITOT 1.2  PROT 6.1*  ALBUMIN 3.3*   No results for input(s): LIPASE, AMYLASE in the last 168 hours. No results for input(s): AMMONIA in the last 168 hours. Coagulation Profile: No results for input(s): INR, PROTIME in the last 168 hours. Cardiac Enzymes: Recent Labs  Lab 09/17/17 2125 09/18/17 0612 09/18/17 1015  TROPONINI 0.03* 0.03* <0.03   BNP (last 3 results) No results for input(s): PROBNP in the last 8760 hours. HbA1C: No results for input(s): HGBA1C in the last 72 hours. CBG: Recent Labs  Lab 09/18/17 0810 09/18/17 1156  GLUCAP 240* 157*   Lipid Profile: No results for input(s): CHOL, HDL, LDLCALC, TRIG, CHOLHDL, LDLDIRECT in the last 72 hours. Thyroid Function Tests: No results for input(s): TSH, T4TOTAL, FREET4, T3FREE, THYROIDAB in the last 72 hours. Anemia Panel: No results for input(s): VITAMINB12, FOLATE, FERRITIN, TIBC, IRON, RETICCTPCT in the last 72 hours. Sepsis Labs: No results for input(s): PROCALCITON, LATICACIDVEN in the last 168 hours.  No results found for this or any previous visit (from the past 240 hour(s)).       Radiology Studies: Dg Chest 2 View  Result Date: 09/17/2017 CLINICAL DATA:  Chest pain short of breath EXAM: CHEST  2 VIEW COMPARISON:  CT chest 06/02/2017, radiograph 06/01/2017 FINDINGS: Small bilateral pleural effusions or thickening. Stable cardiomegaly with vascular congestion. Central airways thickening. Streaky atelectasis or infiltrate at the left base. No pneumothorax. IMPRESSION: 1. Small bilateral pleural effusions or thickening with streaky atelectasis or infiltrate at the left base 2. Cardiomegaly with vascular congestion. Electronically Signed   By: Donavan Foil M.D.   On: 09/17/2017 21:46       Scheduled Meds: . aspirin EC  81 mg Oral Daily  . cloNIDine  0.2 mg Oral BID  . [START ON 09/23/2017] Dulaglutide  1.5 mg Subcutaneous Q Sat  .  enoxaparin (LOVENOX) injection  0.5 mg/kg Subcutaneous Q24H  . furosemide  40 mg Intravenous BID  . glipiZIDE  10 mg Oral BID AC  . irbesartan  300 mg Oral Daily   And  . hydrochlorothiazide  12.5 mg Oral Daily  . insulin aspart  0-20 Units Subcutaneous TID WC  . insulin glargine  40 Units Subcutaneous Daily  . ipratropium-albuterol  3 mL Nebulization TID  . mouth rinse  15 mL Mouth Rinse BID  . metFORMIN  1,000 mg Oral BID WC  . methylPREDNISolone (SOLU-MEDROL) injection  40 mg Intravenous Q6H   Followed by  . [START ON 09/19/2017] predniSONE  40 mg Oral Daily  . pantoprazole  40 mg Oral Daily  . potassium chloride  20 mEq Oral BID   Continuous Infusions:   LOS: 0 days    Time spent: 30  minutes    Aleric Froelich Darleen Crocker, DO [facility] Pager [phone]  If 7PM-7AM, please contact night-coverage [email or web address] Password [facility] 09/18/2017, 4:06 PM

## 2017-09-18 NOTE — Care Management Obs Status (Signed)
[place] NOTIFICATION   Patient Details  Name: Vincent Ramos MRN: [id number] Date of Birth: [birth year]   Medicare Observation Status Notification Given:  Yes    Sherald Barge, RN 09/18/2017, 3:43 PM

## 2017-09-18 NOTE — Progress Notes (Signed)
Pt c/o headache not relieved by Tylenol. Dr. Baltazar Najjar paged and made aware. Pt requesting Ibuprofen. Waiting for orders/call back.

## 2017-09-18 NOTE — Progress Notes (Signed)
*  PRELIMINARY RESULTS* Echocardiogram 2D Echocardiogram has been performed.  Vincent Ramos 09/18/2017, 3:59 PM

## 2017-09-18 NOTE — Progress Notes (Signed)
Initial Nutrition Assessment  DOCUMENTATION CODES:   Morbid obesity  INTERVENTION:  Heart healthy/CHO modified diet with 1.5 liter fluid restriction   Encouraged continue Heart Healthy /Cho modified diet at home with diligent portion control and daily weights  NUTRITION DIAGNOSIS:   Limited adherence to nutrition-related recommendations related to (morbid obesity) as evidenced by per patient/family report.  GOAL:   (Prevent excess caloric intake to promote 5-10% (13-17 lb) additional wt loss over the next 90 days desireable)  MONITOR:   PO intake, Weight trends, Labs REASON FOR ASSESSMENT:   Consult Assessment of nutrition requirement/status  ASSESSMENT: The patient is an obese 67 yo male from home who presents with shortness of breath and cough. He has a hx of CHF, DM-2, HTN and COPD. He is checking his blood glucose most mornings and reports results usually < 100 mg/dl.  Follows a high fiber diet at home per pt eats high fiber cereal daily. He also mentions he has been trying to follow a "keto-diet." He has dropped weight 5% since last admission. He was weighing 270-280 lb range but now is around 265 lb. Desirable wt loss and would benefit from additional gradual wt loss at least 5-10% of current body wt.   BMP Latest Ref Rng & Units 09/18/2017 09/17/2017 06/06/2017  Glucose 65 - 99 mg/dL 327(H) 134(H) 230(H)  BUN 6 - 20 mg/dL 14 12 17   Creatinine 0.61 - 1.24 mg/dL 1.22 1.07 0.95  Sodium 135 - 145 mmol/L 141 146(H) 138  Potassium 3.5 - 5.1 mmol/L 4.0 3.5 3.7  Chloride 101 - 111 mmol/L 102 109 100(L)  CO2 22 - 32 mmol/L 27 28 31   Calcium 8.9 - 10.3 mg/dL 8.6(L) 8.6(L) 7.8(L)    Meds: lasix, insulin, duoneb, prednisone, ppi, potassium   NUTRITION - FOCUSED PHYSICAL EXAM:  No detectable muscle or fat loss. Bilateral lower extremity pitting edema.   Diet Order:  Diet heart healthy/carb modified Room service appropriate? Yes; Fluid consistency: Thin; Fluid restriction: 1500 mL  Fluid  EDUCATION NEEDS:  Education needs have been addressed Skin:  Skin Assessment: Reviewed RN Assessment  Last BM:  2/17   Height:   Ht Readings from Last 1 Encounters:  09/18/17 5\' 7"  (1.702 m)    Weight:   Wt Readings from Last 1 Encounters:  09/18/17 264 lb 15.9 oz (120.2 kg)    Ideal Body Weight:  67 kg  BMI:  Body mass index is 41.5 kg/m.  Estimated Nutritional Needs:   Kcal:  1800-2000   Protein:  115-120 gr  Fluid:  1500 ml daily   Colman Cater MS,RD,CSG,LDN Office: [phone] Pager: [phone]

## 2017-09-19 LAB — BASIC METABOLIC PANEL
Anion gap: 12 (ref 5–15)
BUN: 23 mg/dL — AB (ref 6–20)
CHLORIDE: 101 mmol/L (ref 101–111)
CO2: 26 mmol/L (ref 22–32)
Calcium: 8.8 mg/dL — ABNORMAL LOW (ref 8.9–10.3)
Creatinine, Ser: 1.27 mg/dL — ABNORMAL HIGH (ref 0.61–1.24)
GFR calc non Af Amer: 58 mL/min — ABNORMAL LOW (ref >60)
Glucose, Bld: 296 mg/dL — ABNORMAL HIGH (ref 65–99)
POTASSIUM: 4.2 mmol/L (ref 3.5–5.1)
SODIUM: 139 mmol/L (ref 135–145)

## 2017-09-19 LAB — CBC
HEMATOCRIT: 42 % (ref 39.0–52.0)
Hemoglobin: 12.8 g/dL — ABNORMAL LOW (ref 13.0–17.0)
MCH: 25.1 pg — ABNORMAL LOW (ref 26.0–34.0)
MCHC: 30.5 g/dL (ref 30.0–36.0)
MCV: 82.5 fL (ref 78.0–100.0)
Platelets: 218 10*3/uL (ref 150–400)
RBC: 5.09 MIL/uL (ref 4.22–5.81)
RDW: 15.6 % — ABNORMAL HIGH (ref 11.5–15.5)
WBC: 14.9 10*3/uL — AB (ref 4.0–10.5)

## 2017-09-19 LAB — GLUCOSE, CAPILLARY
GLUCOSE-CAPILLARY: 286 mg/dL — AB (ref 65–99)
Glucose-Capillary: 259 mg/dL — ABNORMAL HIGH (ref 65–99)

## 2017-09-19 MED ORDER — OLMESARTAN MEDOXOMIL-HCTZ 40-25 MG PO TABS: 1.0000 | ORAL_TABLET | Freq: Every day | ORAL | 3 refills | Status: DC

## 2017-09-19 MED ORDER — PREDNISONE 20 MG PO TABS: 40.0000 mg | ORAL_TABLET | Freq: Every day | ORAL | 0 refills | Status: AC

## 2017-09-19 MED ORDER — AMOXICILLIN-POT CLAVULANATE 875-125 MG PO TABS: 1.0000 | ORAL_TABLET | Freq: Two times a day (BID) | ORAL | 0 refills | Status: AC

## 2017-09-19 MED ORDER — TRAMADOL HCL 50 MG PO TABS
50.0000 mg | ORAL_TABLET | Freq: Once | ORAL | Status: AC
  Administered 2017-09-19: 50 mg via ORAL
  Filled 2017-09-19: qty 1

## 2017-09-19 MED ORDER — CLONIDINE HCL 0.2 MG PO TABS: 0.2000 mg | ORAL_TABLET | Freq: Three times a day (TID) | ORAL | 0 refills | Status: DC

## 2017-09-19 MED ORDER — IPRATROPIUM-ALBUTEROL 0.5-2.5 (3) MG/3ML IN SOLN: 3.0000 mL | Freq: Four times a day (QID) | RESPIRATORY_TRACT | Status: DC | PRN

## 2017-09-19 MED ORDER — INSULIN ASPART 100 UNIT/ML FLEXPEN: 15.0000 [IU] | PEN_INJECTOR | Freq: Three times a day (TID) | SUBCUTANEOUS | 0 refills | Status: DC

## 2017-09-19 NOTE — Progress Notes (Signed)
Patient states understanding of discharge instructions.  

## 2017-09-19 NOTE — Discharge Summary (Signed)
Physician Discharge Summary  Vincent Ramos [id number]:[id number] DOB: 07/01/1951 DOA: 09/17/2017  PCP: [name], MD  Admit date: 09/17/2017  Discharge date: 09/19/2017  Admitted From:Home  Disposition:  Home  Recommendations for Outpatient Follow-up:  1. Follow up with PCP in 1-2 weeks  Home Health:N/A  Equipment/Devices:N/A  Discharge Condition:Stable  CODE STATUS: Full  Diet recommendation: Heart Healthy/Carb modified  Brief/Interim Summary:  This is a 67 year old male who presented with complaints of shortness of breath and cough.  He has a history of grade 2 diastolic dysfunction on last echocardiogram performed on 09/2015.  He has been diagnosed with acute COPD exacerbation possibly secondary to some left lobar pneumonia for which she has been started on Augmentin, steroids, and breathing treatments.  He is also noted to have a component of acute on chronic diastolic heart failure for which he has been placed on diuresis.  His respiratory situation has been improving significantly since admission and today has been able to ambulate on room air with good O2 saturations noted.  His blood pressure is noted to be slightly elevated as well as his blood glucose for which she has been increased on his home hydrochlorothiazide and  and has been given NovoLog flex pen with meals for better control.  He remains on 40 mg prednisone for 5 more days after which point I believe much of that should be better controlled.  Will order follow-up with his primary care provider in the next 1 week to recheck blood pressure as well as blood glucose.  He will also remain on Augmentin for 4 more days to finish the full 7-day course of treatment.    Discharge Diagnoses:  Principal Problem:   COPD exacerbation ([place]) Active Problems:   Type 2 diabetes mellitus (HCC)   Hypertension   Elevated troponin   Acute on chronic diastolic CHF (congestive heart failure) ([place])   Respiratory failure ([place])   1. Acute  COPD exacerbation likely secondary to left lower lobe pneumonia.  Continue Augmentin for 4 more days.  Patient has inhalers at home as needed.  Continue prednisone for 5 more day course. No need for home O2 at this time. 2. Acute on chronic diastolic CHF.  2D echocardiogram with preserved ejection fraction noted.  He does have severe LVH and remains on ARB therapy. Euvolemic. Daily weights at home and fluid restriction discussed. 3. Type 2 diabetes.    Continue current home medications.  NovoLog FlexPen added to mealtime for another 7 days to cover steroid-induced hyperglycemia. 4. Hypertension.  Continue current medications with HCTZ increased to 25 mg and clonidine to 3 times daily. Will need follow up BP outpatient.    Discharge Instructions  Discharge Instructions    Call MD for:  difficulty breathing, headache or visual disturbances   Complete by:  As directed    Diet - low sodium heart healthy   Complete by:  As directed    Carb modified.   Increase activity slowly   Complete by:  As directed      Allergies as of 09/19/2017      Reactions   Victoza [liraglutide] Itching, Rash      Medication List    STOP taking these medications   olmesartan-hydrochlorothiazide 40-12.5 MG tablet Commonly known as:  BENICAR HCT Replaced by:  olmesartan-hydrochlorothiazide 40-25 MG tablet     TAKE these medications   acetaminophen 325 MG tablet Commonly known as:  TYLENOL Take 325 mg by mouth every 6 (six) hours as needed for  moderate pain or headache.   albuterol 108 (90 Base) MCG/ACT inhaler Commonly known as:  PROVENTIL HFA;VENTOLIN HFA Inhale 2 puffs into the lungs every 6 (six) hours as needed for wheezing or shortness of breath.   amoxicillin-clavulanate 875-125 MG tablet Commonly known as:  AUGMENTIN Take 1 tablet by mouth 2 (two) times daily for 4 days.   aspirin EC 81 MG tablet Take 81 mg by mouth daily.   cloNIDine 0.2 MG tablet Commonly known as:  CATAPRES Take 1 tablet  (0.2 mg total) by mouth 3 (three) times daily. What changed:  when to take this   glipiZIDE 10 MG tablet Commonly known as:  GLUCOTROL Take 10 mg by mouth 2 (two) times daily before a meal.   insulin aspart 100 UNIT/ML FlexPen Commonly known as:  NOVOLOG Inject 15 Units into the skin 3 (three) times daily with meals for 7 days.   ipratropium-albuterol 0.5-2.5 (3) MG/3ML Soln Commonly known as:  DUONEB Take 3 mLs by nebulization every 6 (six) hours as needed (for shortness of breath or wheezing).   metFORMIN 1000 MG tablet Commonly known as:  GLUCOPHAGE Take 1,000 mg by mouth 2 (two) times daily.   olmesartan-hydrochlorothiazide 40-25 MG tablet Commonly known as:  BENICAR HCT Take 1 tablet by mouth daily. Replaces:  olmesartan-hydrochlorothiazide 40-12.5 MG tablet   pantoprazole 40 MG tablet Commonly known as:  PROTONIX Take 1 tablet (40 mg total) daily by mouth.   predniSONE 20 MG tablet Commonly known as:  DELTASONE Take 2 tablets (40 mg total) by mouth daily for 5 days. Start taking on:  09/20/2017   TRESIBA FLEXTOUCH 200 UNIT/ML Sopn Generic drug:  Insulin Degludec Inject 40-50 Units into the skin daily.   TRULICITY 1.5 WF/0.9NA Sopn Generic drug:  Dulaglutide Inject 1.5 mg into the skin every Saturday.      Follow-up Information    [name], MD Follow up in 1 week(s).   Specialty:  Internal Medicine Contact information: [place] [address] [phone]          Allergies  Allergen Reactions  . Victoza [Liraglutide] Itching and Rash    Consultations:  None   Procedures/Studies: Dg Chest 2 View  Result Date: 09/17/2017 CLINICAL DATA:  Chest pain short of breath EXAM: CHEST  2 VIEW COMPARISON:  CT chest 06/02/2017, radiograph 06/01/2017 FINDINGS: Small bilateral pleural effusions or thickening. Stable cardiomegaly with vascular congestion. Central airways thickening. Streaky atelectasis or infiltrate at the left base. No  pneumothorax. IMPRESSION: 1. Small bilateral pleural effusions or thickening with streaky atelectasis or infiltrate at the left base 2. Cardiomegaly with vascular congestion. Electronically Signed   By: Donavan Foil M.D.   On: 09/17/2017 21:46     Subjective:   Discharge Exam: Vitals:   09/18/17 2311 09/19/17 0522  BP: (!) 187/79 (!) 162/78  Pulse: 94 77  Resp: 16 16  Temp: 98.3 F (36.8 C) 98.2 F (36.8 C)  SpO2: 96% 96%   Vitals:   09/18/17 2044 09/18/17 2311 09/19/17 0500 09/19/17 0522  BP:  (!) 187/79  (!) 162/78  Pulse: 80 94  77  Resp: 16 16  16   Temp:  98.3 F (36.8 C)  98.2 F (36.8 C)  TempSrc:  Oral  Oral  SpO2: 92% 96%  96%  Weight:   118.3 kg (260 lb 14.4 oz)   Height:        General: Pt is alert, awake, not in acute distress Cardiovascular: RRR, S1/S2 +,  no rubs, no gallops Respiratory: CTA bilaterally, no wheezing, no rhonchi Abdominal: Soft, NT, ND, bowel sounds + Extremities: no edema, no cyanosis    The results of significant diagnostics from this hospitalization (including imaging, microbiology, ancillary and laboratory) are listed below for reference.     Microbiology: No results found for this or any previous visit (from the past 240 hour(s)).   Labs: BNP (last 3 results) Recent Labs    06/01/17 0402 09/17/17 2126  BNP 154.0* 496.7*   Basic Metabolic Panel: Recent Labs  Lab 09/17/17 2125 09/17/17 2141 09/18/17 0612 09/19/17 0638  NA 146*  --  141 139  K 3.5  --  4.0 4.2  CL 109  --  102 101  CO2 28  --  27 26  GLUCOSE 134*  --  327* 296*  BUN 12  --  14 23*  CREATININE 1.07  --  1.22 1.27*  CALCIUM 8.6*  --  8.6* 8.8*  MG  --  1.9  --   --    Liver Function Tests: Recent Labs  Lab 09/17/17 2125  AST 21  ALT 39  ALKPHOS 64  BILITOT 1.2  PROT 6.1*  ALBUMIN 3.3*   No results for input(s): LIPASE, AMYLASE in the last 168 hours. No results for input(s): AMMONIA in the last 168 hours. CBC: Recent Labs  Lab  09/17/17 2125 09/18/17 0612 09/19/17 0638  WBC 7.3 7.4 14.9*  NEUTROABS 5.3 6.9  --   HGB 12.5* 13.2 12.8*  HCT 40.7 43.0 42.0  MCV 81.7 81.7 82.5  PLT 190 209 218   Cardiac Enzymes: Recent Labs  Lab 09/17/17 2125 09/18/17 0612 09/18/17 1015 09/18/17 1536  TROPONINI 0.03* 0.03* <0.03 0.03*   BNP: Invalid input(s): POCBNP CBG: Recent Labs  Lab 09/18/17 0810 09/18/17 1156 09/18/17 1712 09/18/17 2309 09/19/17 0741  GLUCAP 240* 157* 181* 225* 259*   D-Dimer No results for input(s): DDIMER in the last 72 hours. Hgb A1c No results for input(s): HGBA1C in the last 72 hours. Lipid Profile No results for input(s): CHOL, HDL, LDLCALC, TRIG, CHOLHDL, LDLDIRECT in the last 72 hours. Thyroid function studies No results for input(s): TSH, T4TOTAL, T3FREE, THYROIDAB in the last 72 hours.  Invalid input(s): FREET3 Anemia work up No results for input(s): VITAMINB12, FOLATE, FERRITIN, TIBC, IRON, RETICCTPCT in the last 72 hours. Urinalysis No results found for: COLORURINE, APPEARANCEUR, LABSPEC, [place], GLUCOSEU, HGBUR, BILIRUBINUR, KETONESUR, PROTEINUR, UROBILINOGEN, NITRITE, LEUKOCYTESUR Sepsis Labs Invalid input(s): PROCALCITONIN,  WBC,  LACTICIDVEN Microbiology No results found for this or any previous visit (from the past 240 hour(s)).   Time coordinating discharge: Over 30 minutes  SIGNED:   Rodena Goldmann, DO [facility] 09/19/2017, 9:42 AM Pager [phone]  If 7PM-7AM, please contact night-coverage [email or web address] Password [email or web address]

## 2017-09-20 DIAGNOSIS — I10 Essential (primary) hypertension: Secondary | ICD-10-CM | POA: Diagnosis not present

## 2017-09-20 DIAGNOSIS — E1129 Type 2 diabetes mellitus with other diabetic kidney complication: Secondary | ICD-10-CM | POA: Diagnosis not present

## 2017-09-20 DIAGNOSIS — E782 Mixed hyperlipidemia: Secondary | ICD-10-CM | POA: Diagnosis not present

## 2017-09-28 DIAGNOSIS — G8929 Other chronic pain: Secondary | ICD-10-CM | POA: Diagnosis not present

## 2017-09-28 DIAGNOSIS — N529 Male erectile dysfunction, unspecified: Secondary | ICD-10-CM | POA: Diagnosis not present

## 2017-09-28 DIAGNOSIS — I5033 Acute on chronic diastolic (congestive) heart failure: Secondary | ICD-10-CM | POA: Diagnosis not present

## 2017-09-28 DIAGNOSIS — R0602 Shortness of breath: Secondary | ICD-10-CM | POA: Diagnosis not present

## 2017-09-28 DIAGNOSIS — Z0000 Encounter for general adult medical examination without abnormal findings: Secondary | ICD-10-CM | POA: Diagnosis not present

## 2017-09-28 DIAGNOSIS — N400 Enlarged prostate without lower urinary tract symptoms: Secondary | ICD-10-CM | POA: Diagnosis not present

## 2017-09-28 DIAGNOSIS — J441 Chronic obstructive pulmonary disease with (acute) exacerbation: Secondary | ICD-10-CM | POA: Diagnosis not present

## 2017-09-28 DIAGNOSIS — E1129 Type 2 diabetes mellitus with other diabetic kidney complication: Secondary | ICD-10-CM | POA: Diagnosis not present

## 2017-09-28 DIAGNOSIS — R0902 Hypoxemia: Secondary | ICD-10-CM | POA: Diagnosis not present

## 2017-09-28 DIAGNOSIS — E782 Mixed hyperlipidemia: Secondary | ICD-10-CM | POA: Diagnosis not present

## 2017-09-28 DIAGNOSIS — Z713 Dietary counseling and surveillance: Secondary | ICD-10-CM | POA: Diagnosis not present

## 2017-10-06 DIAGNOSIS — J159 Unspecified bacterial pneumonia: Secondary | ICD-10-CM | POA: Diagnosis not present

## 2017-10-06 DIAGNOSIS — M4322 Fusion of spine, cervical region: Secondary | ICD-10-CM | POA: Diagnosis not present

## 2017-10-12 DIAGNOSIS — J449 Chronic obstructive pulmonary disease, unspecified: Secondary | ICD-10-CM | POA: Diagnosis not present

## 2017-10-12 DIAGNOSIS — E669 Obesity, unspecified: Secondary | ICD-10-CM | POA: Diagnosis not present

## 2017-10-12 DIAGNOSIS — N401 Enlarged prostate with lower urinary tract symptoms: Secondary | ICD-10-CM | POA: Diagnosis not present

## 2017-10-12 DIAGNOSIS — I10 Essential (primary) hypertension: Secondary | ICD-10-CM | POA: Diagnosis not present

## 2017-10-23 DIAGNOSIS — K219 Gastro-esophageal reflux disease without esophagitis: Secondary | ICD-10-CM | POA: Diagnosis not present

## 2017-10-23 DIAGNOSIS — J449 Chronic obstructive pulmonary disease, unspecified: Secondary | ICD-10-CM | POA: Diagnosis not present

## 2017-10-23 DIAGNOSIS — Z6841 Body Mass Index (BMI) 40.0 and over, adult: Secondary | ICD-10-CM | POA: Diagnosis not present

## 2017-10-23 DIAGNOSIS — G894 Chronic pain syndrome: Secondary | ICD-10-CM | POA: Diagnosis not present

## 2017-10-23 DIAGNOSIS — E1129 Type 2 diabetes mellitus with other diabetic kidney complication: Secondary | ICD-10-CM | POA: Diagnosis not present

## 2017-10-23 DIAGNOSIS — R600 Localized edema: Secondary | ICD-10-CM | POA: Diagnosis not present

## 2017-10-23 DIAGNOSIS — I5030 Unspecified diastolic (congestive) heart failure: Secondary | ICD-10-CM | POA: Diagnosis not present

## 2017-10-23 DIAGNOSIS — M545 Low back pain: Secondary | ICD-10-CM | POA: Diagnosis not present

## 2017-10-23 DIAGNOSIS — I10 Essential (primary) hypertension: Secondary | ICD-10-CM | POA: Diagnosis not present

## 2017-10-23 DIAGNOSIS — E782 Mixed hyperlipidemia: Secondary | ICD-10-CM | POA: Diagnosis not present

## 2017-10-30 ENCOUNTER — Encounter: Payer: Self-pay | Admitting: *Deleted

## 2017-10-30 ENCOUNTER — Ambulatory Visit (INDEPENDENT_AMBULATORY_CARE_PROVIDER_SITE_OTHER): Payer: PPO | Admitting: Physician Assistant

## 2017-10-30 VITALS — BP 186/90 | HR 77 | Ht 66.5 in | Wt 274.4 lb

## 2017-10-30 DIAGNOSIS — R079 Chest pain, unspecified: Secondary | ICD-10-CM | POA: Diagnosis not present

## 2017-10-30 DIAGNOSIS — I10 Essential (primary) hypertension: Secondary | ICD-10-CM

## 2017-10-30 DIAGNOSIS — I5189 Other ill-defined heart diseases: Secondary | ICD-10-CM

## 2017-10-30 DIAGNOSIS — I519 Heart disease, unspecified: Secondary | ICD-10-CM | POA: Diagnosis not present

## 2017-10-30 MED ORDER — AMLODIPINE BESYLATE 10 MG PO TABS
10.0000 mg | ORAL_TABLET | Freq: Every day | ORAL | 3 refills | Status: DC
Start: 2017-10-30 — End: 2018-01-11

## 2017-10-30 MED ORDER — FUROSEMIDE 20 MG PO TABS: 20.0000 mg | ORAL_TABLET | ORAL | 3 refills | Status: DC

## 2017-10-30 NOTE — Progress Notes (Signed)
Cardiology Office Note    Date:  10/30/2017   ID:  Vincent Ramos, DOB October 17, 1950, MRN [id number]  PCP:  Vincent Squibb, MD  Cardiologist: Vincent Rouge, MD  Chief Complaint  Patient presents with  . Chest Pain    History of Present Illness:   Vincent Ramos is a 67 y.o. male who is being seen today for the evaluation of chest pain, diastolic CHF and hypertension at the request of Vincent Squibb, MD.  Patient has history of COPD, morbid obesity, hypertension poorly controlled, DM type II with proteinuria hemoglobin A1c of 7.5 down from 8.9, hyperlipidemia not always compliant with a statin.  Labs reviewed from 10/20/17 creatinine 1.1 LDL 122 hemoglobin A1c 7.4%.  EKG reviewed from 09/18/17 showed normal sinus rhythm with poor R wave progression anteriorly unchanged from prior tracings.  Patient was hospitalized 09/2017 with COPD exacerbation and 2D echo showed normal LVEF 60-65% with severe LVH could not evaluate regional wall motion Fully.  Had grade 2 DD on echo in 2017.  Low risk nuclear stress test 2017.  Complains of pounding heart pain some tightness that last for a whole day and then goes away.  Patient has a hard time describing the symptoms.  He denies exertional symptoms.  He has chronic dyspnea on exertion.  He stops and starts medicines whenever he wants.  He thinks higher doses of amlodipine caused sexual dysfunction, he says he is on Lasix every other day but it is not listed on the notes from Dr. Juel Ramos office and he does not have it with him.  He is not sure of his dose.  He refuses to take it daily because he has things to do.  He says he often stops his medicine for a day or 2 and then restarts them.  He eats out [place] and fast food frequently.  No family history of heart disease.  Does not smoke.  Past Medical History:  Diagnosis Date  . Arthritis   . CAP (community acquired pneumonia) 07/31/2014  . Enlarged prostate   . Grade II diastolic dysfunction   . Hypertension     . Pneumonia   . Pneumonia   . Shortness of breath   . Type 2 diabetes mellitus ([place])     Past Surgical History:  Procedure Laterality Date  . CIRCUMCISION    . COLONOSCOPY WITH PROPOFOL N/A 08/08/2017   Procedure: COLONOSCOPY WITH PROPOFOL;  Surgeon: Danie Binder, MD;  [facility];  Service: Endoscopy;  Laterality: N/A;  1:00pm  . ESOPHAGEAL DILATION N/A 06/07/2017   Procedure: ESOPHAGEAL DILATION;  Surgeon: Daneil Dolin, MD;  [facility]: [facility];  Service: Endoscopy;  Laterality: N/A;  . ESOPHAGOGASTRODUODENOSCOPY (EGD) WITH PROPOFOL N/A 06/07/2017   Procedure: ESOPHAGOGASTRODUODENOSCOPY (EGD) WITH PROPOFOL;  Surgeon: Daneil Dolin, MD;  [facility];  Service: Endoscopy;  Laterality: N/A;  . HYDROCELE EXCISION  10/14/2011   Procedure: HYDROCELECTOMY ADULT;  Surgeon: Malka So, MD;  Location: [facility];  Service: Urology;  Laterality: Right;  . LACERATION REPAIR     left hand  . LUMBAR LAMINECTOMY/DECOMPRESSION MICRODISCECTOMY Right 10/07/2015   Procedure: Right Lumbar four-five ,lumbar five sacral-one Laminectomy;  Surgeon: Leeroy Cha, MD;  Location: [place] [facility];  Service: Neurosurgery;  Laterality: Right;    Current Medications: Current Meds  Medication Sig  . acetaminophen (TYLENOL) 325 MG tablet Take 325 mg by mouth every 6 (six) hours as needed for moderate pain or headache.  Marland Kitchen  albuterol (PROVENTIL HFA;VENTOLIN HFA) 108 (90 Base) MCG/ACT inhaler Inhale 2 puffs into the lungs every 6 (six) hours as needed for wheezing or shortness of breath.  Marland Kitchen amLODipine (NORVASC) 10 MG tablet Take 1 tablet (10 mg total) by mouth daily.  Marland Kitchen aspirin EC 81 MG tablet Take 81 mg by mouth daily.  . cloNIDine (CATAPRES) 0.2 MG tablet Take 1 tablet (0.2 mg total) by mouth 3 (three) times daily. (Patient taking differently: Take 0.2 mg by mouth 2 (two) times daily. )  . Insulin Degludec (TRESIBA FLEXTOUCH) 200 UNIT/ML SOPN Inject 40-50 Units into the skin daily.   Marland Kitchen  ipratropium-albuterol (DUONEB) 0.5-2.5 (3) MG/3ML SOLN Take 3 mLs by nebulization every 6 (six) hours as needed (for shortness of breath or wheezing).   . metFORMIN (GLUCOPHAGE) 1000 MG tablet Take 1,000 mg by mouth 2 (two) times daily.  Marland Kitchen olmesartan-hydrochlorothiazide (BENICAR HCT) 40-25 MG tablet Take 1 tablet by mouth daily.  . pantoprazole (PROTONIX) 40 MG tablet Take 1 tablet (40 mg total) daily by mouth.  . SPIRIVA HANDIHALER 18 MCG inhalation capsule Place 1 puff into inhaler and inhale daily.  . TRULICITY 1.5 TO/6.7TI SOPN Inject 1.5 mg into the skin every Saturday.   . [DISCONTINUED] amLODipine (NORVASC) 5 MG tablet Take 1 tablet by mouth daily.     Allergies:   Victoza [liraglutide]   Social History   Socioeconomic History  . Marital status: Married    Spouse name: Not on file  . Number of children: Not on file  . Years of education: Not on file  . Highest education level: Not on file  Occupational History  . Not on file  Social Needs  . Financial resource strain: Not on file  . Food insecurity:    Worry: Not on file    Inability: Not on file  . Transportation needs:    Medical: Not on file    Non-medical: Not on file  Tobacco Use  . Smoking status: Never Smoker  . Smokeless tobacco: Never Used  Substance and Sexual Activity  . Alcohol use: No    Alcohol/week: 0.0 oz    Frequency: Never    Comment: rare beer  . Drug use: No  . Sexual activity: Yes    Birth control/protection: None  Lifestyle  . Physical activity:    Days per week: Not on file    Minutes per session: Not on file  . Stress: Not on file  Relationships  . Social connections:    Talks on phone: Not on file    Gets together: Not on file    Attends religious service: Not on file    Active member of club or organization: Not on file    Attends meetings of clubs or organizations: Not on file    Relationship status: Not on file  Other Topics Concern  . Not on file  Social History Narrative  .  Not on file     Family History:  The patient's family history includes CAD in his unknown relative; Cancer in his unknown relative; Diabetes in his unknown relative; Hypertension in his unknown relative.   ROS:   Please see the history of present illness.    Review of Systems  Constitution: Negative.  HENT: Negative.   Cardiovascular: Positive for chest pain, dyspnea on exertion and leg swelling.  Respiratory: Negative.   Endocrine: Negative.   Hematologic/Lymphatic: Negative.   Musculoskeletal: Negative.   Gastrointestinal: Negative.   Genitourinary: Negative.   Neurological: Negative.  All other systems reviewed and are negative.   PHYSICAL EXAM:   VS:  BP (!) 186/90   Pulse 77   Ht 5' 6.5" (1.689 m)   Wt 274 lb 6.4 oz (124.5 kg)   SpO2 97% Comment: on room air  BMI 43.63 kg/m   Physical Exam  GEN: Obese, in no acute distress  Neck: no JVD, carotid bruits, or masses Cardiac:RRR; distant heart sounds no murmurs, rubs, or gallops  Respiratory: Decreased breath sounds clear to auscultation bilaterally, normal work of breathing GI: soft, nontender, nondistended, + BS Ext: +1 edema bilaterally without cyanosis, clubbing,  Good distal pulses bilaterally Neuro:  Alert and Oriented x 3,  Psych: euthymic mood, full affect  Wt Readings from Last 3 Encounters:  10/30/17 274 lb 6.4 oz (124.5 kg)  09/19/17 260 lb 14.4 oz (118.3 kg)  07/05/17 279 lb (126.6 kg)      Studies/Labs Reviewed:   EKG:  EKG is not ordered today.  EKGs reviewed from hospital see above Recent Labs: 09/17/2017: ALT 39; B Natriuretic Peptide 294.0; Magnesium 1.9 09/19/2017: BUN 23; Creatinine, Ser 1.27; Hemoglobin 12.8; Platelets 218; Potassium 4.2; Sodium 139   Lipid Panel    Component Value Date/Time   CHOL 193 01/04/2013 0804   TRIG 129 01/04/2013 0804   HDL 45 01/04/2013 0804   CHOLHDL 4.3 01/04/2013 0804   VLDL 26 01/04/2013 0804   LDLCALC 122 (H) 01/04/2013 0804    Additional studies/  records that were reviewed today include:  Echo 2/18/19Study Conclusions   - Left ventricle: The cavity size was mildly dilated. Wall   thickness was increased in a pattern of severe LVH. Systolic   function was normal. The estimated ejection fraction was in the   range of 60% to 65%.   Impressions:   - LVEF is normal Cannot evaluate regional wall motion fully as   endocardium is not well seen. Consider limited echo with Definity   to define wall motion Echo 2017------------------------------------------------------------------- Study Conclusions   - Left ventricle: The cavity size was normal. Wall thickness was   increased in a pattern of moderate LVH. Systolic function was   normal. The estimated ejection fraction was in the range of 60%   to 65%. Although no diagnostic regional wall motion abnormality   was identified, this possibility cannot be completely excluded on   the basis of this study. Features are consistent with a   pseudonormal left ventricular filling pattern, with concomitant   abnormal relaxation and increased filling pressure (grade 2   diastolic dysfunction). - Aortic valve: Mildly calcified annulus. Trileaflet; mildly   calcified leaflets. - Aortic root: The aortic root was mildly dilated. - Mitral valve: There was trivial regurgitation. - Left atrium: The atrium was at the upper limits of normal in   size. - Right atrium: Central venous pressure (est): 3 mm Hg. - Atrial septum: No defect or patent foramen ovale was identified. - Tricuspid valve: There was physiologic regurgitation. - Pulmonary arteries: Systolic pressure could not be accurately   estimated. - Pericardium, extracardiac: There was no pericardial effusion.   Impressions:   - Moderate LVH with LVEF 60-65%, no definite wall motion   abnormalities with somewhat limited images. Grade 2 diastolic   dysfunction. Upper normal left atrial chamber size. Mildly   dilated aortic root. Mildly  calcified aortic valve. Trivial   mitral and tricuspid regurgitation.    Nuclear stress test 2017No diagnostic ST segment changes by standard criteria. Low risk Duke treadmill  score of 8.  Blood pressure demonstrated a hypertensive response to exercise.  Small, mild intensity defects at the anterior apex and apical to basal inferolateral wall. These are fixed and most consistent with soft tissue attenuation.  This is a low risk study.  Nuclear stress EF: 52%.       ASSESSMENT:    1. Essential hypertension   2. Chest pain, unspecified type   3. Grade II diastolic dysfunction   4. Class 2 severe obesity due to excess calories with serious comorbidity in adult, unspecified BMI (HCC)      PLAN:  In order of problems listed above:  Chest pain patient has a hard time describing it but does have multiple cardiac risk factors for this including uncontrolled hypertension, diabetes with a hemoglobin A1c of 7.4 and hyperlipidemia.  Patient had a low risk nuclear stress test in 2017.  Will repeat.  Follow-up with Dr. Johnsie Cancel who saw him in 2017.  Hypertension uncontrolled, severe LVH on recent 2D echo 09/2017.  Discussed several possible medication changes but patient says he stops them because of decreased libido and he only takes Lasix every other day because he urinates too much.  We will try to increase amlodipine to 10 mg once daily.  2 g sodium diet reiterated.  Continue clonidine and Benicar  Grade 2 DD on echo in 2017 could not evaluate on most recent echo but severe LVH.  Patient also has lower extremity edema.  I have asked him to take Lasix daily but he is not going to do this.  Obesity weight loss essential for overall health.  Medication Adjustments/Labs and Tests Ordered: Current medicines are reviewed at length with the patient today.  Concerns regarding medicines are outlined above.  Medication changes, Labs and Tests ordered today are listed in the Patient Instructions  below. Patient Instructions  Medication Instructions:  Increase AMLODIPINE TO 10 MG DAILY  Labwork: NONE  Testing/Procedures: Your physician has requested that you have a lexiscan myoview. For further information please visit [email or web address]. Please follow instruction sheet, as given.    Follow-Up: Your physician recommends that you schedule a follow-up appointment in: DR. Johnsie Cancel PENDING TEST RESULTS    Any Other Special Instructions Will Be Listed Below (If Applicable).     If you need a refill on your cardiac medications before your next appointment, please call your pharmacy.      Signed, Vincent Barrios, PA-C  10/30/2017 1:56 PM    [facility] [place], [place], [place]  [address] Phone: [phone]; Fax: [phone]

## 2017-10-30 NOTE — Patient Instructions (Signed)
Medication Instructions:  Increase AMLODIPINE TO 10 MG DAILY  Labwork: NONE  Testing/Procedures: Your physician has requested that you have a lexiscan myoview. For further information please visit [email or web address]. Please follow instruction sheet, as given.    Follow-Up: Your physician recommends that you schedule a follow-up appointment in: DR. Johnsie Cancel PENDING TEST RESULTS    Any Other Special Instructions Will Be Listed Below (If Applicable).     If you need a refill on your cardiac medications before your next appointment, please call your pharmacy.

## 2017-11-03 ENCOUNTER — Ambulatory Visit (HOSPITAL_COMMUNITY)
Admission: RE | Admit: 2017-11-03 | Discharge: 2017-11-03 | Disposition: A | Discharge status: Home / Self Care | Payer: PPO | Source: Ambulatory Visit | Attending: Physician Assistant | Admitting: Physician Assistant

## 2017-11-03 ENCOUNTER — Encounter (HOSPITAL_COMMUNITY): Payer: Self-pay

## 2017-11-03 ENCOUNTER — Encounter (HOSPITAL_COMMUNITY)
Admission: RE | Admit: 2017-11-03 | Discharge: 2017-11-03 | Disposition: A | Discharge status: Home / Self Care | Payer: PPO | Source: Ambulatory Visit | Attending: Physician Assistant | Admitting: Physician Assistant

## 2017-11-03 DIAGNOSIS — R9439 Abnormal result of other cardiovascular function study: Secondary | ICD-10-CM | POA: Insufficient documentation

## 2017-11-03 DIAGNOSIS — R079 Chest pain, unspecified: Secondary | ICD-10-CM | POA: Insufficient documentation

## 2017-11-03 HISTORY — DX: Unspecified asthma, uncomplicated: J45.909

## 2017-11-03 LAB — NM MYOCAR MULTI W/SPECT W/WALL MOTION / EF
CHL CUP MPHR: 156 {beats}/min
CHL CUP NUCLEAR SDS: 4
CHL CUP NUCLEAR SRS: 6
CHL CUP NUCLEAR SSS: 10
CSEPED: 5 min
CSEPEW: 7 METS
CSEPHR: 87 %
CSEPPHR: 137 {beats}/min
Exercise duration (sec): 1 s
LHR: 0.43
LV dias vol: 159 mL (ref 62–150)
LV sys vol: 90 mL
NUC STRESS TID: 0.93
RPE: 17
Rest HR: 75 {beats}/min

## 2017-11-03 MED ORDER — TECHNETIUM TC 99M TETROFOSMIN IV KIT
30.0000 | PACK | Freq: Once | INTRAVENOUS | Status: AC | PRN
  Administered 2017-11-03: 32 via INTRAVENOUS

## 2017-11-03 MED ORDER — SODIUM CHLORIDE 0.9% FLUSH
INTRAVENOUS | Status: AC
  Administered 2017-11-03: 10 mL via INTRAVENOUS
  Filled 2017-11-03: qty 10

## 2017-11-03 MED ORDER — REGADENOSON 0.4 MG/5ML IV SOLN
INTRAVENOUS | Status: AC
  Filled 2017-11-03: qty 5

## 2017-11-03 MED ORDER — TECHNETIUM TC 99M TETROFOSMIN IV KIT
10.0000 | PACK | Freq: Once | INTRAVENOUS | Status: AC | PRN
  Administered 2017-11-03: 10.4 via INTRAVENOUS

## 2017-11-06 DIAGNOSIS — M4322 Fusion of spine, cervical region: Secondary | ICD-10-CM | POA: Diagnosis not present

## 2017-11-06 DIAGNOSIS — J159 Unspecified bacterial pneumonia: Secondary | ICD-10-CM | POA: Diagnosis not present

## 2017-12-19 NOTE — H&P (View-Only) (Signed)
Cardiology Office Note    Date:  12/20/2017   ID:  Vincent Ramos, DOB 05-09-1951, MRN [id number]  PCP:  [name], MD  Cardiologist: [name], MD  No chief complaint on file.   History of Present Illness:   67 y.o. seen by PA as new patient on 10/30/17. History of HTN, diastolic CHF and chest pain. Morbidly obese with COPD Type 2 DM with A1c as high as 8.9 HLD not always compliant with meds Had flare of COPD 10/20/17 TTE at that time had severe LVH with EF 60-65% Poor diet eats out a lot Lots of salt with [place] food No history of CAD and had low risk myovue in 2017  He had f/u TTE 09/18/17 EF 60-65% Myovue done 11/03/17 inferior defect partially reversible EF 44%   He has been having palpitations and chest pain Pain is atypical sharp and both left and right sided However given his risk factors PA asked patient to see me to discuss caht  He has been having more LE edema Lasix and HCTZ doses increased  I reviewed his myovue and given his obesity may represent diaphragmatic attenuation  But do favor right and left cath given symptoms and risk factors  Risks including bleeding, stroke, contrast allergy and need for emergency surgery discussed willing to proceed  Past Medical History:  Diagnosis Date  . Arthritis   . Asthma   . CAP (community acquired pneumonia) 07/31/2014  . Enlarged prostate   . Grade II diastolic dysfunction   . Hypertension   . Pneumonia   . Pneumonia   . Shortness of breath   . Type 2 diabetes mellitus ([place])     Past Surgical History:  Procedure Laterality Date  . CIRCUMCISION    . COLONOSCOPY WITH PROPOFOL N/A 08/08/2017   Procedure: COLONOSCOPY WITH PROPOFOL;  Surgeon: Danie Binder, MD;  [facility];  Service: Endoscopy;  Laterality: N/A;  1:00pm  . ESOPHAGEAL DILATION N/A 06/07/2017   Procedure: ESOPHAGEAL DILATION;  Surgeon: Daneil Dolin, MD;  [facility];  Service: Endoscopy;  Laterality: N/A;  .  ESOPHAGOGASTRODUODENOSCOPY (EGD) WITH PROPOFOL N/A 06/07/2017   Procedure: ESOPHAGOGASTRODUODENOSCOPY (EGD) WITH PROPOFOL;  Surgeon: Daneil Dolin, MD;  [facility];  Service: Endoscopy;  Laterality: N/A;  . HYDROCELE EXCISION  10/14/2011   Procedure: HYDROCELECTOMY ADULT;  Surgeon: Malka So, MD;  Location: [facility];  Service: Urology;  Laterality: Right;  . LACERATION REPAIR     left hand  . LUMBAR LAMINECTOMY/DECOMPRESSION MICRODISCECTOMY Right 10/07/2015   Procedure: Right Lumbar four-five ,lumbar five sacral-one Laminectomy;  Surgeon: Leeroy Cha, MD;  Location: [place] [facility];  Service: Neurosurgery;  Laterality: Right;    Current Medications: Current Meds  Medication Sig  . acetaminophen (TYLENOL) 325 MG tablet Take 325 mg by mouth every 6 (six) hours as needed for moderate pain or headache.  . albuterol (PROVENTIL HFA;VENTOLIN HFA) 108 (90 Base) MCG/ACT inhaler Inhale 2 puffs into the lungs every 6 (six) hours as needed for wheezing or shortness of breath.  Marland Kitchen amLODipine (NORVASC) 10 MG tablet Take 1 tablet (10 mg total) by mouth daily.  Marland Kitchen aspirin EC 81 MG tablet Take 81 mg by mouth daily.  . cloNIDine (CATAPRES) 0.2 MG tablet Take 0.2 mg by mouth daily.  . furosemide (LASIX) 20 MG tablet Take 1 tablet (20 mg total) by mouth every other day.  . Insulin Degludec (TRESIBA FLEXTOUCH) 200 UNIT/ML SOPN Inject 40-50 Units  into the skin daily.   Marland Kitchen ipratropium-albuterol (DUONEB) 0.5-2.5 (3) MG/3ML SOLN Take 3 mLs by nebulization every 6 (six) hours as needed (for shortness of breath or wheezing).   . metFORMIN (GLUCOPHAGE) 1000 MG tablet Take 1,000 mg by mouth 2 (two) times daily.  Marland Kitchen olmesartan-hydrochlorothiazide (BENICAR HCT) 40-25 MG tablet Take 1 tablet by mouth daily.  . pantoprazole (PROTONIX) 40 MG tablet Take 1 tablet (40 mg total) daily by mouth.  . SPIRIVA HANDIHALER 18 MCG inhalation capsule Place 1 puff into inhaler and inhale daily.  . TRULICITY 1.5 GU/5.4YH SOPN  Inject 1.5 mg into the skin every Saturday.   . [DISCONTINUED] cloNIDine (CATAPRES) 0.2 MG tablet Take 1 tablet (0.2 mg total) by mouth 3 (three) times daily. (Patient taking differently: Take 0.2 mg by mouth daily. )     Allergies:   Victoza [liraglutide]   Social History   Socioeconomic History  . Marital status: Married    Spouse name: Not on file  . Number of children: Not on file  . Years of education: Not on file  . Highest education level: Not on file  Occupational History  . Not on file  Social Needs  . Financial resource strain: Not on file  . Food insecurity:    Worry: Not on file    Inability: Not on file  . Transportation needs:    Medical: Not on file    Non-medical: Not on file  Tobacco Use  . Smoking status: Never Smoker  . Smokeless tobacco: Never Used  Substance and Sexual Activity  . Alcohol use: No    Alcohol/week: 0.0 oz    Frequency: Never    Comment: rare beer  . Drug use: No  . Sexual activity: Yes    Birth control/protection: None  Lifestyle  . Physical activity:    Days per week: Not on file    Minutes per session: Not on file  . Stress: Not on file  Relationships  . Social connections:    Talks on phone: Not on file    Gets together: Not on file    Attends religious service: Not on file    Active member of club or organization: Not on file    Attends meetings of clubs or organizations: Not on file    Relationship status: Not on file  Other Topics Concern  . Not on file  Social History Narrative  . Not on file     Family History:  The patient's family history includes CAD in his unknown relative; Cancer in his unknown relative; Diabetes in his unknown relative; Hypertension in his unknown relative.   ROS:   Please see the history of present illness.    Review of Systems  Constitution: Negative.  HENT: Negative.   Cardiovascular: Positive for chest pain, dyspnea on exertion and leg swelling.  Respiratory: Negative.   Endocrine:  Negative.   Hematologic/Lymphatic: Negative.   Musculoskeletal: Negative.   Gastrointestinal: Negative.   Genitourinary: Negative.   Neurological: Negative.    All other systems reviewed and are negative.   PHYSICAL EXAM:   VS:  BP (!) 160/80 (BP Location: Left Arm)   Pulse 89   Ht 5' 6.5" (1.689 m)   Wt 282 lb (127.9 kg)   SpO2 91%   BMI 44.83 kg/m   Physical Exam  Affect appropriate Obese male  HEENT: normal Neck supple with no adenopathy JVP normal no bruits no thyromegaly Lungs clear with no wheezing and good diaphragmatic motion Heart:  S1/S2 no murmur, no rub, gallop or click PMI normal Abdomen: benighn, BS positve, no tenderness, no AAA no bruit.  No HSM or HJR Distal pulses intact with no bruits Plus one bilateral  edema Neuro non-focal Skin warm and dry No muscular weakness  Wt Readings from Last 3 Encounters:  12/20/17 282 lb (127.9 kg)  10/30/17 274 lb 6.4 oz (124.5 kg)  09/19/17 260 lb 14.4 oz (118.3 kg)      Studies/Labs Reviewed:   EKG:  EKG is not ordered today.  EKGs reviewed from hospital see above Recent Labs: 09/17/2017: ALT 39; B Natriuretic Peptide 294.0; Magnesium 1.9 09/19/2017: BUN 23; Creatinine, Ser 1.27; Hemoglobin 12.8; Platelets 218; Potassium 4.2; Sodium 139   Lipid Panel    Component Value Date/Time   CHOL 193 01/04/2013 0804   TRIG 129 01/04/2013 0804   HDL 45 01/04/2013 0804   CHOLHDL 4.3 01/04/2013 0804   VLDL 26 01/04/2013 0804   LDLCALC 122 (H) 01/04/2013 0804    Additional studies/ records that were reviewed today include:  Echo 2/18/19Study Conclusions   - Left ventricle: The cavity size was mildly dilated. Wall   thickness was increased in a pattern of severe LVH. Systolic   function was normal. The estimated ejection fraction was in the   range of 60% to 65%.   Impressions:   - LVEF is normal Cannot evaluate regional wall motion fully as   endocardium is not well seen. Consider limited echo with Definity   to  define wall motion Echo 2017------------------------------------------------------------------- Study Conclusions   - Left ventricle: The cavity size was normal. Wall thickness was   increased in a pattern of moderate LVH. Systolic function was   normal. The estimated ejection fraction was in the range of 60%   to 65%. Although no diagnostic regional wall motion abnormality   was identified, this possibility cannot be completely excluded on   the basis of this study. Features are consistent with a   pseudonormal left ventricular filling pattern, with concomitant   abnormal relaxation and increased filling pressure (grade 2   diastolic dysfunction). - Aortic valve: Mildly calcified annulus. Trileaflet; mildly   calcified leaflets. - Aortic root: The aortic root was mildly dilated. - Mitral valve: There was trivial regurgitation. - Left atrium: The atrium was at the upper limits of normal in   size. - Right atrium: Central venous pressure (est): 3 mm Hg. - Atrial septum: No defect or patent foramen ovale was identified. - Tricuspid valve: There was physiologic regurgitation. - Pulmonary arteries: Systolic pressure could not be accurately   estimated. - Pericardium, extracardiac: There was no pericardial effusion.   Impressions:   - Moderate LVH with LVEF 60-65%, no definite wall motion   abnormalities with somewhat limited images. Grade 2 diastolic   dysfunction. Upper normal left atrial chamber size. Mildly   dilated aortic root. Mildly calcified aortic valve. Trivial   mitral and tricuspid regurgitation.    Nuclear stress test 2017No diagnostic ST segment changes by standard criteria. Low risk Duke treadmill score of 8.  Blood pressure demonstrated a hypertensive response to exercise.  Small, mild intensity defects at the anterior apex and apical to basal inferolateral wall. These are fixed and most consistent with soft tissue attenuation.  This is a low risk  study.  Nuclear stress EF: 52%.       ASSESSMENT:    No diagnosis found.   PLAN:  In order of problems listed above:  Chest pain : atypical  myovue done 11/03/17 abnormal with inferior defect described as partially reversible EF 44% but TTE done 09/18/17 normal EF 60-65%  Given symptoms and difficulties with non invasive testing favor right and left cath next week Lab called orders written for Dr Tamala Julian next Wendsday Hypertension uncontrolled, severe LVH on recent 2D echo 09/2017. Rx limited By patients non compliance with meds   Grade 2 DD on echo in 2017 related to HTN and obesity lasix as prescribed   Obesity weight loss essential for overall health.  DM:  Discussed low carb diet.  Target hemoglobin A1c is 6.5 or less.  Continue current medications. Hold metformin and 1/2 normal nightly insulin dose    [name]

## 2017-12-19 NOTE — Progress Notes (Signed)
Cardiology Office Note    Date:  12/20/2017   ID:  KUNAAL WALKINS, DOB [birth year], MRN [id number]  PCP:  [name], MD  Cardiologist: [name], MD  No chief complaint on file.   History of Present Illness:   67 y.o. seen by PA as new patient on 10/30/17. History of HTN, diastolic CHF and chest pain. Morbidly obese with COPD Type 2 DM with A1c as high as 8.9 HLD not always compliant with meds Had flare of COPD 10/20/17 TTE at that time had severe LVH with EF 60-65% Poor diet eats out a lot Lots of salt with [place] food No history of CAD and had low risk myovue in 2017  He had f/u TTE 09/18/17 EF 60-65% Myovue done 11/03/17 inferior defect partially reversible EF 44%   He has been having palpitations and chest pain Pain is atypical sharp and both left and right sided However given his risk factors PA asked patient to see me to discuss caht  He has been having more LE edema Lasix and HCTZ doses increased  I reviewed his myovue and given his obesity may represent diaphragmatic attenuation  But do favor right and left cath given symptoms and risk factors  Risks including bleeding, stroke, contrast allergy and need for emergency surgery discussed willing to proceed  Past Medical History:  Diagnosis Date  . Arthritis   . Asthma   . CAP (community acquired pneumonia) 07/31/2014  . Enlarged prostate   . Grade II diastolic dysfunction   . Hypertension   . Pneumonia   . Pneumonia   . Shortness of breath   . Type 2 diabetes mellitus ([place])     Past Surgical History:  Procedure Laterality Date  . CIRCUMCISION    . COLONOSCOPY WITH PROPOFOL N/A 08/08/2017   Procedure: COLONOSCOPY WITH PROPOFOL;  Surgeon: Danie Binder, MD;  [facility];  Service: Endoscopy;  Laterality: N/A;  1:00pm  . ESOPHAGEAL DILATION N/A 06/07/2017   Procedure: ESOPHAGEAL DILATION;  Surgeon: Daneil Dolin, MD;  [facility]: [facility];  Service: Endoscopy;  Laterality: N/A;  .  ESOPHAGOGASTRODUODENOSCOPY (EGD) WITH PROPOFOL N/A 06/07/2017   Procedure: ESOPHAGOGASTRODUODENOSCOPY (EGD) WITH PROPOFOL;  Surgeon: Daneil Dolin, MD;  [facility];  Service: Endoscopy;  Laterality: N/A;  . HYDROCELE EXCISION  10/14/2011   Procedure: HYDROCELECTOMY ADULT;  Surgeon: Malka So, MD;  Location: [facility];  Service: Urology;  Laterality: Right;  . LACERATION REPAIR     left hand  . LUMBAR LAMINECTOMY/DECOMPRESSION MICRODISCECTOMY Right 10/07/2015   Procedure: Right Lumbar four-five ,lumbar five sacral-one Laminectomy;  Surgeon: Leeroy Cha, MD;  Location: [facility];  Service: Neurosurgery;  Laterality: Right;    Current Medications: Current Meds  Medication Sig  . acetaminophen (TYLENOL) 325 MG tablet Take 325 mg by mouth every 6 (six) hours as needed for moderate pain or headache.  . albuterol (PROVENTIL HFA;VENTOLIN HFA) 108 (90 Base) MCG/ACT inhaler Inhale 2 puffs into the lungs every 6 (six) hours as needed for wheezing or shortness of breath.  Marland Kitchen amLODipine (NORVASC) 10 MG tablet Take 1 tablet (10 mg total) by mouth daily.  Marland Kitchen aspirin EC 81 MG tablet Take 81 mg by mouth daily.  . cloNIDine (CATAPRES) 0.2 MG tablet Take 0.2 mg by mouth daily.  . furosemide (LASIX) 20 MG tablet Take 1 tablet (20 mg total) by mouth every other day.  . Insulin Degludec (TRESIBA FLEXTOUCH) 200 UNIT/ML SOPN Inject 40-50 Units  into the skin daily.   Marland Kitchen ipratropium-albuterol (DUONEB) 0.5-2.5 (3) MG/3ML SOLN Take 3 mLs by nebulization every 6 (six) hours as needed (for shortness of breath or wheezing).   . metFORMIN (GLUCOPHAGE) 1000 MG tablet Take 1,000 mg by mouth 2 (two) times daily.  Marland Kitchen olmesartan-hydrochlorothiazide (BENICAR HCT) 40-25 MG tablet Take 1 tablet by mouth daily.  . pantoprazole (PROTONIX) 40 MG tablet Take 1 tablet (40 mg total) daily by mouth.  . SPIRIVA HANDIHALER 18 MCG inhalation capsule Place 1 puff into inhaler and inhale daily.  . TRULICITY 1.5 AS/5.0NL SOPN  Inject 1.5 mg into the skin every Saturday.   . [DISCONTINUED] cloNIDine (CATAPRES) 0.2 MG tablet Take 1 tablet (0.2 mg total) by mouth 3 (three) times daily. (Patient taking differently: Take 0.2 mg by mouth daily. )     Allergies:   Victoza [liraglutide]   Social History   Socioeconomic History  . Marital status: Married    Spouse name: Not on file  . Number of children: Not on file  . Years of education: Not on file  . Highest education level: Not on file  Occupational History  . Not on file  Social Needs  . Financial resource strain: Not on file  . Food insecurity:    Worry: Not on file    Inability: Not on file  . Transportation needs:    Medical: Not on file    Non-medical: Not on file  Tobacco Use  . Smoking status: Never Smoker  . Smokeless tobacco: Never Used  Substance and Sexual Activity  . Alcohol use: No    Alcohol/week: 0.0 oz    Frequency: Never    Comment: rare beer  . Drug use: No  . Sexual activity: Yes    Birth control/protection: None  Lifestyle  . Physical activity:    Days per week: Not on file    Minutes per session: Not on file  . Stress: Not on file  Relationships  . Social connections:    Talks on phone: Not on file    Gets together: Not on file    Attends religious service: Not on file    Active member of club or organization: Not on file    Attends meetings of clubs or organizations: Not on file    Relationship status: Not on file  Other Topics Concern  . Not on file  Social History Narrative  . Not on file     Family History:  The patient's family history includes CAD in his unknown relative; Cancer in his unknown relative; Diabetes in his unknown relative; Hypertension in his unknown relative.   ROS:   Please see the history of present illness.    Review of Systems  Constitution: Negative.  HENT: Negative.   Cardiovascular: Positive for chest pain, dyspnea on exertion and leg swelling.  Respiratory: Negative.   Endocrine:  Negative.   Hematologic/Lymphatic: Negative.   Musculoskeletal: Negative.   Gastrointestinal: Negative.   Genitourinary: Negative.   Neurological: Negative.    All other systems reviewed and are negative.   PHYSICAL EXAM:   VS:  BP (!) 160/80 (BP Location: Left Arm)   Pulse 89   Ht 5' 6.5" (1.689 m)   Wt 282 lb (127.9 kg)   SpO2 91%   BMI 44.83 kg/m   Physical Exam  Affect appropriate Obese male  HEENT: normal Neck supple with no adenopathy JVP normal no bruits no thyromegaly Lungs clear with no wheezing and good diaphragmatic motion Heart:  S1/S2 no murmur, no rub, gallop or click PMI normal Abdomen: benighn, BS positve, no tenderness, no AAA no bruit.  No HSM or HJR Distal pulses intact with no bruits Plus one bilateral  edema Neuro non-focal Skin warm and dry No muscular weakness  Wt Readings from Last 3 Encounters:  12/20/17 282 lb (127.9 kg)  10/30/17 274 lb 6.4 oz (124.5 kg)  09/19/17 260 lb 14.4 oz (118.3 kg)      Studies/Labs Reviewed:   EKG:  EKG is not ordered today.  EKGs reviewed from hospital see above Recent Labs: 09/17/2017: ALT 39; B Natriuretic Peptide 294.0; Magnesium 1.9 09/19/2017: BUN 23; Creatinine, Ser 1.27; Hemoglobin 12.8; Platelets 218; Potassium 4.2; Sodium 139   Lipid Panel    Component Value Date/Time   CHOL 193 01/04/2013 0804   TRIG 129 01/04/2013 0804   HDL 45 01/04/2013 0804   CHOLHDL 4.3 01/04/2013 0804   VLDL 26 01/04/2013 0804   LDLCALC 122 (H) 01/04/2013 0804    Additional studies/ records that were reviewed today include:  Echo 2/18/19Study Conclusions   - Left ventricle: The cavity size was mildly dilated. Wall   thickness was increased in a pattern of severe LVH. Systolic   function was normal. The estimated ejection fraction was in the   range of 60% to 65%.   Impressions:   - LVEF is normal Cannot evaluate regional wall motion fully as   endocardium is not well seen. Consider limited echo with Definity   to  define wall motion Echo 2017------------------------------------------------------------------- Study Conclusions   - Left ventricle: The cavity size was normal. Wall thickness was   increased in a pattern of moderate LVH. Systolic function was   normal. The estimated ejection fraction was in the range of 60%   to 65%. Although no diagnostic regional wall motion abnormality   was identified, this possibility cannot be completely excluded on   the basis of this study. Features are consistent with a   pseudonormal left ventricular filling pattern, with concomitant   abnormal relaxation and increased filling pressure (grade 2   diastolic dysfunction). - Aortic valve: Mildly calcified annulus. Trileaflet; mildly   calcified leaflets. - Aortic root: The aortic root was mildly dilated. - Mitral valve: There was trivial regurgitation. - Left atrium: The atrium was at the upper limits of normal in   size. - Right atrium: Central venous pressure (est): 3 mm Hg. - Atrial septum: No defect or patent foramen ovale was identified. - Tricuspid valve: There was physiologic regurgitation. - Pulmonary arteries: Systolic pressure could not be accurately   estimated. - Pericardium, extracardiac: There was no pericardial effusion.   Impressions:   - Moderate LVH with LVEF 60-65%, no definite wall motion   abnormalities with somewhat limited images. Grade 2 diastolic   dysfunction. Upper normal left atrial chamber size. Mildly   dilated aortic root. Mildly calcified aortic valve. Trivial   mitral and tricuspid regurgitation.    Nuclear stress test 2017No diagnostic ST segment changes by standard criteria. Low risk Duke treadmill score of 8.  Blood pressure demonstrated a hypertensive response to exercise.  Small, mild intensity defects at the anterior apex and apical to basal inferolateral wall. These are fixed and most consistent with soft tissue attenuation.  This is a low risk  study.  Nuclear stress EF: 52%.       ASSESSMENT:    No diagnosis found.   PLAN:  In order of problems listed above:  Chest pain : atypical  myovue done 11/03/17 abnormal with inferior defect described as partially reversible EF 44% but TTE done 09/18/17 normal EF 60-65%  Given symptoms and difficulties with non invasive testing favor right and left cath next week Lab called orders written for Dr Tamala Julian next [place] Hypertension uncontrolled, severe LVH on recent 2D echo 09/2017. Rx limited By patients non compliance with meds   Grade 2 DD on echo in 2017 related to HTN and obesity lasix as prescribed   Obesity weight loss essential for overall health.  DM:  Discussed low carb diet.  Target hemoglobin A1c is 6.5 or less.  Continue current medications. Hold metformin and 1/2 normal nightly insulin dose    [name]

## 2017-12-20 ENCOUNTER — Other Ambulatory Visit (HOSPITAL_COMMUNITY): Payer: Self-pay | Admitting: Cardiovascular Disease

## 2017-12-20 ENCOUNTER — Encounter: Payer: Self-pay | Admitting: Cardiovascular Disease

## 2017-12-20 ENCOUNTER — Other Ambulatory Visit (HOSPITAL_COMMUNITY)
Admission: RE | Admit: 2017-12-20 | Discharge: 2017-12-20 | Disposition: A | Discharge status: Home / Self Care | Payer: PPO | Source: Ambulatory Visit | Attending: Cardiovascular Disease | Admitting: Cardiovascular Disease

## 2017-12-20 ENCOUNTER — Ambulatory Visit (INDEPENDENT_AMBULATORY_CARE_PROVIDER_SITE_OTHER): Payer: PPO | Admitting: Cardiovascular Disease

## 2017-12-20 VITALS — BP 160/80 | HR 89 | Ht 66.5 in | Wt 282.0 lb

## 2017-12-20 DIAGNOSIS — Z01818 Encounter for other preprocedural examination: Secondary | ICD-10-CM | POA: Insufficient documentation

## 2017-12-20 DIAGNOSIS — R079 Chest pain, unspecified: Secondary | ICD-10-CM

## 2017-12-20 LAB — CBC WITH DIFFERENTIAL/PLATELET
Basophils Absolute: 0 10*3/uL (ref 0.0–0.1)
Basophils Relative: 1 %
EOS ABS: 0.3 10*3/uL (ref 0.0–0.7)
EOS PCT: 4 %
HCT: 42.1 % (ref 39.0–52.0)
Hemoglobin: 13.3 g/dL (ref 13.0–17.0)
LYMPHS ABS: 1.9 10*3/uL (ref 0.7–4.0)
Lymphocytes Relative: 29 %
MCH: 25.1 pg — AB (ref 26.0–34.0)
MCHC: 31.6 g/dL (ref 30.0–36.0)
MCV: 79.6 fL (ref 78.0–100.0)
MONO ABS: 0.7 10*3/uL (ref 0.1–1.0)
Monocytes Relative: 10 %
Neutro Abs: 3.7 10*3/uL (ref 1.7–7.7)
Neutrophils Relative %: 56 %
PLATELETS: 172 10*3/uL (ref 150–400)
RBC: 5.29 MIL/uL (ref 4.22–5.81)
RDW: 15.5 % (ref 11.5–15.5)
WBC: 6.6 10*3/uL (ref 4.0–10.5)

## 2017-12-20 LAB — BASIC METABOLIC PANEL
Anion gap: 10 (ref 5–15)
BUN: 14 mg/dL (ref 6–20)
CHLORIDE: 99 mmol/L — AB (ref 101–111)
CO2: 29 mmol/L (ref 22–32)
Calcium: 9.1 mg/dL (ref 8.9–10.3)
Creatinine, Ser: 1.17 mg/dL (ref 0.61–1.24)
GFR calc Af Amer: >60 mL/min (ref >60)
GFR calc non Af Amer: >60 mL/min (ref >60)
GLUCOSE: 198 mg/dL — AB (ref 65–99)
Potassium: 3.3 mmol/L — ABNORMAL LOW (ref 3.5–5.1)
SODIUM: 138 mmol/L (ref 135–145)

## 2017-12-20 NOTE — Patient Instructions (Signed)
Medication Instructions:  Your physician recommends that you continue on your current medications as directed. Please refer to the Current Medication list given to you today.    Labwork:TODAY  BMET CBC  Testing/Procedures: Your physician has requested that you have a cardiac catheterization. Cardiac catheterization is used to diagnose and/or treat various heart conditions. Doctors may recommend this procedure for a number of different reasons. The most common reason is to evaluate chest pain. Chest pain can be a symptom of coronary artery disease (CAD), and cardiac catheterization can show whether plaque is narrowing or blocking your heart's arteries. This procedure is also used to evaluate the valves, as well as measure the blood flow and oxygen levels in different parts of your heart. For further information please visit [email or web address]. Please follow instruction sheet, as given.    Follow-Up: Your physician recommends that you schedule a follow-up appointment in: 3 WEEKS    Any Other Special Instructions Will Be Listed Below (If Applicable).     If you need a refill on your cardiac medications before your next appointment, please call your pharmacy.    [place] [id number] Dept: [phone] Loc: [place]  12/20/2017  You are scheduled for a Cardiac Catheterization on Wednesday, May 29 with Dr. Daneen Schick.  1. Please arrive at the [facility] (Main Entrance A) at [facility]: [place], [place] [address] at 9:00 AM (two hours before your procedure to ensure your preparation). Free valet parking service is available.   Special note: Every effort is made to have your procedure done on time. Please understand that emergencies sometimes delay scheduled procedures.  2. Diet: Do not eat or drink anything after midnight prior to your  procedure except sips of water to take medications.  3. Labs:TODAY   4. Medication instructions in preparation for your procedure:   DO NOT TAKE METFORMIN THE MORNING OF PROCEDURE TAKE ONLY 1/2 DOSE OF INSULIN THE NIGHT BEFORE THE PROCEDURE & NONE THE MORNING OF DO NOT TAKE LASIX THE MORNING OF PROCEDURE    On the morning of your procedure, take your Aspirin and any morning medicines NOT listed above.  You may use sips of water.  5. Plan for one night stay--bring personal belongings. 6. Bring a current list of your medications and current insurance cards. 7. You MUST have a responsible person to drive you home. 8. Someone MUST be with you the first 24 hours after you arrive home or your discharge will be delayed. 9. Please wear clothes that are easy to get on and off and wear slip-on shoes.  Thank you for allowing [place] to care for you!   -- [facility]

## 2017-12-26 ENCOUNTER — Telehealth: Payer: Self-pay

## 2017-12-26 MED ORDER — POTASSIUM CHLORIDE ER 10 MEQ PO TBCR: 10.0000 meq | EXTENDED_RELEASE_TABLET | Freq: Every day | ORAL | 3 refills | Status: DC

## 2017-12-26 NOTE — Telephone Encounter (Signed)
Patient contacted pre-catheterization at [facility] scheduled for:  12/27/2017 at 71 Verified arrival time and place:  NT @ 900 Confirmed AM meds to be taken pre-cath with sip of water: Take ASA and clonidine-advised Pt to only take ASA and clonidine Take 1/2 amount Tresiba 12/26/2017  Hold:  Lasix, metformin, olmesartan-hctz  Confirmed patient has responsible person to drive home post procedure and observe patient for 24 hours:  yes Addl concerns:   K+ 3.3-advised Dr. Kyla Balzarine nurse -no documentation under lab results

## 2017-12-26 NOTE — Telephone Encounter (Addendum)
Called patient with Dr. Johnsie Cancel recommendations. Patient verbalized understanding.    ---- Message from Josue Hector, MD sent at 12/26/2017 11:40 AM EDT ----- Can start him on 10 kdur daily should be ok for cath  ----- Message ----- From: Michaelyn Barter, RN Sent: 12/26/2017  11:12 AM To: Josue Hector, MD  You saw this patient in [place] and did lab work for a heart cath that is schedule for tomorrow. His potassium was 3.3. Patient is on Lasix 20 mg daily, but is not on potassium.

## 2017-12-27 ENCOUNTER — Ambulatory Visit (HOSPITAL_COMMUNITY)
Admission: RE | Disposition: A | Discharge status: Home / Self Care | Payer: Self-pay | Source: Ambulatory Visit | Attending: Interventional Cardiology

## 2017-12-27 ENCOUNTER — Ambulatory Visit (HOSPITAL_COMMUNITY)
Admission: RE | Admit: 2017-12-27 | Discharge: 2017-12-27 | Disposition: A | Discharge status: Home / Self Care | Payer: PPO | Source: Ambulatory Visit | Attending: Interventional Cardiology | Admitting: Interventional Cardiology

## 2017-12-27 DIAGNOSIS — Z9114 Patient's other noncompliance with medication regimen: Secondary | ICD-10-CM | POA: Diagnosis not present

## 2017-12-27 DIAGNOSIS — I10 Essential (primary) hypertension: Secondary | ICD-10-CM | POA: Diagnosis present

## 2017-12-27 DIAGNOSIS — I2510 Atherosclerotic heart disease of native coronary artery without angina pectoris: Secondary | ICD-10-CM | POA: Diagnosis not present

## 2017-12-27 DIAGNOSIS — I5042 Chronic combined systolic (congestive) and diastolic (congestive) heart failure: Secondary | ICD-10-CM | POA: Diagnosis not present

## 2017-12-27 DIAGNOSIS — E119 Type 2 diabetes mellitus without complications: Secondary | ICD-10-CM

## 2017-12-27 DIAGNOSIS — Z7982 Long term (current) use of aspirin: Secondary | ICD-10-CM | POA: Insufficient documentation

## 2017-12-27 DIAGNOSIS — J441 Chronic obstructive pulmonary disease with (acute) exacerbation: Secondary | ICD-10-CM | POA: Diagnosis present

## 2017-12-27 DIAGNOSIS — R931 Abnormal findings on diagnostic imaging of heart and coronary circulation: Secondary | ICD-10-CM

## 2017-12-27 DIAGNOSIS — Z8249 Family history of ischemic heart disease and other diseases of the circulatory system: Secondary | ICD-10-CM | POA: Insufficient documentation

## 2017-12-27 DIAGNOSIS — I5033 Acute on chronic diastolic (congestive) heart failure: Secondary | ICD-10-CM | POA: Diagnosis present

## 2017-12-27 DIAGNOSIS — R0609 Other forms of dyspnea: Secondary | ICD-10-CM | POA: Diagnosis not present

## 2017-12-27 DIAGNOSIS — I110 Hypertensive heart disease with heart failure: Secondary | ICD-10-CM | POA: Insufficient documentation

## 2017-12-27 DIAGNOSIS — Z6841 Body Mass Index (BMI) 40.0 and over, adult: Secondary | ICD-10-CM | POA: Diagnosis not present

## 2017-12-27 DIAGNOSIS — Z794 Long term (current) use of insulin: Secondary | ICD-10-CM | POA: Insufficient documentation

## 2017-12-27 DIAGNOSIS — M1990 Unspecified osteoarthritis, unspecified site: Secondary | ICD-10-CM | POA: Diagnosis not present

## 2017-12-27 DIAGNOSIS — J449 Chronic obstructive pulmonary disease, unspecified: Secondary | ICD-10-CM | POA: Diagnosis not present

## 2017-12-27 DIAGNOSIS — R079 Chest pain, unspecified: Secondary | ICD-10-CM

## 2017-12-27 HISTORY — PX: RIGHT/LEFT HEART CATH AND CORONARY ANGIOGRAPHY: CATH118266

## 2017-12-27 HISTORY — DX: Abnormal findings on diagnostic imaging of heart and coronary circulation: R93.1

## 2017-12-27 LAB — BASIC METABOLIC PANEL
Anion gap: 7 (ref 5–15)
BUN: 9 mg/dL (ref 6–20)
CO2: 31 mmol/L (ref 22–32)
CREATININE: 1.12 mg/dL (ref 0.61–1.24)
Calcium: 8.8 mg/dL — ABNORMAL LOW (ref 8.9–10.3)
Chloride: 105 mmol/L (ref 101–111)
GLUCOSE: 123 mg/dL — AB (ref 65–99)
Potassium: 3.4 mmol/L — ABNORMAL LOW (ref 3.5–5.1)
Sodium: 143 mmol/L (ref 135–145)

## 2017-12-27 LAB — POCT I-STAT 3, ART BLOOD GAS (G3+)
ACID-BASE EXCESS: 2 mmol/L (ref 0.0–2.0)
BICARBONATE: 28 mmol/L (ref 20.0–28.0)
O2 Saturation: 99 %
PH ART: 7.366 (ref 7.350–7.450)
TCO2: 30 mmol/L (ref 22–32)
pCO2 arterial: 48.9 mmHg — ABNORMAL HIGH (ref 32.0–48.0)
pO2, Arterial: 121 mmHg — ABNORMAL HIGH (ref 83.0–108.0)

## 2017-12-27 LAB — POCT I-STAT 3, VENOUS BLOOD GAS (G3P V)
Acid-Base Excess: 3 mmol/L — ABNORMAL HIGH (ref 0.0–2.0)
Bicarbonate: 30.1 mmol/L — ABNORMAL HIGH (ref 20.0–28.0)
O2 Saturation: 68 %
TCO2: 32 mmol/L (ref 22–32)
pCO2, Ven: 55.4 mmHg (ref 44.0–60.0)
pH, Ven: 7.343 (ref 7.250–7.430)
pO2, Ven: 38 mmHg (ref 32.0–45.0)

## 2017-12-27 LAB — GLUCOSE, CAPILLARY
GLUCOSE-CAPILLARY: 134 mg/dL — AB (ref 65–99)
GLUCOSE-CAPILLARY: 112 mg/dL — AB (ref 65–99)

## 2017-12-27 SURGERY — RIGHT/LEFT HEART CATH AND CORONARY ANGIOGRAPHY
Anesthesia: LOCAL

## 2017-12-27 MED ORDER — HYDRALAZINE HCL 20 MG/ML IJ SOLN
INTRAMUSCULAR | Status: DC | PRN
  Administered 2017-12-27 (×2): 10 mg via INTRAVENOUS

## 2017-12-27 MED ORDER — HYDRALAZINE HCL 20 MG/ML IJ SOLN
INTRAMUSCULAR | Status: AC
  Filled 2017-12-27: qty 1

## 2017-12-27 MED ORDER — SODIUM CHLORIDE 0.9% FLUSH
3.0000 mL | INTRAVENOUS | Status: DC | PRN
Start: 2017-12-27 — End: 2017-12-27

## 2017-12-27 MED ORDER — LIDOCAINE HCL (PF) 1 % IJ SOLN
INTRAMUSCULAR | Status: DC | PRN
  Administered 2017-12-27: 20 mL

## 2017-12-27 MED ORDER — POTASSIUM CHLORIDE CRYS ER 20 MEQ PO TBCR
40.0000 meq | EXTENDED_RELEASE_TABLET | ORAL | Status: AC
Start: 2017-12-27 — End: 2017-12-27
  Administered 2017-12-27: 40 meq via ORAL
  Filled 2017-12-27: qty 2

## 2017-12-27 MED ORDER — LABETALOL HCL 5 MG/ML IV SOLN
INTRAVENOUS | Status: DC | PRN
  Administered 2017-12-27: 20 mg via INTRAVENOUS

## 2017-12-27 MED ORDER — HEPARIN (PORCINE) IN NACL 1000-0.9 UT/500ML-% IV SOLN
INTRAVENOUS | Status: AC
  Filled 2017-12-27: qty 500

## 2017-12-27 MED ORDER — SODIUM CHLORIDE 0.9% FLUSH: 3.0000 mL | INTRAVENOUS | Status: DC | PRN

## 2017-12-27 MED ORDER — SODIUM CHLORIDE 0.9% FLUSH: 3.0000 mL | Freq: Two times a day (BID) | INTRAVENOUS | Status: DC

## 2017-12-27 MED ORDER — HEPARIN (PORCINE) IN NACL 2-0.9 UNITS/ML
INTRAMUSCULAR | Status: AC | PRN
  Administered 2017-12-27 (×2): 500 mL via INTRA_ARTERIAL

## 2017-12-27 MED ORDER — LIDOCAINE HCL (PF) 1 % IJ SOLN
INTRAMUSCULAR | Status: AC
  Filled 2017-12-27: qty 30

## 2017-12-27 MED ORDER — AMLODIPINE BESYLATE 5 MG PO TABS
ORAL_TABLET | ORAL | Status: AC
  Filled 2017-12-27: qty 2

## 2017-12-27 MED ORDER — LABETALOL HCL 5 MG/ML IV SOLN
INTRAVENOUS | Status: AC
  Filled 2017-12-27: qty 4

## 2017-12-27 MED ORDER — FENTANYL CITRATE (PF) 100 MCG/2ML IJ SOLN
INTRAMUSCULAR | Status: DC | PRN
  Administered 2017-12-27: 25 ug via INTRAVENOUS

## 2017-12-27 MED ORDER — IOHEXOL 350 MG/ML SOLN
INTRAVENOUS | Status: DC | PRN
  Administered 2017-12-27: 80 mL via INTRA_ARTERIAL

## 2017-12-27 MED ORDER — POTASSIUM CHLORIDE 20 MEQ/15ML (10%) PO SOLN
ORAL | Status: AC
  Filled 2017-12-27: qty 30

## 2017-12-27 MED ORDER — SODIUM CHLORIDE 0.9 % IV SOLN: INTRAVENOUS | Status: DC

## 2017-12-27 MED ORDER — MIDAZOLAM HCL 2 MG/2ML IJ SOLN
INTRAMUSCULAR | Status: AC
  Filled 2017-12-27: qty 2

## 2017-12-27 MED ORDER — MIDAZOLAM HCL 2 MG/2ML IJ SOLN
INTRAMUSCULAR | Status: DC | PRN
  Administered 2017-12-27 (×3): 1 mg via INTRAVENOUS

## 2017-12-27 MED ORDER — FENTANYL CITRATE (PF) 100 MCG/2ML IJ SOLN
INTRAMUSCULAR | Status: AC
  Filled 2017-12-27: qty 2

## 2017-12-27 MED ORDER — AMLODIPINE BESYLATE 5 MG PO TABS
10.0000 mg | ORAL_TABLET | Freq: Every day | ORAL | Status: DC
  Administered 2017-12-27: 10 mg via ORAL

## 2017-12-27 MED ORDER — FUROSEMIDE 10 MG/ML IJ SOLN
INTRAMUSCULAR | Status: AC
  Filled 2017-12-27: qty 4

## 2017-12-27 MED ORDER — ASPIRIN 81 MG PO CHEW: 81.0000 mg | CHEWABLE_TABLET | ORAL | Status: DC

## 2017-12-27 MED ORDER — SODIUM CHLORIDE 0.9 % IV SOLN: 250.0000 mL | INTRAVENOUS | Status: DC | PRN

## 2017-12-27 MED ORDER — ONDANSETRON HCL 4 MG/2ML IJ SOLN: 4.0000 mg | Freq: Four times a day (QID) | INTRAMUSCULAR | Status: DC | PRN

## 2017-12-27 MED ORDER — SODIUM CHLORIDE 0.9 % IV SOLN
INTRAVENOUS | Status: DC
  Administered 2017-12-27: 10:00:00 via INTRAVENOUS

## 2017-12-27 MED ORDER — NITROGLYCERIN IN D5W 200-5 MCG/ML-% IV SOLN
INTRAVENOUS | Status: AC
  Filled 2017-12-27: qty 250

## 2017-12-27 MED ORDER — FUROSEMIDE 10 MG/ML IJ SOLN
20.0000 mg | Freq: Once | INTRAMUSCULAR | Status: AC
  Administered 2017-12-27: 20 mg via INTRAVENOUS

## 2017-12-27 MED ORDER — ACETAMINOPHEN 325 MG PO TABS: 650.0000 mg | ORAL_TABLET | ORAL | Status: DC | PRN

## 2017-12-27 MED ORDER — NITROGLYCERIN IN D5W 200-5 MCG/ML-% IV SOLN
INTRAVENOUS | Status: AC | PRN
  Administered 2017-12-27: 20 ug/min via INTRAVENOUS

## 2017-12-27 SURGICAL SUPPLY — 13 items
CATH INFINITI 5FR MPB2 (CATHETERS) ×1 IMPLANT
CATH SWAN GANZ 7F STRAIGHT (CATHETERS) ×1 IMPLANT
COVER PRB 48X5XTLSCP FOLD TPE (BAG) IMPLANT
COVER PROBE 5X48 (BAG) ×2
DEVICE CLOSURE MYNXGRIP 5F (Vascular Products) ×1 IMPLANT
HOVERMATT SINGLE USE (MISCELLANEOUS) ×1 IMPLANT
KIT HEART LEFT (KITS) ×2 IMPLANT
PACK CARDIAC CATHETERIZATION (CUSTOM PROCEDURE TRAY) ×2 IMPLANT
SHEATH PINNACLE 5F 10CM (SHEATH) ×1 IMPLANT
SHEATH PINNACLE 7F 10CM (SHEATH) ×1 IMPLANT
SHIELD RADPAD SCOOP 12X17 (MISCELLANEOUS) ×1 IMPLANT
TRANSDUCER W/STOPCOCK (MISCELLANEOUS) ×2 IMPLANT
WIRE EMERALD 3MM-J .035X150CM (WIRE) ×1 IMPLANT

## 2017-12-27 NOTE — Discharge Instructions (Signed)

## 2017-12-27 NOTE — Progress Notes (Signed)
Rfa/rfv site  Level 0 prior to 7 fr rfv removal. 7 fr sheath removed- manual pressure held x 12 min - with hemostasis achieved. R dp + before sheath pull and after sheath pull .A dry, sterile dressing applied to rfa/rfv site- pt tolerated well. Report to [facility] [employer]- pt. Advised if he laughs, coughs, sneezes to hold pressure - hand guided to rfa/rfv site with verbal understanding

## 2017-12-27 NOTE — Interval H&P Note (Signed)
Cath Lab Visit (complete for each Cath Lab visit)  Clinical Evaluation Leading to the Procedure:   ACS: No.  Non-ACS:    Anginal Classification: CCS II  Anti-ischemic medical therapy: Minimal Therapy (1 class of medications)  Non-Invasive Test Results: Low-risk stress test findings: cardiac mortality <1%/year  Prior CABG: No previous CABG      History and Physical Interval Note:  12/27/2017 12:06 PM  Jaeson E Iannaccone  has presented today for surgery, with the diagnosis of hf  The various methods of treatment have been discussed with the patient and family. After consideration of risks, benefits and other options for treatment, the patient has consented to  Procedure(s): RIGHT/LEFT HEART CATH AND CORONARY ANGIOGRAPHY (N/A) as a surgical intervention .  The patient's history has been reviewed, patient examined, no change in status, stable for surgery.  I have reviewed the patient's chart and labs.  Questions were answered to the patient's satisfaction.     Belva Crome III

## 2017-12-28 ENCOUNTER — Encounter (HOSPITAL_COMMUNITY): Payer: Self-pay | Admitting: Interventional Cardiology

## 2017-12-28 MED FILL — Heparin Sod (Porcine)-NaCl IV Soln 1000 Unit/500ML-0.9%: INTRAVENOUS | Qty: 1000 | Status: AC

## 2017-12-28 NOTE — Research (Signed)
[facility] Informed Consent           Subject Name:  Vincent Ramos    Subject met inclusion and exclusion criteria.  The informed consent form, study requirements and expectations were reviewed with the subject and questions and concerns were addressed prior to the signing of the consent form.  The subject verbalized understanding of the trial requirements.  The subject agreed to participate in the [facility] trial and signed the informed consent.  The informed consent was obtained prior to performance of any protocol-specific procedures for the subject.  A copy of the signed informed consent was given to the subject and a copy was placed in the subject's medical record.   [place] Chalmers, Research Assistant 12/27/2017 4:10 p.m.

## 2017-12-29 ENCOUNTER — Telehealth: Payer: Self-pay | Admitting: Student

## 2017-12-29 NOTE — Telephone Encounter (Signed)
Please call pt concerning his furosemide (LASIX) 20 MG tablet [[id number]]

## 2017-12-29 NOTE — Telephone Encounter (Signed)
Returned pt call. He was confused about his lasix dose. He was making sure he was to take 20 mg daily. Informed pt that was correct. He voiced understanding.

## 2018-01-04 ENCOUNTER — Encounter: Payer: Self-pay | Admitting: Nurse Practitioner

## 2018-01-10 ENCOUNTER — Encounter: Payer: Self-pay | Admitting: *Deleted

## 2018-01-10 NOTE — Progress Notes (Signed)
Cardiology Office Note    Date:  01/11/2018   ID:  MONTRAVIOUS WEIGELT, DOB September 28, 1950, MRN [id number]  PCP:  [name], MD  Cardiologist: [name], MD    Chief Complaint  Patient presents with  . Follow-up    s/p cardiac catheterization    History of Present Illness:    Vincent Ramos is a 67 y.o. male with past medical history of chronic diastolic CHF, HTN, HLD, Type 2 DM, and COPD who presents to the office today for follow-up from his recent cardiac catheterization.  He was last examined by Dr. Johnsie Cancel on 12/20/2017 and reported episodes of chest discomfort occurring at rest and with activity. A NST had been performed in 10/2017 and showed a partially reversible defect which was felt to be most consistent with mild peri-infarct ischemia. This was performed by Dr. Tamala Julian on 12/27/2017 and showed widely patent coronary arteries with normal left main, 30% mid LAD, luminal irregularities in the proximal and mid circumflex, and 30 to 40% stenosis in the mid RCA. EF was estimated at 40 to 45% and he was noted to have severely elevated end-diastolic pressure consistent with chronic combined systolic and diastolic heart failure. Aggressive BP control was recommended as he was severely hypertensive during the procedure.   In talking the patient today, he denies any repeat episodes of chest discomfort since his recent cardiac catheterization. He reports that the morning of his procedure, he did not take any of his medications and "felt wonderful". He has since only been taking Amlodipine intermittently due to thinking this is contributing to episodes of erectile dysfunction and overall feeling jittery when taking the medication.Says he did take the medication this morning. Has been on Olmesartan-HCTZ and Clonidine for several years and denies any side-effects to these medications.   He denies any recent dyspnea on exertion, orthopnea, PND, or palpitations. Does have chronic lower extremity edema  and had been prescribed Lasix 20 mg as needed previously and says he takes this a few times per week. He initially tells me he does not consume a high sodium diet but later says he eats at [employer] regularly.    Past Medical History:  Diagnosis Date  . Arthritis   . Asthma   . CAD (coronary artery disease)    a. 11/2017: cath showing widely patent coronary arteries with normal left main, 30% mid LAD, luminal irregularities in the proximal and mid circumflex, and 30 to 40% stenosis in the mid RCA.  Marland Kitchen CAP (community acquired pneumonia) 07/31/2014  . Enlarged prostate   . Grade II diastolic dysfunction   . Hypertension   . Pneumonia   . Pneumonia   . Shortness of breath   . Type 2 diabetes mellitus ([place])     Past Surgical History:  Procedure Laterality Date  . CIRCUMCISION    . COLONOSCOPY WITH PROPOFOL N/A 08/08/2017   Procedure: COLONOSCOPY WITH PROPOFOL;  Surgeon: Danie Binder, MD;  Location: [facility];  Service: Endoscopy;  Laterality: N/A;  1:00pm  . ESOPHAGEAL DILATION N/A 06/07/2017   Procedure: ESOPHAGEAL DILATION;  Surgeon: Daneil Dolin, MD;  Location: [facility];  Service: Endoscopy;  Laterality: N/A;  . ESOPHAGOGASTRODUODENOSCOPY (EGD) WITH PROPOFOL N/A 06/07/2017   Procedure: ESOPHAGOGASTRODUODENOSCOPY (EGD) WITH PROPOFOL;  Surgeon: Daneil Dolin, MD;  Location: [facility];  Service: Endoscopy;  Laterality: N/A;  . HYDROCELE EXCISION  10/14/2011   Procedure: HYDROCELECTOMY ADULT;  Surgeon: Malka So, MD;  Location: [facility];  Service: Urology;  Laterality: Right;  . LACERATION REPAIR     left hand  . LUMBAR LAMINECTOMY/DECOMPRESSION MICRODISCECTOMY Right 10/07/2015   Procedure: Right Lumbar four-five ,lumbar five sacral-one Laminectomy;  Surgeon: Leeroy Cha, MD;  Location: [facility];  Service: Neurosurgery;  Laterality: Right;  . RIGHT/LEFT HEART CATH AND CORONARY ANGIOGRAPHY N/A 12/27/2017   Procedure: RIGHT/LEFT HEART CATH AND CORONARY ANGIOGRAPHY;   Surgeon: Belva Crome, MD;  Location: [facility];  Service: Cardiovascular;  Laterality: N/A;    Current Medications: Outpatient Medications Prior to Visit  Medication Sig Dispense Refill  . acetaminophen (TYLENOL) 325 MG tablet Take 325 mg by mouth every 6 (six) hours as needed for moderate pain or headache.    . albuterol (PROVENTIL HFA;VENTOLIN HFA) 108 (90 Base) MCG/ACT inhaler Inhale 2 puffs into the lungs every 6 (six) hours as needed for wheezing or shortness of breath.    Marland Kitchen aspirin EC 81 MG tablet Take 81 mg by mouth daily.    . cloNIDine (CATAPRES) 0.2 MG tablet Take 0.2 mg by mouth 2 (two) times daily.     . furosemide (LASIX) 20 MG tablet Take 20 mg by mouth daily as needed.    . Insulin Degludec (TRESIBA FLEXTOUCH) 200 UNIT/ML SOPN Inject 40-50 Units into the skin daily.     Marland Kitchen SPIRIVA HANDIHALER 18 MCG inhalation capsule Place 1 puff into inhaler and inhale daily.    . TRULICITY 1.5 AT/5.5DD SOPN Inject 1.5 mg into the skin every Saturday.     Marland Kitchen amLODipine (NORVASC) 10 MG tablet Take 1 tablet (10 mg total) by mouth daily. 90 tablet 3  . olmesartan-hydrochlorothiazide (BENICAR HCT) 40-12.5 MG tablet Take 1 tablet by mouth daily.    . potassium chloride (K-DUR) 10 MEQ tablet Take 1 tablet (10 mEq total) by mouth daily. 90 tablet 3  . ipratropium-albuterol (DUONEB) 0.5-2.5 (3) MG/3ML SOLN Take 3 mLs by nebulization every 6 (six) hours as needed (for shortness of breath or wheezing).     . Omega-3 Fatty Acids (FISH OIL PO) Take 1 capsule by mouth every other day.    . pantoprazole (PROTONIX) 40 MG tablet Take 1 tablet (40 mg total) daily by mouth. 30 tablet 0  . furosemide (LASIX) 20 MG tablet Take 1 tablet (20 mg total) by mouth every other day. (Patient taking differently: Take 20 mg by mouth daily. ) 45 tablet 3  . metFORMIN (GLUCOPHAGE) 1000 MG tablet Take 1,000 mg by mouth 2 (two) times daily.     No facility-administered medications prior to visit.      Allergies:    Amlodipine; Novolog [insulin aspart]; and Victoza [liraglutide]   Social History   Socioeconomic History  . Marital status: Married    Spouse name: Not on file  . Number of children: Not on file  . Years of education: Not on file  . Highest education level: Not on file  Occupational History  . Not on file  Social Needs  . Financial resource strain: Not on file  . Food insecurity:    Worry: Not on file    Inability: Not on file  . Transportation needs:    Medical: Not on file    Non-medical: Not on file  Tobacco Use  . Smoking status: Never Smoker  . Smokeless tobacco: Never Used  Substance and Sexual Activity  . Alcohol use: No    Alcohol/week: 0.0 oz    Frequency: Never    Comment: rare beer  .  Drug use: No  . Sexual activity: Yes    Birth control/protection: None  Lifestyle  . Physical activity:    Days per week: Not on file    Minutes per session: Not on file  . Stress: Not on file  Relationships  . Social connections:    Talks on phone: Not on file    Gets together: Not on file    Attends religious service: Not on file    Active member of club or organization: Not on file    Attends meetings of clubs or organizations: Not on file    Relationship status: Not on file  Other Topics Concern  . Not on file  Social History Narrative  . Not on file     Family History:  The patient's family history includes CAD in his unknown relative; Cancer in his unknown relative; Diabetes in his unknown relative; Hypertension in his unknown relative.   Review of Systems:   Please see the history of present illness.     General:  No chills, fever, night sweats or weight changes.  Cardiovascular:  No chest pain, dyspnea on exertion, orthopnea, palpitations, paroxysmal nocturnal dyspnea. Positive for edema.  Dermatological: No rash, lesions/masses Respiratory: No cough, dyspnea Urologic: No hematuria, dysuria Abdominal:   No nausea, vomiting, diarrhea, bright red blood per  rectum, melena, or hematemesis Neurologic:  No visual changes, wkns, changes in mental status. All other systems reviewed and are otherwise negative except as noted above.   Physical Exam:    VS:  BP (!) 168/86   Pulse 75   Ht 5\' 6"  (1.676 m)   Wt 282 lb (127.9 kg)   SpO2 97%   BMI 45.52 kg/m    General: Well developed, obese African American male appearing in no acute distress. Head: Normocephalic, atraumatic, sclera non-icteric, no xanthomas, nares are without discharge.  Neck: No carotid bruits. JVD not elevated.  Lungs: Respirations regular and unlabored, without wheezes or rales.  Heart: Regular rate and rhythm. No S3 or S4.  No murmur, no rubs, or gallops appreciated. Abdomen: Soft, non-tender, non-distended with normoactive bowel sounds. No hepatomegaly. No rebound/guarding. No obvious abdominal masses. Msk:  Strength and tone appear normal for age. No joint deformities or effusions. Extremities: No clubbing or cyanosis. 1+ pitting edema bilaterally.  Distal pedal pulses are 2+ bilaterally. Neuro: Alert and oriented X 3. Moves all extremities spontaneously. No focal deficits noted. Psych:  Responds to questions appropriately with a normal affect. Skin: No rashes or lesions noted  Wt Readings from Last 3 Encounters:  01/11/18 282 lb (127.9 kg)  12/27/17 270 lb (122.5 kg)  12/20/17 282 lb (127.9 kg)     Studies/Labs Reviewed:   EKG:  EKG is not ordered today.    Recent Labs: 09/17/2017: ALT 39; B Natriuretic Peptide 294.0; Magnesium 1.9 12/20/2017: Hemoglobin 13.3; Platelets 172 12/27/2017: BUN 9; Creatinine, Ser 1.12; Potassium 3.4; Sodium 143   Lipid Panel    Component Value Date/Time   CHOL 193 01/04/2013 0804   TRIG 129 01/04/2013 0804   HDL 45 01/04/2013 0804   CHOLHDL 4.3 01/04/2013 0804   VLDL 26 01/04/2013 0804   LDLCALC 122 (H) 01/04/2013 0804    Additional studies/ records that were reviewed today include:   Echocardiogram: 09/2017 Study  Conclusions - Left ventricle: The cavity size was mildly dilated. Wall   thickness was increased in a pattern of severe LVH. Systolic   function was normal. The estimated ejection fraction was in the  range of 60% to 65%.  Impressions: - LVEF is normal Cannot evaluate regional wall motion fully as   endocardium is not well seen. Consider limited echo with Definity   to define wall motion  Cardiac Catheterization: 12/27/2017  Widely patent coronary arteries with normal left main, 30% mid LAD, luminal irregularities in the proximal and mid circumflex, and 30 to 40% stenosis in the mid RCA.  Mild global left ventricular systolic dysfunction with EF 40 to 45%.  Severely elevated end-diastolic pressure consistent with chronic combined systolic and diastolic heart failure.  Severe poorly controlled blood pressure during the procedure.  Severe BP elevation during the procedure requiring IV hydralazine, labetalol, and IV nitroglycerin drip.  Stable right femoral after Mynx arteriotomy closure device.  RECOMMENDATIONS:   IV Lasix x1 and more aggressive outpatient diuresis  Guideline directed therapy for systolic/diastolic heart failure.  Assessment:    1. Chronic diastolic CHF (congestive heart failure) ([place])   2. Hypertensive heart disease without heart failure   3. Accelerated hypertension   4. Coronary artery disease involving native coronary artery of native heart without angina pectoris   5. Hyperlipidemia LDL goal <70      Plan:   In order of problems listed above:  1. Chronic Diastolic CHF/ Hypertensive Heart Disease - EF was noted to be reduced to 40 to 45% by his recent cardiac catheterization but was preserved at 60 to 65% by echocardiogram in 09/2017. - He denies any recent dyspnea on exertion, orthopnea or PND but does have lower extremity edema on examination. In reviewing his medication list, he is listed as being on Olmesartan-HCTZ along with Lasix.  Unclear as  to why he is on dual diuretic therapy at this time. In the setting of his edema and elevated BP, will further titrate HCTZ to 25 mg daily and reduce Lasix to as needed dosing. If he develops worsening edema, would need to stop HCTZ and start Lasix as a regularly scheduled medication. - continue ARB. Will add Hydralazine as outlined below. Unable to add BB therapy as he experienced ED with this in the past by his report.  - Management is difficult in the setting of his medication noncompliance and multiple side effects to the medications. Also reviewed sodium and fluid restriction.   2. Accelerated HTN - BP is significantly elevated at 178/80 on initial check, slightly improved to 168/86 on recheck. He is currently on Amlodipine 10mg  daily, Clonidine 0.2mg  BID, and Olmesartan-HCTZ 40-12.5mg  daily.  He only takes Amlodipine intermittently due to ED and overall feeling jittery.  - will plan to further titrate Olmesartan-HCTZ to 40-25mg  daily. He has repeat labs with his PCP in two weeks and will request a copy of his blood work at that time. As he is not taking Amlodipine regularly due to reported side effects, will stop this and switch to Hydralazine 25mg  BID. Will arrange for a BP check in 2 weeks. If BP remains elevated as I would anticipate, would further titrate to 50mg  BID. Intolerant to BB therapy in the past due to ED issues as well. Could consider the use of Bystolic in the future as this has a lower side effect profile.   3. CAD - recent catheterization showed patent coronary arteries with normal left main, 30% mid LAD, luminal irregularities in the proximal and mid circumflex, and 30 to 40% stenosis in the mid RCA.  - he denies any recurrent episodes of chest discomfort.  - continue ASA and Fish Oil.   4. HLD -  Followed by PCP. FLP in 07/2017 showed total cholesterol of 162, HDL 39, LDL 108, and Triglycerides 76. Previously intolerant to statin therapy and remains on Fish Oil at this time.     Medication Adjustments/Labs and Tests Ordered: Current medicines are reviewed at length with the patient today.  Concerns regarding medicines are outlined above.  Medication changes, Labs and Tests ordered today are listed in the Patient Instructions below. Patient Instructions  Medication Instructions:   STOP AMLODIPINE  INCREASE BENICAR TO 40/25 MG ONCE DAILY  START HYDRALAZINE 25 MG TWICE DAILY  Follow-Up:  Your physician recommends that you schedule a follow-up appointment in: NURSE VISIT FOR BP CHECK IN 2 WEEKS  Your physician recommends that you schedule a follow-up appointment in: [place]    If you need a refill on your cardiac medications before your next appointment, please call your pharmacy.     Signed, Erma Heritage, PA-C  01/11/2018 4:42 PM    Vincent S. [address] [place], [place] [address] Phone: [phone]

## 2018-01-11 ENCOUNTER — Ambulatory Visit: Payer: PPO | Admitting: Student

## 2018-01-11 ENCOUNTER — Encounter: Payer: Self-pay | Admitting: Student

## 2018-01-11 VITALS — BP 168/86 | HR 75 | Ht 66.0 in | Wt 282.0 lb

## 2018-01-11 DIAGNOSIS — I5032 Chronic diastolic (congestive) heart failure: Secondary | ICD-10-CM | POA: Diagnosis not present

## 2018-01-11 DIAGNOSIS — I2510 Atherosclerotic heart disease of native coronary artery without angina pectoris: Secondary | ICD-10-CM | POA: Diagnosis not present

## 2018-01-11 DIAGNOSIS — I119 Hypertensive heart disease without heart failure: Secondary | ICD-10-CM | POA: Diagnosis not present

## 2018-01-11 DIAGNOSIS — I10 Essential (primary) hypertension: Secondary | ICD-10-CM

## 2018-01-11 DIAGNOSIS — E785 Hyperlipidemia, unspecified: Secondary | ICD-10-CM

## 2018-01-11 MED ORDER — HYDRALAZINE HCL 25 MG PO TABS: 25.0000 mg | ORAL_TABLET | Freq: Two times a day (BID) | ORAL | 3 refills | Status: DC

## 2018-01-11 MED ORDER — POTASSIUM CHLORIDE ER 10 MEQ PO TBCR: EXTENDED_RELEASE_TABLET | ORAL | 3 refills | Status: DC

## 2018-01-11 MED ORDER — OLMESARTAN MEDOXOMIL-HCTZ 40-25 MG PO TABS: 1.0000 | ORAL_TABLET | Freq: Every day | ORAL | 3 refills | Status: DC

## 2018-01-11 NOTE — Patient Instructions (Signed)
Medication Instructions:   STOP AMLODIPINE  INCREASE BENICAR TO 40/25 MG ONCE DAILY  START HYDRALAZINE 25 MG TWICE DAILY  Follow-Up:  Your physician recommends that you schedule a follow-up appointment in: [place] BP CHECK IN 2 WEEKS  Your physician recommends that you schedule a follow-up appointment in: [place]    If you need a refill on your cardiac medications before your next appointment, please call your pharmacy.

## 2018-01-22 DIAGNOSIS — E119 Type 2 diabetes mellitus without complications: Secondary | ICD-10-CM | POA: Diagnosis not present

## 2018-01-22 DIAGNOSIS — E1165 Type 2 diabetes mellitus with hyperglycemia: Secondary | ICD-10-CM | POA: Diagnosis not present

## 2018-01-22 DIAGNOSIS — E782 Mixed hyperlipidemia: Secondary | ICD-10-CM | POA: Diagnosis not present

## 2018-01-24 DIAGNOSIS — G8929 Other chronic pain: Secondary | ICD-10-CM | POA: Diagnosis not present

## 2018-01-24 DIAGNOSIS — R600 Localized edema: Secondary | ICD-10-CM | POA: Diagnosis not present

## 2018-01-24 DIAGNOSIS — R809 Proteinuria, unspecified: Secondary | ICD-10-CM | POA: Diagnosis not present

## 2018-01-24 DIAGNOSIS — I5033 Acute on chronic diastolic (congestive) heart failure: Secondary | ICD-10-CM | POA: Diagnosis not present

## 2018-01-24 DIAGNOSIS — E1169 Type 2 diabetes mellitus with other specified complication: Secondary | ICD-10-CM | POA: Diagnosis not present

## 2018-01-24 DIAGNOSIS — K219 Gastro-esophageal reflux disease without esophagitis: Secondary | ICD-10-CM | POA: Diagnosis not present

## 2018-01-24 DIAGNOSIS — M545 Low back pain: Secondary | ICD-10-CM | POA: Diagnosis not present

## 2018-01-24 DIAGNOSIS — N400 Enlarged prostate without lower urinary tract symptoms: Secondary | ICD-10-CM | POA: Diagnosis not present

## 2018-01-24 DIAGNOSIS — E782 Mixed hyperlipidemia: Secondary | ICD-10-CM | POA: Diagnosis not present

## 2018-01-24 DIAGNOSIS — J441 Chronic obstructive pulmonary disease with (acute) exacerbation: Secondary | ICD-10-CM | POA: Diagnosis not present

## 2018-01-24 DIAGNOSIS — I10 Essential (primary) hypertension: Secondary | ICD-10-CM | POA: Diagnosis not present

## 2018-01-25 ENCOUNTER — Ambulatory Visit (INDEPENDENT_AMBULATORY_CARE_PROVIDER_SITE_OTHER): Payer: PPO | Admitting: *Deleted

## 2018-01-25 VITALS — BP 176/92 | HR 80 | Ht 66.5 in | Wt 282.4 lb

## 2018-01-25 DIAGNOSIS — I10 Essential (primary) hypertension: Secondary | ICD-10-CM

## 2018-01-25 MED ORDER — HYDRALAZINE HCL 50 MG PO TABS: 50.0000 mg | ORAL_TABLET | Freq: Two times a day (BID) | ORAL | 3 refills | Status: DC

## 2018-01-25 NOTE — Progress Notes (Signed)
Patient in office for BP check. C/o lower extremity swelling. No other complaints at this time. Encouraged pt to elevate legs while sitting and wear over the counter compression hose. Pt voiced understanding.

## 2018-01-25 NOTE — Patient Instructions (Signed)
Medication Instructions:  Your physician has recommended you make the following change in your medication:  Increase Hydralazine to 50 mg Two Times Daily    Labwork: NONE   Testing/Procedures: NONE   Follow-Up: Your physician recommends that you schedule a follow-up appointment in: 2 Weeks with Bernerd Pho, PA-C   Any Other Special Instructions Will Be Listed Below (If Applicable).     If you need a refill on your cardiac medications before your next appointment, please call your pharmacy. Thank you for choosing [facility]!

## 2018-02-03 ENCOUNTER — Emergency Department (HOSPITAL_COMMUNITY): Payer: PPO

## 2018-02-03 ENCOUNTER — Other Ambulatory Visit: Payer: Self-pay

## 2018-02-03 ENCOUNTER — Encounter (HOSPITAL_COMMUNITY): Payer: Self-pay | Admitting: Emergency Medicine

## 2018-02-03 ENCOUNTER — Emergency Department (HOSPITAL_COMMUNITY)
Admission: EM | Admit: 2018-02-03 | Discharge: 2018-02-03 | Disposition: A | Discharge status: Home / Self Care | Payer: PPO | Attending: Emergency Medicine | Admitting: Emergency Medicine

## 2018-02-03 DIAGNOSIS — R51 Headache: Secondary | ICD-10-CM | POA: Diagnosis not present

## 2018-02-03 DIAGNOSIS — E119 Type 2 diabetes mellitus without complications: Secondary | ICD-10-CM | POA: Diagnosis not present

## 2018-02-03 DIAGNOSIS — Z79899 Other long term (current) drug therapy: Secondary | ICD-10-CM | POA: Diagnosis not present

## 2018-02-03 DIAGNOSIS — I2510 Atherosclerotic heart disease of native coronary artery without angina pectoris: Secondary | ICD-10-CM | POA: Insufficient documentation

## 2018-02-03 DIAGNOSIS — J45909 Unspecified asthma, uncomplicated: Secondary | ICD-10-CM | POA: Insufficient documentation

## 2018-02-03 DIAGNOSIS — M7989 Other specified soft tissue disorders: Secondary | ICD-10-CM | POA: Diagnosis not present

## 2018-02-03 DIAGNOSIS — I10 Essential (primary) hypertension: Secondary | ICD-10-CM | POA: Diagnosis not present

## 2018-02-03 DIAGNOSIS — I110 Hypertensive heart disease with heart failure: Secondary | ICD-10-CM | POA: Insufficient documentation

## 2018-02-03 DIAGNOSIS — Z7982 Long term (current) use of aspirin: Secondary | ICD-10-CM | POA: Insufficient documentation

## 2018-02-03 DIAGNOSIS — I5032 Chronic diastolic (congestive) heart failure: Secondary | ICD-10-CM | POA: Insufficient documentation

## 2018-02-03 DIAGNOSIS — R079 Chest pain, unspecified: Secondary | ICD-10-CM | POA: Diagnosis not present

## 2018-02-03 DIAGNOSIS — R0789 Other chest pain: Secondary | ICD-10-CM | POA: Diagnosis not present

## 2018-02-03 LAB — CBC
HCT: 42.4 % (ref 39.0–52.0)
HEMOGLOBIN: 13.5 g/dL (ref 13.0–17.0)
MCH: 25.9 pg — AB (ref 26.0–34.0)
MCHC: 31.8 g/dL (ref 30.0–36.0)
MCV: 81.4 fL (ref 78.0–100.0)
Platelets: 169 10*3/uL (ref 150–400)
RBC: 5.21 MIL/uL (ref 4.22–5.81)
RDW: 15.3 % (ref 11.5–15.5)
WBC: 5.3 10*3/uL (ref 4.0–10.5)

## 2018-02-03 LAB — TROPONIN I
Troponin I: <0.03 ng/mL (ref <0.03)
Troponin I: <0.03 ng/mL (ref <0.03)

## 2018-02-03 LAB — BRAIN NATRIURETIC PEPTIDE: B Natriuretic Peptide: 88 pg/mL (ref 0.0–100.0)

## 2018-02-03 LAB — BASIC METABOLIC PANEL
Anion gap: 7 (ref 5–15)
BUN: 12 mg/dL (ref 8–23)
CALCIUM: 8.8 mg/dL — AB (ref 8.9–10.3)
CO2: 31 mmol/L (ref 22–32)
CREATININE: 1.17 mg/dL (ref 0.61–1.24)
Chloride: 100 mmol/L (ref 98–111)
GFR calc Af Amer: >60 mL/min (ref >60)
GFR calc non Af Amer: >60 mL/min (ref >60)
GLUCOSE: 242 mg/dL — AB (ref 70–99)
Potassium: 3.6 mmol/L (ref 3.5–5.1)
Sodium: 138 mmol/L (ref 135–145)

## 2018-02-03 MED ORDER — ACETAMINOPHEN 325 MG PO TABS
650.0000 mg | ORAL_TABLET | Freq: Once | ORAL | Status: AC
  Administered 2018-02-03: 650 mg via ORAL
  Filled 2018-02-03: qty 2

## 2018-02-03 MED ORDER — HYDRALAZINE HCL 50 MG PO TABS: 50.0000 mg | ORAL_TABLET | Freq: Three times a day (TID) | ORAL | 3 refills | Status: DC

## 2018-02-03 NOTE — ED Notes (Signed)
PT requesting tylenol for c/o headache. EDP made aware.

## 2018-02-03 NOTE — ED Provider Notes (Signed)
[facility] EMERGENCY DEPARTMENT Provider Note   CSN: [id number] Arrival date & time: 02/03/18  3762     History   Chief Complaint Chief Complaint  Patient presents with  . Hypertension  . Chest Pain    HPI Vincent Ramos is a 67 y.o. male.  HPI Patient presents to the emergency room for evaluation of high blood pressure.  Patient states he has been having persistently high blood pressure with readings above 200 for the last several days.  He is also had some intermittent chest discomfort.  He says that last may be 30 minutes at a time.  At some mild discomfort in the center of his chest.  He denies any trouble with any radiation of the pain.  No diaphoresis.  Patient states he saw his doctors in the last week or 2 for his high blood pressure.  They adjusted his medications but it has not helped.  This morning his blood pressure was elevated over 200.  He took his medications but came into the emergency room for evaluation because of his persistent hypertension. Past Medical History:  Diagnosis Date  . Arthritis   . Asthma   . CAD (coronary artery disease)    a. 11/2017: cath showing widely patent coronary arteries with normal left main, 30% mid LAD, luminal irregularities in the proximal and mid circumflex, and 30 to 40% stenosis in the mid RCA.  Marland Kitchen CAP (community acquired pneumonia) 07/31/2014  . Enlarged prostate   . Grade II diastolic dysfunction   . Hypertension   . Pneumonia   . Pneumonia   . Shortness of breath   . Type 2 diabetes mellitus [facility])     Patient Active Problem List   Diagnosis Date Noted  . Abnormal nuclear cardiac imaging test 12/27/2017  . Respiratory failure ([place]) 09/18/2017  . COPD exacerbation ([place]) 09/17/2017  . Elevated troponin 09/17/2017  . Grade II diastolic dysfunction 83/15/1761  . Acute on chronic diastolic CHF (congestive heart failure) ([place]) 09/17/2017  . Polyp of rectum   . Encounter for screening colonoscopy 07/09/2017  . Aspiration  pneumonia ([place]) 06/06/2017  . Dysphagia   . Acute respiratory failure with hypoxia ([place]) 06/01/2017  . PNA (pneumonia) 05/30/2017  . Lumbar stenosis 10/07/2015  . Type 2 diabetes mellitus ([place]) 07/31/2014  . Hypertension 07/31/2014  . Obesity 07/31/2014  . Community acquired pneumonia 07/31/2014    Past Surgical History:  Procedure Laterality Date  . CIRCUMCISION    . COLONOSCOPY WITH PROPOFOL N/A 08/08/2017   Procedure: COLONOSCOPY WITH PROPOFOL;  Surgeon: Danie Binder, MD;  [facility];  Service: Endoscopy;  Laterality: N/A;  1:00pm  . ESOPHAGEAL DILATION N/A 06/07/2017   Procedure: ESOPHAGEAL DILATION;  Surgeon: Daneil Dolin, MD;  Location: [facility] ENDO SUITE;  Service: Endoscopy;  Laterality: N/A;  . ESOPHAGOGASTRODUODENOSCOPY (EGD) WITH PROPOFOL N/A 06/07/2017   Procedure: ESOPHAGOGASTRODUODENOSCOPY (EGD) WITH PROPOFOL;  Surgeon: Daneil Dolin, MD;  Location: [facility] ENDO SUITE;  Service: Endoscopy;  Laterality: N/A;  . HYDROCELE EXCISION  10/14/2011   Procedure: HYDROCELECTOMY ADULT;  Surgeon: Malka So, MD;  Location: [facility] ORS;  Service: Urology;  Laterality: Right;  . LACERATION REPAIR     left hand  . LUMBAR LAMINECTOMY/DECOMPRESSION MICRODISCECTOMY Right 10/07/2015   Procedure: Right Lumbar four-five ,lumbar five sacral-one Laminectomy;  Surgeon: Leeroy Cha, MD;  Location: [place] NEURO ORS;  Service: Neurosurgery;  Laterality: Right;  . RIGHT/LEFT HEART CATH AND CORONARY ANGIOGRAPHY N/A 12/27/2017   Procedure: RIGHT/LEFT HEART  CATH AND CORONARY ANGIOGRAPHY;  Surgeon: Belva Crome, MD;  Location: [place] CV LAB;  Service: Cardiovascular;  Laterality: N/A;        Home Medications    Prior to Admission medications   Medication Sig Start Date End Date Taking? Authorizing Provider  acetaminophen (TYLENOL) 325 MG tablet Take 325 mg by mouth every 6 (six) hours as needed for moderate pain or headache.   Yes [provider]  albuterol (PROVENTIL HFA;VENTOLIN  HFA) 108 (90 Base) MCG/ACT inhaler Inhale 2 puffs into the lungs every 6 (six) hours as needed for wheezing or shortness of breath.   Yes [provider]  aspirin EC 81 MG tablet Take 81 mg by mouth daily.   Yes [provider]  atorvastatin (LIPITOR) 80 MG tablet Take 80 mg by mouth daily.   Yes [provider]  cloNIDine (CATAPRES) 0.2 MG tablet Take 0.2 mg by mouth 2 (two) times daily.    Yes [provider]  furosemide (LASIX) 20 MG tablet Take 20 mg by mouth daily as needed.   Yes [provider]  Insulin Degludec (TRESIBA FLEXTOUCH) 200 UNIT/ML SOPN Inject 40-50 Units into the skin daily.    Yes [provider]  ipratropium-albuterol (DUONEB) 0.5-2.5 (3) MG/3ML SOLN Take 3 mLs by nebulization every 6 (six) hours as needed (for shortness of breath or wheezing).  06/08/17  Yes [provider]  olmesartan-hydrochlorothiazide (BENICAR HCT) 40-25 MG tablet Take 1 tablet by mouth daily. 01/11/18  Yes Strader, [place] M, PA-C  Omega-3 Fatty Acids (FISH OIL PO) Take 1 capsule by mouth every other day.   Yes [provider]  pantoprazole (PROTONIX) 40 MG tablet Take 40 mg by mouth daily.   Yes [provider]  potassium chloride (K-DUR) 10 MEQ tablet Take with furosemide 01/11/18  Yes Strader, [place], PA-C  SPIRIVA HANDIHALER 18 MCG inhalation capsule Place 1 puff into inhaler and inhale daily. 10/12/17  Yes [provider]  TRULICITY 1.5 RW/4.3XV SOPN Inject 1.5 mg into the skin every Saturday.  04/12/17  Yes [provider]  hydrALAZINE (APRESOLINE) 50 MG tablet Take 1 tablet (50 mg total) by mouth 3 (three) times daily. 02/03/18 08/02/18  Dorie Rank, MD    Family History Family History  Problem Relation Age of Onset  . Diabetes Unknown   . Hypertension Unknown   . CAD Unknown   . Cancer Unknown   . Anesthesia problems Neg Hx   . Hypotension Neg Hx   . Malignant hyperthermia Neg Hx   . Pseudochol  deficiency Neg Hx   . Colon cancer Neg Hx   . Colon polyps Neg Hx     Social History Social History   Tobacco Use  . Smoking status: Never Smoker  . Smokeless tobacco: Never Used  Substance Use Topics  . Alcohol use: No    Alcohol/week: 0.0 oz    Frequency: Never    Comment: rare beer  . Drug use: No     Allergies   Amlodipine; Novolog [insulin aspart]; and Victoza [liraglutide]   Review of Systems Review of Systems  Constitutional: Negative for fever.  Respiratory: Negative for shortness of breath.   Cardiovascular: Negative for chest pain.  Musculoskeletal:       Swelling in his legs  Skin: Negative for rash.  All other systems reviewed and are negative.    Physical Exam Updated Vital Signs BP (!) 156/76   Pulse (!) 57   Temp 98 F (  36.7 C)   Resp 12   Ht 1.676 m (5\' 6" )   Wt 127.9 kg (282 lb)   SpO2 97%   BMI 45.52 kg/m   Physical Exam  Constitutional: He appears well-developed and well-nourished. No distress.  HENT:  Head: Normocephalic and atraumatic.  Right Ear: External ear normal.  Left Ear: External ear normal.  Eyes: Conjunctivae are normal. Right eye exhibits no discharge. Left eye exhibits no discharge. No scleral icterus.  Neck: Neck supple. No tracheal deviation present.  Cardiovascular: Normal rate, regular rhythm and intact distal pulses.  Pulmonary/Chest: Effort normal and breath sounds normal. No stridor. No respiratory distress. He has no wheezes. He has no rales.  Abdominal: Soft. Bowel sounds are normal. He exhibits no distension. There is no tenderness. There is no rebound and no guarding.  Musculoskeletal: He exhibits no tenderness.       Right lower leg: He exhibits edema. He exhibits no tenderness.       Left lower leg: He exhibits edema. He exhibits no tenderness.  Neurological: He is alert. He has normal strength. No cranial nerve deficit (no facial droop, extraocular movements intact, no slurred speech) or sensory deficit. He  exhibits normal muscle tone. He displays no seizure activity. Coordination normal.  Skin: Skin is warm and dry. No rash noted.  Psychiatric: He has a normal mood and affect.  Nursing note and vitals reviewed.    ED Treatments / Results  Labs (all labs ordered are listed, but only abnormal results are displayed) Labs Reviewed  BASIC METABOLIC PANEL - Abnormal; Notable for the following components:      Result Value   Glucose, Bld 242 (*)    Calcium 8.8 (*)    All other components within normal limits  CBC - Abnormal; Notable for the following components:   MCH 25.9 (*)    All other components within normal limits  TROPONIN I  BRAIN NATRIURETIC PEPTIDE  TROPONIN I    EKG EKG Interpretation  Date/Time:  Saturday February 03 2018 09:57:38 EDT Ventricular Rate:  67 PR Interval:    QRS Duration: 96 QT Interval:  424 QTC Calculation: 448 R Axis:   27 Text Interpretation:  Sinus rhythm Anteroseptal infarct, old Baseline wander in lead(s) V5 No significant change since last tracing Confirmed by Dorie Rank [phone]) on 02/03/2018 10:03:13 AM   Radiology Dg Chest 2 View  Result Date: 02/03/2018 CLINICAL DATA:  Hypertension and chest pain EXAM: CHEST - 2 VIEW COMPARISON:  September 17, 2017 FINDINGS: There is stable elevation of the right hemidiaphragm. There is no edema or consolidation. Heart is borderline enlarged with pulmonary vascularity within normal limits. No adenopathy. No bone lesions. IMPRESSION: Borderline cardiac enlargement. No frank edema or consolidation. Stable elevation right hemidiaphragm. Electronically Signed   By: Lowella Grip III M.D.   On: 02/03/2018 10:54    Procedures Procedures (including critical care time)  Medications Ordered in ED Medications  acetaminophen (TYLENOL) tablet 650 mg (has no administration in time range)     Initial Impression / Assessment and Plan / ED Course  I have reviewed the triage vital signs and the nursing notes.  Pertinent  labs & imaging results that were available during my care of the patient were reviewed by me and considered in my medical decision making (see chart for details).  Clinical Course as of Feb 03 1402  Sat Feb 03, 2018  1345 Patient's blood pressure has continued to improve in the emergency room without additional medications.   [  DU]  2025 His most recent blood pressure is in the 427C systolic.   [WC]  3762 Laboratory tests are normal including delta troponin   [JK]    Clinical Course User Index [JK] Dorie Rank, MD    Patient presented to the emergency room with complaints of hypertension associated with chest discomfort.  Patient has a history of poorly controlled hypertension.  He was evaluated for chest pain earlier this year and had a catheterization that showed coronary artery disease but no evidence of any significant obstruction requiring intervention.  Patient symptoms were felt most likely to be related to his hypertension.  Patient has recently had his medications changed.  Here in the ED his blood pressure improved from 831 systolic back down to 517 systolic.  This was after taking his home medications in the morning.  According to the medical records, it seems like the patient's had some issues with compliance.  He states he has been taking his medications regularly this time.  I will have him increase his hydralazine that was recently started and  follow-up with his cardiologist and or pcp  this week.  Final Clinical Impressions(s) / ED Diagnoses   Final diagnoses:  Hypertension, unspecified type    ED Discharge Orders        Ordered    hydrALAZINE (APRESOLINE) 50 MG tablet  3 times daily     02/03/18 1351       Dorie Rank, MD 02/03/18 1404

## 2018-02-03 NOTE — ED Triage Notes (Signed)
Pt reports he has been having BP readings at home systolic 548 for 4 days. He also states CP.

## 2018-02-03 NOTE — Discharge Instructions (Signed)
Increase your hydralazine to 50 mg 3 times daily, follow-up with your cardiologist to check on your blood pressure

## 2018-02-05 ENCOUNTER — Ambulatory Visit: Payer: Self-pay | Admitting: Nurse Practitioner

## 2018-02-06 ENCOUNTER — Ambulatory Visit: Payer: PPO | Admitting: Nurse Practitioner

## 2018-02-06 ENCOUNTER — Encounter: Payer: Self-pay | Admitting: Nurse Practitioner

## 2018-02-06 VITALS — BP 212/90 | HR 84 | Temp 98.3°F | Ht 66.5 in | Wt 279.0 lb

## 2018-02-06 DIAGNOSIS — R1310 Dysphagia, unspecified: Secondary | ICD-10-CM

## 2018-02-06 DIAGNOSIS — I10 Essential (primary) hypertension: Secondary | ICD-10-CM | POA: Diagnosis not present

## 2018-02-06 DIAGNOSIS — R1319 Other dysphagia: Secondary | ICD-10-CM

## 2018-02-06 DIAGNOSIS — K5900 Constipation, unspecified: Secondary | ICD-10-CM | POA: Diagnosis not present

## 2018-02-06 HISTORY — DX: Constipation, unspecified: K59.00

## 2018-02-06 NOTE — Progress Notes (Addendum)
Referring Provider: [name], MD Primary Care Physician:  [name], MD Primary GI:  Dr. Oneida Alar  Chief Complaint  Patient presents with  . Dysphagia    HPI:   Vincent Ramos is a 67 y.o. male who presents for post procedure follow-up.  Patient was last seen in our office 07/05/2017 for dysphasia and to schedule screening colonoscopy.  Hospitalized in October 2018 for pneumonia.  There is concern of an aspiration event and the patient reported globus sensation and concern for dysmotility for which a BPE was ordered.  This showed diffuse age-related esophageal dysmotility, age-appropriate.  EGD was performed during admission and was entirely normal and status post dilation.  Dysphasia was a lot better at his last visit.  No further swallowing concerns.  Is taking pantoprazole at that time. He was due at that time for first ever colonoscopy.   Colonoscopy was completed 08/08/2017 which found redundant left colon, a single 8 mm polyp, external and internal hemorrhoids.  Surgical pathology found the polyp to be mucosal prolapse polyp.  Recommended weight loss, six-month follow-up, high-fiber diet, repeat colonoscopy in 10 years.  Recent seen in the emergency department for hypertension.  They increase his hydralazine to 3 times a day.  He admittedly missed his 12:00 dose today.  Subsequently, his blood pressure is quite elevated at 212/90.  He denies any chest pain, dyspnea, dizziness, lightheadedness, headache.  He is due to see cardiology in 2 days.  Today he states he's doing well overall. He biggest concern is constipation. Having daily bowel movement but stools are hard, straining, incomplete emptying. Denies hematochezia, melena, abdominal pain, N/V, fever, chills, unintentional weight loss. Denies chest pain, dyspnea, dizziness, lightheadedness, syncope, near syncope. Denies any other upper or lower GI symptoms.  He again states he was seen in the ER and they increased the dose and  frequency of his Hydralazine to 3 times a day. He did not take an afternoon dose. Denies any cardiac symptoms (as per above) and agrees he's seeing Cardiology on Thursday (2 days from today).  Past Medical History:  Diagnosis Date  . Arthritis   . Asthma   . CAD (coronary artery disease)    a. 11/2017: cath showing widely patent coronary arteries with normal left main, 30% mid LAD, luminal irregularities in the proximal and mid circumflex, and 30 to 40% stenosis in the mid RCA.  Marland Kitchen CAP (community acquired pneumonia) 07/31/2014  . Enlarged prostate   . Grade II diastolic dysfunction   . Hypertension   . Pneumonia   . Pneumonia   . Shortness of breath   . Type 2 diabetes mellitus ([place])     Past Surgical History:  Procedure Laterality Date  . CIRCUMCISION    . COLONOSCOPY WITH PROPOFOL N/A 08/08/2017   Procedure: COLONOSCOPY WITH PROPOFOL;  Surgeon: Danie Binder, MD;  [facility];  Service: Endoscopy;  Laterality: N/A;  1:00pm  . ESOPHAGEAL DILATION N/A 06/07/2017   Procedure: ESOPHAGEAL DILATION;  Surgeon: Daneil Dolin, MD;  [facility];  Service: Endoscopy;  Laterality: N/A;  . ESOPHAGOGASTRODUODENOSCOPY (EGD) WITH PROPOFOL N/A 06/07/2017   Procedure: ESOPHAGOGASTRODUODENOSCOPY (EGD) WITH PROPOFOL;  Surgeon: Daneil Dolin, MD;  [facility];  Service: Endoscopy;  Laterality: N/A;  . HYDROCELE EXCISION  10/14/2011   Procedure: HYDROCELECTOMY ADULT;  Surgeon: Malka So, MD;  Location: [facility] ORS;  Service: Urology;  Laterality: Right;  . LACERATION REPAIR     left  hand  . LUMBAR LAMINECTOMY/DECOMPRESSION MICRODISCECTOMY Right 10/07/2015   Procedure: Right Lumbar four-five ,lumbar five sacral-one Laminectomy;  Surgeon: Leeroy Cha, MD;  Location: [place] NEURO ORS;  Service: Neurosurgery;  Laterality: Right;  . RIGHT/LEFT HEART CATH AND CORONARY ANGIOGRAPHY N/A 12/27/2017   Procedure: RIGHT/LEFT HEART CATH AND CORONARY ANGIOGRAPHY;  Surgeon: Belva Crome, MD;  Location: [place] CV LAB;  Service: Cardiovascular;  Laterality: N/A;    Current Outpatient Medications  Medication Sig Dispense Refill  . acetaminophen (TYLENOL) 325 MG tablet Take 325 mg by mouth every 6 (six) hours as needed for moderate pain or headache.    . albuterol (PROVENTIL HFA;VENTOLIN HFA) 108 (90 Base) MCG/ACT inhaler Inhale 2 puffs into the lungs every 6 (six) hours as needed for wheezing or shortness of breath.    Marland Kitchen aspirin EC 81 MG tablet Take 81 mg by mouth daily.    Marland Kitchen atorvastatin (LIPITOR) 80 MG tablet Take 80 mg by mouth daily.    . cloNIDine (CATAPRES) 0.2 MG tablet Take 0.2 mg by mouth 2 (two) times daily.     . furosemide (LASIX) 20 MG tablet Take 20 mg by mouth daily as needed.    . hydrALAZINE (APRESOLINE) 50 MG tablet Take 1 tablet (50 mg total) by mouth 3 (three) times daily. 270 tablet 3  . Insulin Degludec (TRESIBA FLEXTOUCH) 200 UNIT/ML SOPN Inject 40-50 Units into the skin daily.     Marland Kitchen ipratropium-albuterol (DUONEB) 0.5-2.5 (3) MG/3ML SOLN Take 3 mLs by nebulization every 6 (six) hours as needed (for shortness of breath or wheezing).     Marland Kitchen olmesartan-hydrochlorothiazide (BENICAR HCT) 40-25 MG tablet Take 1 tablet by mouth daily. 90 tablet 3  . Omega-3 Fatty Acids (FISH OIL PO) Take 1 capsule by mouth every other day.    . pantoprazole (PROTONIX) 40 MG tablet Take 40 mg by mouth daily.    . potassium chloride (K-DUR) 10 MEQ tablet Take with furosemide 90 tablet 3  . SPIRIVA HANDIHALER 18 MCG inhalation capsule Place 1 puff into inhaler and inhale daily.    . TRULICITY 1.5 SW/1.0XN SOPN Inject 1.5 mg into the skin every Saturday.      No current facility-administered medications for this visit.     Allergies as of 02/06/2018 - Review Complete 02/06/2018  Allergen Reaction Noted  . Amlodipine  01/11/2018  . Novolog [insulin aspart] Hives, Itching, and Rash 12/20/2017  . Victoza [liraglutide] Itching and Rash 05/30/2017    Family History  Problem  Relation Age of Onset  . Diabetes Unknown   . Hypertension Unknown   . CAD Unknown   . Cancer Unknown   . Anesthesia problems Neg Hx   . Hypotension Neg Hx   . Malignant hyperthermia Neg Hx   . Pseudochol deficiency Neg Hx   . Colon cancer Neg Hx   . Colon polyps Neg Hx     Social History   Socioeconomic History  . Marital status: Married    Spouse name: Not on file  . Number of children: Not on file  . Years of education: Not on file  . Highest education level: Not on file  Occupational History  . Not on file  Social Needs  . Financial resource strain: Not on file  . Food insecurity:    Worry: Not on file    Inability: Not on file  . Transportation needs:    Medical: Not on file    Non-medical: Not on file  Tobacco Use  .  Smoking status: Never Smoker  . Smokeless tobacco: Never Used  Substance and Sexual Activity  . Alcohol use: No    Alcohol/week: 0.0 oz    Frequency: Never    Comment: rare beer  . Drug use: No  . Sexual activity: Yes    Birth control/protection: None  Lifestyle  . Physical activity:    Days per week: Not on file    Minutes per session: Not on file  . Stress: Not on file  Relationships  . Social connections:    Talks on phone: Not on file    Gets together: Not on file    Attends religious service: Not on file    Active member of club or organization: Not on file    Attends meetings of clubs or organizations: Not on file    Relationship status: Not on file  Other Topics Concern  . Not on file  Social History Narrative  . Not on file    Review of Systems: Complete ROS negative except as per HPI.   Physical Exam: BP (!) 212/90   Pulse 84   Temp 98.3 F (36.8 C) (Oral)   Ht 5' 6.5" (1.689 m)   Wt 279 lb (126.6 kg)   BMI 44.36 kg/m  General:   Morbidly obese male. Alert and oriented. Pleasant and cooperative. Well-nourished and well-developed.  Eyes:  Without icterus, sclera clear and conjunctiva pink.  Ears:  Normal auditory  acuity. Cardiovascular:  S1, S2 present without murmurs appreciated. Extremities without clubbing or edema. Respiratory:  Clear to auscultation bilaterally. No wheezes, rales, or rhonchi. No distress.  Gastrointestinal:  +BS, soft, non-tender and non-distended. No HSM noted. No guarding or rebound. No masses appreciated.  Rectal:  Deferred  Musculoskalatal:  Symmetrical without gross deformities. Neurologic:  Alert and oriented x4;  grossly normal neurologically. Psych:  Alert and cooperative. Normal mood and affect. Heme/Lymph/Immune: No excessive bruising noted.  **Repeat BP at end of visit was 184/88**    02/06/2018 3:57 PM   Disclaimer: This note was dictated with voice recognition software. Similar sounding words can inadvertently be transcribed and may not be corrected upon review.

## 2018-02-06 NOTE — Assessment & Plan Note (Signed)
Symptoms resolved.  Continue to monitor follow-up in 1 year.

## 2018-02-06 NOTE — Patient Instructions (Signed)
1. Take your blood pressure medicine as soon as you can. 2. As we discussed, if you develop chest pain, very short of breath, dizziness, lightheadedness, passing out then proceed to the emergency room. 3. Keep your cardiology appointment in 2 days. 4. Continue taking your other medications. 5. I am giving you samples of Linzess 72 mcg.  Take this once a day on an empty stomach. 6. Call us in 1 to 2 weeks and let us know if the Linzess is helping you have better bowel movements. 7. Return for follow-up in 1 year. 8. Call us if you have any questions or concerns.  At [facility] we value your feedback. You may receive a survey about your visit today. Please share your experience as we strive to create trusting relationships with our patients to provide genuine, compassionate, quality care.  It was great to meet you today!  I hope you have a great summer!!

## 2018-02-06 NOTE — Assessment & Plan Note (Signed)
The patient notes constipation.  He is requesting something for his constipation.  He has hard stools, straining.  Denies hematochezia.  At this point I will trial him on Linzess 72 mcg daily.  We will provide samples and request progress report 1 to 2 weeks.  Follow-up in 1 year

## 2018-02-06 NOTE — Assessment & Plan Note (Addendum)
Significant hypertension today.  He denies any neuro or cardiac symptoms.  He was recently seen in the emergency department for hypertension and his hydralazine was increased both in dose from 25 mg to 50 mg and and frequency from 2 times a day to 3 times a day.  He did not take his afternoon dose today.  I recommended he take this ASAP.  ER precautions given.  He sees cardiology in 2 days.  Of note his blood pressure was rechecked at the end of his visit and found to be 184/88 which is more reassuring.

## 2018-02-07 NOTE — Progress Notes (Signed)
Cardiology Office Note    Date:  02/08/2018   ID:  Vincent Ramos, DOB 1950-08-30, MRN [id number]  PCP:  [name], MD  Cardiologist: [name], MD    Chief Complaint  Patient presents with  . Follow-up    4 week visit; continued issues with elevated BP    History of Present Illness:    Vincent Ramos is a 67 y.o. male with past medical history of CAD (nonobstructive CAD by cath in 12/2017), chronic diastolic CHF, HTN, HLD, Type 2 DM, and COPD who presents to the office today for 4-week follow-up.  He was last examined by myself on 01/11/2018 and reported only taking amlodipine intermittently due to episodes of erectile dysfunction. BP was elevated to 178/80 at the time of his visit, slightly improved to 168/86 when rechecked.  He was listed as being on both olmesartan-HCTZ and Lasix. Therefore, Lasix was changed to as needed dosing and Olmesartan-HCTZ was titrated to 40-25 mg daily. Amlodipine was also discontinued due to his noncompliance with the medication and he was started on Hydralazine 25 mg twice daily.  He presented for a BP check on 01/25/2018 and this remained elevated at 176/92. Therefore, Hydralazine was further titrated to 50 mg twice daily.  He presented to [facility] ED on 02/03/2018 for persistently elevated blood pressure readings above 200 for the past several days with intermittent chest discomfort.  SBP was initially in the 190's and improved down to 156 after he took his home medications. It appears that Hydralazine was increased to 50 mg 3 times daily at that time.  In talking with the patient today, he reports he has been checking his blood pressure at home over the past few days but typically checks this prior to taking his morning medications and SBP has been in the 190's to 200's. Has missed doses of Hydralazine at times. He reports having a headache at the time BP is significantly elevated but denies any vision changes or weakness. No recent dyspnea on  exertion or exertional chest pain. Reports he did have discomfort along his sternum last night while consuming dinner but symptoms spontaneously resolved when he turned from side-to-side. No recent orthopnea, PND, or palpitations. Does have chronic lower extremity edema which has been stable by his report.  Reports his erectile dysfunction has resolved since stopping Amlodipine.   Past Medical History:  Diagnosis Date  . Arthritis   . Asthma   . CAD (coronary artery disease)    a. 11/2017: cath showing widely patent coronary arteries with normal left main, 30% mid LAD, luminal irregularities in the proximal and mid circumflex, and 30 to 40% stenosis in the mid RCA.  Marland Kitchen CAP (community acquired pneumonia) 07/31/2014  . Enlarged prostate   . Grade II diastolic dysfunction   . Hypertension   . Pneumonia   . Pneumonia   . Shortness of breath   . Type 2 diabetes mellitus ([name]     Past Surgical History:  Procedure Laterality Date  . CIRCUMCISION    . COLONOSCOPY WITH PROPOFOL N/A 08/08/2017   Procedure: COLONOSCOPY WITH PROPOFOL;  Surgeon: Danie Binder, MD;  Location: [facility];  Service: Endoscopy;  Laterality: N/A;  1:00pm  . ESOPHAGEAL DILATION N/A 06/07/2017   Procedure: ESOPHAGEAL DILATION;  Surgeon: Daneil Dolin, MD;  Location: [facility];  Service: Endoscopy;  Laterality: N/A;  . ESOPHAGOGASTRODUODENOSCOPY (EGD) WITH PROPOFOL N/A 06/07/2017   Procedure: ESOPHAGOGASTRODUODENOSCOPY (EGD) WITH PROPOFOL;  Surgeon: Manus Rudd  M, MD;  Location: [facility] ENDO [facility];  Service: Endoscopy;  Laterality: N/A;  . HYDROCELE EXCISION  10/14/2011   Procedure: HYDROCELECTOMY ADULT;  Surgeon: Malka So, MD;  Location: [facility] ORS;  Service: Urology;  Laterality: Right;  . LACERATION REPAIR     left hand  . LUMBAR LAMINECTOMY/DECOMPRESSION MICRODISCECTOMY Right 10/07/2015   Procedure: Right Lumbar four-five ,lumbar five sacral-one Laminectomy;  Surgeon: Leeroy Cha, MD;  Location: [place] NEURO ORS;   Service: Neurosurgery;  Laterality: Right;  . RIGHT/LEFT HEART CATH AND CORONARY ANGIOGRAPHY N/A 12/27/2017   Procedure: RIGHT/LEFT HEART CATH AND CORONARY ANGIOGRAPHY;  Surgeon: Belva Crome, MD;  Location: [place] CV LAB;  Service: Cardiovascular;  Laterality: N/A;    Current Medications: Outpatient Medications Prior to Visit  Medication Sig Dispense Refill  . acetaminophen (TYLENOL) 325 MG tablet Take 325 mg by mouth every 6 (six) hours as needed for moderate pain or headache.    . albuterol (PROVENTIL HFA;VENTOLIN HFA) 108 (90 Base) MCG/ACT inhaler Inhale 2 puffs into the lungs every 6 (six) hours as needed for wheezing or shortness of breath.    Marland Kitchen aspirin EC 81 MG tablet Take 81 mg by mouth daily.    Marland Kitchen atorvastatin (LIPITOR) 80 MG tablet Take 80 mg by mouth daily.    . cloNIDine (CATAPRES) 0.2 MG tablet Take 0.2 mg by mouth 2 (two) times daily.     . furosemide (LASIX) 20 MG tablet Take 20 mg by mouth daily as needed.    . Insulin Degludec (TRESIBA FLEXTOUCH) 200 UNIT/ML SOPN Inject 40-50 Units into the skin daily.     Marland Kitchen ipratropium-albuterol (DUONEB) 0.5-2.5 (3) MG/3ML SOLN Take 3 mLs by nebulization every 6 (six) hours as needed (for shortness of breath or wheezing).     Marland Kitchen olmesartan-hydrochlorothiazide (BENICAR HCT) 40-25 MG tablet Take 1 tablet by mouth daily. 90 tablet 3  . Omega-3 Fatty Acids (FISH OIL PO) Take 1 capsule by mouth every other day.    . pantoprazole (PROTONIX) 40 MG tablet Take 40 mg by mouth daily.    . potassium chloride (K-DUR) 10 MEQ tablet Take with furosemide 90 tablet 3  . SPIRIVA HANDIHALER 18 MCG inhalation capsule Place 1 puff into inhaler and inhale daily.    . TRULICITY 1.5 UM/3.5TI SOPN Inject 1.5 mg into the skin every Saturday.     . hydrALAZINE (APRESOLINE) 50 MG tablet Take 1 tablet (50 mg total) by mouth 3 (three) times daily. 270 tablet 3   No facility-administered medications prior to visit.      Allergies:   Amlodipine; Novolog [insulin  aspart]; and Victoza [liraglutide]   Social History   Socioeconomic History  . Marital status: Married    Spouse name: Not on file  . Number of children: Not on file  . Years of education: Not on file  . Highest education level: Not on file  Occupational History  . Not on file  Social Needs  . Financial resource strain: Not on file  . Food insecurity:    Worry: Not on file    Inability: Not on file  . Transportation needs:    Medical: Not on file    Non-medical: Not on file  Tobacco Use  . Smoking status: Never Smoker  . Smokeless tobacco: Never Used  Substance and Sexual Activity  . Alcohol use: No    Alcohol/week: 0.0 oz    Frequency: Never    Comment: rare beer  . Drug use: No  . Sexual  activity: Yes    Birth control/protection: None  Lifestyle  . Physical activity:    Days per week: Not on file    Minutes per session: Not on file  . Stress: Not on file  Relationships  . Social connections:    Talks on phone: Not on file    Gets together: Not on file    Attends religious service: Not on file    Active member of club or organization: Not on file    Attends meetings of clubs or organizations: Not on file    Relationship status: Not on file  Other Topics Concern  . Not on file  Social History Narrative  . Not on file     Family History:  The patient's family history includes CAD in his unknown relative; Cancer in his unknown relative; Diabetes in his unknown relative; Hypertension in his unknown relative.   Review of Systems:   Please see the history of present illness.     General:  No chills, fever, night sweats or weight changes.  Cardiovascular:  No chest pain, dyspnea on exertion, orthopnea, palpitations, paroxysmal nocturnal dyspnea. Positive for edema.  Dermatological: No rash, lesions/masses Respiratory: No cough, dyspnea Urologic: No hematuria, dysuria Abdominal:   No nausea, vomiting, diarrhea, bright red blood per rectum, melena, or  hematemesis Neurologic:  No visual changes, wkns, changes in mental status. Positive for headaches.   All other systems reviewed and are otherwise negative except as noted above.   Physical Exam:    VS:  BP (!) 174/88   Pulse 90   Ht 5\' 6"  (1.676 m)   Wt 279 lb (126.6 kg)   SpO2 94%   BMI 45.03 kg/m    General: Well developed, well nourished obese African American male appearing in no acute distress. Head: Normocephalic, atraumatic, sclera non-icteric, no xanthomas, nares are without discharge.  Neck: No carotid bruits. JVD not elevated.  Lungs: Respirations regular and unlabored, without wheezes or rales.  Heart: Regular rate and rhythm. No S3 or S4.  No murmur, no rubs, or gallops appreciated. Abdomen: Soft, non-tender, non-distended with normoactive bowel sounds. No hepatomegaly. No rebound/guarding. No obvious abdominal masses. Msk:  Strength and tone appear normal for age. No joint deformities or effusions. Extremities: No clubbing or cyanosis. 1+ pitting edema bilaterally.  Distal pedal pulses are 2+ bilaterally. Neuro: Alert and oriented X 3. Moves all extremities spontaneously. No focal deficits noted. Psych:  Responds to questions appropriately with a normal affect. Skin: No rashes or lesions noted  Wt Readings from Last 3 Encounters:  02/08/18 279 lb (126.6 kg)  02/06/18 279 lb (126.6 kg)  02/03/18 282 lb (127.9 kg)    Studies/Labs Reviewed:   EKG:  EKG is not ordered today.    Recent Labs: 09/17/2017: ALT 39; Magnesium 1.9 02/03/2018: B Natriuretic Peptide 88.0; BUN 12; Creatinine, Ser 1.17; Hemoglobin 13.5; Platelets 169; Potassium 3.6; Sodium 138   Lipid Panel    Component Value Date/Time   CHOL 193 01/04/2013 0804   TRIG 129 01/04/2013 0804   HDL 45 01/04/2013 0804   CHOLHDL 4.3 01/04/2013 0804   VLDL 26 01/04/2013 0804   LDLCALC 122 (H) 01/04/2013 0804    Additional studies/ records that were reviewed today include:   Echocardiogram: 09/2017 Study  Conclusions - Left ventricle: The cavity size was mildly dilated. Wall   thickness was increased in a pattern of severe LVH. Systolic   function was normal. The estimated ejection fraction was in the  range of 60% to 65%.  Impressions: - LVEF is normal Cannot evaluate regional wall motion fully as   endocardium is not well seen. Consider limited echo with Definity   to define wall motion   Cardiac Catheterization: 12/27/2017  Widely patent coronary arteries with normal left main, 30% mid LAD, luminal irregularities in the proximal and mid circumflex, and 30 to 40% stenosis in the mid RCA.  Mild global left ventricular systolic dysfunction with EF 40 to 45%.  Severely elevated end-diastolic pressure consistent with chronic combined systolic and diastolic heart failure.  Severe poorly controlled blood pressure during the procedure.  Severe BP elevation during the procedure requiring IV hydralazine, labetalol, and IV nitroglycerin drip.  Stable right femoral after Mynx arteriotomy closure device.  RECOMMENDATIONS:   IV Lasix x1 and more aggressive outpatient diuresis  Guideline directed therapy for systolic/diastolic heart failure.  Assessment:    1. Accelerated hypertension   2. Chronic diastolic CHF (congestive heart failure) ([place])   3. Hypertensive heart disease without heart failure   4. Coronary artery disease involving native coronary artery of native heart without angina pectoris   5. Hyperlipidemia LDL goal <70      Plan:   In order of problems listed above:  1. Accelerated HTN - The patient has a history of elevated BP dating back 30+ years by his report. At the time of his last visit, Olmesartan-HCTZ was titrated to 40-25 mg daily and he was also started on Hydralazine 25 mg twice daily.  Since then, he has been evaluated in clinic and in the ED with further titration of Hydralazine to 50 mg 3 times daily. BP remains elevated, initially at 190/90 during today's  visit, improved to 174/88 on recheck. Will plan to further titrate Hydralazine to 100 mg 3 times daily. We discussed obtaining a renal ultrasound due to his persistent elevated readings but he declines at this time. Would reconsider at the time of follow-up if BP remains significantly elevated. Will arrange for a BP check in 2 weeks. At that time, would consider switching from Hydrochlorothiazide to Chlorthalidone for improved BP effect (have not done so as of yet due to HCTZ being a combination pill) or starting Spironolactone. Intolerant to Amlodipine and BB therapy in the past due to ED issues which has led to noncompliance with these medications.   2. Chronic Diastolic CHF/ Hypertensive Heart Disease - Echocardiogram in 09/2017 showed a preserved EF of 60 to 65% but he did have severe LVH. BP remains elevated as outlined above. Will plan to continue to further titrate his antihypertensive medication regimen. - Lungs are clear on examination but he does have chronic 1+ pitting edema. He remains on Olmesartan-HCTZ 40-25mg  daily and Lasix 20 mg PRN (has been taking this 1-2 times weekly). Creatinine was stable at 1.17 when checked on 02/03/2018.  3. CAD - recent catheterization showed mild-nonobstructive CAD as outlined above. He denies any recent anginal symptoms. Does report one episode of sternal chest discomfort while consuming food last night and resolved with positional changes. Overall this sounds atypical for a cardiac etiology. - No plans for further ischemic evaluation at this time given his recent cath. - continue ASA and statin therapy.   4. HLD - Followed by PCP. He remains on Atorvastatin 80 mg daily.   Medication Adjustments/Labs and Tests Ordered: Current medicines are reviewed at length with the patient today.  Concerns regarding medicines are outlined above.  Medication changes, Labs and Tests ordered today are listed in the  Patient Instructions below. Patient Instructions   Medication Instructions:  Your physician recommends that you continue on your current medications as directed. Please refer to the Current Medication list given to you today.  Increase Hydralazine to 100 mg Three Times Daily   Labwork: NONE   Testing/Procedures: NONE   Follow-Up: Your physician recommends that you schedule a follow-up appointment in: 2 Weeks for Blood Pressure Check Your physician recommends that you schedule a follow-up appointment in: 4 Weeks   Any Other Special Instructions Will Be Listed Below (If Applicable).  If you need a refill on your cardiac medications before your next appointment, please call your pharmacy. Thank you for choosing [facility]!     Signed, Erma Heritage, PA-C  02/08/2018 4:51 PM    [facility] [address] [address] [place], [place] [address] Phone: [phone]

## 2018-02-07 NOTE — Progress Notes (Signed)
cc'ed to pcp °

## 2018-02-08 ENCOUNTER — Ambulatory Visit: Payer: PPO | Admitting: Student

## 2018-02-08 ENCOUNTER — Encounter: Payer: Self-pay | Admitting: Student

## 2018-02-08 VITALS — BP 174/88 | HR 90 | Ht 66.0 in | Wt 279.0 lb

## 2018-02-08 DIAGNOSIS — I10 Essential (primary) hypertension: Secondary | ICD-10-CM

## 2018-02-08 DIAGNOSIS — I5032 Chronic diastolic (congestive) heart failure: Secondary | ICD-10-CM | POA: Diagnosis not present

## 2018-02-08 DIAGNOSIS — E785 Hyperlipidemia, unspecified: Secondary | ICD-10-CM | POA: Diagnosis not present

## 2018-02-08 DIAGNOSIS — I119 Hypertensive heart disease without heart failure: Secondary | ICD-10-CM | POA: Diagnosis not present

## 2018-02-08 DIAGNOSIS — I2510 Atherosclerotic heart disease of native coronary artery without angina pectoris: Secondary | ICD-10-CM | POA: Diagnosis not present

## 2018-02-08 MED ORDER — HYDRALAZINE HCL 50 MG PO TABS: 100.0000 mg | ORAL_TABLET | Freq: Three times a day (TID) | ORAL | 3 refills | Status: DC

## 2018-02-08 NOTE — Patient Instructions (Signed)
Medication Instructions:  Your physician recommends that you continue on your current medications as directed. Please refer to the Current Medication list given to you today.  Increase Hydralazine to 100 mg Three Times Daily   Labwork: NONE   Testing/Procedures: NONE   Follow-Up: Your physician recommends that you schedule a follow-up appointment in: 2 Weeks for Blood Pressure Check Your physician recommends that you schedule a follow-up appointment in: 4 Weeks     Any Other Special Instructions Will Be Listed Below (If Applicable).     If you need a refill on your cardiac medications before your next appointment, please call your pharmacy. Thank you for choosing [facility]!

## 2018-02-13 ENCOUNTER — Other Ambulatory Visit: Payer: Self-pay | Admitting: Student

## 2018-02-13 MED ORDER — HYDRALAZINE HCL 100 MG PO TABS: 100.0000 mg | ORAL_TABLET | Freq: Three times a day (TID) | ORAL | 0 refills | Status: DC

## 2018-02-13 NOTE — Telephone Encounter (Signed)
rx sent

## 2018-02-13 NOTE — Addendum Note (Signed)
Addended by: Debbora Lacrosse R on: 02/13/2018 09:54 AM   Modules accepted: Orders

## 2018-02-13 NOTE — Telephone Encounter (Signed)
Please send new Rx for his hydrALAZINE (APRESOLINE) 50 MG tablet [[id number]]  To [facility].. He wants the 100mg  so he only has to take it 3 times a day

## 2018-02-22 ENCOUNTER — Ambulatory Visit (INDEPENDENT_AMBULATORY_CARE_PROVIDER_SITE_OTHER): Payer: PPO

## 2018-02-22 VITALS — BP 193/85 | HR 88 | Ht 66.0 in | Wt 277.0 lb

## 2018-02-22 DIAGNOSIS — Z0130 Encounter for examination of blood pressure without abnormal findings: Secondary | ICD-10-CM | POA: Diagnosis not present

## 2018-02-22 MED ORDER — NEBIVOLOL HCL 10 MG PO TABS: 10.0000 mg | ORAL_TABLET | Freq: Every day | ORAL | 6 refills | Status: DC

## 2018-02-22 NOTE — Progress Notes (Signed)
Patient arrives for BP check, states his BP log shows continued elevated blood pressures. He has not missed any doses of medications.    Will discuss with B.Strader PA-C   Pt to start Bystolic 10 mg daily

## 2018-02-22 NOTE — Patient Instructions (Addendum)
START Bystolic 10 mg daily   Stay on all other medications    Keep a blood pressure log    Keep apt for 03/08/18 at 1 pm with Bernerd Pho PA-C      Thank you for choosing [facility] !

## 2018-03-07 ENCOUNTER — Encounter: Payer: Self-pay | Admitting: *Deleted

## 2018-03-08 ENCOUNTER — Encounter: Payer: Self-pay | Admitting: Student

## 2018-03-08 ENCOUNTER — Ambulatory Visit (INDEPENDENT_AMBULATORY_CARE_PROVIDER_SITE_OTHER): Payer: PPO | Admitting: Student

## 2018-03-08 VITALS — BP 152/80 | HR 77 | Ht 66.0 in | Wt 271.0 lb

## 2018-03-08 DIAGNOSIS — E785 Hyperlipidemia, unspecified: Secondary | ICD-10-CM

## 2018-03-08 DIAGNOSIS — I119 Hypertensive heart disease without heart failure: Secondary | ICD-10-CM | POA: Diagnosis not present

## 2018-03-08 DIAGNOSIS — I5032 Chronic diastolic (congestive) heart failure: Secondary | ICD-10-CM | POA: Diagnosis not present

## 2018-03-08 DIAGNOSIS — I10 Essential (primary) hypertension: Secondary | ICD-10-CM

## 2018-03-08 DIAGNOSIS — I2510 Atherosclerotic heart disease of native coronary artery without angina pectoris: Secondary | ICD-10-CM

## 2018-03-08 MED ORDER — NEBIVOLOL HCL 20 MG PO TABS: 20.0000 mg | ORAL_TABLET | Freq: Every day | ORAL | 11 refills | Status: DC

## 2018-03-08 NOTE — Patient Instructions (Signed)
Medication Instructions:  Your physician has recommended you make the following change in your medication:  Increase Bystolic to 20 mg Daily    Labwork: NONE   Testing/Procedures: Your physician has requested that you have a renal artery duplex. During this test, an ultrasound is used to evaluate blood flow to the kidneys. Allow one hour for this exam. Do not eat after midnight the day before and avoid carbonated beverages. Take your medications as you usually do.    Follow-Up: Your physician recommends that you schedule a follow-up appointment in: 4-6 Weeks    Any Other Special Instructions Will Be Listed Below (If Applicable).     If you need a refill on your cardiac medications before your next appointment, please call your pharmacy. Thank you for choosing [facility]!

## 2018-03-08 NOTE — Progress Notes (Signed)
Cardiology Office Note    Date:  03/08/2018   ID:  DOSS CYBULSKI, DOB 06-30-1951, MRN [id number]  PCP:  [name], MD  Cardiologist: [name], MD    Chief Complaint  Patient presents with  . Follow-up    4 week visit    History of Present Illness:    Vincent Ramos is a 67 y.o. male with past medical history of CAD (nonobstructive CAD by cath in 12/2017), chronic diastolic CHF, uncontrolled HTN, HLD, Type 2 DM, and COPD who presents to the office today for 4-week follow-up.  He was last examined by myself on 02/08/2018 and reported that SBP had been elevated in the 190's to 200's over the past several days. He had missed intermittent doses of Hydralazine and reported having a headache when BP was elevated.  He was continued on Olmesartan-HCTZ 40-25mg  daily and Clonidine 0.2mg  BID with Hydralazine being further titrated to 100mg  TID.   He presented for a BP check on 02/22/2018 and this was elevated at 193/85. Had been intolerant to multiple beta-blockers and Amlodipine in the past due to side effects of erectile dysfunction, therefore he was started on Bystolic 10 mg daily given its lower side-effect profile.  In talking with the patient today, he denies any recent chest pain or dyspnea on exertion. No recent orthopnea, PND, or lower extremity edema. He reports good compliance with his current medication regimen but says that SBP has been variable from the 140's to over 200 when checked at home. Just purchased a new cuff approximately 2 days ago and says readings have been variable since this. He did not bring the cuff with him today. BP is actually improved to 152/80 during today's visit (rechecked personally with similar results) which is the lowest it has been in several months.    Past Medical History:  Diagnosis Date  . Arthritis   . Asthma   . CAD (coronary artery disease)    a. 11/2017: cath showing widely patent coronary arteries with normal left main, 30% mid LAD,  luminal irregularities in the proximal and mid circumflex, and 30 to 40% stenosis in the mid RCA.  Marland Kitchen CAP (community acquired pneumonia) 07/31/2014  . Enlarged prostate   . Grade II diastolic dysfunction   . Hypertension   . Pneumonia   . Pneumonia   . Shortness of breath   . Type 2 diabetes mellitus ([place])     Past Surgical History:  Procedure Laterality Date  . CIRCUMCISION    . COLONOSCOPY WITH PROPOFOL N/A 08/08/2017   Procedure: COLONOSCOPY WITH PROPOFOL;  Surgeon: Danie Binder, MD;  [facility];  Service: Endoscopy;  Laterality: N/A;  1:00pm  . ESOPHAGEAL DILATION N/A 06/07/2017   Procedure: ESOPHAGEAL DILATION;  Surgeon: Daneil Dolin, MD;  [facility];  Service: Endoscopy;  Laterality: N/A;  . ESOPHAGOGASTRODUODENOSCOPY (EGD) WITH PROPOFOL N/A 06/07/2017   Procedure: ESOPHAGOGASTRODUODENOSCOPY (EGD) WITH PROPOFOL;  Surgeon: Daneil Dolin, MD;  [facility];  Service: Endoscopy;  Laterality: N/A;  . HYDROCELE EXCISION  10/14/2011   Procedure: HYDROCELECTOMY ADULT;  Surgeon: Malka So, MD;  Location: [facility];  Service: Urology;  Laterality: Right;  . LACERATION REPAIR     left hand  . LUMBAR LAMINECTOMY/DECOMPRESSION MICRODISCECTOMY Right 10/07/2015   Procedure: Right Lumbar four-five ,lumbar five sacral-one Laminectomy;  Surgeon: Leeroy Cha, MD;  Location: [facility];  Service: Neurosurgery;  Laterality: Right;  . RIGHT/LEFT HEART CATH AND CORONARY  ANGIOGRAPHY N/A 12/27/2017   Procedure: RIGHT/LEFT HEART CATH AND CORONARY ANGIOGRAPHY;  Surgeon: Belva Crome, MD;  Location: [facility];  Service: Cardiovascular;  Laterality: N/A;    Current Medications: Outpatient Medications Prior to Visit  Medication Sig Dispense Refill  . acetaminophen (TYLENOL) 325 MG tablet Take 325 mg by mouth every 6 (six) hours as needed for moderate pain or headache.    . albuterol (PROVENTIL HFA;VENTOLIN HFA) 108 (90 Base) MCG/ACT inhaler Inhale 2  puffs into the lungs every 6 (six) hours as needed for wheezing or shortness of breath.    Marland Kitchen aspirin EC 81 MG tablet Take 81 mg by mouth daily.    . cloNIDine (CATAPRES) 0.2 MG tablet Take 0.2 mg by mouth 2 (two) times daily.     . hydrALAZINE (APRESOLINE) 100 MG tablet Take 1 tablet (100 mg total) by mouth 3 (three) times daily. 270 tablet 0  . Insulin Degludec (TRESIBA FLEXTOUCH) 200 UNIT/ML SOPN Inject 40-50 Units into the skin daily.     Marland Kitchen ipratropium-albuterol (DUONEB) 0.5-2.5 (3) MG/3ML SOLN Take 3 mLs by nebulization every 6 (six) hours as needed (for shortness of breath or wheezing).     Marland Kitchen olmesartan-hydrochlorothiazide (BENICAR HCT) 40-25 MG tablet Take 1 tablet by mouth daily. 90 tablet 3  . Omega-3 Fatty Acids (FISH OIL PO) Take 1 capsule by mouth every other day.    . pantoprazole (PROTONIX) 40 MG tablet Take 40 mg by mouth daily.    . potassium chloride (K-DUR) 10 MEQ tablet Take with furosemide 90 tablet 3  . SPIRIVA HANDIHALER 18 MCG inhalation capsule Place 1 puff into inhaler and inhale daily.    . TRULICITY 1.5 ZJ/6.9CV SOPN Inject 1.5 mg into the skin every Saturday.     . nebivolol (BYSTOLIC) 10 MG tablet Take 1 tablet (10 mg total) by mouth daily. 30 tablet 6  . atorvastatin (LIPITOR) 80 MG tablet Take 80 mg by mouth daily.    . furosemide (LASIX) 20 MG tablet Take 20 mg by mouth daily as needed.     No facility-administered medications prior to visit.      Allergies:   Amlodipine; Novolog [insulin aspart]; and Victoza [liraglutide]   Social History   Socioeconomic History  . Marital status: Married    Spouse name: Not on file  . Number of children: Not on file  . Years of education: Not on file  . Highest education level: Not on file  Occupational History  . Not on file  Social Needs  . Financial resource strain: Not on file  . Food insecurity:    Worry: Not on file    Inability: Not on file  . Transportation needs:    Medical: Not on file    Non-medical:  Not on file  Tobacco Use  . Smoking status: Never Smoker  . Smokeless tobacco: Never Used  Substance and Sexual Activity  . Alcohol use: No    Alcohol/week: 0.0 standard drinks    Frequency: Never    Comment: rare beer  . Drug use: No  . Sexual activity: Yes    Birth control/protection: None  Lifestyle  . Physical activity:    Days per week: Not on file    Minutes per session: Not on file  . Stress: Not on file  Relationships  . Social connections:    Talks on phone: Not on file    Gets together: Not on file    Attends religious service: Not on file  Active member of club or organization: Not on file    Attends meetings of clubs or organizations: Not on file    Relationship status: Not on file  Other Topics Concern  . Not on file  Social History Narrative  . Not on file     Family History:  The patient's family history includes CAD in his unknown relative; Cancer in his unknown relative; Diabetes in his sister and unknown relative; Hypertension in his sister and unknown relative.   Review of Systems:   Please see the history of present illness.     General:  No chills, fever, night sweats or weight changes. Positive for fatigue.  Cardiovascular:  No chest pain, dyspnea on exertion, edema, orthopnea, palpitations, paroxysmal nocturnal dyspnea. Dermatological: No rash, lesions/masses Respiratory: No cough, dyspnea Urologic: No hematuria, dysuria Abdominal:   No nausea, vomiting, diarrhea, bright red blood per rectum, melena, or hematemesis Neurologic:  No visual changes, wkns, changes in mental status. Positive for headaches.   All other systems reviewed and are otherwise negative except as noted above.   Physical Exam:    VS:  BP (!) 152/80   Pulse 77   Ht 5\' 6"  (1.676 m)   Wt 271 lb (122.9 kg)   SpO2 95%   BMI 43.74 kg/m    General: Well developed, obese African American male appearing in no acute distress. Head: Normocephalic, atraumatic, sclera  non-icteric, no xanthomas, nares are without discharge.  Neck: No carotid bruits. JVD not elevated.  Lungs: Respirations regular and unlabored, without wheezes or rales.  Heart: Regular rate and rhythm. No S3 or S4.  No murmur, no rubs, or gallops appreciated. Abdomen: Soft, non-tender, non-distended with normoactive bowel sounds. No hepatomegaly. No rebound/guarding. No obvious abdominal masses. Msk:  Strength and tone appear normal for age. No joint deformities or effusions. Extremities: No clubbing or cyanosis. Trace edema bilaterally.  Distal pedal pulses are 2+ bilaterally. Neuro: Alert and oriented X 3. Moves all extremities spontaneously. No focal deficits noted. Psych:  Responds to questions appropriately with a normal affect. Skin: No rashes or lesions noted  Wt Readings from Last 3 Encounters:  03/08/18 271 lb (122.9 kg)  02/22/18 277 lb (125.6 kg)  02/08/18 279 lb (126.6 kg)     Studies/Labs Reviewed:   EKG:  EKG is not ordered today.  Recent Labs: 09/17/2017: ALT 39; Magnesium 1.9 02/03/2018: B Natriuretic Peptide 88.0; BUN 12; Creatinine, Ser 1.17; Hemoglobin 13.5; Platelets 169; Potassium 3.6; Sodium 138   Lipid Panel    Component Value Date/Time   CHOL 193 01/04/2013 0804   TRIG 129 01/04/2013 0804   HDL 45 01/04/2013 0804   CHOLHDL 4.3 01/04/2013 0804   VLDL 26 01/04/2013 0804   LDLCALC 122 (H) 01/04/2013 0804    Additional studies/ records that were reviewed today include:   Echocardiogram: 09/2017 Study Conclusions  - Left ventricle: The cavity size was mildly dilated. Wall   thickness was increased in a pattern of severe LVH. Systolic   function was normal. The estimated ejection fraction was in the   range of 60% to 65%.  Impressions:  - LVEF is normal Cannot evaluate regional wall motion fully as   endocardium is not well seen. Consider limited echo with Definity   to define wall motion  Cardiac Catheterization: 12/27/2017  Widely patent  coronary arteries with normal left main, 30% mid LAD, luminal irregularities in the proximal and mid circumflex, and 30 to 40% stenosis in the mid RCA.  Mild  global left ventricular systolic dysfunction with EF 40 to 45%.  Severely elevated end-diastolic pressure consistent with chronic combined systolic and diastolic heart failure.  Severe poorly controlled blood pressure during the procedure.  Severe BP elevation during the procedure requiring IV hydralazine, labetalol, and IV nitroglycerin drip.  Stable right femoral after Mynx arteriotomy closure device.  RECOMMENDATIONS:   IV Lasix x1 and more aggressive outpatient diuresis  Guideline directed therapy for systolic/diastolic heart failure.  Assessment:    1. Accelerated hypertension   2. Chronic diastolic CHF (congestive heart failure) ([place])   3. Hypertensive heart disease without heart failure   4. Coronary artery disease involving native coronary artery of native heart without angina pectoris   5. Hyperlipidemia LDL goal <70      Plan:   In order of problems listed above:  1. Accelerated HTN - BP has remained significantly elevated over the past several months. This has been challenging to control given his medication intolerances and abruptly discontinuing medications. Today, BP is improved to 152/80 which is the lowest his blood pressure has been in the past several months. - Will continue on Olmesartan-HCTZ 40-25mg  daily, Clonidine 0.2mg  BID, and Hydralazine 100mg  TID. He was previously intolerant to Amlodipine and beta-blocker therapy in the past due to the side effect of erectile dysfunction. We started Bystolic approximately 2 weeks ago and he denies any noted side effects to this medication. Will plan to titrate from 10 mg daily to 20 mg daily today. If BP remains elevated, would look at switching from HCTZ to Chlorthalidone or adding Spironolactone. Given his difficult to control hypertension and labile readings when  checked at home, will obtain renal dopplers as recommended at prior visits but he previously declined. Is now in agreement to having this checked.  2. Chronic Diastolic CHF/ Hypertensive Heart Disease - Most recent echocardiogram showed a preserved EF of 60 to 65% with severe LVH. He does have chronic lower extremity edema but denies any recent changes in his respiratory status. Weight has declined by 6 pounds since his last office visit. - Remains on Benicar and Bystolic. He does have Lasix which she takes as needed (never took this daily due to frequent urination and also being on HCTZ).   3. CAD - recent catheterization showedmild-nonobstructive CAD as outlined above. He denies any recent anginal symptoms. Continue ASA, beta-blocker, and Fish Oil. He has self-discontinued Atorvastatin since his last office visit and is not open to restarting a statin medication at this time.  4. HLD - Followed by PCP. Goal LDL is less than 70. He was previously on Atorvastatin but stopped this due to myalgias. Not open to additional cholesterol medications at this time. Will readdress at follow-up.   Medication Adjustments/Labs and Tests Ordered: Current medicines are reviewed at length with the patient today.  Concerns regarding medicines are outlined above.  Medication changes, Labs and Tests ordered today are listed in the Patient Instructions below. Patient Instructions  Medication Instructions:  Your physician recommends that you continue on your current medications as directed. Please refer to the Current Medication list given to you today.  Increase Hydralazine to 100 mg Three Times Daily   Labwork:   Medication Adjustments/Labs and Tests Ordered: Current medicines are reviewed at length with the patient today.  Concerns regarding medicines are outlined above.  Medication changes, Labs and Tests ordered today are listed in the Patient Instructions below. Patient Instructions  Medication  Instructions:  Your physician has recommended you make the following change in  your medication:  Increase Bystolic to 20 mg Daily    Labwork: NONE   Testing/Procedures: Your physician has requested that you have a renal artery duplex. During this test, an ultrasound is used to evaluate blood flow to the kidneys. Allow one hour for this exam. Do not eat after midnight the day before and avoid carbonated beverages. Take your medications as you usually do.    Follow-Up: Your physician recommends that you schedule a follow-up appointment in: 4-6 Weeks    Any Other Special Instructions Will Be Listed Below (If Applicable).     If you need a refill on your cardiac medications before your next appointment, please call your pharmacy. Thank you for choosing [facility]!       Signed, Erma Heritage, PA-C  03/08/2018 5:14 PM    [facility] [address] [place], [place] [address] Phone: [phone]

## 2018-03-12 ENCOUNTER — Other Ambulatory Visit: Payer: Self-pay

## 2018-03-12 NOTE — Patient Outreach (Signed)
[facility]) Care Management  03/12/2018  [place] 05/30/1951 [id number]   Telephone Screen  Referral Date: 03/12/18 Referral Source: Nurse Call Center Referral Reason: " caller states that he is having a hard time maintaining an erection for several months" Insurance: HTA   Outreach attempt # 1 to patient. No answer at present. RN CM left HIPAA compliant voicemail message along with contact info.       Plan: RN CM will make outreach attempt to patient within 3-4 business days. RN CM will send unsuccessful outreach letter to patient.    Enzo Montgomery, RN,BSN,CCM [facility] Management Telephonic Care Management Coordinator Direct Phone: [phone] Toll Free: [phone] Fax: [phone]

## 2018-03-13 ENCOUNTER — Other Ambulatory Visit: Payer: Self-pay

## 2018-03-13 NOTE — Patient Outreach (Signed)
[facility]) Care Management  03/13/2018  [place] 04/01/1951 [id number]   Telephone Screen  Referral Date: 03/12/18 Referral Source: Nurse Call Center Referral Reason: " caller states that he is having a hard time maintaining an erection for several months" Insurance: HTA   Outreach attempt #2 to patient. No answer at present and unable to leave message.       Plan: RN CM will make outreach attempt to patient within 3-4 business days.    Enzo Montgomery, RN,BSN,CCM [facility] Management Telephonic Care Management Coordinator Direct Phone: [phone] Toll Free: [phone] Fax: [phone]

## 2018-03-15 ENCOUNTER — Other Ambulatory Visit: Payer: Self-pay

## 2018-03-15 NOTE — Patient Outreach (Signed)
[facility]) Care Management  03/15/2018  [place] Feb 24, 1951 [id number]   Telephone Screen  Referral Date:03/12/18 Referral Source:Nurse Call Center Referral Reason:" caller states that he is having a hard time maintaining an erection for several months" Insurance:HTA   Outreach attempt #3 to patient. No answer at present and unable to leave message.     Plan: RN CM will close case if no response from letter mailed to patient.    Enzo Montgomery, RN,BSN,CCM [facility] Management Telephonic Care Management Coordinator Direct Phone: [phone] Toll Free: [phone] Fax: [phone]

## 2018-03-23 ENCOUNTER — Other Ambulatory Visit: Payer: Self-pay

## 2018-03-23 NOTE — Patient Outreach (Signed)
[facility]) Care Management  03/23/2018  [place] [birth year] [id number]   Telephone Screen  Referral Date:03/12/18 Referral Source:Nurse Call Center Referral Reason:" caller states that he is having a hard time maintaining an erection for several months" Insurance:HTA    Multiple attempts to establish contact with patient without success. No response from letter mailed to patient. Case is being closed at this time.       Plan: RN CM will close case at this time.    Enzo Montgomery, RN,BSN,CCM [facility] Management Telephonic Care Management Coordinator Direct Phone: [phone] Toll Free: [phone] Fax: [phone]

## 2018-04-16 DIAGNOSIS — I10 Essential (primary) hypertension: Secondary | ICD-10-CM | POA: Diagnosis not present

## 2018-04-16 DIAGNOSIS — J449 Chronic obstructive pulmonary disease, unspecified: Secondary | ICD-10-CM | POA: Diagnosis not present

## 2018-04-16 DIAGNOSIS — I5032 Chronic diastolic (congestive) heart failure: Secondary | ICD-10-CM | POA: Diagnosis not present

## 2018-04-16 DIAGNOSIS — E669 Obesity, unspecified: Secondary | ICD-10-CM | POA: Diagnosis not present

## 2018-04-17 ENCOUNTER — Ambulatory Visit (INDEPENDENT_AMBULATORY_CARE_PROVIDER_SITE_OTHER): Payer: PPO

## 2018-04-17 DIAGNOSIS — I10 Essential (primary) hypertension: Secondary | ICD-10-CM

## 2018-04-19 DIAGNOSIS — H2511 Age-related nuclear cataract, right eye: Secondary | ICD-10-CM | POA: Diagnosis not present

## 2018-04-19 DIAGNOSIS — H25013 Cortical age-related cataract, bilateral: Secondary | ICD-10-CM | POA: Diagnosis not present

## 2018-04-19 DIAGNOSIS — Z794 Long term (current) use of insulin: Secondary | ICD-10-CM | POA: Diagnosis not present

## 2018-04-19 DIAGNOSIS — H52203 Unspecified astigmatism, bilateral: Secondary | ICD-10-CM | POA: Diagnosis not present

## 2018-04-19 DIAGNOSIS — H524 Presbyopia: Secondary | ICD-10-CM | POA: Diagnosis not present

## 2018-04-19 DIAGNOSIS — H2513 Age-related nuclear cataract, bilateral: Secondary | ICD-10-CM | POA: Diagnosis not present

## 2018-04-19 DIAGNOSIS — E119 Type 2 diabetes mellitus without complications: Secondary | ICD-10-CM | POA: Diagnosis not present

## 2018-04-19 DIAGNOSIS — H25011 Cortical age-related cataract, right eye: Secondary | ICD-10-CM | POA: Diagnosis not present

## 2018-04-24 NOTE — Progress Notes (Signed)
Cardiology Office Note    Date:  04/25/2018   ID:  GUNNER IODICE, DOB 04-17-1951, MRN [id number]  PCP:  [name], MD  Cardiologist: [name], MD    Chief Complaint  Patient presents with  . Follow-up    6 week visit    History of Present Illness:    Vincent Ramos is a 67 y.o. male with past medical history ofCAD (nonobstructive CAD by cath in 95/1884),[id number] diastolic CHF, difficult to control HTN, HLD, Type 2 DM, and COPD  who presents to the office today for 6-week follow-up.  He was last examined by myself on 03/08/2018 and denied any recent chest pain or dyspnea on exertion.  SBP have been variable from the 140's to 200's when checked at home but was improved to 152/80 during his office visit. He was continued on Olmesartan-HCTZ 40-25mg  daily, Clonidine 0.2mg  BID, and Hydralazine 100mg  TID with Bystolic being titrated from 10mg  daily to 20mg  daily (had been intolerant to other BB therapy and Amlodipine in the past due to ED). Renal Dopplers were obtained in the interim given his variable BP and showed no evidence of renal artery stenosis.  In talking with the patient today, he reports overall doing well from a cardiac perspective since his last office visit.  He has resumed working out at [employer] and has been walking on the treadmill and lifting weights without any recurrent anginal symptoms. Denies any recent dyspnea on exertion, orthopnea, PND, lower extremity edema, chest pain, or palpitations. He has not had to utilize PRN Lasix since the time of his last office visit.  He did self-reduce Hydralazine to 100mg  once to two times daily due to headaches after taking the medication. Has been intermittently taking extra Bystolic to account for this but says BP has been well-controlled when checked at home.    Past Medical History:  Diagnosis Date  . Arthritis   . Asthma   . CAD (coronary artery disease)    a. 11/2017: cath showing widely patent coronary arteries with  normal left main, 30% mid LAD, luminal irregularities in the proximal and mid circumflex, and 30 to 40% stenosis in the mid RCA.  Marland Kitchen CAP (community acquired pneumonia) 07/31/2014  . Enlarged prostate   . Grade II diastolic dysfunction   . Hypertension   . Pneumonia   . Pneumonia   . Shortness of breath   . Type 2 diabetes mellitus ([place])     Past Surgical History:  Procedure Laterality Date  . CIRCUMCISION    . COLONOSCOPY WITH PROPOFOL N/A 08/08/2017   Procedure: COLONOSCOPY WITH PROPOFOL;  Surgeon: Danie Binder, MD;  [facility];  Service: Endoscopy;  Laterality: N/A;  1:00pm  . ESOPHAGEAL DILATION N/A 06/07/2017   Procedure: ESOPHAGEAL DILATION;  Surgeon: Daneil Dolin, MD;  [facility]: [facility];  Service: Endoscopy;  Laterality: N/A;  . ESOPHAGOGASTRODUODENOSCOPY (EGD) WITH PROPOFOL N/A 06/07/2017   Procedure: ESOPHAGOGASTRODUODENOSCOPY (EGD) WITH PROPOFOL;  Surgeon: Daneil Dolin, MD;  [facility]: [facility];  Service: Endoscopy;  Laterality: N/A;  . HYDROCELE EXCISION  10/14/2011   Procedure: HYDROCELECTOMY ADULT;  Surgeon: Malka So, MD;  Location: [facility];  Service: Urology;  Laterality: Right;  . LACERATION REPAIR     left hand  . LUMBAR LAMINECTOMY/DECOMPRESSION MICRODISCECTOMY Right 10/07/2015   Procedure: Right Lumbar four-five ,lumbar five sacral-one Laminectomy;  Surgeon: Leeroy Cha, MD;  Location: [facility];  Service: Neurosurgery;  Laterality: Right;  .  RIGHT/LEFT HEART CATH AND CORONARY ANGIOGRAPHY N/A 12/27/2017   Procedure: RIGHT/LEFT HEART CATH AND CORONARY ANGIOGRAPHY;  Surgeon: Belva Crome, MD;  Location: [facility];  Service: Cardiovascular;  Laterality: N/A;    Current Medications: Outpatient Medications Prior to Visit  Medication Sig Dispense Refill  . acetaminophen (TYLENOL) 325 MG tablet Take 325 mg by mouth every 6 (six) hours as needed for moderate pain or headache.    Marland Kitchen aspirin EC 81 MG tablet Take 81 mg by mouth daily.     . cloNIDine (CATAPRES) 0.2 MG tablet Take 0.2 mg by mouth 2 (two) times daily.     . Insulin Degludec (TRESIBA FLEXTOUCH) 200 UNIT/ML SOPN Inject 40-50 Units into the skin daily.     Marland Kitchen olmesartan-hydrochlorothiazide (BENICAR HCT) 40-25 MG tablet Take 1 tablet by mouth daily. 90 tablet 3  . potassium chloride (K-DUR) 10 MEQ tablet Take with furosemide 90 tablet 3  . TRULICITY 1.5 TD/1.7OH SOPN Inject 1.5 mg into the skin every Saturday.     Marland Kitchen albuterol (PROVENTIL HFA;VENTOLIN HFA) 108 (90 Base) MCG/ACT inhaler Inhale 2 puffs into the lungs every 6 (six) hours as needed for wheezing or shortness of breath.    . hydrALAZINE (APRESOLINE) 100 MG tablet Take 1 tablet (100 mg total) by mouth 3 (three) times daily. 270 tablet 0  . Nebivolol HCl (BYSTOLIC) 20 MG TABS Take 1 tablet (20 mg total) by mouth daily. 30 tablet 11  . SPIRIVA HANDIHALER 18 MCG inhalation capsule Place 1 puff into inhaler and inhale daily.    Marland Kitchen ipratropium-albuterol (DUONEB) 0.5-2.5 (3) MG/3ML SOLN Take 3 mLs by nebulization every 6 (six) hours as needed (for shortness of breath or wheezing).     . Omega-3 Fatty Acids (FISH OIL PO) Take 1 capsule by mouth every other day.    . pantoprazole (PROTONIX) 40 MG tablet Take 40 mg by mouth daily.     No facility-administered medications prior to visit.      Allergies:   Amlodipine; Novolog [insulin aspart]; and Victoza [liraglutide]   Social History   Socioeconomic History  . Marital status: Married    Spouse name: Not on file  . Number of children: Not on file  . Years of education: Not on file  . Highest education level: Not on file  Occupational History  . Not on file  Social Needs  . Financial resource strain: Not on file  . Food insecurity:    Worry: Not on file    Inability: Not on file  . Transportation needs:    Medical: Not on file    Non-medical: Not on file  Tobacco Use  . Smoking status: Never Smoker  . Smokeless tobacco: Never Used  Substance and Sexual  Activity  . Alcohol use: No    Alcohol/week: 0.0 standard drinks    Frequency: Never    Comment: rare beer  . Drug use: No  . Sexual activity: Yes    Birth control/protection: None  Lifestyle  . Physical activity:    Days per week: Not on file    Minutes per session: Not on file  . Stress: Not on file  Relationships  . Social connections:    Talks on phone: Not on file    Gets together: Not on file    Attends religious service: Not on file    Active member of club or organization: Not on file    Attends meetings of clubs or organizations: Not on file  Relationship status: Not on file  Other Topics Concern  . Not on file  Social History Narrative  . Not on file     Family History:  The patient's family history includes CAD in his unknown relative; Cancer in his unknown relative; Diabetes in his sister and unknown relative; Hypertension in his sister and unknown relative.   Review of Systems:   Please see the history of present illness.     General:  No chills, fever, night sweats or weight changes.  Cardiovascular:  No chest pain, dyspnea on exertion, edema, orthopnea, palpitations, paroxysmal nocturnal dyspnea. Dermatological: No rash, lesions/masses Respiratory: No cough, dyspnea Urologic: No hematuria, dysuria Abdominal:   No nausea, vomiting, diarrhea, bright red blood per rectum, melena, or hematemesis Neurologic:  No visual changes, wkns, changes in mental status. Positive for occasional headaches.   All other systems reviewed and are otherwise negative except as noted above.   Physical Exam:    VS:  BP 140/80 (BP Location: Right Arm)   Pulse 86   Ht 5\' 6"  (1.676 m)   Wt 277 lb (125.6 kg)   SpO2 94%   BMI 44.71 kg/m    General: Well developed, obese African American male appearing in no acute distress. Head: Normocephalic, atraumatic, sclera non-icteric, no xanthomas, nares are without discharge.  Neck: No carotid bruits. JVD not elevated.  Lungs:  Respirations regular and unlabored, without wheezes or rales.  Heart: Regular rate and rhythm. No S3 or S4.  No murmur, no rubs, or gallops appreciated. Abdomen: Soft, non-tender, non-distended with normoactive bowel sounds. No hepatomegaly. No rebound/guarding. No obvious abdominal masses. Msk:  Strength and tone appear normal for age. No joint deformities or effusions. Extremities: No clubbing or cyanosis. Trace lower extremity edema.  Distal pedal pulses are 2+ bilaterally. Neuro: Alert and oriented X 3. Moves all extremities spontaneously. No focal deficits noted. Psych:  Responds to questions appropriately with a normal affect. Skin: No rashes or lesions noted  Wt Readings from Last 3 Encounters:  04/25/18 277 lb (125.6 kg)  03/08/18 271 lb (122.9 kg)  02/22/18 277 lb (125.6 kg)     Studies/Labs Reviewed:   EKG:  EKG is not ordered today.    Recent Labs: 09/17/2017: ALT 39; Magnesium 1.9 02/03/2018: B Natriuretic Peptide 88.0; BUN 12; Creatinine, Ser 1.17; Hemoglobin 13.5; Platelets 169; Potassium 3.6; Sodium 138   Lipid Panel    Component Value Date/Time   CHOL 193 01/04/2013 0804   TRIG 129 01/04/2013 0804   HDL 45 01/04/2013 0804   CHOLHDL 4.3 01/04/2013 0804   VLDL 26 01/04/2013 0804   LDLCALC 122 (H) 01/04/2013 0804    Additional studies/ records that were reviewed today include:   Renal Dopplers: 04/17/2018 Renal:  Right: Normal right Resisitive Index. Normal cortical thickness of    right kidney. No evidence of right renal artery stenosis. RRV    flow present. Normal size right kidney. Left: Normal size of left kidney. Normal left Resistive Index.    Normal cortical thickness of the left kidney. No evidence of    left renal artery stenosis.  Cardiac Catheterization: 11/2017  Widely patent coronary arteries with normal left main, 30% mid LAD, luminal irregularities in the proximal and mid circumflex, and 30 to 40% stenosis in the mid  RCA.  Mild global left ventricular systolic dysfunction with EF 40 to 45%.  Severely elevated end-diastolic pressure consistent with chronic combined systolic and diastolic heart failure.  Severe poorly controlled blood pressure during the  procedure.  Severe BP elevation during the procedure requiring IV hydralazine, labetalol, and IV nitroglycerin drip.  Stable right femoral after Mynx arteriotomy closure device.  RECOMMENDATIONS:   IV Lasix x1 and more aggressive outpatient diuresis  Guideline directed therapy for systolic/diastolic heart failure.  Assessment:    1. Essential hypertension   2. Hypertensive heart disease without heart failure   3. Coronary artery disease involving native coronary artery of native heart without angina pectoris   4. Hyperlipidemia LDL goal <70      Plan:   In order of problems listed above:  1. Essential HTN - he is currently on Olmesartan-HCTZ 40-25mg  daily, Clonidine 0.2mg  BID, Hydralazine 100mg  TID and Bystolic 20mg  daily but self-reduced Hydralazine to once or BID dosing due to having headaches. BP is at 140/80 during today's visit and he reports SBP has been in the 130's to 150's when checked at home (previously staying in the 180's to 190's). We reviewed that he should not self-adjust his medications as skipping doses could cause rebound HTN. Will plan to titrate Bystolic to 40mg  daily and reduce Hydralazine to 100mg  BID. I suspect he will likely self-discontinue prior to his next visit but I strongly advised against this and encouraged him to keep a BP log and report back on any noted side-effects to his medications. He has been intolerant to Amlodipine and Coreg in the past due to side-effects with these medications but has overall been tolerating Bystolic well.   2. Hypertensive Heart Disease - He denies any recent dyspnea on exertion, orthopnea, PND, or lower extremity edema. Appears euvolemic by examination today. - Continue current  medications with adjustments as outlined above.  3. CAD - cardiac catheterization in 11/2017 showed mild nonobstructive CAD as outlined above. He denies any recent chest pain or dyspnea on exertion. - Continue on ASA and beta-blocker therapy.  4. HLD - Followed by PCP. He has self-discontinued statin therapy in the past due to myalgias and is not interested in additional medications for cholesterol at this time. Due for repeat fasting labs in 05/2018.   Medication Adjustments/Labs and Tests Ordered: Current medicines are reviewed at length with the patient today.  Concerns regarding medicines are outlined above.  Medication changes, Labs and Tests ordered today are listed in the Patient Instructions below. Patient Instructions  Medication Instructions:  Your physician has recommended you make the following change in your medication:  Increase Bystolic to 40 mg Daily  Decrease Hydralazine to 100 mg Two Times Daily   Labwork: NONE   Testing/Procedures: NONE   Follow-Up: Your physician recommends that you schedule a follow-up appointment in: December    Any Other Special Instructions Will Be Listed Below (If Applicable).  If you need a refill on your cardiac medications before your next appointment, please call your pharmacy.  Thank you for choosing [facility]!    Signed, Erma Heritage, PA-C  04/25/2018 1:57 PM    [facility] [address] [place], [place] [address] Phone: [phone]

## 2018-04-25 ENCOUNTER — Ambulatory Visit: Payer: PPO | Admitting: Student

## 2018-04-25 ENCOUNTER — Encounter: Payer: Self-pay | Admitting: Student

## 2018-04-25 VITALS — BP 140/80 | HR 86 | Ht 66.0 in | Wt 277.0 lb

## 2018-04-25 DIAGNOSIS — I119 Hypertensive heart disease without heart failure: Secondary | ICD-10-CM

## 2018-04-25 DIAGNOSIS — I10 Essential (primary) hypertension: Secondary | ICD-10-CM | POA: Diagnosis not present

## 2018-04-25 DIAGNOSIS — E785 Hyperlipidemia, unspecified: Secondary | ICD-10-CM

## 2018-04-25 DIAGNOSIS — I2510 Atherosclerotic heart disease of native coronary artery without angina pectoris: Secondary | ICD-10-CM

## 2018-04-25 MED ORDER — HYDRALAZINE HCL 100 MG PO TABS: 100.0000 mg | ORAL_TABLET | Freq: Two times a day (BID) | ORAL | 11 refills | Status: DC

## 2018-04-25 MED ORDER — NEBIVOLOL HCL 20 MG PO TABS: 40.0000 mg | ORAL_TABLET | Freq: Every day | ORAL | 11 refills | Status: DC

## 2018-04-25 NOTE — Patient Instructions (Signed)
Medication Instructions:  Your physician has recommended you make the following change in your medication:  Increase Bystolic to 40 mg Daily  Decrease Hydralazine to 100 mg Two Times Daily    Labwork: NONE   Testing/Procedures: NONE   Follow-Up: Your physician recommends that you schedule a follow-up appointment in: December    Any Other Special Instructions Will Be Listed Below (If Applicable).     If you need a refill on your cardiac medications before your next appointment, please call your pharmacy.  Thank you for choosing [facility]!

## 2018-04-30 DIAGNOSIS — I10 Essential (primary) hypertension: Secondary | ICD-10-CM | POA: Diagnosis not present

## 2018-04-30 DIAGNOSIS — E782 Mixed hyperlipidemia: Secondary | ICD-10-CM | POA: Diagnosis not present

## 2018-04-30 DIAGNOSIS — E1169 Type 2 diabetes mellitus with other specified complication: Secondary | ICD-10-CM | POA: Diagnosis not present

## 2018-04-30 DIAGNOSIS — E1129 Type 2 diabetes mellitus with other diabetic kidney complication: Secondary | ICD-10-CM | POA: Diagnosis not present

## 2018-05-02 DIAGNOSIS — G8929 Other chronic pain: Secondary | ICD-10-CM | POA: Diagnosis not present

## 2018-05-02 DIAGNOSIS — N400 Enlarged prostate without lower urinary tract symptoms: Secondary | ICD-10-CM | POA: Diagnosis not present

## 2018-05-02 DIAGNOSIS — E782 Mixed hyperlipidemia: Secondary | ICD-10-CM | POA: Diagnosis not present

## 2018-05-02 DIAGNOSIS — Z6841 Body Mass Index (BMI) 40.0 and over, adult: Secondary | ICD-10-CM | POA: Diagnosis not present

## 2018-05-02 DIAGNOSIS — I10 Essential (primary) hypertension: Secondary | ICD-10-CM | POA: Diagnosis not present

## 2018-05-02 DIAGNOSIS — J449 Chronic obstructive pulmonary disease, unspecified: Secondary | ICD-10-CM | POA: Diagnosis not present

## 2018-05-02 DIAGNOSIS — G9009 Other idiopathic peripheral autonomic neuropathy: Secondary | ICD-10-CM | POA: Diagnosis not present

## 2018-05-02 DIAGNOSIS — E1169 Type 2 diabetes mellitus with other specified complication: Secondary | ICD-10-CM | POA: Diagnosis not present

## 2018-05-02 DIAGNOSIS — Z0000 Encounter for general adult medical examination without abnormal findings: Secondary | ICD-10-CM | POA: Diagnosis not present

## 2018-05-02 DIAGNOSIS — I5030 Unspecified diastolic (congestive) heart failure: Secondary | ICD-10-CM | POA: Diagnosis not present

## 2018-05-02 DIAGNOSIS — K219 Gastro-esophageal reflux disease without esophagitis: Secondary | ICD-10-CM | POA: Diagnosis not present

## 2018-05-02 DIAGNOSIS — R809 Proteinuria, unspecified: Secondary | ICD-10-CM | POA: Diagnosis not present

## 2018-05-02 DIAGNOSIS — I110 Hypertensive heart disease with heart failure: Secondary | ICD-10-CM | POA: Diagnosis not present

## 2018-05-02 DIAGNOSIS — M545 Low back pain: Secondary | ICD-10-CM | POA: Diagnosis not present

## 2018-05-02 IMAGING — CT CT CHEST W/ CM
2 of 3 series · 15 of 36 positions shown, 18 images · IV contrast (isovue)
Comparison: 01/27/2017

CLINICAL DATA: Pneumonia, chest pain

EXAM:
CT CHEST WITH CONTRAST
TECHNIQUE: Multidetector CT imaging of the chest was performed during
intravenous contrast administration.
CONTRAST:  75 cc Isovue 370 IV

[Series 2: axial st · axial · 0.79mm/px · z∈[+1256,+1530]mm · 12 of 161 slices shown, 15 images]
[im 12/161  mediastinal]
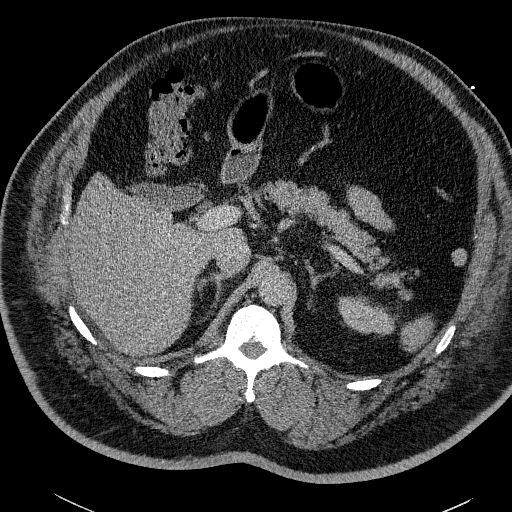
[im 12/161  lung]
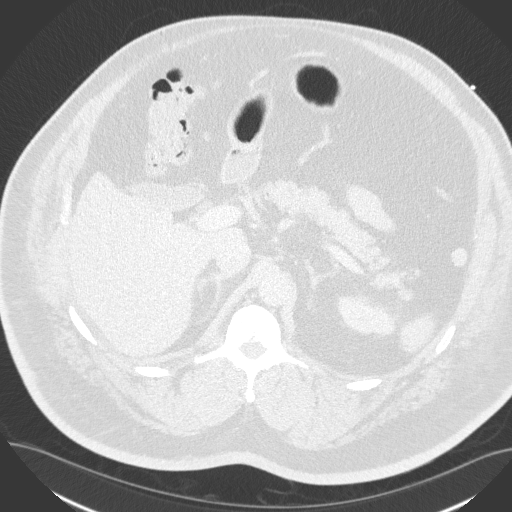
[im 24/161  lung]
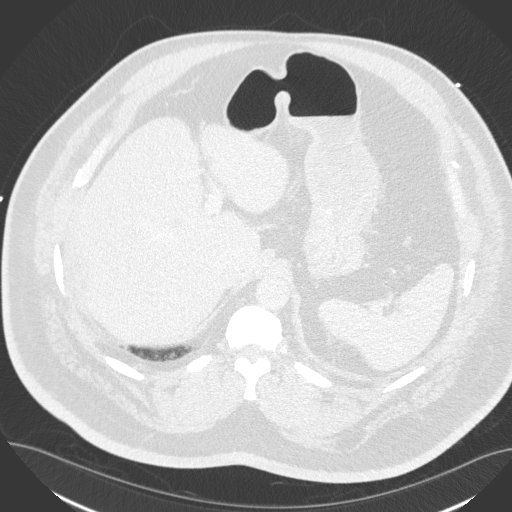
[im 36/161  lung]
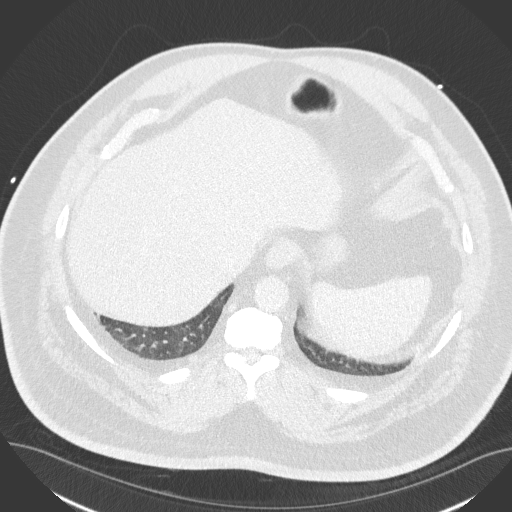
[im 48/161  lung]
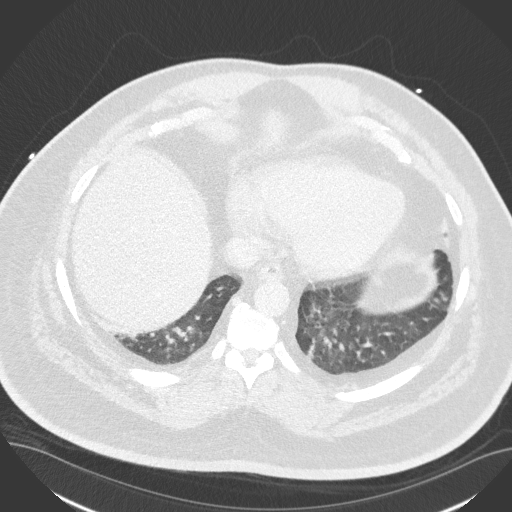
[im 60/161  mediastinal]
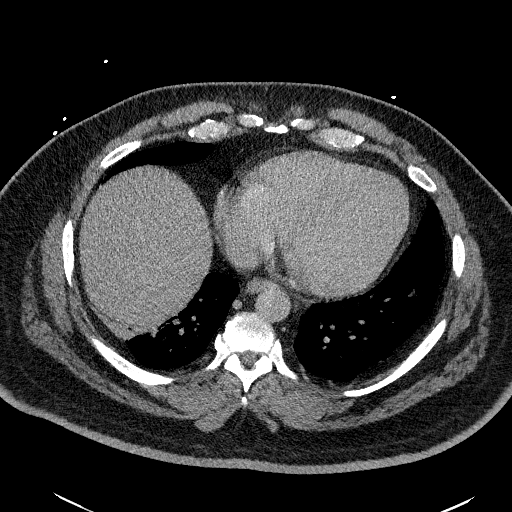
[im 60/161  lung]
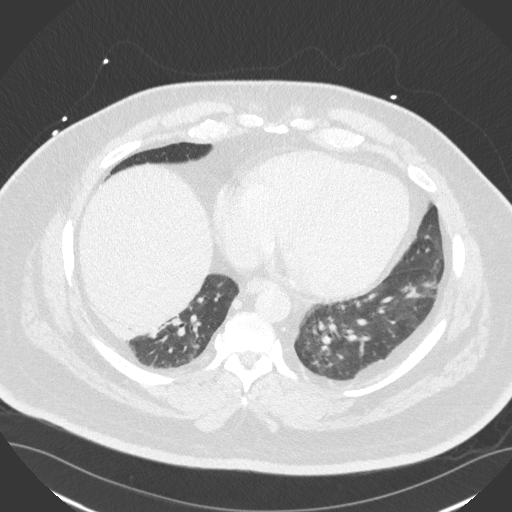
[im 72/161  lung]
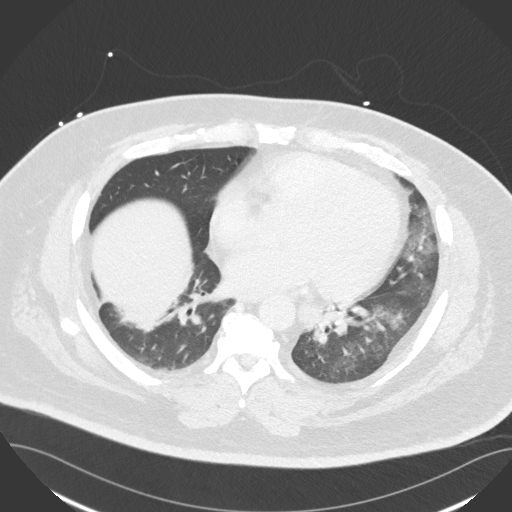
[im 89/161  lung]
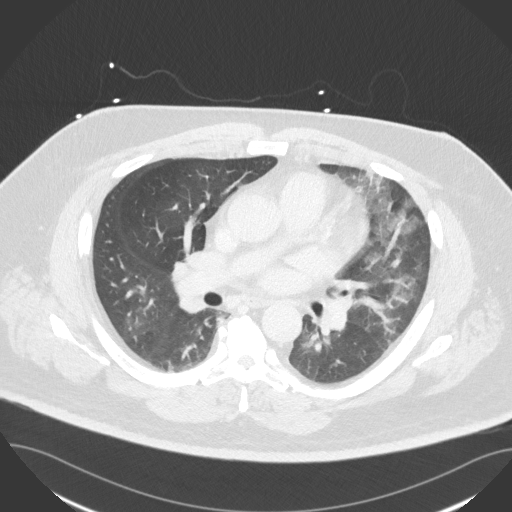
[im 101/161  lung]
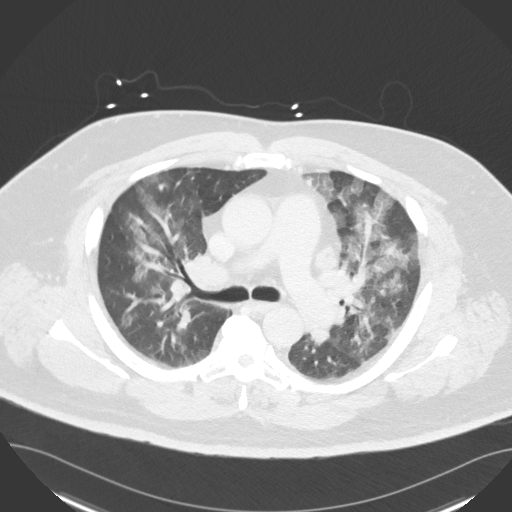
[im 113/161  mediastinal]
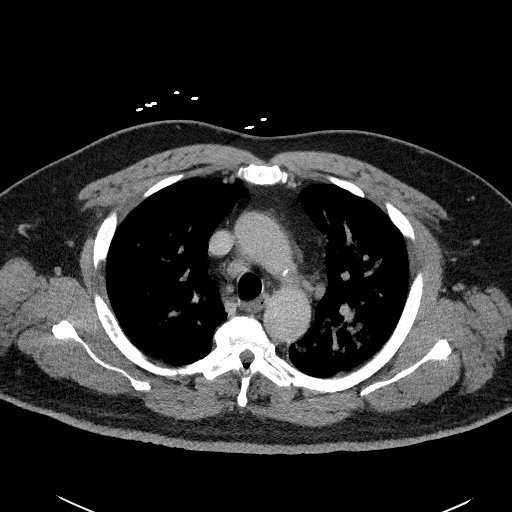
[im 113/161  lung]
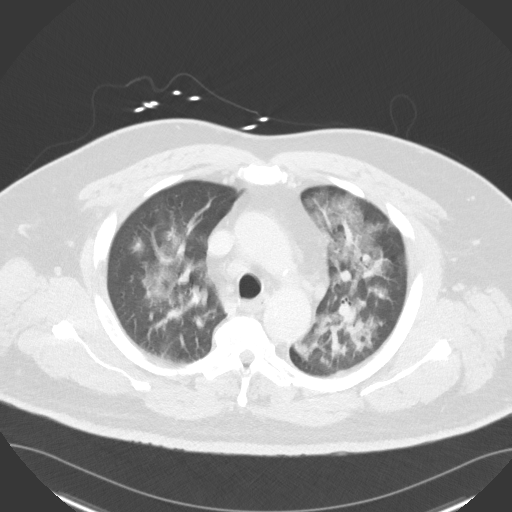
[im 125/161  lung]
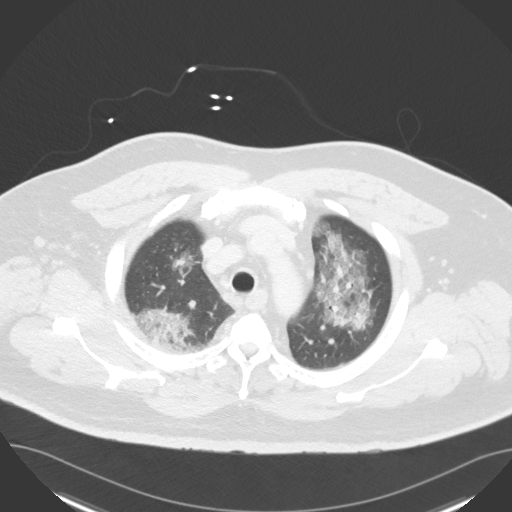
[im 137/161  lung]
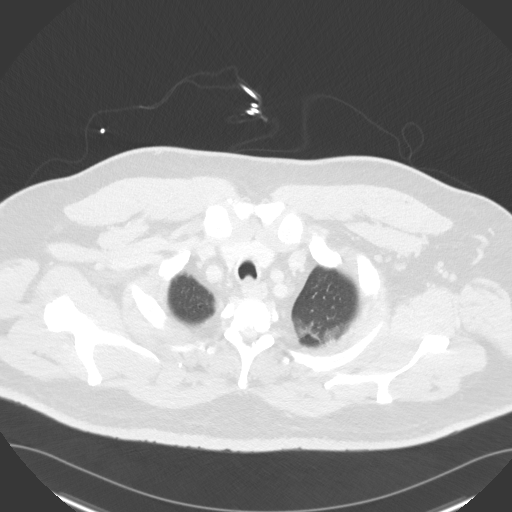
[im 149/161  lung]
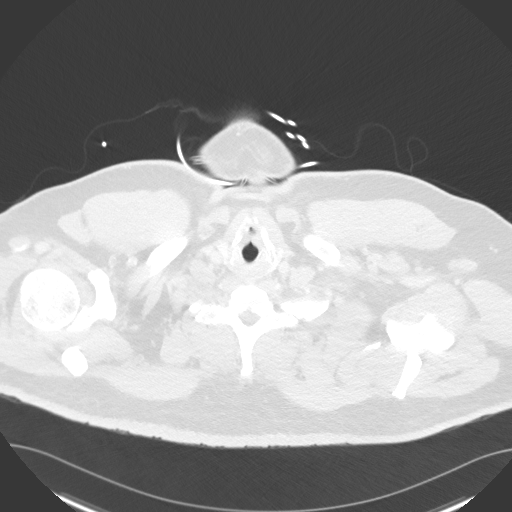

[Series 5: coronal · coronal · 0.61mm/px · 3 of 137 slices shown]
[im 28/137  lung]
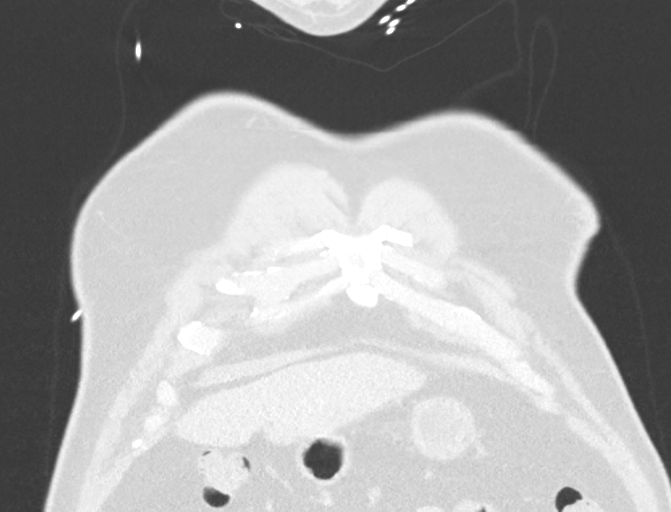
[im 55/137  lung]
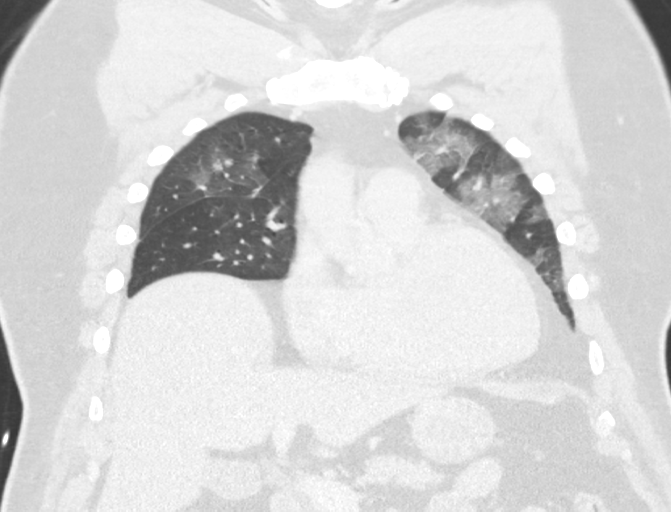
[im 82/137  lung]
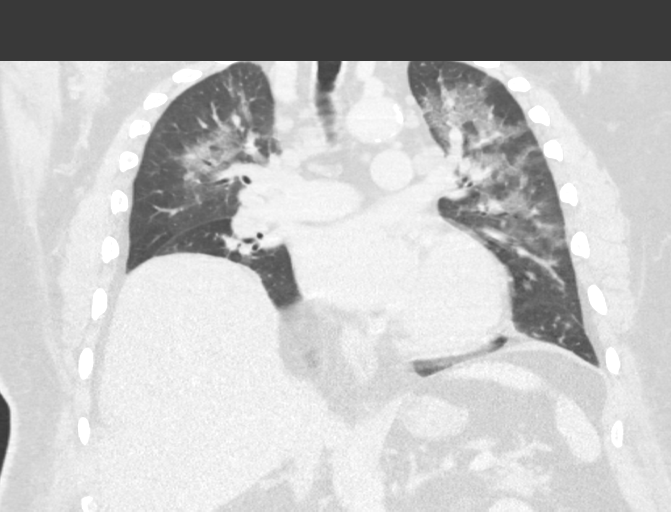

[15 of 36 positions shown; findings below may reference images not displayed]

FINDINGS: Cardiovascular: Heart is borderline in size. Aorta is normal
caliber.

Mediastinum/Nodes: Mildly enlarged mediastinal lymph nodes. These
have increased in size since prior study. Right paratracheal lymph
node has a short axis diameter of 11 mm. Inferior right paratracheal
lymph node has a short axis diameter of 13 mm. Mild bilateral hilar
adenopathy. No axillary adenopathy.

Lungs/Pleura: Extensive ground-glass opacities noted in the upper
lobes bilaterally, left greater than right as well as superior
segment of the left lower lobe. Findings most likely reflect
multifocal pneumonia. No effusions.

Upper Abdomen: Imaging into the upper abdomen shows no acute
findings.

Musculoskeletal: Chest wall soft tissues are unremarkable. No acute
bony abnormality. Degenerative spurring throughout the thoracic
spine.
IMPRESSION: Extensive ground-glass opacities in the upper lobes, left greater
than right as well as the superior segment of the left lower lobe
concerning for multifocal pneumonia. Given the upper lobe
predominance, pulmonary edema is felt less likely but also possible.

Bilateral hilar and axillary mild adenopathy, likely reactive.

## 2018-05-23 ENCOUNTER — Other Ambulatory Visit: Payer: Self-pay | Admitting: Cardiovascular Disease

## 2018-05-23 DIAGNOSIS — I10 Essential (primary) hypertension: Secondary | ICD-10-CM | POA: Diagnosis not present

## 2018-05-23 DIAGNOSIS — E782 Mixed hyperlipidemia: Secondary | ICD-10-CM | POA: Diagnosis not present

## 2018-05-23 DIAGNOSIS — I5030 Unspecified diastolic (congestive) heart failure: Secondary | ICD-10-CM | POA: Diagnosis not present

## 2018-05-23 DIAGNOSIS — E1129 Type 2 diabetes mellitus with other diabetic kidney complication: Secondary | ICD-10-CM | POA: Diagnosis not present

## 2018-05-23 DIAGNOSIS — K219 Gastro-esophageal reflux disease without esophagitis: Secondary | ICD-10-CM | POA: Diagnosis not present

## 2018-05-23 DIAGNOSIS — J449 Chronic obstructive pulmonary disease, unspecified: Secondary | ICD-10-CM | POA: Diagnosis not present

## 2018-05-23 DIAGNOSIS — E119 Type 2 diabetes mellitus without complications: Secondary | ICD-10-CM | POA: Diagnosis not present

## 2018-05-23 MED ORDER — HYDRALAZINE HCL 100 MG PO TABS: 100.0000 mg | ORAL_TABLET | Freq: Two times a day (BID) | ORAL | 3 refills | Status: DC

## 2018-05-23 MED ORDER — POTASSIUM CHLORIDE ER 10 MEQ PO TBCR: EXTENDED_RELEASE_TABLET | ORAL | 3 refills | Status: DC

## 2018-05-23 MED ORDER — OLMESARTAN MEDOXOMIL-HCTZ 40-25 MG PO TABS: 1.0000 | ORAL_TABLET | Freq: Every day | ORAL | 3 refills | Status: DC

## 2018-05-23 NOTE — Telephone Encounter (Signed)
Sent rx to [facility].

## 2018-05-23 NOTE — Telephone Encounter (Signed)
Pt is transferring to a new pharmacy and is needing his meds moved to   hydrALAZINE (APRESOLINE) 100 MG tablet [[id number]]   olmesartan-hydrochlorothiazide (BENICAR HCT) 40-25 MG tablet [[id number]]   potassium chloride (K-DUR) 10 MEQ tablet [[id number]]   [facility]  Tele # [phone] Fax # [phone]

## 2018-05-25 ENCOUNTER — Other Ambulatory Visit: Payer: Self-pay

## 2018-05-25 MED ORDER — NEBIVOLOL HCL 20 MG PO TABS: 40.0000 mg | ORAL_TABLET | Freq: Every day | ORAL | 1 refills | Status: DC

## 2018-06-04 DIAGNOSIS — E782 Mixed hyperlipidemia: Secondary | ICD-10-CM | POA: Diagnosis not present

## 2018-06-04 DIAGNOSIS — I10 Essential (primary) hypertension: Secondary | ICD-10-CM | POA: Diagnosis not present

## 2018-06-04 DIAGNOSIS — E119 Type 2 diabetes mellitus without complications: Secondary | ICD-10-CM | POA: Diagnosis not present

## 2018-06-04 DIAGNOSIS — J449 Chronic obstructive pulmonary disease, unspecified: Secondary | ICD-10-CM | POA: Diagnosis not present

## 2018-06-04 DIAGNOSIS — K219 Gastro-esophageal reflux disease without esophagitis: Secondary | ICD-10-CM | POA: Diagnosis not present

## 2018-06-04 DIAGNOSIS — E1169 Type 2 diabetes mellitus with other specified complication: Secondary | ICD-10-CM | POA: Diagnosis not present

## 2018-06-19 DIAGNOSIS — Z23 Encounter for immunization: Secondary | ICD-10-CM | POA: Diagnosis not present

## 2018-07-05 DIAGNOSIS — E119 Type 2 diabetes mellitus without complications: Secondary | ICD-10-CM | POA: Diagnosis not present

## 2018-07-05 DIAGNOSIS — I110 Hypertensive heart disease with heart failure: Secondary | ICD-10-CM | POA: Diagnosis not present

## 2018-07-05 DIAGNOSIS — I10 Essential (primary) hypertension: Secondary | ICD-10-CM | POA: Diagnosis not present

## 2018-07-05 DIAGNOSIS — E782 Mixed hyperlipidemia: Secondary | ICD-10-CM | POA: Diagnosis not present

## 2018-07-05 DIAGNOSIS — I5030 Unspecified diastolic (congestive) heart failure: Secondary | ICD-10-CM | POA: Diagnosis not present

## 2018-07-05 DIAGNOSIS — J449 Chronic obstructive pulmonary disease, unspecified: Secondary | ICD-10-CM | POA: Diagnosis not present

## 2018-07-05 DIAGNOSIS — E1129 Type 2 diabetes mellitus with other diabetic kidney complication: Secondary | ICD-10-CM | POA: Diagnosis not present

## 2018-07-05 DIAGNOSIS — J441 Chronic obstructive pulmonary disease with (acute) exacerbation: Secondary | ICD-10-CM | POA: Diagnosis not present

## 2018-07-11 ENCOUNTER — Telehealth: Payer: Self-pay | Admitting: Cardiovascular Disease

## 2018-07-11 NOTE — Telephone Encounter (Signed)
Will forward to Brittany Strader, PA. To advise.  

## 2018-07-11 NOTE — Telephone Encounter (Signed)
Per phone call from Amy at Dr. Durene Cal office pt is experiencing side effects from the cloNIDine (CATAPRES) 0.2 MG tablet [[id number]]   He's been having arm tingling and he gets dizzy, the pharmacists at Dr. Durene Cal office would like to know if there is something else he could try.   Please call Amy @ [phone]

## 2018-07-18 NOTE — Telephone Encounter (Signed)
Faxed Amy from Dr. Juel Burrow office recommendations.

## 2018-07-18 NOTE — Telephone Encounter (Signed)
    He has been on this for over 1.5 years and it looks like it was initially started due to his multiple intolerances to other medications. His SBP had actually been staying in the 180's to 190's until recently (improved to 140/80 at the time of his last office visit with [place] in 04/2018). He had self-discontinued multiple medications in the past and was actually tolerating this well at his last office visit (please fax her a copy of my last office note). Could try weaning down Clonidine to 0.1mg  BID but would not abruptly discontinue. Would encourage him to follow BP with these changes and continue on Olmesartan-HCTZ 40-25mg  daily, Hydralazine 100mg  BID (intolerant and noncompliant to TID dosing) and Bystolic 40mg  daily. If BP increases with dose reduction of Clonidine, could switch HCTZ to Chlorthalidone for improved BP effect.   Signed, Erma Heritage, PA-C 07/18/2018, 10:31 AM Pager: [phone]

## 2018-08-09 DIAGNOSIS — I5030 Unspecified diastolic (congestive) heart failure: Secondary | ICD-10-CM | POA: Diagnosis not present

## 2018-08-09 DIAGNOSIS — J449 Chronic obstructive pulmonary disease, unspecified: Secondary | ICD-10-CM | POA: Diagnosis not present

## 2018-08-09 DIAGNOSIS — E119 Type 2 diabetes mellitus without complications: Secondary | ICD-10-CM | POA: Diagnosis not present

## 2018-08-09 DIAGNOSIS — I10 Essential (primary) hypertension: Secondary | ICD-10-CM | POA: Diagnosis not present

## 2018-08-09 DIAGNOSIS — E1129 Type 2 diabetes mellitus with other diabetic kidney complication: Secondary | ICD-10-CM | POA: Diagnosis not present

## 2018-08-09 DIAGNOSIS — I110 Hypertensive heart disease with heart failure: Secondary | ICD-10-CM | POA: Diagnosis not present

## 2018-08-09 DIAGNOSIS — E782 Mixed hyperlipidemia: Secondary | ICD-10-CM | POA: Diagnosis not present

## 2018-08-09 DIAGNOSIS — J441 Chronic obstructive pulmonary disease with (acute) exacerbation: Secondary | ICD-10-CM | POA: Diagnosis not present

## 2018-08-10 DIAGNOSIS — E1169 Type 2 diabetes mellitus with other specified complication: Secondary | ICD-10-CM | POA: Diagnosis not present

## 2018-08-10 DIAGNOSIS — E782 Mixed hyperlipidemia: Secondary | ICD-10-CM | POA: Diagnosis not present

## 2018-08-10 DIAGNOSIS — E1129 Type 2 diabetes mellitus with other diabetic kidney complication: Secondary | ICD-10-CM | POA: Diagnosis not present

## 2018-08-10 DIAGNOSIS — E119 Type 2 diabetes mellitus without complications: Secondary | ICD-10-CM | POA: Diagnosis not present

## 2018-08-10 DIAGNOSIS — E1165 Type 2 diabetes mellitus with hyperglycemia: Secondary | ICD-10-CM | POA: Diagnosis not present

## 2018-08-10 DIAGNOSIS — I10 Essential (primary) hypertension: Secondary | ICD-10-CM | POA: Diagnosis not present

## 2018-08-14 DIAGNOSIS — E782 Mixed hyperlipidemia: Secondary | ICD-10-CM | POA: Diagnosis not present

## 2018-08-14 DIAGNOSIS — I10 Essential (primary) hypertension: Secondary | ICD-10-CM | POA: Diagnosis not present

## 2018-08-14 DIAGNOSIS — K219 Gastro-esophageal reflux disease without esophagitis: Secondary | ICD-10-CM | POA: Diagnosis not present

## 2018-08-14 DIAGNOSIS — I5030 Unspecified diastolic (congestive) heart failure: Secondary | ICD-10-CM | POA: Diagnosis not present

## 2018-08-14 DIAGNOSIS — G5692 Unspecified mononeuropathy of left upper limb: Secondary | ICD-10-CM | POA: Diagnosis not present

## 2018-08-14 DIAGNOSIS — J441 Chronic obstructive pulmonary disease with (acute) exacerbation: Secondary | ICD-10-CM | POA: Diagnosis not present

## 2018-08-14 DIAGNOSIS — R600 Localized edema: Secondary | ICD-10-CM | POA: Diagnosis not present

## 2018-08-14 DIAGNOSIS — R809 Proteinuria, unspecified: Secondary | ICD-10-CM | POA: Diagnosis not present

## 2018-08-14 DIAGNOSIS — N401 Enlarged prostate with lower urinary tract symptoms: Secondary | ICD-10-CM | POA: Diagnosis not present

## 2018-08-14 DIAGNOSIS — E1129 Type 2 diabetes mellitus with other diabetic kidney complication: Secondary | ICD-10-CM | POA: Diagnosis not present

## 2018-08-14 DIAGNOSIS — N400 Enlarged prostate without lower urinary tract symptoms: Secondary | ICD-10-CM | POA: Diagnosis not present

## 2018-08-14 DIAGNOSIS — J449 Chronic obstructive pulmonary disease, unspecified: Secondary | ICD-10-CM | POA: Diagnosis not present

## 2018-09-13 NOTE — Progress Notes (Signed)
Cardiology Office Note    Date:  09/17/2018   ID:  Vincent Ramos, DOB 09/20/1950, MRN [id number]  PCP:  Vincent Squibb, MD  Cardiologist: Vincent Rouge, MD    No chief complaint on file.   History of Present Illness:     68 y.o. f/u mostly for HTN.  Issues with compliance and self adjusting meds. No CAD by cath June 2019. Renal duplex with no evidence of RAS. He does walk and go to the [facility] periodically   He did self-reduce Hydralazine to 100mg  once to two times daily due to headaches after taking the medication. Has been intermittently taking extra Bystolic to account for this but says BP has been well-controlled when checked at home. Also stopped his statin due to myalgias  He is somewhat hostile about having to take Ramos much medicine for his BP. However today his ACE/diuretic bottle Was empty and his hydralazine bottle only had one pill in it. Also takes no responsibility for his lifestyle issues with Poor diet and obesity   I suspect the bystolic is the list important med he is on in terms of effectiveness Will change to labetalol and have Him f/u with his primary for BP.  His renal duplex on 04/23/18 was negative for RAS    Past Medical History:  Diagnosis Date  . Arthritis   . Asthma   . CAD (coronary artery disease)    a. 11/2017: cath showing widely patent coronary arteries with normal left main, 30% mid LAD, luminal irregularities in the proximal and mid circumflex, and 30 to 40% stenosis in the mid RCA.  Marland Kitchen CAP (community acquired pneumonia) 07/31/2014  . Enlarged prostate   . Grade II diastolic dysfunction   . Hypertension   . Pneumonia   . Pneumonia   . Shortness of breath   . Type 2 diabetes mellitus ([place])     Past Surgical History:  Procedure Laterality Date  . CIRCUMCISION    . COLONOSCOPY WITH PROPOFOL N/A 08/08/2017   Procedure: COLONOSCOPY WITH PROPOFOL;  Surgeon: Vincent Binder, MD;  [facility];  Service: Endoscopy;  Laterality: N/A;  1:00pm   . ESOPHAGEAL DILATION N/A 06/07/2017   Procedure: ESOPHAGEAL DILATION;  Surgeon: Vincent Dolin, MD;  Location: [facility];  Service: Endoscopy;  Laterality: N/A;  . ESOPHAGOGASTRODUODENOSCOPY (EGD) WITH PROPOFOL N/A 06/07/2017   Procedure: ESOPHAGOGASTRODUODENOSCOPY (EGD) WITH PROPOFOL;  Surgeon: Vincent Dolin, MD;  Location: [facility];  Service: Endoscopy;  Laterality: N/A;  . HYDROCELE EXCISION  10/14/2011   Procedure: HYDROCELECTOMY ADULT;  Surgeon: Vincent So, MD;  Location: [facility];  Service: Urology;  Laterality: Right;  . LACERATION REPAIR     left hand  . LUMBAR LAMINECTOMY/DECOMPRESSION MICRODISCECTOMY Right 10/07/2015   Procedure: Right Lumbar four-five ,lumbar five sacral-one Laminectomy;  Surgeon: Vincent Cha, MD;  Location: [facility];  Service: Neurosurgery;  Laterality: Right;  . RIGHT/LEFT HEART CATH AND CORONARY ANGIOGRAPHY N/A 12/27/2017   Procedure: RIGHT/LEFT HEART CATH AND CORONARY ANGIOGRAPHY;  Surgeon: Vincent Crome, MD;  Location: [facility];  Service: Cardiovascular;  Laterality: N/A;    Current Medications: Outpatient Medications Prior to Visit  Medication Sig Dispense Refill  . aspirin EC 81 MG tablet Take 81 mg by mouth daily.    . hydrALAZINE (APRESOLINE) 100 MG tablet Take 1 tablet (100 mg total) by mouth 2 (two) times daily. 180 tablet 3  . Insulin Degludec (TRESIBA FLEXTOUCH) 200 UNIT/ML SOPN Inject  40-50 Units into the skin daily.     . Nebivolol HCl (BYSTOLIC) 20 MG TABS Take 2 tablets (40 mg total) by mouth daily. 180 tablet 1  . olmesartan-hydrochlorothiazide (BENICAR HCT) 40-25 MG tablet Take 1 tablet by mouth daily. 90 tablet 3  . potassium chloride (K-DUR) 10 MEQ tablet Take with furosemide 90 tablet 3  . TRULICITY 1.5 SJ/6.2EZ SOPN Inject 1.5 mg into the skin every Saturday.     . cloNIDine (CATAPRES) 0.2 MG tablet Take 0.2 mg by mouth 2 (two) times daily.     Marland Kitchen acetaminophen (TYLENOL) 325 MG tablet Take 325 mg by mouth every 6  (six) hours as needed for moderate pain or headache.     No facility-administered medications prior to visit.      Allergies:   Amlodipine; Novolog [insulin aspart]; and Victoza [liraglutide]   Social History   Socioeconomic History  . Marital status: Married    Spouse name: Not on file  . Number of children: Not on file  . Years of education: Not on file  . Highest education level: Not on file  Occupational History  . Not on file  Social Needs  . Financial resource strain: Not on file  . Food insecurity:    Worry: Not on file    Inability: Not on file  . Transportation needs:    Medical: Not on file    Non-medical: Not on file  Tobacco Use  . Smoking status: Never Smoker  . Smokeless tobacco: Never Used  Substance and Sexual Activity  . Alcohol use: No    Alcohol/week: 0.0 standard drinks    Frequency: Never    Comment: rare beer  . Drug use: No  . Sexual activity: Yes    Birth control/protection: None  Lifestyle  . Physical activity:    Days per week: Not on file    Minutes per session: Not on file  . Stress: Not on file  Relationships  . Social connections:    Talks on phone: Not on file    Gets together: Not on file    Attends religious service: Not on file    Active member of club or organization: Not on file    Attends meetings of clubs or organizations: Not on file    Relationship status: Not on file  Other Topics Concern  . Not on file  Social History Narrative  . Not on file     Family History:  The patient's family history includes CAD in his unknown relative; Cancer in his unknown relative; Diabetes in his sister and unknown relative; Hypertension in his sister and unknown relative.   Review of Systems:   Please see the history of present illness.     General:  No chills, fever, night sweats or weight changes.  Cardiovascular:  No chest pain, dyspnea on exertion, edema, orthopnea, palpitations, paroxysmal nocturnal dyspnea. Dermatological: No  rash, lesions/masses Respiratory: No cough, dyspnea Urologic: No hematuria, dysuria Abdominal:   No nausea, vomiting, diarrhea, bright red blood per rectum, melena, or hematemesis Neurologic:  No visual changes, wkns, changes in mental status. Positive for occasional headaches.   All other systems reviewed and are otherwise negative except as noted above.   Physical Exam:    VS:  BP (!) 186/80   Pulse 72   Ht 5' 6.5" (1.689 m)   Wt 125.6 kg   SpO2 95%   BMI 44.04 kg/m    Affect appropriate Morbidly obese black male  HEENT: normal  Neck supple with no adenopathy JVP normal no bruits no thyromegaly Lungs clear with no wheezing and good diaphragmatic motion Heart:  S1/S2 no murmur, no rub, gallop or click PMI normal Abdomen: benighn, BS positve, no tenderness, no AAA no bruit.  No HSM or HJR Distal pulses intact with no bruits Trace LE  edema Neuro non-focal Skin warm and dry No muscular weakness   Wt Readings from Last 3 Encounters:  09/17/18 125.6 kg  04/25/18 125.6 kg  03/08/18 122.9 kg     Studies/Labs Reviewed:   EKG:  EKG is not ordered today.    Recent Labs: 09/17/2017: ALT 39; Magnesium 1.9 02/03/2018: B Natriuretic Peptide 88.0; BUN 12; Creatinine, Ser 1.17; Hemoglobin 13.5; Platelets 169; Potassium 3.6; Sodium 138   Lipid Panel    Component Value Date/Time   CHOL 193 01/04/2013 0804   TRIG 129 01/04/2013 0804   HDL 45 01/04/2013 0804   CHOLHDL 4.3 01/04/2013 0804   VLDL 26 01/04/2013 0804   LDLCALC 122 (H) 01/04/2013 0804    Additional studies/ records that were reviewed today include:   Renal Dopplers: 04/17/2018 Renal:  Right: Normal right Resisitive Index. Normal cortical thickness of    right kidney. No evidence of right renal artery stenosis. RRV    flow present. Normal size right kidney. Left: Normal size of left kidney. Normal left Resistive Index.    Normal cortical thickness of the left kidney. No evidence of    left  renal artery stenosis.  Cardiac Catheterization: 11/2017  Widely patent coronary arteries with normal left main, 30% mid LAD, luminal irregularities in the proximal and mid circumflex, and 30 to 40% stenosis in the mid RCA.  Mild global left ventricular systolic dysfunction with EF 40 to 45%.  Severely elevated end-diastolic pressure consistent with chronic combined systolic and diastolic heart failure.  Severe poorly controlled blood pressure during the procedure.  Severe BP elevation during the procedure requiring IV hydralazine, labetalol, and IV nitroglycerin drip.  Stable right femoral after Mynx arteriotomy closure device.  RECOMMENDATIONS:   IV Lasix x1 and more aggressive outpatient diuresis  Guideline directed therapy for systolic/diastolic heart failure.  Assessment:    1. Essential hypertension      Plan:   In order of problems listed above:  1. Essential HTN -  Issues with compliance and lifestyle and weight change bystolic to labetalol 976 bid f/u with Dr Nevada Crane for his BP  2. CAD - cardiac catheterization in 11/2017 showed mild nonobstructive CAD as outlined above. He denies any recent chest pain or dyspnea on exertion. - Continue on ASA and beta-blocker therapy.  4. HLD - Followed by PCP. Stopped statin on his own due to myalgias   Patient does not need to be seen by cardiology for his BP this can be done by his primary  F/U PRN    Vincent Ramos

## 2018-09-17 ENCOUNTER — Ambulatory Visit: Payer: PPO | Admitting: Cardiovascular Disease

## 2018-09-17 ENCOUNTER — Encounter: Payer: Self-pay | Admitting: Cardiovascular Disease

## 2018-09-17 VITALS — BP 186/80 | HR 72 | Ht 66.5 in | Wt 277.0 lb

## 2018-09-17 DIAGNOSIS — I10 Essential (primary) hypertension: Secondary | ICD-10-CM

## 2018-09-17 MED ORDER — LABETALOL HCL 200 MG PO TABS: 200.0000 mg | ORAL_TABLET | Freq: Two times a day (BID) | ORAL | 3 refills | Status: DC

## 2018-09-17 NOTE — Patient Instructions (Addendum)
Medication Instructions:  Your physician has recommended you make the following change in your medication:  Stop Taking Clonidine  Start Labetalol 200 mg Two Times Daily   If you need a refill on your cardiac medications before your next appointment, please call your pharmacy.   Lab work: NONE  If you have labs (blood work) drawn today and your tests are completely normal, you will receive your results only by: Marland Kitchen MyChart Message (if you have MyChart) OR . A paper copy in the mail If you have any lab test that is abnormal or we need to change your treatment, we will call you to review the results.  Testing/Procedures: NONE   Follow-Up: At [facility], you and your health needs are our priority.  As part of our continuing mission to provide you with exceptional heart care, we have created designated Provider Care Teams.  These Care Teams include your primary Cardiologist (physician) and Advanced Practice Providers (APPs -  Physician Assistants and Nurse Practitioners) who all work together to provide you with the care you need, when you need it. You will need a follow up appointment in 1 years.  Please call our office 2 months in advance to schedule this appointment.  You may see [name], MD or one of the following Advanced Practice Providers on your designated Care Team:   Bernerd Pho, PA-C [facility]) . Ermalinda Barrios, PA-C ([place])  Any Other Special Instructions Will Be Listed Below (If Applicable). Thank you for choosing [facility]!

## 2018-10-08 DIAGNOSIS — J441 Chronic obstructive pulmonary disease with (acute) exacerbation: Secondary | ICD-10-CM | POA: Diagnosis not present

## 2018-10-08 DIAGNOSIS — I10 Essential (primary) hypertension: Secondary | ICD-10-CM | POA: Diagnosis not present

## 2018-10-08 DIAGNOSIS — J449 Chronic obstructive pulmonary disease, unspecified: Secondary | ICD-10-CM | POA: Diagnosis not present

## 2018-10-08 DIAGNOSIS — K219 Gastro-esophageal reflux disease without esophagitis: Secondary | ICD-10-CM | POA: Diagnosis not present

## 2018-10-08 DIAGNOSIS — G894 Chronic pain syndrome: Secondary | ICD-10-CM | POA: Diagnosis not present

## 2018-10-08 DIAGNOSIS — E1169 Type 2 diabetes mellitus with other specified complication: Secondary | ICD-10-CM | POA: Diagnosis not present

## 2018-10-08 DIAGNOSIS — E782 Mixed hyperlipidemia: Secondary | ICD-10-CM | POA: Diagnosis not present

## 2018-10-08 DIAGNOSIS — E119 Type 2 diabetes mellitus without complications: Secondary | ICD-10-CM | POA: Diagnosis not present

## 2018-10-08 DIAGNOSIS — E1129 Type 2 diabetes mellitus with other diabetic kidney complication: Secondary | ICD-10-CM | POA: Diagnosis not present

## 2018-10-08 DIAGNOSIS — I110 Hypertensive heart disease with heart failure: Secondary | ICD-10-CM | POA: Diagnosis not present

## 2018-10-08 DIAGNOSIS — I5030 Unspecified diastolic (congestive) heart failure: Secondary | ICD-10-CM | POA: Diagnosis not present

## 2018-10-09 ENCOUNTER — Other Ambulatory Visit: Payer: Self-pay | Admitting: *Deleted

## 2018-10-09 ENCOUNTER — Telehealth: Payer: Self-pay | Admitting: Internal Medicine

## 2018-10-09 MED ORDER — LABETALOL HCL 200 MG PO TABS: 200.0000 mg | ORAL_TABLET | Freq: Two times a day (BID) | ORAL | 3 refills | Status: DC

## 2018-10-09 NOTE — Telephone Encounter (Signed)
Amy @ [facility] wants to make sure his pharmacy has been changed.

## 2018-10-12 ENCOUNTER — Ambulatory Visit: Payer: PPO | Admitting: Urology

## 2018-10-12 DIAGNOSIS — N3941 Urge incontinence: Secondary | ICD-10-CM | POA: Diagnosis not present

## 2018-10-12 DIAGNOSIS — N5201 Erectile dysfunction due to arterial insufficiency: Secondary | ICD-10-CM

## 2018-10-12 DIAGNOSIS — E291 Testicular hypofunction: Secondary | ICD-10-CM | POA: Diagnosis not present

## 2018-10-12 DIAGNOSIS — N400 Enlarged prostate without lower urinary tract symptoms: Secondary | ICD-10-CM

## 2018-10-12 DIAGNOSIS — N430 Encysted hydrocele: Secondary | ICD-10-CM

## 2018-10-15 DIAGNOSIS — J449 Chronic obstructive pulmonary disease, unspecified: Secondary | ICD-10-CM | POA: Diagnosis not present

## 2018-10-15 DIAGNOSIS — G4733 Obstructive sleep apnea (adult) (pediatric): Secondary | ICD-10-CM | POA: Diagnosis not present

## 2018-10-15 DIAGNOSIS — I10 Essential (primary) hypertension: Secondary | ICD-10-CM | POA: Diagnosis not present

## 2018-10-15 DIAGNOSIS — E669 Obesity, unspecified: Secondary | ICD-10-CM | POA: Diagnosis not present

## 2018-10-29 ENCOUNTER — Telehealth: Payer: Self-pay | Admitting: Cardiovascular Disease

## 2018-10-29 NOTE — Telephone Encounter (Signed)
Per phone call from [facility] at Dr. Juel Burrow office pt has started to have SOB since he began taking the labetalol (NORMODYNE) 200 MG tablet [[id number]]

## 2018-10-29 NOTE — Telephone Encounter (Signed)
Spoke with pt who states that after starting Labetalol he started to become SOB and "jittery". Patients states that he stopped taking labetalol for a few days and his SOB became better. Pt states he spoke with his insurance nurse that told him not to take the medication anymore. Pt could not give current BP but states that it is up. Please advise.

## 2018-10-29 NOTE — Telephone Encounter (Signed)
Go back to taking his bystolic and f/u with Dr Nevada Crane

## 2018-10-30 MED ORDER — NEBIVOLOL HCL 20 MG PO TABS: 40.0000 mg | ORAL_TABLET | Freq: Every day | ORAL | 1 refills | Status: DC

## 2018-10-30 NOTE — Telephone Encounter (Signed)
Pt notified and voiced understanding 

## 2018-10-31 DIAGNOSIS — I110 Hypertensive heart disease with heart failure: Secondary | ICD-10-CM | POA: Diagnosis not present

## 2018-10-31 DIAGNOSIS — E1129 Type 2 diabetes mellitus with other diabetic kidney complication: Secondary | ICD-10-CM | POA: Diagnosis not present

## 2018-10-31 DIAGNOSIS — K219 Gastro-esophageal reflux disease without esophagitis: Secondary | ICD-10-CM | POA: Diagnosis not present

## 2018-10-31 DIAGNOSIS — E119 Type 2 diabetes mellitus without complications: Secondary | ICD-10-CM | POA: Diagnosis not present

## 2018-10-31 DIAGNOSIS — J441 Chronic obstructive pulmonary disease with (acute) exacerbation: Secondary | ICD-10-CM | POA: Diagnosis not present

## 2018-10-31 DIAGNOSIS — J449 Chronic obstructive pulmonary disease, unspecified: Secondary | ICD-10-CM | POA: Diagnosis not present

## 2018-10-31 DIAGNOSIS — E1169 Type 2 diabetes mellitus with other specified complication: Secondary | ICD-10-CM | POA: Diagnosis not present

## 2018-10-31 DIAGNOSIS — I5030 Unspecified diastolic (congestive) heart failure: Secondary | ICD-10-CM | POA: Diagnosis not present

## 2018-10-31 DIAGNOSIS — E782 Mixed hyperlipidemia: Secondary | ICD-10-CM | POA: Diagnosis not present

## 2018-10-31 DIAGNOSIS — I10 Essential (primary) hypertension: Secondary | ICD-10-CM | POA: Diagnosis not present

## 2018-10-31 DIAGNOSIS — G894 Chronic pain syndrome: Secondary | ICD-10-CM | POA: Diagnosis not present

## 2018-11-12 ENCOUNTER — Other Ambulatory Visit: Payer: Self-pay

## 2018-11-12 MED ORDER — NEBIVOLOL HCL 20 MG PO TABS: 40.0000 mg | ORAL_TABLET | Freq: Every day | ORAL | 1 refills | Status: DC

## 2018-11-12 NOTE — Telephone Encounter (Signed)
Pharmacy changed to [facility], refilled bystolic

## 2018-11-23 DIAGNOSIS — N401 Enlarged prostate with lower urinary tract symptoms: Secondary | ICD-10-CM | POA: Diagnosis not present

## 2018-11-26 DIAGNOSIS — E1165 Type 2 diabetes mellitus with hyperglycemia: Secondary | ICD-10-CM | POA: Diagnosis not present

## 2018-11-26 DIAGNOSIS — E1129 Type 2 diabetes mellitus with other diabetic kidney complication: Secondary | ICD-10-CM | POA: Diagnosis not present

## 2018-11-26 DIAGNOSIS — I10 Essential (primary) hypertension: Secondary | ICD-10-CM | POA: Diagnosis not present

## 2018-11-26 DIAGNOSIS — E1169 Type 2 diabetes mellitus with other specified complication: Secondary | ICD-10-CM | POA: Diagnosis not present

## 2018-11-26 DIAGNOSIS — E782 Mixed hyperlipidemia: Secondary | ICD-10-CM | POA: Diagnosis not present

## 2018-11-29 DIAGNOSIS — I5030 Unspecified diastolic (congestive) heart failure: Secondary | ICD-10-CM | POA: Diagnosis not present

## 2018-11-29 DIAGNOSIS — R809 Proteinuria, unspecified: Secondary | ICD-10-CM | POA: Diagnosis not present

## 2018-11-29 DIAGNOSIS — N400 Enlarged prostate without lower urinary tract symptoms: Secondary | ICD-10-CM | POA: Diagnosis not present

## 2018-11-29 DIAGNOSIS — J449 Chronic obstructive pulmonary disease, unspecified: Secondary | ICD-10-CM | POA: Diagnosis not present

## 2018-11-29 DIAGNOSIS — M545 Low back pain: Secondary | ICD-10-CM | POA: Diagnosis not present

## 2018-11-29 DIAGNOSIS — G9009 Other idiopathic peripheral autonomic neuropathy: Secondary | ICD-10-CM | POA: Diagnosis not present

## 2018-11-29 DIAGNOSIS — G8929 Other chronic pain: Secondary | ICD-10-CM | POA: Diagnosis not present

## 2018-11-29 DIAGNOSIS — E1169 Type 2 diabetes mellitus with other specified complication: Secondary | ICD-10-CM | POA: Diagnosis not present

## 2018-11-29 DIAGNOSIS — Z0000 Encounter for general adult medical examination without abnormal findings: Secondary | ICD-10-CM | POA: Diagnosis not present

## 2018-11-29 DIAGNOSIS — E782 Mixed hyperlipidemia: Secondary | ICD-10-CM | POA: Diagnosis not present

## 2018-12-10 DIAGNOSIS — E1169 Type 2 diabetes mellitus with other specified complication: Secondary | ICD-10-CM | POA: Diagnosis not present

## 2018-12-10 DIAGNOSIS — J449 Chronic obstructive pulmonary disease, unspecified: Secondary | ICD-10-CM | POA: Diagnosis not present

## 2018-12-10 DIAGNOSIS — E782 Mixed hyperlipidemia: Secondary | ICD-10-CM | POA: Diagnosis not present

## 2019-01-07 DIAGNOSIS — E782 Mixed hyperlipidemia: Secondary | ICD-10-CM | POA: Diagnosis not present

## 2019-01-07 DIAGNOSIS — E119 Type 2 diabetes mellitus without complications: Secondary | ICD-10-CM | POA: Diagnosis not present

## 2019-01-07 DIAGNOSIS — E1129 Type 2 diabetes mellitus with other diabetic kidney complication: Secondary | ICD-10-CM | POA: Diagnosis not present

## 2019-01-07 DIAGNOSIS — E1169 Type 2 diabetes mellitus with other specified complication: Secondary | ICD-10-CM | POA: Diagnosis not present

## 2019-01-07 DIAGNOSIS — J441 Chronic obstructive pulmonary disease with (acute) exacerbation: Secondary | ICD-10-CM | POA: Diagnosis not present

## 2019-01-07 DIAGNOSIS — I110 Hypertensive heart disease with heart failure: Secondary | ICD-10-CM | POA: Diagnosis not present

## 2019-01-07 DIAGNOSIS — G894 Chronic pain syndrome: Secondary | ICD-10-CM | POA: Diagnosis not present

## 2019-01-07 DIAGNOSIS — J449 Chronic obstructive pulmonary disease, unspecified: Secondary | ICD-10-CM | POA: Diagnosis not present

## 2019-01-07 DIAGNOSIS — I10 Essential (primary) hypertension: Secondary | ICD-10-CM | POA: Diagnosis not present

## 2019-01-07 DIAGNOSIS — I5030 Unspecified diastolic (congestive) heart failure: Secondary | ICD-10-CM | POA: Diagnosis not present

## 2019-01-07 DIAGNOSIS — K219 Gastro-esophageal reflux disease without esophagitis: Secondary | ICD-10-CM | POA: Diagnosis not present

## 2019-01-14 DIAGNOSIS — N401 Enlarged prostate with lower urinary tract symptoms: Secondary | ICD-10-CM | POA: Diagnosis not present

## 2019-01-21 DIAGNOSIS — N5201 Erectile dysfunction due to arterial insufficiency: Secondary | ICD-10-CM | POA: Diagnosis not present

## 2019-01-21 DIAGNOSIS — E291 Testicular hypofunction: Secondary | ICD-10-CM | POA: Diagnosis not present

## 2019-01-22 ENCOUNTER — Encounter: Payer: Self-pay | Admitting: Gastroenterology

## 2019-01-29 DIAGNOSIS — E291 Testicular hypofunction: Secondary | ICD-10-CM | POA: Diagnosis not present

## 2019-02-01 DIAGNOSIS — I110 Hypertensive heart disease with heart failure: Secondary | ICD-10-CM | POA: Diagnosis not present

## 2019-02-01 DIAGNOSIS — J449 Chronic obstructive pulmonary disease, unspecified: Secondary | ICD-10-CM | POA: Diagnosis not present

## 2019-02-01 DIAGNOSIS — G894 Chronic pain syndrome: Secondary | ICD-10-CM | POA: Diagnosis not present

## 2019-02-01 DIAGNOSIS — I5030 Unspecified diastolic (congestive) heart failure: Secondary | ICD-10-CM | POA: Diagnosis not present

## 2019-02-01 DIAGNOSIS — E782 Mixed hyperlipidemia: Secondary | ICD-10-CM | POA: Diagnosis not present

## 2019-02-01 DIAGNOSIS — E119 Type 2 diabetes mellitus without complications: Secondary | ICD-10-CM | POA: Diagnosis not present

## 2019-02-01 DIAGNOSIS — J441 Chronic obstructive pulmonary disease with (acute) exacerbation: Secondary | ICD-10-CM | POA: Diagnosis not present

## 2019-02-01 DIAGNOSIS — E1129 Type 2 diabetes mellitus with other diabetic kidney complication: Secondary | ICD-10-CM | POA: Diagnosis not present

## 2019-02-01 DIAGNOSIS — I10 Essential (primary) hypertension: Secondary | ICD-10-CM | POA: Diagnosis not present

## 2019-02-01 DIAGNOSIS — E1169 Type 2 diabetes mellitus with other specified complication: Secondary | ICD-10-CM | POA: Diagnosis not present

## 2019-02-01 DIAGNOSIS — K219 Gastro-esophageal reflux disease without esophagitis: Secondary | ICD-10-CM | POA: Diagnosis not present

## 2019-02-14 DIAGNOSIS — I10 Essential (primary) hypertension: Secondary | ICD-10-CM | POA: Diagnosis not present

## 2019-02-14 DIAGNOSIS — N401 Enlarged prostate with lower urinary tract symptoms: Secondary | ICD-10-CM | POA: Diagnosis not present

## 2019-02-14 DIAGNOSIS — J449 Chronic obstructive pulmonary disease, unspecified: Secondary | ICD-10-CM | POA: Diagnosis not present

## 2019-02-14 DIAGNOSIS — G4733 Obstructive sleep apnea (adult) (pediatric): Secondary | ICD-10-CM | POA: Diagnosis not present

## 2019-02-25 DIAGNOSIS — E291 Testicular hypofunction: Secondary | ICD-10-CM | POA: Diagnosis not present

## 2019-02-28 DIAGNOSIS — R05 Cough: Secondary | ICD-10-CM | POA: Diagnosis not present

## 2019-02-28 DIAGNOSIS — J309 Allergic rhinitis, unspecified: Secondary | ICD-10-CM | POA: Diagnosis not present

## 2019-02-28 DIAGNOSIS — R0982 Postnasal drip: Secondary | ICD-10-CM | POA: Diagnosis not present

## 2019-03-04 ENCOUNTER — Other Ambulatory Visit: Payer: Self-pay | Admitting: Student

## 2019-03-08 DIAGNOSIS — E1169 Type 2 diabetes mellitus with other specified complication: Secondary | ICD-10-CM | POA: Diagnosis not present

## 2019-03-08 DIAGNOSIS — J441 Chronic obstructive pulmonary disease with (acute) exacerbation: Secondary | ICD-10-CM | POA: Diagnosis not present

## 2019-03-08 DIAGNOSIS — I110 Hypertensive heart disease with heart failure: Secondary | ICD-10-CM | POA: Diagnosis not present

## 2019-03-08 DIAGNOSIS — G894 Chronic pain syndrome: Secondary | ICD-10-CM | POA: Diagnosis not present

## 2019-03-08 DIAGNOSIS — I10 Essential (primary) hypertension: Secondary | ICD-10-CM | POA: Diagnosis not present

## 2019-03-08 DIAGNOSIS — I5030 Unspecified diastolic (congestive) heart failure: Secondary | ICD-10-CM | POA: Diagnosis not present

## 2019-03-08 DIAGNOSIS — K219 Gastro-esophageal reflux disease without esophagitis: Secondary | ICD-10-CM | POA: Diagnosis not present

## 2019-03-08 DIAGNOSIS — J449 Chronic obstructive pulmonary disease, unspecified: Secondary | ICD-10-CM | POA: Diagnosis not present

## 2019-03-08 DIAGNOSIS — E1129 Type 2 diabetes mellitus with other diabetic kidney complication: Secondary | ICD-10-CM | POA: Diagnosis not present

## 2019-03-08 DIAGNOSIS — E119 Type 2 diabetes mellitus without complications: Secondary | ICD-10-CM | POA: Diagnosis not present

## 2019-03-08 DIAGNOSIS — E782 Mixed hyperlipidemia: Secondary | ICD-10-CM | POA: Diagnosis not present

## 2019-04-05 DIAGNOSIS — I5030 Unspecified diastolic (congestive) heart failure: Secondary | ICD-10-CM | POA: Diagnosis not present

## 2019-04-05 DIAGNOSIS — G894 Chronic pain syndrome: Secondary | ICD-10-CM | POA: Diagnosis not present

## 2019-04-05 DIAGNOSIS — J449 Chronic obstructive pulmonary disease, unspecified: Secondary | ICD-10-CM | POA: Diagnosis not present

## 2019-04-05 DIAGNOSIS — I110 Hypertensive heart disease with heart failure: Secondary | ICD-10-CM | POA: Diagnosis not present

## 2019-04-05 DIAGNOSIS — K219 Gastro-esophageal reflux disease without esophagitis: Secondary | ICD-10-CM | POA: Diagnosis not present

## 2019-04-05 DIAGNOSIS — J441 Chronic obstructive pulmonary disease with (acute) exacerbation: Secondary | ICD-10-CM | POA: Diagnosis not present

## 2019-04-05 DIAGNOSIS — E1169 Type 2 diabetes mellitus with other specified complication: Secondary | ICD-10-CM | POA: Diagnosis not present

## 2019-04-05 DIAGNOSIS — E782 Mixed hyperlipidemia: Secondary | ICD-10-CM | POA: Diagnosis not present

## 2019-04-05 DIAGNOSIS — E1129 Type 2 diabetes mellitus with other diabetic kidney complication: Secondary | ICD-10-CM | POA: Diagnosis not present

## 2019-04-05 DIAGNOSIS — E119 Type 2 diabetes mellitus without complications: Secondary | ICD-10-CM | POA: Diagnosis not present

## 2019-04-05 DIAGNOSIS — I10 Essential (primary) hypertension: Secondary | ICD-10-CM | POA: Diagnosis not present

## 2019-04-16 DIAGNOSIS — E291 Testicular hypofunction: Secondary | ICD-10-CM | POA: Diagnosis not present

## 2019-04-22 DIAGNOSIS — E291 Testicular hypofunction: Secondary | ICD-10-CM | POA: Diagnosis not present

## 2019-04-22 DIAGNOSIS — N3941 Urge incontinence: Secondary | ICD-10-CM | POA: Diagnosis not present

## 2019-04-22 DIAGNOSIS — N5201 Erectile dysfunction due to arterial insufficiency: Secondary | ICD-10-CM | POA: Diagnosis not present

## 2019-04-22 DIAGNOSIS — N401 Enlarged prostate with lower urinary tract symptoms: Secondary | ICD-10-CM | POA: Diagnosis not present

## 2019-05-08 ENCOUNTER — Encounter (HOSPITAL_COMMUNITY): Payer: Self-pay | Admitting: Emergency Medicine

## 2019-05-08 ENCOUNTER — Emergency Department (HOSPITAL_COMMUNITY): Payer: PPO

## 2019-05-08 ENCOUNTER — Inpatient Hospital Stay (HOSPITAL_COMMUNITY)
Admission: EM | Admit: 2019-05-08 | Discharge: 2019-05-12 | DRG: 389 | Disposition: A | Discharge status: Home / Self Care | Payer: PPO | Attending: Family Medicine | Admitting: Family Medicine

## 2019-05-08 ENCOUNTER — Other Ambulatory Visit: Payer: Self-pay

## 2019-05-08 DIAGNOSIS — Z888 Allergy status to other drugs, medicaments and biological substances status: Secondary | ICD-10-CM | POA: Diagnosis not present

## 2019-05-08 DIAGNOSIS — E782 Mixed hyperlipidemia: Secondary | ICD-10-CM | POA: Diagnosis not present

## 2019-05-08 DIAGNOSIS — Z6841 Body Mass Index (BMI) 40.0 and over, adult: Secondary | ICD-10-CM

## 2019-05-08 DIAGNOSIS — R109 Unspecified abdominal pain: Secondary | ICD-10-CM | POA: Diagnosis present

## 2019-05-08 DIAGNOSIS — R824 Acetonuria: Secondary | ICD-10-CM | POA: Diagnosis not present

## 2019-05-08 DIAGNOSIS — I5030 Unspecified diastolic (congestive) heart failure: Secondary | ICD-10-CM | POA: Diagnosis not present

## 2019-05-08 DIAGNOSIS — Z794 Long term (current) use of insulin: Secondary | ICD-10-CM | POA: Diagnosis not present

## 2019-05-08 DIAGNOSIS — J449 Chronic obstructive pulmonary disease, unspecified: Secondary | ICD-10-CM | POA: Diagnosis not present

## 2019-05-08 DIAGNOSIS — K56609 Unspecified intestinal obstruction, unspecified as to partial versus complete obstruction: Secondary | ICD-10-CM

## 2019-05-08 DIAGNOSIS — Z7982 Long term (current) use of aspirin: Secondary | ICD-10-CM | POA: Diagnosis not present

## 2019-05-08 DIAGNOSIS — K219 Gastro-esophageal reflux disease without esophagitis: Secondary | ICD-10-CM | POA: Diagnosis not present

## 2019-05-08 DIAGNOSIS — I2510 Atherosclerotic heart disease of native coronary artery without angina pectoris: Secondary | ICD-10-CM | POA: Diagnosis not present

## 2019-05-08 DIAGNOSIS — Z20828 Contact with and (suspected) exposure to other viral communicable diseases: Secondary | ICD-10-CM | POA: Diagnosis not present

## 2019-05-08 DIAGNOSIS — K5900 Constipation, unspecified: Secondary | ICD-10-CM | POA: Diagnosis present

## 2019-05-08 DIAGNOSIS — E1169 Type 2 diabetes mellitus with other specified complication: Secondary | ICD-10-CM | POA: Diagnosis not present

## 2019-05-08 DIAGNOSIS — R188 Other ascites: Secondary | ICD-10-CM | POA: Diagnosis not present

## 2019-05-08 DIAGNOSIS — Z03818 Encounter for observation for suspected exposure to other biological agents ruled out: Secondary | ICD-10-CM | POA: Diagnosis not present

## 2019-05-08 DIAGNOSIS — E1129 Type 2 diabetes mellitus with other diabetic kidney complication: Secondary | ICD-10-CM | POA: Diagnosis not present

## 2019-05-08 DIAGNOSIS — I5032 Chronic diastolic (congestive) heart failure: Secondary | ICD-10-CM | POA: Diagnosis not present

## 2019-05-08 DIAGNOSIS — E119 Type 2 diabetes mellitus without complications: Secondary | ICD-10-CM | POA: Diagnosis not present

## 2019-05-08 DIAGNOSIS — R809 Proteinuria, unspecified: Secondary | ICD-10-CM | POA: Diagnosis present

## 2019-05-08 DIAGNOSIS — I519 Heart disease, unspecified: Secondary | ICD-10-CM | POA: Diagnosis present

## 2019-05-08 DIAGNOSIS — K56699 Other intestinal obstruction unspecified as to partial versus complete obstruction: Secondary | ICD-10-CM | POA: Diagnosis not present

## 2019-05-08 DIAGNOSIS — Z833 Family history of diabetes mellitus: Secondary | ICD-10-CM

## 2019-05-08 DIAGNOSIS — K56601 Complete intestinal obstruction, unspecified as to cause: Secondary | ICD-10-CM

## 2019-05-08 DIAGNOSIS — Z9861 Coronary angioplasty status: Secondary | ICD-10-CM | POA: Diagnosis not present

## 2019-05-08 DIAGNOSIS — M1990 Unspecified osteoarthritis, unspecified site: Secondary | ICD-10-CM | POA: Diagnosis present

## 2019-05-08 DIAGNOSIS — I110 Hypertensive heart disease with heart failure: Secondary | ICD-10-CM | POA: Diagnosis present

## 2019-05-08 DIAGNOSIS — E669 Obesity, unspecified: Secondary | ICD-10-CM | POA: Diagnosis present

## 2019-05-08 DIAGNOSIS — G894 Chronic pain syndrome: Secondary | ICD-10-CM | POA: Diagnosis not present

## 2019-05-08 DIAGNOSIS — Z8249 Family history of ischemic heart disease and other diseases of the circulatory system: Secondary | ICD-10-CM

## 2019-05-08 DIAGNOSIS — Z79899 Other long term (current) drug therapy: Secondary | ICD-10-CM

## 2019-05-08 DIAGNOSIS — K5730 Diverticulosis of large intestine without perforation or abscess without bleeding: Secondary | ICD-10-CM | POA: Diagnosis not present

## 2019-05-08 DIAGNOSIS — N400 Enlarged prostate without lower urinary tract symptoms: Secondary | ICD-10-CM | POA: Diagnosis not present

## 2019-05-08 DIAGNOSIS — I10 Essential (primary) hypertension: Secondary | ICD-10-CM | POA: Diagnosis not present

## 2019-05-08 DIAGNOSIS — J441 Chronic obstructive pulmonary disease with (acute) exacerbation: Secondary | ICD-10-CM | POA: Diagnosis not present

## 2019-05-08 DIAGNOSIS — Z4682 Encounter for fitting and adjustment of non-vascular catheter: Secondary | ICD-10-CM | POA: Diagnosis not present

## 2019-05-08 DIAGNOSIS — I5189 Other ill-defined heart diseases: Secondary | ICD-10-CM | POA: Diagnosis present

## 2019-05-08 DIAGNOSIS — Z0189 Encounter for other specified special examinations: Secondary | ICD-10-CM

## 2019-05-08 HISTORY — DX: Unspecified intestinal obstruction, unspecified as to partial versus complete obstruction: K56.609

## 2019-05-08 LAB — CBC
HCT: 51.3 % (ref 39.0–52.0)
Hemoglobin: 15.5 g/dL (ref 13.0–17.0)
MCH: 25.1 pg — ABNORMAL LOW (ref 26.0–34.0)
MCHC: 30.2 g/dL (ref 30.0–36.0)
MCV: 83.1 fL (ref 80.0–100.0)
Platelets: 214 10*3/uL (ref 150–400)
RBC: 6.17 MIL/uL — ABNORMAL HIGH (ref 4.22–5.81)
RDW: 18 % — ABNORMAL HIGH (ref 11.5–15.5)
WBC: 9.4 10*3/uL (ref 4.0–10.5)
nRBC: 0 % (ref 0.0–0.2)

## 2019-05-08 LAB — COMPREHENSIVE METABOLIC PANEL
ALT: 19 U/L (ref 0–44)
AST: 16 U/L (ref 15–41)
Albumin: 3.9 g/dL (ref 3.5–5.0)
Alkaline Phosphatase: 70 U/L (ref 38–126)
Anion gap: 8 (ref 5–15)
BUN: 14 mg/dL (ref 8–23)
CO2: 27 mmol/L (ref 22–32)
Calcium: 8.9 mg/dL (ref 8.9–10.3)
Chloride: 102 mmol/L (ref 98–111)
Creatinine, Ser: 1.25 mg/dL — ABNORMAL HIGH (ref 0.61–1.24)
GFR calc Af Amer: >60 mL/min (ref >60)
GFR calc non Af Amer: 60 mL/min — ABNORMAL LOW (ref >60)
Glucose, Bld: 159 mg/dL — ABNORMAL HIGH (ref 70–99)
Potassium: 3.6 mmol/L (ref 3.5–5.1)
Sodium: 137 mmol/L (ref 135–145)
Total Bilirubin: 1.2 mg/dL (ref 0.3–1.2)
Total Protein: 7.1 g/dL (ref 6.5–8.1)

## 2019-05-08 LAB — URINALYSIS, ROUTINE W REFLEX MICROSCOPIC
Bacteria, UA: NONE SEEN
Bilirubin Urine: NEGATIVE
Glucose, UA: NEGATIVE mg/dL
Hgb urine dipstick: NEGATIVE
Ketones, ur: 5 mg/dL — AB
Leukocytes,Ua: NEGATIVE
Nitrite: NEGATIVE
Protein, ur: 100 mg/dL — AB
Specific Gravity, Urine: 1.029 (ref 1.005–1.030)
pH: 5 (ref 5.0–8.0)

## 2019-05-08 LAB — LIPASE, BLOOD: Lipase: 22 U/L (ref 11–51)

## 2019-05-08 MED ORDER — HYDROMORPHONE HCL 1 MG/ML IJ SOLN
1.0000 mg | Freq: Once | INTRAMUSCULAR | Status: AC
  Administered 2019-05-08: 1 mg via INTRAVENOUS
  Filled 2019-05-08: qty 1

## 2019-05-08 MED ORDER — IOHEXOL 300 MG/ML  SOLN
100.0000 mL | Freq: Once | INTRAMUSCULAR | Status: AC | PRN
  Administered 2019-05-08: 100 mL via INTRAVENOUS

## 2019-05-08 MED ORDER — ONDANSETRON HCL 4 MG/2ML IJ SOLN
4.0000 mg | Freq: Once | INTRAMUSCULAR | Status: AC
  Administered 2019-05-08: 22:00:00 4 mg via INTRAVENOUS
  Filled 2019-05-08: qty 2

## 2019-05-08 MED ORDER — POTASSIUM CHLORIDE IN NACL 20-0.45 MEQ/L-% IV SOLN: INTRAVENOUS | Status: DC

## 2019-05-08 MED ORDER — HYDRALAZINE HCL 20 MG/ML IJ SOLN
10.0000 mg | Freq: Once | INTRAMUSCULAR | Status: AC
  Administered 2019-05-09: 10 mg via INTRAVENOUS
  Filled 2019-05-08 (×2): qty 1

## 2019-05-08 MED ORDER — METOPROLOL TARTRATE 5 MG/5ML IV SOLN
10.0000 mg | Freq: Four times a day (QID) | INTRAVENOUS | Status: DC
  Administered 2019-05-09 – 2019-05-10 (×8): 10 mg via INTRAVENOUS
  Administered 2019-05-10: 5 mg via INTRAVENOUS
  Administered 2019-05-11: 10 mg via INTRAVENOUS
  Filled 2019-05-08 (×14): qty 10

## 2019-05-08 MED ORDER — HYDROMORPHONE HCL 1 MG/ML IJ SOLN
0.5000 mg | Freq: Once | INTRAMUSCULAR | Status: AC
  Administered 2019-05-08: 0.5 mg via INTRAVENOUS
  Filled 2019-05-08: qty 1

## 2019-05-08 NOTE — H&P (Signed)
History and Physical    Vincent Ramos DOB: 10-31-1950 DOA: 05/08/2019  PCP: [name], MD   Patient coming from: Home.  I have personally briefly reviewed patient's old medical records in [place]  Chief Complaint: Abdominal pain, nausea and vomiting.  HPI: Vincent Ramos is a 68 y.o. male with medical history significant of osteoarthritis, asthma, CAD, community-acquired pneumonia history, enlarged prostate, grade 2 diastolic dysfunction, hypertension, history of pneumonia, type 2 diabetes mellitus who is coming to the emergency department due to abdominal pain, constipation x2 days and nausea/vomiting today.  He denies diarrhea, melena or hematochezia.  No dysuria, frequency or hematuria.  No flank pain.  Denies fever, chills, sore throat, rhinorrhea, wheezing, hemoptysis, dyspnea, chest pain, palpitations, dizziness, diaphoresis, PND, orthopnea, but states he gets occasional pitting edema both lower extremities.  He denies polyuria, polydipsia, polyphagia or blurred vision.  ED Course: Initial vital signs were temperature 98 F, pulse 78, respiration 18, blood pressure 219/109 mmHg and O2 sat 95% on room air.  The patient received analgesics and antiemetics in the emergency department along with 10 mg of hydralazine IVP for elevated blood pressure.  His urinalysis showed ketonuria 5 and proteinuria of 100 mg/dL.  White count was 9.4, hemoglobin 15.5 g/dL and platelets 214.  CMP had a glucose of 159 and creatinine of 1.25 mg/dL.  All other values were within expected range.  Lipase was normal.  Imaging: CT abdomen/pelvis with contrast showed dilated fluid-filled stomach, proximal and mid small bowel with transition to decompressed distal small bowel in the mid abdominal region to the right midline consistent with mechanical small bowel obstruction, possibly due to ideation.  Please see images and full radiology report for further details.  Review of Systems: As per  HPI otherwise 10 point review of systems negative.   Past Medical History:  Diagnosis Date   Arthritis    Asthma    CAD (coronary artery disease)    a. 11/2017: cath showing widely patent coronary arteries with normal left main, 30% mid LAD, luminal irregularities in the proximal and mid circumflex, and 30 to 40% stenosis in the mid RCA.   CAP (community acquired pneumonia) 07/31/2014   Enlarged prostate    Grade II diastolic dysfunction    Hypertension    Pneumonia    Pneumonia    Shortness of breath    Type 2 diabetes mellitus ([place])     Past Surgical History:  Procedure Laterality Date   CIRCUMCISION     COLONOSCOPY WITH PROPOFOL N/A 08/08/2017   Procedure: COLONOSCOPY WITH PROPOFOL;  Surgeon: Danie Binder, MD;  [facility];  Service: Endoscopy;  Laterality: N/A;  1:00pm   ESOPHAGEAL DILATION N/A 06/07/2017   Procedure: ESOPHAGEAL DILATION;  Surgeon: Daneil Dolin, MD;  [facility];  Service: Endoscopy;  Laterality: N/A;   ESOPHAGOGASTRODUODENOSCOPY (EGD) WITH PROPOFOL N/A 06/07/2017   Procedure: ESOPHAGOGASTRODUODENOSCOPY (EGD) WITH PROPOFOL;  Surgeon: Daneil Dolin, MD;  [facility];  Service: Endoscopy;  Laterality: N/A;   HYDROCELE EXCISION  10/14/2011   Procedure: HYDROCELECTOMY ADULT;  Surgeon: Malka So, MD;  Location: [facility];  Service: Urology;  Laterality: Right;   LACERATION REPAIR     left hand   LUMBAR LAMINECTOMY/DECOMPRESSION MICRODISCECTOMY Right 10/07/2015   Procedure: Right Lumbar four-five ,lumbar five sacral-one Laminectomy;  Surgeon: Leeroy Cha, MD;  Location: [place] [facility];  Service: Neurosurgery;  Laterality: Right;   RIGHT/LEFT HEART CATH AND CORONARY ANGIOGRAPHY  N/A 12/27/2017   Procedure: RIGHT/LEFT HEART CATH AND CORONARY ANGIOGRAPHY;  Surgeon: Belva Crome, MD;  Location: [place] [facility];  Service: Cardiovascular;  Laterality: N/A;     reports that he has never smoked. He has never used  smokeless tobacco. He reports that he does not drink alcohol or use drugs.  Allergies  Allergen Reactions   Amlodipine     Felt "jittery" constantly   Novolog [Insulin Aspart] Hives, Itching and Rash   Victoza [Liraglutide] Itching and Rash    Family History  Problem Relation Age of Onset   Diabetes Other    Hypertension Other    CAD Other    Cancer Other    Diabetes Sister    Hypertension Sister    Anesthesia problems Neg Hx    Hypotension Neg Hx    Malignant hyperthermia Neg Hx    Pseudochol deficiency Neg Hx    Colon cancer Neg Hx    Colon polyps Neg Hx    Prior to Admission medications   Medication Sig Start Date End Date Taking? Authorizing Provider  aspirin EC 81 MG tablet Take 81 mg by mouth daily.    [provider]  hydrALAZINE (APRESOLINE) 100 MG tablet Take 1 tablet (100 mg total) by mouth 2 (two) times daily. 05/23/18   Strader, Fransisco Hertz, PA-C  Insulin Degludec (TRESIBA FLEXTOUCH) 200 UNIT/ML SOPN Inject 40-50 Units into the skin daily.     [provider]  Nebivolol HCl (BYSTOLIC) 20 MG TABS Take 2 tablets (40 mg total) by mouth daily. 11/12/18   Josue Hector, MD  olmesartan-hydrochlorothiazide (BENICAR HCT) 40-25 MG tablet Take 1 tablet by mouth daily. 05/23/18   Strader, Fransisco Hertz, PA-C  potassium chloride (K-DUR) 10 MEQ tablet Take with furosemide 05/23/18   Strader, [place] M, PA-C  TRULICITY 1.5 0000000 SOPN Inject 1.5 mg into the skin every Saturday.  04/12/17   [provider]    Physical Exam: Vitals:   05/08/19 1722 05/08/19 1723  BP: (!) 219/109   Pulse: 78   Resp: 18   Temp: 98 F (36.7 C)   TempSrc: Oral   SpO2: 95%   Weight:  (!) 154.2 kg  Height:  5\' 6"  (1.676 m)    Constitutional: NAD, calm, comfortable Eyes: PERRL, lids and conjunctivae normal ENMT: Mucous membranes are very dry.  Posterior pharynx clear of any exudate or lesions. Neck: normal, supple, no masses, no  thyromegaly Respiratory: Decreased breath sounds on bases, otherwise clear to auscultation bilaterally, no wheezing, no crackles. Normal respiratory effort. No accessory muscle use.  Cardiovascular: Regular rate and rhythm, no murmurs / rubs / gallops. No extremity edema. 2+ pedal pulses. No carotid bruits.  Abdomen: Obese, distended.  Bowel sounds positive.  Soft, positive mild to moderate diffuse tenderness, no masses palpated. No hepatosplenomegaly. Bowel sounds positive.  Musculoskeletal: no clubbing / cyanosis.  Good ROM, no contractures. Normal muscle tone.  Skin: no lesion rashes, lesions, ulcers on limited dermatological exam Neurologic: CN 2-12 grossly intact. Sensation intact, DTR normal. Strength 5/5 in all 4.  Psychiatric: Normal judgment and insight. Alert and oriented x 3. Normal mood.   Labs on Admission: I have personally reviewed following labs and imaging studies  CBC: Recent Labs  Lab 05/08/19 1909  WBC 9.4  HGB 15.5  HCT 51.3  MCV 83.1  PLT [id number]   Basic Metabolic Panel: Recent Labs  Lab 05/08/19 1909  NA 137  K 3.6  CL 102  CO2 27  GLUCOSE 159*  BUN 14  CREATININE 1.25*  CALCIUM 8.9   GFR: Estimated Creatinine Clearance: 82.2 mL/min (A) (by C-G formula based on SCr of 1.25 mg/dL (H)). Liver Function Tests: Recent Labs  Lab 05/08/19 1909  AST 16  ALT 19  ALKPHOS 70  BILITOT 1.2  PROT 7.1  ALBUMIN 3.9   Recent Labs  Lab 05/08/19 1909  LIPASE 22   No results for input(s): AMMONIA in the last 168 hours. Coagulation Profile: No results for input(s): INR, PROTIME in the last 168 hours. Cardiac Enzymes: No results for input(s): CKTOTAL, CKMB, CKMBINDEX, TROPONINI in the last 168 hours. BNP (last 3 results) No results for input(s): PROBNP in the last 8760 hours. HbA1C: No results for input(s): HGBA1C in the last 72 hours. CBG: No results for input(s): GLUCAP in the last 168 hours. Lipid Profile: No results for input(s): CHOL, HDL, LDLCALC,  TRIG, CHOLHDL, LDLDIRECT in the last 72 hours. Thyroid Function Tests: No results for input(s): TSH, T4TOTAL, FREET4, T3FREE, THYROIDAB in the last 72 hours. Anemia Panel: No results for input(s): VITAMINB12, FOLATE, FERRITIN, TIBC, IRON, RETICCTPCT in the last 72 hours. Urine analysis:    Component Value Date/Time   COLORURINE AMBER (A) 05/08/2019 1730   APPEARANCEUR HAZY (A) 05/08/2019 1730   LABSPEC 1.029 05/08/2019 1730   PHURINE 5.0 05/08/2019 1730   GLUCOSEU NEGATIVE 05/08/2019 1730   HGBUR NEGATIVE 05/08/2019 1730   BILIRUBINUR NEGATIVE 05/08/2019 1730   KETONESUR 5 (A) 05/08/2019 1730   PROTEINUR 100 (A) 05/08/2019 1730   NITRITE NEGATIVE 05/08/2019 1730   LEUKOCYTESUR NEGATIVE 05/08/2019 1730    Radiological Exams on Admission: Ct Abdomen Pelvis W Contrast  Result Date: 05/08/2019 CLINICAL DATA:  Swelling ascites constipation EXAM: CT ABDOMEN AND PELVIS WITH CONTRAST TECHNIQUE: Multidetector CT imaging of the abdomen and pelvis was performed using the standard protocol following bolus administration of intravenous contrast. CONTRAST:  147mL OMNIPAQUE IOHEXOL 300 MG/ML  SOLN COMPARISON:  None. FINDINGS: Lower chest: Lung bases demonstrate no consolidation or pleural effusion. Posterior fatty pleural thickening. Borderline cardiomegaly. Hepatobiliary: No focal liver abnormality is seen. No gallstones, gallbladder wall thickening, or biliary dilatation. Pancreas: Unremarkable. No pancreatic ductal dilatation or surrounding inflammatory changes. Spleen: Normal in size without focal abnormality. Adrenals/Urinary Tract: Adrenal glands are unremarkable. Kidneys are normal, without renal calculi, focal lesion, or hydronephrosis. Bladder is unremarkable. Stomach/Bowel: Moderate fluid distension of the stomach. Dilated fluid-filled proximal to mid small bowel measuring up to 3.8 cm in diameter. Transition to decompressed small bowel in the mid abdomen slightly to the right of midline. Small  bowel distal to this appears decompressed. Negative colon wall thickening. Negative appendix. Descending and sigmoid colon diverticula without acute inflammatory process. Vascular/Lymphatic: Mild aortic atherosclerosis. No aneurysmal dilatation. No significantly enlarged lymph nodes Reproductive: Prostate is unremarkable. Other: Negative for free air. Small amount of free fluid within the bilateral colic gutters. Musculoskeletal: Degenerative changes of the spine. No acute or suspicious osseous abnormality. IMPRESSION: 1. Dilated fluid-filled stomach, proximal and mid small bowel with transition to decompressed distal small bowel in the mid abdominal region, to the right of midline, consistent with mechanical small bowel obstruction, possibly due to adhesion. Small amount of free fluid in the abdomen but negative for free air. 2. Descending and sigmoid colon diverticular disease without acute inflammatory process. Electronically Signed   By: Donavan Foil M.D.   On: 05/08/2019 22:44   09/18/2017 echocardiogram complete ------------------------------------------------------------------- LV EF: 60% -   65%  ------------------------------------------------------------------- Indications:  CHF - 428.0.  ------------------------------------------------------------------- History:   PMH:  Elevated troponin. Acquired from the patient and from the patient&'s chart.  Chronic obstructive pulmonary disease. PMH:  Obesity.  Risk factors:  Hypertension. Diabetes mellitus.  ------------------------------------------------------------------- Study Conclusions  - Left ventricle: The cavity size was mildly dilated. Wall   thickness was increased in a pattern of severe LVH. Systolic   function was normal. The estimated ejection fraction was in the   range of 60% to 65%.  Impressions:  - LVEF is normal Cannot evaluate regional wall motion fully as   endocardium is not well seen. Consider limited echo  with Definity   to define wall motion  EKG: Independently reviewed.  Assessment/Plan Principal Problem:   SBO (small bowel obstruction) (HCC) Admit to telemetry/inpatient. Keep n.p.o. Continue time-limited IV fluids. Antiemetics as needed. Analgesics as needed. General surgery to evaluate in a.m.  Active Problems:   Type 2 diabetes mellitus (HCC) Currently n.p.o. with glucose 159 mg/dL. He states that he has not eaten much over the past 2 days. Hold long-acting insulin while the patient is n.p.o.    Hypertension Currently n.p.o. Metoprolol 10 mg IVP every 6 hours. Monitor BP and HR.    Obesity Needs significant lifestyle modifications.    Grade II diastolic dysfunction No signs of decompensation at this time.    Constipation Minimize opioids. Escalate therapy as needed.   DVT prophylaxis: SCDs. Code Status: Full code. Family Communication: Disposition Plan: Admit for IV hydration, bowel rest and symptoms treatment. Consults called: General surgery routine consult   Admission status: Inpatient/telemetry.   Vincent Milan MD [facility]  If 7PM-7AM, please contact night-coverage  05/08/2019, 11:43 PM   This document was prepared using Dragon voice recognition software and may contain some unintended transcription errors.

## 2019-05-08 NOTE — ED Triage Notes (Signed)
Pt has not had a good bowel movement in 2 days, has taken several laxatives with no results.  Had some episodes of nausea and vomiting today.  C/o all over abd pain and bloated feeling.

## 2019-05-08 NOTE — ED Provider Notes (Signed)
[facility] EMERGENCY DEPARTMENT Provider Note   CSN: [id number] Arrival date & time: 05/08/19  1708     History   Chief Complaint Chief Complaint  Patient presents with  . Abdominal Pain    HPI Vincent Ramos is a 68 y.o. male.     The history is provided by the patient. No language interpreter was used.  Abdominal Pain Pain location:  Generalized Pain quality: aching, bloating, fullness and heavy   Pain radiates to:  Does not radiate Pain severity:  Severe Onset quality:  Gradual Timing:  Constant Progression:  Worsening Chronicity:  New Relieved by:  Nothing Worsened by:  Nothing Ineffective treatments: episom salt. Associated symptoms: constipation and vomiting   Risk factors: no alcohol abuse     Past Medical History:  Diagnosis Date  . Arthritis   . Asthma   . CAD (coronary artery disease)    a. 11/2017: cath showing widely patent coronary arteries with normal left main, 30% mid LAD, luminal irregularities in the proximal and mid circumflex, and 30 to 40% stenosis in the mid RCA.  [place] Kitchen CAP (community acquired pneumonia) 07/31/2014  . Enlarged prostate   . Grade II diastolic dysfunction   . Hypertension   . Pneumonia   . Pneumonia   . Shortness of breath   . Type 2 diabetes mellitus [facility])     Patient Active Problem List   Diagnosis Date Noted  . Constipation 02/06/2018  . Abnormal nuclear cardiac imaging test 12/27/2017  . Respiratory failure ([place]) 09/18/2017  . COPD exacerbation ([place]) 09/17/2017  . Elevated troponin 09/17/2017  . Grade II diastolic dysfunction [id number]  . Acute on chronic diastolic CHF (congestive heart failure) ([place]) 09/17/2017  . Polyp of rectum   . Encounter for screening colonoscopy 07/09/2017  . Aspiration pneumonia ([place]) 06/06/2017  . Dysphagia   . Acute respiratory failure with hypoxia ([place]) 06/01/2017  . PNA (pneumonia) 05/30/2017  . Lumbar stenosis 10/07/2015  . Type 2 diabetes mellitus ([place]) 07/31/2014  .  Hypertension 07/31/2014  . Obesity 07/31/2014  . Community acquired pneumonia 07/31/2014    Past Surgical History:  Procedure Laterality Date  . CIRCUMCISION    . COLONOSCOPY WITH PROPOFOL N/A 08/08/2017   Procedure: COLONOSCOPY WITH PROPOFOL;  Surgeon: Danie Binder, MD;  [facility];  Service: Endoscopy;  Laterality: N/A;  1:00pm  . ESOPHAGEAL DILATION N/A 06/07/2017   Procedure: ESOPHAGEAL DILATION;  Surgeon: Daneil Dolin, MD;  [facility];  Service: Endoscopy;  Laterality: N/A;  . ESOPHAGOGASTRODUODENOSCOPY (EGD) WITH PROPOFOL N/A 06/07/2017   Procedure: ESOPHAGOGASTRODUODENOSCOPY (EGD) WITH PROPOFOL;  Surgeon: Daneil Dolin, MD;  [facility];  Service: Endoscopy;  Laterality: N/A;  . HYDROCELE EXCISION  10/14/2011   Procedure: HYDROCELECTOMY ADULT;  Surgeon: Malka So, MD;  Location: [facility];  Service: Urology;  Laterality: Right;  . LACERATION REPAIR     left hand  . LUMBAR LAMINECTOMY/DECOMPRESSION MICRODISCECTOMY Right 10/07/2015   Procedure: Right Lumbar four-five ,lumbar five sacral-one Laminectomy;  Surgeon: Leeroy Cha, MD;  Location: [place] NEURO ORS;  Service: Neurosurgery;  Laterality: Right;  . RIGHT/LEFT HEART CATH AND CORONARY ANGIOGRAPHY N/A 12/27/2017   Procedure: RIGHT/LEFT HEART CATH AND CORONARY ANGIOGRAPHY;  Surgeon: Belva Crome, MD;  Location: [place] [facility] LAB;  Service: Cardiovascular;  Laterality: N/A;        Home Medications    Prior to Admission medications   Medication Sig Start Date End Date Taking? Authorizing Provider  aspirin  EC 81 MG tablet Take 81 mg by mouth daily.    [provider]  hydrALAZINE (APRESOLINE) 100 MG tablet Take 1 tablet (100 mg total) by mouth 2 (two) times daily. 05/23/18   Strader, Fransisco Hertz, PA-C  Insulin Degludec (TRESIBA FLEXTOUCH) 200 UNIT/ML SOPN Inject 40-50 Units into the skin daily.     [provider]  Nebivolol HCl (BYSTOLIC) 20 MG TABS Take 2 tablets (40 mg  total) by mouth daily. 11/12/18   Josue Hector, MD  olmesartan-hydrochlorothiazide (BENICAR HCT) 40-25 MG tablet Take 1 tablet by mouth daily. 05/23/18   Strader, Fransisco Hertz, PA-C  potassium chloride (K-DUR) 10 MEQ tablet Take with furosemide 05/23/18   Strader, [place] M, PA-C  TRULICITY 1.5 0000000 SOPN Inject 1.5 mg into the skin every Saturday.  04/12/17   [provider]    Family History Family History  Problem Relation Age of Onset  . Diabetes Other   . Hypertension Other   . CAD Other   . Cancer Other   . Diabetes Sister   . Hypertension Sister   . Anesthesia problems Neg Hx   . Hypotension Neg Hx   . Malignant hyperthermia Neg Hx   . Pseudochol deficiency Neg Hx   . Colon cancer Neg Hx   . Colon polyps Neg Hx     Social History Social History   Tobacco Use  . Smoking status: Never Smoker  . Smokeless tobacco: Never Used  Substance Use Topics  . Alcohol use: No    Alcohol/week: 0.0 standard drinks    Frequency: Never    Comment: rare beer  . Drug use: No     Allergies   Amlodipine, Novolog [insulin aspart], and Victoza [liraglutide]   Review of Systems Review of Systems  Gastrointestinal: Positive for abdominal pain, constipation and vomiting.  All other systems reviewed and are negative.    Physical Exam Updated Vital Signs BP (!) 219/109 (BP Location: Right Arm)   Pulse 78   Temp 98 F (36.7 C) (Oral)   Resp 18   Ht 5\' 6"  (1.676 m)   Wt (!) 154.2 kg   SpO2 95%   BMI 54.88 kg/m   Physical Exam Vitals signs and nursing note reviewed.  Constitutional:      Appearance: He is well-developed.  HENT:     Head: Normocephalic and atraumatic.     Mouth/Throat:     Mouth: Mucous membranes are moist.  Eyes:     Conjunctiva/sclera: Conjunctivae normal.  Neck:     Musculoskeletal: Neck supple.  Cardiovascular:     Rate and Rhythm: Normal rate and regular rhythm.     Heart sounds: No murmur.  Pulmonary:     Effort: Pulmonary effort  is normal. No respiratory distress.     Breath sounds: Normal breath sounds.  Abdominal:     General: Abdomen is protuberant. Bowel sounds are normal.     Palpations: Abdomen is soft.     Tenderness: There is no abdominal tenderness.     Comments: distended  Skin:    General: Skin is warm and dry.  Neurological:     General: No focal deficit present.     Mental Status: He is alert.  Psychiatric:        Mood and Affect: Mood normal.      ED Treatments / Results  Labs (all labs ordered are listed, but only abnormal results are displayed) Labs Reviewed  COMPREHENSIVE METABOLIC PANEL - Abnormal; Notable for the  following components:      Result Value   Glucose, Bld 159 (*)    Creatinine, Ser 1.25 (*)    GFR calc non Af Amer 60 (*)    All other components within normal limits  CBC - Abnormal; Notable for the following components:   RBC 6.17 (*)    MCH 25.1 (*)    RDW 18.0 (*)    All other components within normal limits  URINALYSIS, ROUTINE W REFLEX MICROSCOPIC - Abnormal; Notable for the following components:   Color, Urine AMBER (*)    APPearance HAZY (*)    Ketones, ur 5 (*)    Protein, ur 100 (*)    All other components within normal limits  LIPASE, BLOOD    EKG None  Radiology Ct Abdomen Pelvis W Contrast  Result Date: 05/08/2019 CLINICAL DATA:  Swelling ascites constipation EXAM: CT ABDOMEN AND PELVIS WITH CONTRAST TECHNIQUE: Multidetector CT imaging of the abdomen and pelvis was performed using the standard protocol following bolus administration of intravenous contrast. CONTRAST:  16mL OMNIPAQUE IOHEXOL 300 MG/ML  SOLN COMPARISON:  None. FINDINGS: Lower chest: Lung bases demonstrate no consolidation or pleural effusion. Posterior fatty pleural thickening. Borderline cardiomegaly. Hepatobiliary: No focal liver abnormality is seen. No gallstones, gallbladder wall thickening, or biliary dilatation. Pancreas: Unremarkable. No pancreatic ductal dilatation or surrounding  inflammatory changes. Spleen: Normal in size without focal abnormality. Adrenals/Urinary Tract: Adrenal glands are unremarkable. Kidneys are normal, without renal calculi, focal lesion, or hydronephrosis. Bladder is unremarkable. Stomach/Bowel: Moderate fluid distension of the stomach. Dilated fluid-filled proximal to mid small bowel measuring up to 3.8 cm in diameter. Transition to decompressed small bowel in the mid abdomen slightly to the right of midline. Small bowel distal to this appears decompressed. Negative colon wall thickening. Negative appendix. Descending and sigmoid colon diverticula without acute inflammatory process. Vascular/Lymphatic: Mild aortic atherosclerosis. No aneurysmal dilatation. No significantly enlarged lymph nodes Reproductive: Prostate is unremarkable. Other: Negative for free air. Small amount of free fluid within the bilateral colic gutters. Musculoskeletal: Degenerative changes of the spine. No acute or suspicious osseous abnormality. IMPRESSION: 1. Dilated fluid-filled stomach, proximal and mid small bowel with transition to decompressed distal small bowel in the mid abdominal region, to the right of midline, consistent with mechanical small bowel obstruction, possibly due to adhesion. Small amount of free fluid in the abdomen but negative for free air. 2. Descending and sigmoid colon diverticular disease without acute inflammatory process. Electronically Signed   By: Donavan Foil M.D.   On: 05/08/2019 22:44    Procedures Procedures (including critical care time)  Medications Ordered in ED Medications  HYDROmorphone (DILAUDID) injection 0.5 mg (0.5 mg Intravenous Given 05/08/19 2134)  ondansetron (ZOFRAN) injection 4 mg (4 mg Intravenous Given 05/08/19 2134)  iohexol (OMNIPAQUE) 300 MG/ML solution 100 mL (100 mLs Intravenous Contrast Given 05/08/19 2211)     Initial Impression / Assessment and Plan / ED Course  I have reviewed the triage vital signs and the nursing  notes.  Pertinent labs & imaging results that were available during my care of the patient were reviewed by me and considered in my medical decision making (see chart for details).        MDM   Ct scan shows small bowel obstruction.  I spoke with Dr. Arnoldo Morale who advised Ng tube, hospitalist admit and he will see in the am.  Dr. Olevia Bowens will see for admission   Final Clinical Impressions(s) / ED Diagnoses   Final diagnoses:  Complete  intestinal obstruction, unspecified cause [facility])    ED Discharge Orders    None       Sidney Ace 05/08/19 2345    Julianne Rice, MD 05/08/19 2347

## 2019-05-08 NOTE — ED Notes (Signed)
Pt is aware we need urine sample, urinal at bedside.  

## 2019-05-08 NOTE — ED Notes (Signed)
Urinal at the bedside.  Pt notified that we need urine sample

## 2019-05-09 ENCOUNTER — Encounter (HOSPITAL_COMMUNITY): Payer: Self-pay | Admitting: Internal Medicine

## 2019-05-09 LAB — SARS CORONAVIRUS 2 BY RT PCR (HOSPITAL ORDER, PERFORMED IN ~~LOC~~ HOSPITAL LAB): SARS Coronavirus 2: NEGATIVE

## 2019-05-09 LAB — CBG MONITORING, ED: Glucose-Capillary: 84 mg/dL (ref 70–99)

## 2019-05-09 LAB — HIV ANTIBODY (ROUTINE TESTING W REFLEX): HIV Screen 4th Generation wRfx: NONREACTIVE

## 2019-05-09 LAB — GLUCOSE, CAPILLARY
Glucose-Capillary: 75 mg/dL (ref 70–99)
Glucose-Capillary: 81 mg/dL (ref 70–99)

## 2019-05-09 LAB — PHOSPHORUS: Phosphorus: 3.2 mg/dL (ref 2.5–4.6)

## 2019-05-09 LAB — MAGNESIUM: Magnesium: 2.4 mg/dL (ref 1.7–2.4)

## 2019-05-09 MED ORDER — POTASSIUM CHLORIDE IN NACL 20-0.45 MEQ/L-% IV SOLN
INTRAVENOUS | Status: AC
  Filled 2019-05-09: qty 1000

## 2019-05-09 MED ORDER — ENOXAPARIN SODIUM 40 MG/0.4ML ~~LOC~~ SOLN
40.0000 mg | SUBCUTANEOUS | Status: DC
  Administered 2019-05-09: 40 mg via SUBCUTANEOUS
  Filled 2019-05-09: qty 0.4

## 2019-05-09 MED ORDER — HYDROMORPHONE HCL 1 MG/ML IJ SOLN
1.0000 mg | INTRAMUSCULAR | Status: DC | PRN
  Administered 2019-05-09 – 2019-05-12 (×12): 1 mg via INTRAVENOUS
  Filled 2019-05-09 (×12): qty 1

## 2019-05-09 MED ORDER — POTASSIUM CHLORIDE IN NACL 20-0.45 MEQ/L-% IV SOLN
INTRAVENOUS | Status: DC
  Administered 2019-05-09 – 2019-05-10 (×3): via INTRAVENOUS
  Filled 2019-05-09 (×3): qty 1000

## 2019-05-09 MED ORDER — PROCHLORPERAZINE EDISYLATE 10 MG/2ML IJ SOLN
10.0000 mg | INTRAMUSCULAR | Status: DC | PRN
  Administered 2019-05-09: 10 mg via INTRAVENOUS
  Filled 2019-05-09: qty 2

## 2019-05-09 MED ORDER — PHENOL 1.4 % MT LIQD
1.0000 | OROMUCOSAL | Status: DC | PRN
  Filled 2019-05-09: qty 177

## 2019-05-09 NOTE — ED Notes (Signed)
Nasal cannula placed on patient. Patient on 4 liters of oxygen due to oxygen saturation being at 88 percent.

## 2019-05-09 NOTE — Progress Notes (Signed)
PROGRESS NOTE  Vincent Ramos  DOB: [birth year]  PCP: [name], MD HT:[id number]  DOA: 05/08/2019  LOS: 1 day   Brief narrative: Patient is a 68 y.o. male with PMH of hypertension, diabetes mellitus type 2, CAD, diastolic CHF, osteoarthritis, BPH. He presented to the ED on 10/7 due to abdominal pain, constipation  and nausea vomiting for 2 days.    In the ED, patient no fever, blood pressure elevated to 219/109. Work-up showed WBC count normal at 9.4, creatinine 1.25, lipase normal. CT abdomen/pelvis with contrast showed dilated fluid-filled stomach, proximal and mid small bowel with transition to decompressed distal small bowel in the mid abdominal region to the right midline consistent with mechanical small bowel obstruction, possibly due to adhesion.    Patient admitted under hospitalist service for small bowel obstruction.  Subjective: Patient was seen and examined this afternoon.  Uncomfortable because of having an NG tube in place.  Abdominal pain improving.  Assessment/Plan: Small bowel obstruction of unknown etiology -Currently conservative management with IV fluid, IV pain medicine, NG tube decompression. -General surgery consult appreciated. -Bowel sound present.  Type 2 diabetes mellitus -Last A1c from 2017 and was 9.3. -Obtain A1c. -Home meds include Tresiba 60 units daily and Trulicity 1.5 mg weekly. -Patient is allergic to insulin aspart with rashes and hives.  Sliding scale not available with regular insulin. -I ordered for fingersticks for ACHS and 3 am.  If he is blood sugar level exceeds 200, consider starting him on insulin drip with regular insulin.  Cardiovascular issues: HTN / HLD / diastolic CHF / CAD -Home meds include aspirin, statin, hydralazine, nebivolol, olmesartan/HCTZ, potassium supplementation -Currently not on CHF exacerbation.  All oral medicines are on hold.  On PRN hydralazine and PRN metoprolol.  Continue the same.  Morbid obesity -  Body mass index is 54.88 kg/m. Patient has been advised to make an attempt to improve diet and exercise patterns to aid in weight loss.  Constipation -Minimize opioids. -Escalate therapy as needed.  Mobility: Encourage ambulation DVT prophylaxis:  Lovenox subcu Code Status:   Code Status: Full Code  Family Communication:  Expected Discharge:  Continuing patient care  Consultants:  General surgery  Procedures:  None  Antimicrobials: Anti-infectives (From admission, onward)   None      Diet Order            Diet NPO time specified  Diet effective now              Infusions:    Scheduled Meds: . metoprolol tartrate  10 mg Intravenous Q6H    PRN meds: HYDROmorphone (DILAUDID) injection, prochlorperazine   Objective: Vitals:   05/09/19 0600 05/09/19 1102  BP: (!) 144/77 (!) 156/84  Pulse: 70 72  Resp: 13 16  Temp:    SpO2: 92% (!) 84%    Intake/Output Summary (Last 24 hours) at 05/09/2019 1520 Last data filed at 05/09/2019 0110 Gross per 24 hour  Intake -  Output 1800 ml  Net -1800 ml   Filed Weights   05/08/19 1723  Weight: (!) 154.2 kg   Weight change:  Body mass index is 54.88 kg/m.   Physical Exam: General exam: Appears calm and comfortable.  Morbidly obese. NG tube to suction. Skin: No rashes, lesions or ulcers. HEENT: Atraumatic, normocephalic, supple neck, no obvious bleeding Lungs: Clear to auscultation bilaterally CVS: Regular rate and rhythm, no murmur GI/Abd soft, nontender, distended from obesity, bowel sound present CNS: Alert, awake, oriented x3 Psychiatry: Mood appropriate  Extremities: Pedal edema 1+ bilaterally  Data Review: I have personally reviewed the laboratory data and studies available.  Recent Labs  Lab 05/08/19 1909  WBC 9.4  HGB 15.5  HCT 51.3  MCV 83.1  PLT 214   Recent Labs  Lab 05/08/19 1909  NA 137  K 3.6  CL 102  CO2 27  GLUCOSE 159*  BUN 14  CREATININE 1.25*  CALCIUM 8.9  MG 2.4  PHOS 3.2     Terrilee Croak, MD  [facility] 05/09/2019

## 2019-05-09 NOTE — Consult Note (Signed)
Reason for Consult: Small bowel obstruction Referring Physician: Dr. Devona Konig Vincent Ramos is an 68 y.o. male.  HPI: Patient is a 68 year old black male with multiple medical problems who presented to the emergency room with a 2-day history of nausea and vomiting.  He also states he has been constipated.  This is his first episode.  He was seen in the emergency room and a CT scan of the abdomen revealed a small bowel obstruction in the midportion of his small bowel.  He has never had abdominal surgery.  An NG tube was placed and he had approximately 1.7 L removed.  When I saw him this morning, he states that he did have a bowel movement and he feels much better.  He currently denies any abdominal pain.  He feels his abdomen is almost back to normal.  Past Medical History:  Diagnosis Date  . Arthritis   . Asthma   . CAD (coronary artery disease)    a. 11/2017: cath showing widely patent coronary arteries with normal left main, 30% mid LAD, luminal irregularities in the proximal and mid circumflex, and 30 to 40% stenosis in the mid RCA.  Marland Kitchen CAP (community acquired pneumonia) 07/31/2014  . Enlarged prostate   . Grade II diastolic dysfunction   . Hypertension   . Pneumonia   . Pneumonia   . Shortness of breath   . Type 2 diabetes mellitus ([place])     Past Surgical History:  Procedure Laterality Date  . CIRCUMCISION    . COLONOSCOPY WITH PROPOFOL N/A 08/08/2017   Procedure: COLONOSCOPY WITH PROPOFOL;  Surgeon: Danie Binder, MD;  [facility];  Service: Endoscopy;  Laterality: N/A;  1:00pm  . ESOPHAGEAL DILATION N/A 06/07/2017   Procedure: ESOPHAGEAL DILATION;  Surgeon: Daneil Dolin, MD;  [facility];  Service: Endoscopy;  Laterality: N/A;  . ESOPHAGOGASTRODUODENOSCOPY (EGD) WITH PROPOFOL N/A 06/07/2017   Procedure: ESOPHAGOGASTRODUODENOSCOPY (EGD) WITH PROPOFOL;  Surgeon: Daneil Dolin, MD;  [facility];  Service: Endoscopy;  Laterality: N/A;  .  HYDROCELE EXCISION  10/14/2011   Procedure: HYDROCELECTOMY ADULT;  Surgeon: Malka So, MD;  Location: [facility];  Service: Urology;  Laterality: Right;  . LACERATION REPAIR     left hand  . LUMBAR LAMINECTOMY/DECOMPRESSION MICRODISCECTOMY Right 10/07/2015   Procedure: Right Lumbar four-five ,lumbar five sacral-one Laminectomy;  Surgeon: Leeroy Cha, MD;  Location: [facility];  Service: Neurosurgery;  Laterality: Right;  . RIGHT/LEFT HEART CATH AND CORONARY ANGIOGRAPHY N/A 12/27/2017   Procedure: RIGHT/LEFT HEART CATH AND CORONARY ANGIOGRAPHY;  Surgeon: Belva Crome, MD;  Location: [facility];  Service: Cardiovascular;  Laterality: N/A;    Family History  Problem Relation Age of Onset  . Diabetes Other   . Hypertension Other   . CAD Other   . Cancer Other   . Diabetes Sister   . Hypertension Sister   . Anesthesia problems Neg Hx   . Hypotension Neg Hx   . Malignant hyperthermia Neg Hx   . Pseudochol deficiency Neg Hx   . Colon cancer Neg Hx   . Colon polyps Neg Hx     Social History:  reports that he has never smoked. He has never used smokeless tobacco. He reports that he does not drink alcohol or use drugs.  Allergies:  Allergies  Allergen Reactions  . Amlodipine     Felt "jittery" constantly  . Novolog [Insulin Aspart] Hives, Itching and Rash  . Victoza [  Liraglutide] Itching and Rash    Medications: I have reviewed the patient's current medications.  Results for orders placed or performed during the hospital encounter of 05/08/19 (from the past 48 hour(s))  Urinalysis, Routine w reflex microscopic     Status: Abnormal   Collection Time: 05/08/19  5:30 PM  Result Value Ref Range   Color, Urine AMBER (A) YELLOW    Comment: BIOCHEMICALS MAY BE AFFECTED BY COLOR   APPearance HAZY (A) CLEAR   Specific Gravity, Urine 1.029 1.005 - 1.030   pH 5.0 5.0 - 8.0   Glucose, UA NEGATIVE NEGATIVE mg/dL   Hgb urine dipstick NEGATIVE NEGATIVE   Bilirubin Urine NEGATIVE  NEGATIVE   Ketones, ur 5 (A) NEGATIVE mg/dL   Protein, ur 100 (A) NEGATIVE mg/dL   Nitrite NEGATIVE NEGATIVE   Leukocytes,Ua NEGATIVE NEGATIVE   RBC / HPF 0-5 0 - 5 RBC/hpf   WBC, UA 0-5 0 - 5 WBC/hpf   Bacteria, UA NONE SEEN NONE SEEN   Mucus PRESENT     Comment: Performed at [facility], [address] [place], [place] [address]  Lipase, blood     Status: None   Collection Time: 05/08/19  7:09 PM  Result Value Ref Range   Lipase 22 11 - 51 U/L    Comment: Performed at [facility], [address], [place], [place] [address]  Comprehensive metabolic panel     Status: Abnormal   Collection Time: 05/08/19  7:09 PM  Result Value Ref Range   Sodium 137 135 - 145 mmol/L   Potassium 3.6 3.5 - 5.1 mmol/L   Chloride 102 98 - 111 mmol/L   CO2 27 22 - 32 mmol/L   Glucose, Bld 159 (H) 70 - 99 mg/dL   BUN 14 8 - 23 mg/dL   Creatinine, Ser 1.25 (H) 0.61 - 1.24 mg/dL   Calcium 8.9 8.9 - 10.3 mg/dL   Total Protein 7.1 6.5 - 8.1 g/dL   Albumin 3.9 3.5 - 5.0 g/dL   AST 16 15 - 41 U/L   ALT 19 0 - 44 U/L   Alkaline Phosphatase 70 38 - 126 U/L   Total Bilirubin 1.2 0.3 - 1.2 mg/dL   GFR calc non Af Amer 60 (L) >60 mL/min   GFR calc Af Amer >60 >60 mL/min   Anion gap 8 5 - 15    Comment: Performed at [facility], [address] [place], [place] [address]  CBC     Status: Abnormal   Collection Time: 05/08/19  7:09 PM  Result Value Ref Range   WBC 9.4 4.0 - 10.5 K/uL   RBC 6.17 (H) 4.22 - 5.81 MIL/uL   Hemoglobin 15.5 13.0 - 17.0 g/dL   HCT 51.3 39.0 - 52.0 %   MCV 83.1 80.0 - 100.0 fL   MCH 25.1 (L) 26.0 - 34.0 pg   MCHC 30.2 30.0 - 36.0 g/dL   RDW 18.0 (H) 11.5 - 15.5 %   Platelets 214 150 - 400 K/uL   nRBC 0.0 0.0 - 0.2 %    Comment: Performed at [facility], [address] [place], [place] [address]  Magnesium     Status: None   Collection Time: 05/08/19  7:09 PM  Result Value Ref Range   Magnesium 2.4 1.7 - 2.4 mg/dL    Comment: Performed at [facility], [address]  [place], [place] [address]  Phosphorus     Status: None   Collection Time: 05/08/19  7:09  PM  Result Value Ref Range   Phosphorus 3.2 2.5 - 4.6 mg/dL    Comment: Performed at [facility], [address], [place], [place] [address]  SARS Coronavirus 2 [facility] order, Performed in [facility] hospital lab) Nasopharyngeal Nasopharyngeal Swab     Status: None   Collection Time: 05/08/19 11:07 PM   Specimen: Nasopharyngeal Swab  Result Value Ref Range   SARS Coronavirus 2 NEGATIVE NEGATIVE    Comment: (NOTE) If result is NEGATIVE SARS-CoV-2 target nucleic acids are NOT DETECTED. The SARS-CoV-2 RNA is generally detectable in upper and lower  respiratory specimens during the acute phase of infection. The lowest  concentration of SARS-CoV-2 viral copies this assay can detect is 250  copies / mL. A negative result does not preclude SARS-CoV-2 infection  and should not be used as the sole basis for treatment or other  patient management decisions.  A negative result may occur with  improper specimen collection / handling, submission of specimen other  than nasopharyngeal swab, presence of viral mutation(s) within the  areas targeted by this assay, and inadequate number of viral copies  (<250 copies / mL). A negative result must be combined with clinical  observations, patient history, and epidemiological information. If result is POSITIVE SARS-CoV-2 target nucleic acids are DETECTED. The SARS-CoV-2 RNA is generally detectable in upper and lower  respiratory specimens dur ing the acute phase of infection.  Positive  results are indicative of active infection with SARS-CoV-2.  Clinical  correlation with patient history and other diagnostic information is  necessary to determine patient infection status.  Positive results do  not rule out bacterial infection or co-infection with other viruses. If result is PRESUMPTIVE POSTIVE SARS-CoV-2 nucleic acids MAY BE PRESENT.   A presumptive positive result  was obtained on the submitted specimen  and confirmed on repeat testing.  While 2019 novel coronavirus  (SARS-CoV-2) nucleic acids may be present in the submitted sample  additional confirmatory testing may be necessary for epidemiological  and / or clinical management purposes  to differentiate between  SARS-CoV-2 and other Sarbecovirus currently known to infect humans.  If clinically indicated additional testing with an alternate test  methodology [phone]) is advised. The SARS-CoV-2 RNA is generally  detectable in upper and lower respiratory sp ecimens during the acute  phase of infection. The expected result is Negative. Fact Sheet for Patients:  StrictlyIdeas.no Fact Sheet for Healthcare Providers: BankingDealers.co.za This test is not yet approved or cleared by the [place] FDA and has been authorized for detection and/or diagnosis of SARS-CoV-2 by FDA under an Emergency Use Authorization (EUA).  This EUA will remain in effect (meaning this test can be used) for the duration of the COVID-19 declaration under Section 564(b)(1) of the Act, 21 U.S.C. section 360bbb-3(b)(1), unless the authorization is terminated or revoked sooner. Performed at [facility], [address] [place], [place] [address]     Ct Abdomen Pelvis W Contrast  Result Date: 05/08/2019 CLINICAL DATA:  Swelling ascites constipation EXAM: CT ABDOMEN AND PELVIS WITH CONTRAST TECHNIQUE: Multidetector CT imaging of the abdomen and pelvis was performed using the standard protocol following bolus administration of intravenous contrast. CONTRAST:  159mL OMNIPAQUE IOHEXOL 300 MG/ML  SOLN COMPARISON:  None. FINDINGS: Lower chest: Lung bases demonstrate no consolidation or pleural effusion. Posterior fatty pleural thickening. Borderline cardiomegaly. Hepatobiliary: No focal liver abnormality is seen. No gallstones, gallbladder wall thickening, or biliary dilatation. Pancreas:  Unremarkable. No pancreatic ductal dilatation or surrounding inflammatory changes. Spleen: Normal  in size without focal abnormality. Adrenals/Urinary Tract: Adrenal glands are unremarkable. Kidneys are normal, without renal calculi, focal lesion, or hydronephrosis. Bladder is unremarkable. Stomach/Bowel: Moderate fluid distension of the stomach. Dilated fluid-filled proximal to mid small bowel measuring up to 3.8 cm in diameter. Transition to decompressed small bowel in the mid abdomen slightly to the right of midline. Small bowel distal to this appears decompressed. Negative colon wall thickening. Negative appendix. Descending and sigmoid colon diverticula without acute inflammatory process. Vascular/Lymphatic: Mild aortic atherosclerosis. No aneurysmal dilatation. No significantly enlarged lymph nodes Reproductive: Prostate is unremarkable. Other: Negative for free air. Small amount of free fluid within the bilateral colic gutters. Musculoskeletal: Degenerative changes of the spine. No acute or suspicious osseous abnormality. IMPRESSION: 1. Dilated fluid-filled stomach, proximal and mid small bowel with transition to decompressed distal small bowel in the mid abdominal region, to the right of midline, consistent with mechanical small bowel obstruction, possibly due to adhesion. Small amount of free fluid in the abdomen but negative for free air. 2. Descending and sigmoid colon diverticular disease without acute inflammatory process. Electronically Signed   By: Donavan Foil M.D.   On: 05/08/2019 22:44    ROS:  Pertinent items are noted in HPI.  Blood pressure (!) 144/77, pulse 70, temperature 98 F (36.7 C), temperature source Oral, resp. rate 13, height 5\' 6"  (1.676 m), weight (!) 154.2 kg, SpO2 92 %. Physical Exam: Morbidly obese black male in no acute distress Head is normocephalic, atraumatic Lungs clear to auscultation with good breath sounds bilaterally Heart examination reveals a regular rate and  rhythm without S3, S4, murmurs Abdomen is large with some distention noted.  Bowel sounds are active.  No tenderness is noted.  No hernias are present.  CT scan images personally reviewed  Assessment/Plan: Impression: Small bowel obstruction of unknown etiology.  Patient does have a history of diverticulosis.  As he is feeling much better, no need for acute surgical intervention at this time. Plan: Agree with continued NG tube decompression.  Will follow with you.  Will monitor abdominal findings.  Aviva Signs 05/09/2019, 8:51 AM

## 2019-05-10 LAB — CBC WITH DIFFERENTIAL/PLATELET
Abs Immature Granulocytes: 0.02 10*3/uL (ref 0.00–0.07)
Basophils Absolute: 0 10*3/uL (ref 0.0–0.1)
Basophils Relative: 0 %
Eosinophils Absolute: 0.2 10*3/uL (ref 0.0–0.5)
Eosinophils Relative: 3 %
HCT: 45.8 % (ref 39.0–52.0)
Hemoglobin: 13.8 g/dL (ref 13.0–17.0)
Immature Granulocytes: 0 %
Lymphocytes Relative: 17 %
Lymphs Abs: 1.2 10*3/uL (ref 0.7–4.0)
MCH: 25.3 pg — ABNORMAL LOW (ref 26.0–34.0)
MCHC: 30.1 g/dL (ref 30.0–36.0)
MCV: 83.9 fL (ref 80.0–100.0)
Monocytes Absolute: 0.9 10*3/uL (ref 0.1–1.0)
Monocytes Relative: 13 %
Neutro Abs: 4.6 10*3/uL (ref 1.7–7.7)
Neutrophils Relative %: 67 %
Platelets: 175 10*3/uL (ref 150–400)
RBC: 5.46 MIL/uL (ref 4.22–5.81)
RDW: 16.8 % — ABNORMAL HIGH (ref 11.5–15.5)
WBC: 6.9 10*3/uL (ref 4.0–10.5)
nRBC: 0 % (ref 0.0–0.2)

## 2019-05-10 LAB — BASIC METABOLIC PANEL
Anion gap: 10 (ref 5–15)
BUN: 16 mg/dL (ref 8–23)
CO2: 28 mmol/L (ref 22–32)
Calcium: 8.1 mg/dL — ABNORMAL LOW (ref 8.9–10.3)
Chloride: 103 mmol/L (ref 98–111)
Creatinine, Ser: 1.23 mg/dL (ref 0.61–1.24)
GFR calc Af Amer: >60 mL/min (ref >60)
GFR calc non Af Amer: >60 mL/min (ref >60)
Glucose, Bld: 70 mg/dL (ref 70–99)
Potassium: 3.5 mmol/L (ref 3.5–5.1)
Sodium: 141 mmol/L (ref 135–145)

## 2019-05-10 LAB — GLUCOSE, CAPILLARY
Glucose-Capillary: 70 mg/dL (ref 70–99)
Glucose-Capillary: 72 mg/dL (ref 70–99)
Glucose-Capillary: 73 mg/dL (ref 70–99)
Glucose-Capillary: 78 mg/dL (ref 70–99)
Glucose-Capillary: 79 mg/dL (ref 70–99)

## 2019-05-10 LAB — MAGNESIUM: Magnesium: 2.2 mg/dL (ref 1.7–2.4)

## 2019-05-10 LAB — PHOSPHORUS: Phosphorus: 2.6 mg/dL (ref 2.5–4.6)

## 2019-05-10 LAB — HEMOGLOBIN A1C
Hgb A1c MFr Bld: 8.6 % — ABNORMAL HIGH (ref 4.8–5.6)
Mean Plasma Glucose: 200.12 mg/dL

## 2019-05-10 MED ORDER — HYDRALAZINE HCL 20 MG/ML IJ SOLN: 10.0000 mg | INTRAMUSCULAR | Status: DC | PRN

## 2019-05-10 MED ORDER — HYDRALAZINE HCL 20 MG/ML IJ SOLN
10.0000 mg | Freq: Four times a day (QID) | INTRAMUSCULAR | Status: DC | PRN
  Administered 2019-05-10: 10 mg via INTRAVENOUS
  Filled 2019-05-10: qty 1

## 2019-05-10 MED ORDER — ENOXAPARIN SODIUM 60 MG/0.6ML ~~LOC~~ SOLN
60.0000 mg | SUBCUTANEOUS | Status: DC
  Administered 2019-05-10 – 2019-05-11 (×2): 60 mg via SUBCUTANEOUS
  Filled 2019-05-10 (×2): qty 0.6

## 2019-05-10 MED ORDER — BISACODYL 10 MG RE SUPP
10.0000 mg | Freq: Two times a day (BID) | RECTAL | Status: DC
  Administered 2019-05-10 – 2019-05-12 (×5): 10 mg via RECTAL
  Filled 2019-05-10 (×5): qty 1

## 2019-05-10 MED ORDER — LABETALOL HCL 5 MG/ML IV SOLN
10.0000 mg | Freq: Four times a day (QID) | INTRAVENOUS | Status: DC | PRN
  Administered 2019-05-10: 10 mg via INTRAVENOUS
  Filled 2019-05-10: qty 4

## 2019-05-10 MED ORDER — HYDRALAZINE HCL 20 MG/ML IJ SOLN
10.0000 mg | INTRAMUSCULAR | Status: DC
  Administered 2019-05-10 – 2019-05-11 (×6): 10 mg via INTRAVENOUS
  Filled 2019-05-10 (×7): qty 1

## 2019-05-10 NOTE — Progress Notes (Signed)
PROGRESS NOTE  Vincent Ramos  DOB: 23-Apr-1951  PCP: [name], MD [id number]  DOA: 05/08/2019  LOS: 2 days   Brief narrative: Patient is a 68 y.o. male with PMH of hypertension, diabetes mellitus type 2, CAD, diastolic CHF, osteoarthritis, BPH. He presented to the ED on 10/7 due to abdominal pain, constipation  and nausea vomiting for 2 days.    In the ED, patient no fever, blood pressure elevated to 219/109. Work-up showed WBC count normal at 9.4, creatinine 1.25, lipase normal. CT abdomen/pelvis with contrast showed dilated fluid-filled stomach, proximal and mid small bowel with transition to decompressed distal small bowel in the mid abdominal region to the right midline consistent with mechanical small bowel obstruction, possibly due to adhesion.    Patient admitted under hospitalist service for small bowel obstruction.  Subjective: Patient was seen and examined this morning.  Lying down in bed.  Has coffee-ground gastric contents in the canister.  Passing flatus.  No bowel movement yet.  Assessment/Plan: Small bowel obstruction of unknown etiology -Currently on conservative management with IV fluid, IV pain medicine, NG tube decompression. -General surgery consult appreciated. -Bowel sound present. -Passing flatus but no bowel movement yet.  Continues to have mild abdominal pain. -Currently on half-normal saline with potassium rider at 100 mL/h -Pain management with IV Dilaudid 1 mg every 4 hours as needed  Type 2 diabetes mellitus -Last A1c from 2017 and was 9.3. -Hemoglobin A1c this a.m. pending. -Home meds include Tresiba 60 units daily and Trulicity 1.5 mg weekly. -Patient is allergic to insulin aspart with rashes and hives.  Sliding scale not available with regular insulin. -fingersticks for ACHS and 3 am.  If he is blood sugar level exceeds 200, consider starting him on insulin drip with regular insulin. -Current blood sugar readings are less than 100.   Cardiovascular issues: HTN / HLD / diastolic CHF / CAD -Home meds include aspirin, statin, hydralazine, nebivolol, olmesartan/HCTZ, potassium supplementation -Currently has 1+ bilateral pedal edema but not short of breath.  Not on CHF exacerbation.  All oral medicines are on hold.  On PRN hydralazine and PRN metoprolol.  Continue the same.  Morbid obesity - Body mass index is 43.04 kg/m. Patient has been advised to make an attempt to improve diet and exercise patterns to aid in weight loss.  Constipation -Minimize opioids. -Escalate therapy as needed.  Mobility: Encourage ambulation DVT prophylaxis:  Lovenox subcu Code Status:   Code Status: Full Code  Family Communication:  Expected Discharge:  Continue inpatient care.  Unable to discharge yet, bowel obstruction has not resolved.  Consultants:  General surgery  Procedures:  None  Antimicrobials: Anti-infectives (From admission, onward)   None      Diet Order            Diet NPO time specified  Diet effective now              Infusions:  . 0.45 % NaCl with KCl 20 mEq / L 100 mL/hr at 05/10/19 0150    Scheduled Meds: . enoxaparin (LOVENOX) injection  40 mg Subcutaneous Q24H  . metoprolol tartrate  10 mg Intravenous Q6H    PRN meds: HYDROmorphone (DILAUDID) injection, phenol, prochlorperazine   Objective: Vitals:   05/09/19 2311 05/10/19 0458  BP: (!) 179/81 (!) 181/95  Pulse: 76 79  Resp:  17  Temp: 98.8 F (37.1 C) 98.5 F (36.9 C)  SpO2: (!) 88% 94%    Intake/Output Summary (Last 24 hours) at 05/10/2019  [place] filed at 05/10/2019 0400 Gross per 24 hour  Intake 1361.64 ml  Output 950 ml  Net 411.64 ml   Filed Weights   05/08/19 1723 05/09/19 1549  Weight: (!) 154.2 kg 122.8 kg   Weight change: -31.4 kg Body mass index is 43.04 kg/m.   Physical Exam: General exam: Appears calm and comfortable.  Morbidly obese. NG tube to suction has coffee-ground color gastric contents. Skin: No  rashes, lesions or ulcers. HEENT: Atraumatic, normocephalic, supple neck, no obvious bleeding Lungs: Clear to auscultation bilaterally CVS: Regular rate and rhythm, no murmur GI/Abd soft, mild diffuse tenderness present, distended from obesity, bowel sound present CNS: Alert, awake, oriented x3 Psychiatry: Mood appropriate Extremities: Pedal edema 1+ bilaterally  Data Review: I have personally reviewed the laboratory data and studies available.  Recent Labs  Lab 05/08/19 1909  WBC 9.4  HGB 15.5  HCT 51.3  MCV 83.1  PLT 214   Recent Labs  Lab 05/08/19 1909  NA 137  K 3.6  CL 102  CO2 27  GLUCOSE 159*  BUN 14  CREATININE 1.25*  CALCIUM 8.9  MG 2.4  PHOS 3.2    Terrilee Croak, MD  [facility] 05/10/2019

## 2019-05-10 NOTE — Care Management Important Message (Signed)
Important Message  Patient Details  Name: Vincent Ramos MRN: [id number] Date of Birth: Jun 15, 1951   Medicare Important Message Given:  Yes(given to RN to deliver to patient due to contact precautions)     Tommy Medal 05/10/2019, 12:33 PM

## 2019-05-10 NOTE — Progress Notes (Signed)
Patient's B/P 208/100 Hydralazine 10 mg administered to patient per PRN order. Informed Public house manager. Patient will continue to be monitored throughout the shift.

## 2019-05-10 NOTE — Progress Notes (Signed)
  Subjective: Patient denies abdominal pain.  States he is passing flatus.  Objective: Vital signs in last 24 hours: Temp:  [98.2 F (36.8 C)-98.8 F (37.1 C)] 98.5 F (36.9 C) (10/09 0458) Pulse Rate:  [76-82] 79 (10/09 0458) Resp:  [16-18] 17 (10/09 0458) BP: (112-181)/(63-95) 181/95 (10/09 0458) SpO2:  [86 %-94 %] 94 % (10/09 0458) Weight:  [122.8 kg] 122.8 kg (10/08 1549) Last BM Date: 05/07/19  Intake/Output from previous day: 10/08 0701 - 10/09 0700 In: 1361.6 [P.O.:240; I.V.:1121.6] Out: 950 [Emesis/NG output:950] Intake/Output this shift: Total I/O In: 120 [P.O.:120] Out: 760 [Urine:760]  General appearance: alert, cooperative and no distress GI: Soft, rotund.  Occasional bowel sounds appreciated.  No tenderness or rigidity are noted.  Lab Results:  Recent Labs    05/08/19 1909 05/10/19 0757  WBC 9.4 6.9  HGB 15.5 13.8  HCT 51.3 45.8  PLT 214 175   BMET Recent Labs    05/08/19 1909 05/10/19 0757  NA 137 141  K 3.6 3.5  CL 102 103  CO2 27 28  GLUCOSE 159* 70  BUN 14 16  CREATININE 1.25* 1.23  CALCIUM 8.9 8.1*   PT/INR No results for input(s): LABPROT, INR in the last 72 hours.  Studies/Results: Ct Abdomen Pelvis W Contrast  Result Date: 05/08/2019 CLINICAL DATA:  Swelling ascites constipation EXAM: CT ABDOMEN AND PELVIS WITH CONTRAST TECHNIQUE: Multidetector CT imaging of the abdomen and pelvis was performed using the standard protocol following bolus administration of intravenous contrast. CONTRAST:  145mL OMNIPAQUE IOHEXOL 300 MG/ML  SOLN COMPARISON:  None. FINDINGS: Lower chest: Lung bases demonstrate no consolidation or pleural effusion. Posterior fatty pleural thickening. Borderline cardiomegaly. Hepatobiliary: No focal liver abnormality is seen. No gallstones, gallbladder wall thickening, or biliary dilatation. Pancreas: Unremarkable. No pancreatic ductal dilatation or surrounding inflammatory changes. Spleen: Normal in size without focal  abnormality. Adrenals/Urinary Tract: Adrenal glands are unremarkable. Kidneys are normal, without renal calculi, focal lesion, or hydronephrosis. Bladder is unremarkable. Stomach/Bowel: Moderate fluid distension of the stomach. Dilated fluid-filled proximal to mid small bowel measuring up to 3.8 cm in diameter. Transition to decompressed small bowel in the mid abdomen slightly to the right of midline. Small bowel distal to this appears decompressed. Negative colon wall thickening. Negative appendix. Descending and sigmoid colon diverticula without acute inflammatory process. Vascular/Lymphatic: Mild aortic atherosclerosis. No aneurysmal dilatation. No significantly enlarged lymph nodes Reproductive: Prostate is unremarkable. Other: Negative for free air. Small amount of free fluid within the bilateral colic gutters. Musculoskeletal: Degenerative changes of the spine. No acute or suspicious osseous abnormality. IMPRESSION: 1. Dilated fluid-filled stomach, proximal and mid small bowel with transition to decompressed distal small bowel in the mid abdominal region, to the right of midline, consistent with mechanical small bowel obstruction, possibly due to adhesion. Small amount of free fluid in the abdomen but negative for free air. 2. Descending and sigmoid colon diverticular disease without acute inflammatory process. Electronically Signed   By: Donavan Foil M.D.   On: 05/08/2019 22:44    Anti-infectives: Anti-infectives (From admission, onward)   None      Assessment/Plan: Impression: Small bowel obstruction.  Decompressing nicely.  No need for acute surgical intervention at this time. Plan: Continue NG tube decompression.  Hopefully will be able to remove his NG tube in the next 24 to 48 hours.  Still with high output at this time.  LOS: 2 days    Aviva Signs 05/10/2019

## 2019-05-10 NOTE — Progress Notes (Signed)
Patient's B/P 195/80, MD has been notified and received verbal order to administer Metoprolol 10 mg IV injection at this time. Vital signs are as follows    05/10/19 1632  Vitals  BP (!) 195/80  BP Location Right Arm  BP Method Automatic  Patient Position (if appropriate) Sitting  Pulse Rate 75  Pulse Rate Source Dinamap  Resp 17  Oxygen Therapy  SpO2 92 %  O2 Device Room Air  Pain Assessment  Pain Scale 0-10  Pain Score 3  Pain Type Acute pain  Pain Location Abdomen  Pain Orientation Left;Right  Pain Descriptors / Indicators Aching;Constant;Discomfort  Pain Frequency Constant  Pain Onset On-going  Patients Stated Pain Goal 2  Pain Intervention(s) Emotional support;Environmental changes;Rest;Relaxation  Multiple Pain Sites No  POSS Scale (Pasero Opioid Sedation Scale)  POSS *See Group Information* 1-Acceptable,Awake and alert  MEWS Score  MEWS RR 0  MEWS Pulse 0  MEWS Systolic 0  MEWS LOC 0  MEWS Temp 0  MEWS Score 0  MEWS Score Color Green   Will continue to monitor patient

## 2019-05-11 ENCOUNTER — Inpatient Hospital Stay (HOSPITAL_COMMUNITY): Payer: PPO

## 2019-05-11 LAB — CBC WITH DIFFERENTIAL/PLATELET
Abs Immature Granulocytes: 0.04 10*3/uL (ref 0.00–0.07)
Basophils Absolute: 0 10*3/uL (ref 0.0–0.1)
Basophils Relative: 1 %
Eosinophils Absolute: 0.2 10*3/uL (ref 0.0–0.5)
Eosinophils Relative: 2 %
HCT: 47.4 % (ref 39.0–52.0)
Hemoglobin: 14.2 g/dL (ref 13.0–17.0)
Immature Granulocytes: 1 %
Lymphocytes Relative: 9 %
Lymphs Abs: 0.8 10*3/uL (ref 0.7–4.0)
MCH: 25 pg — ABNORMAL LOW (ref 26.0–34.0)
MCHC: 30 g/dL (ref 30.0–36.0)
MCV: 83.3 fL (ref 80.0–100.0)
Monocytes Absolute: 1.1 10*3/uL — ABNORMAL HIGH (ref 0.1–1.0)
Monocytes Relative: 13 %
Neutro Abs: 6.2 10*3/uL (ref 1.7–7.7)
Neutrophils Relative %: 74 %
Platelets: 186 10*3/uL (ref 150–400)
RBC: 5.69 MIL/uL (ref 4.22–5.81)
RDW: 17.2 % — ABNORMAL HIGH (ref 11.5–15.5)
WBC: 8.2 10*3/uL (ref 4.0–10.5)
nRBC: 0 % (ref 0.0–0.2)

## 2019-05-11 LAB — BASIC METABOLIC PANEL
Anion gap: 12 (ref 5–15)
BUN: 12 mg/dL (ref 8–23)
CO2: 27 mmol/L (ref 22–32)
Calcium: 8.2 mg/dL — ABNORMAL LOW (ref 8.9–10.3)
Chloride: 100 mmol/L (ref 98–111)
Creatinine, Ser: 1.06 mg/dL (ref 0.61–1.24)
GFR calc Af Amer: >60 mL/min (ref >60)
GFR calc non Af Amer: >60 mL/min (ref >60)
Glucose, Bld: 86 mg/dL (ref 70–99)
Potassium: 3.4 mmol/L — ABNORMAL LOW (ref 3.5–5.1)
Sodium: 139 mmol/L (ref 135–145)

## 2019-05-11 LAB — GLUCOSE, CAPILLARY
Glucose-Capillary: 82 mg/dL (ref 70–99)
Glucose-Capillary: 93 mg/dL (ref 70–99)
Glucose-Capillary: 233 mg/dL — ABNORMAL HIGH (ref 70–99)
Glucose-Capillary: 211 mg/dL — ABNORMAL HIGH (ref 70–99)
Glucose-Capillary: 235 mg/dL — ABNORMAL HIGH (ref 70–99)

## 2019-05-11 MED ORDER — NEBIVOLOL HCL 10 MG PO TABS
10.0000 mg | ORAL_TABLET | Freq: Every day | ORAL | Status: DC
  Administered 2019-05-11 – 2019-05-12 (×2): 10 mg via ORAL
  Filled 2019-05-11 (×2): qty 1

## 2019-05-11 MED ORDER — ISOSORBIDE MONONITRATE ER 60 MG PO TB24
30.0000 mg | ORAL_TABLET | Freq: Every day | ORAL | Status: DC
  Administered 2019-05-11 – 2019-05-12 (×2): 30 mg via ORAL
  Filled 2019-05-11 (×3): qty 1

## 2019-05-11 MED ORDER — HYDRALAZINE HCL 25 MG PO TABS
50.0000 mg | ORAL_TABLET | Freq: Three times a day (TID) | ORAL | Status: DC
  Administered 2019-05-11 – 2019-05-12 (×3): 50 mg via ORAL
  Filled 2019-05-11 (×3): qty 2

## 2019-05-11 MED ORDER — INSULIN GLARGINE 100 UNIT/ML ~~LOC~~ SOLN
15.0000 [IU] | Freq: Two times a day (BID) | SUBCUTANEOUS | Status: DC
  Administered 2019-05-11 – 2019-05-12 (×2): 15 [IU] via SUBCUTANEOUS
  Filled 2019-05-11 (×4): qty 0.15

## 2019-05-11 MED ORDER — HYDRALAZINE HCL 20 MG/ML IJ SOLN: 10.0000 mg | INTRAMUSCULAR | Status: DC | PRN

## 2019-05-11 MED ORDER — KCL IN DEXTROSE-NACL 20-5-0.45 MEQ/L-%-% IV SOLN
INTRAVENOUS | Status: DC
  Administered 2019-05-11: 10:00:00 via INTRAVENOUS

## 2019-05-11 MED ORDER — POLYETHYLENE GLYCOL 3350 17 G PO PACK
17.0000 g | PACK | Freq: Two times a day (BID) | ORAL | Status: DC
  Administered 2019-05-11 – 2019-05-12 (×3): 17 g via ORAL
  Filled 2019-05-11 (×3): qty 1

## 2019-05-11 NOTE — Progress Notes (Signed)
  Subjective: Patient has moved his bowels and is passing flatus.  Denies any abdominal pain.  Objective: Vital signs in last 24 hours: Temp:  [98.1 F (36.7 C)-99.7 F (37.6 C)] 99.7 F (37.6 C) (10/10 0543) Pulse Rate:  [74-87] 87 (10/10 0900) Resp:  [16-20] 18 (10/10 0900) BP: (172-214)/(69-100) 183/76 (10/10 0900) SpO2:  [92 %-95 %] 94 % (10/10 0900) Last BM Date: 05/10/19  Intake/Output from previous day: 10/09 0701 - 10/10 0700 In: 3106.9 [P.O.:600; I.V.:1906.9; NG/GT:600] Out: 2910 [Urine:2510; Emesis/NG output:400] Intake/Output this shift: No intake/output data recorded.  General appearance: alert, cooperative and no distress GI: soft, non-tender; bowel sounds normal; no masses,  no organomegaly  Lab Results:  Recent Labs    05/10/19 0757 05/11/19 0607  WBC 6.9 8.2  HGB 13.8 14.2  HCT 45.8 47.4  PLT 175 186   BMET Recent Labs    05/10/19 0757 05/11/19 0607  NA 141 139  K 3.5 3.4*  CL 103 100  CO2 28 27  GLUCOSE 70 86  BUN 16 12  CREATININE 1.23 1.06  CALCIUM 8.1* 8.2*   PT/INR No results for input(s): LABPROT, INR in the last 72 hours.  Studies/Results: No results found.  Anti-infectives: Anti-infectives (From admission, onward)   None      Assessment/Plan: Impression: Partial small bowel obstruction, resolving.  NG tube output has decreased to 400 cc.  No need for operative intervention at this time. Plan: Will remove NG tube and start full liquid diet.  Discussed with Dr. Denton Brick.  LOS: 3 days    Aviva Signs 05/11/2019

## 2019-05-11 NOTE — Progress Notes (Signed)
Patient c/o tube irritation, malposition.  Tube manually replaced and clamped.  X-ray ordered per protocol.  Will wait for results of x-ray.  Will inform next nurse.

## 2019-05-11 NOTE — Progress Notes (Signed)
PROGRESS NOTE  Vincent Ramos  DOB: 02/18/1951  PCP: [name], MD HT:[id number]  DOA: 05/08/2019  LOS: 3 days   Brief narrative: Patient is a 68 y.o. male with PMH of hypertension, diabetes mellitus type 2, CAD, diastolic CHF, osteoarthritis, BPH. He presented to the ED on 10/7 due to abdominal pain, constipation  and nausea vomiting for 2 days.    In the ED, patient no fever, blood pressure elevated to 219/109. Work-up showed WBC count normal at 9.4, creatinine 1.25, lipase normal. CT abdomen/pelvis with contrast showed dilated fluid-filled stomach, proximal and mid small bowel with transition to decompressed distal small bowel in the mid abdominal region to the right midline consistent with mechanical small bowel obstruction, possibly due to adhesion.    Patient admitted under hospitalist service for small bowel obstruction.  Subjective: Is happy to learn that he can  try liquid diet, pleased that NG tube has been removed, No emesis  Assessment/Plan: Small bowel obstruction of unknown etiology --General surgery consult appreciated. -NG tube discontinued on 05/11/2019, try liquid diet -Bowel sound present. -Passing flatus   -Pain management with IV Dilaudid 1 mg every 4 hours as needed  Type 2 diabetes mellitus -A1c 8.6 reflecting uncontrolled DM -Home meds include Tresiba 60 units daily and Trulicity 1.5 mg weekly-currently on hold -Patient is allergic to insulin aspart with rashes and hives.  Sliding scale not available with regular insulin. -Give Lantus insulin 15 units twice daily now that oral intake is resumed  Cardiovascular issues: HTN / HLD / diastolic CHF / CAD -Okay to resume hydralazine, nebivolol,  -Hold olmesartan/HCTZ-due to risk of AKI and dehydration given poor oral intake in the setting of bowel obstruction  Morbid obesity - Body mass index is 43.04 kg/m.   -Lifestyle and dietary modifications discussed  Constipation -Minimize opioids. -Escalate  therapy as needed.  Mobility: Encourage ambulation DVT prophylaxis:  Lovenox subcu Code Status:   Code Status: Full Code  Family Communication:  Expected Discharge:  -Awaiting return of bowel function and tolerance of oral intake prior to DC home   consultants:  General surgery  Procedures:  None  Antimicrobials: Anti-infectives (From admission, onward)   None      Diet Order            Diet full liquid Room service appropriate? Yes; Fluid consistency: Thin  Diet effective now              Infusions:  . dextrose 5 % and 0.45 % NaCl with KCl 20 mEq/L 20 mL/hr at 05/11/19 1558    Scheduled Meds: . bisacodyl  10 mg Rectal q12n4p  . enoxaparin (LOVENOX) injection  60 mg Subcutaneous Q24H  . hydrALAZINE  50 mg Oral TID  . isosorbide mononitrate  30 mg Oral Daily  . nebivolol  10 mg Oral Daily  . polyethylene glycol  17 g Oral BID    PRN meds: hydrALAZINE, HYDROmorphone (DILAUDID) injection, labetalol, phenol, prochlorperazine   Objective: Vitals:   05/11/19 1600 05/11/19 1649  BP: (!) 188/80 (!) 190/85  Pulse: 75   Resp: (!) 22   Temp: 98.4 F (36.9 C)   SpO2: 94%     Intake/Output Summary (Last 24 hours) at 05/11/2019 1806 Last data filed at 05/11/2019 1742 Gross per 24 hour  Intake 4065.52 ml  Output 1550 ml  Net 2515.52 ml   Filed Weights   05/08/19 1723 05/09/19 1549  Weight: (!) 154.2 kg 122.8 kg   Weight change:  Body mass  index is 43.04 kg/m.   Physical Exam: General exam: Morbidly obese, in no acute distress Skin: No rashes, lesions or ulcers. HEENT: Atraumatic, normocephalic, supple neck,  Lungs: Clear to auscultation bilaterally CVS: Regular rate and rhythm, no murmur GI/Abd -mild diffuse tenderness present, very distended, BS noted CNS: Alert, awake, oriented x3 Psychiatry: Mood appropriate Extremities: Pedal edema 1+ bilaterally  Data Review:   Recent Labs  Lab 05/08/19 1909 05/10/19 0757 05/11/19 0607  WBC 9.4 6.9 8.2   NEUTROABS  --  4.6 6.2  HGB 15.5 13.8 14.2  HCT 51.3 45.8 47.4  MCV 83.1 83.9 83.3  PLT 214 175 186   Recent Labs  Lab 05/08/19 1909 05/10/19 0757 05/11/19 0607  NA 137 141 139  K 3.6 3.5 3.4*  CL 102 103 100  CO2 27 28 27   GLUCOSE 159* 70 86  BUN 14 16 12   CREATININE 1.25* 1.23 1.06  CALCIUM 8.9 8.1* 8.2*  MG 2.4 2.2  --   PHOS 3.2 2.6  --     Roxan Hockey, MD  [facility] 05/11/2019

## 2019-05-11 NOTE — Plan of Care (Signed)

## 2019-05-12 ENCOUNTER — Other Ambulatory Visit: Payer: Self-pay

## 2019-05-12 LAB — BASIC METABOLIC PANEL
Anion gap: 10 (ref 5–15)
BUN: 7 mg/dL — ABNORMAL LOW (ref 8–23)
CO2: 27 mmol/L (ref 22–32)
Calcium: 8.3 mg/dL — ABNORMAL LOW (ref 8.9–10.3)
Chloride: 101 mmol/L (ref 98–111)
Creatinine, Ser: 1.1 mg/dL (ref 0.61–1.24)
GFR calc Af Amer: >60 mL/min (ref >60)
GFR calc non Af Amer: >60 mL/min (ref >60)
Glucose, Bld: 162 mg/dL — ABNORMAL HIGH (ref 70–99)
Potassium: 3.5 mmol/L (ref 3.5–5.1)
Sodium: 138 mmol/L (ref 135–145)

## 2019-05-12 LAB — CBC WITH DIFFERENTIAL/PLATELET
Abs Immature Granulocytes: 0.04 10*3/uL (ref 0.00–0.07)
Basophils Absolute: 0 10*3/uL (ref 0.0–0.1)
Basophils Relative: 1 %
Eosinophils Absolute: 0.2 10*3/uL (ref 0.0–0.5)
Eosinophils Relative: 3 %
HCT: 44.1 % (ref 39.0–52.0)
Hemoglobin: 13.7 g/dL (ref 13.0–17.0)
Immature Granulocytes: 1 %
Lymphocytes Relative: 8 %
Lymphs Abs: 0.5 10*3/uL — ABNORMAL LOW (ref 0.7–4.0)
MCH: 25.4 pg — ABNORMAL LOW (ref 26.0–34.0)
MCHC: 31.1 g/dL (ref 30.0–36.0)
MCV: 81.8 fL (ref 80.0–100.0)
Monocytes Absolute: 0.9 10*3/uL (ref 0.1–1.0)
Monocytes Relative: 14 %
Neutro Abs: 4.7 10*3/uL (ref 1.7–7.7)
Neutrophils Relative %: 73 %
Platelets: 171 10*3/uL (ref 150–400)
RBC: 5.39 MIL/uL (ref 4.22–5.81)
RDW: 16.3 % — ABNORMAL HIGH (ref 11.5–15.5)
WBC: 6.5 10*3/uL (ref 4.0–10.5)
nRBC: 0 % (ref 0.0–0.2)

## 2019-05-12 LAB — GLUCOSE, CAPILLARY
Glucose-Capillary: 149 mg/dL — ABNORMAL HIGH (ref 70–99)
Glucose-Capillary: 166 mg/dL — ABNORMAL HIGH (ref 70–99)
Glucose-Capillary: 156 mg/dL — ABNORMAL HIGH (ref 70–99)

## 2019-05-12 MED ORDER — POLYETHYLENE GLYCOL 3350 17 G PO PACK: 17.0000 g | PACK | Freq: Every day | ORAL | 2 refills | Status: DC

## 2019-05-12 MED ORDER — BISACODYL 10 MG RE SUPP: 10.0000 mg | Freq: Every day | RECTAL | 0 refills | Status: DC | PRN

## 2019-05-12 MED ORDER — OLMESARTAN MEDOXOMIL-HCTZ 40-25 MG PO TABS: 0.5000 | ORAL_TABLET | Freq: Every day | ORAL | 3 refills | Status: DC

## 2019-05-12 MED ORDER — ASPIRIN EC 81 MG PO TBEC: 81.0000 mg | DELAYED_RELEASE_TABLET | Freq: Every day | ORAL | 4 refills | Status: DC

## 2019-05-12 MED ORDER — SENNOSIDES-DOCUSATE SODIUM 8.6-50 MG PO TABS: 2.0000 | ORAL_TABLET | Freq: Every day | ORAL | 1 refills | Status: DC

## 2019-05-12 MED ORDER — ISOSORBIDE MONONITRATE ER 30 MG PO TB24: 30.0000 mg | ORAL_TABLET | Freq: Every day | ORAL | 5 refills | Status: DC

## 2019-05-12 MED ORDER — NEBIVOLOL HCL 10 MG PO TABS: 10.0000 mg | ORAL_TABLET | Freq: Every day | ORAL | 5 refills | Status: DC

## 2019-05-12 NOTE — Discharge Instructions (Signed)
1)Very low-salt diet advised 2)Weigh yourself daily, call if you gain more than 3 pounds in 1 day or more than 5 pounds in 1 week as your diuretic medications may need to be adjusted 3)Limit your Fluid  intake to no more than 60 ounces (1.8 Liters) per day 4)Soft diet advised 5)Please note that there have been numerous changes to her medications---a)Your Bystolic/Nebivolol is been decreased to 10 mg daily, b)Decrease Olmesartan/ hydrochlorothiazide/Benicar HCTZ to half a tablet every morning---  6) follow- up with the primary care physician within a week for recheck and reevaluation 7) avoid constipation

## 2019-05-12 NOTE — Progress Notes (Signed)
  Subjective: Patient tolerating full liquid diet well.  Has had multiple bowel movements and is passing flatus.  No abdominal pain is noted.  Objective: Vital signs in last 24 hours: Temp:  [98.3 F (36.8 C)-99.6 F (37.6 C)] 99.6 F (37.6 C) (10/11 0519) Pulse Rate:  [71-79] 79 (10/11 0519) Resp:  [18-22] 20 (10/11 0519) BP: (140-190)/(60-85) 140/77 (10/11 0519) SpO2:  [93 %-94 %] 94 % (10/11 0519) Last BM Date: 05/12/19  Intake/Output from previous day: 10/10 0701 - 10/11 0700 In: 1558.6 [P.O.:1200; I.V.:358.6] Out: 550 [Urine:550] Intake/Output this shift: No intake/output data recorded.  General appearance: alert, cooperative and no distress GI: soft, non-tender; bowel sounds normal; no masses,  no organomegaly  Lab Results:  Recent Labs    05/11/19 0607 05/12/19 0621  WBC 8.2 6.5  HGB 14.2 13.7  HCT 47.4 44.1  PLT 186 171   BMET Recent Labs    05/11/19 0607 05/12/19 0621  NA 139 138  K 3.4* 3.5  CL 100 101  CO2 27 27  GLUCOSE 86 162*  BUN 12 7*  CREATININE 1.06 1.10  CALCIUM 8.2* 8.3*   PT/INR No results for input(s): LABPROT, INR in the last 72 hours.  Studies/Results: Dg Abd 1 View  Result Date: 05/11/2019 CLINICAL DATA:  Confirm NG tube placement. EXAM: ABDOMEN - 1 VIEW COMPARISON:  CT abdomen and pelvis-05/08/2019 FINDINGS: Enteric tube tip and side port project over the expected location of the gastric fundus. Moderate gaseous distension of several rather patulous loops of small bowel with index loop of small bowel within the left mid hemiabdomen measuring 3.8 cm in diameter. A minimal amount of air seen within the descending colon. Nondiagnostic evaluation for pneumoperitoneum secondary to supine positioning and exclusion of the lower thorax. No pneumatosis or portal venous gas. Degenerative change of the lower lumbar spine and bilateral hips is suspected though incompletely evaluated. IMPRESSION: 1. Enteric tube tip and side port projected the  expected location of the gastric fundus. 2. Similar findings worrisome for early/partial small bowel obstruction. Electronically Signed   By: Sandi Mariscal M.D.   On: 05/11/2019 09:44    Anti-infectives: Anti-infectives (From admission, onward)   None      Assessment/Plan: Impression: Partial small bowel obstruction, resolving.  No need for surgical intervention. Plan: If patient tolerates regular diet well, may be discharged home from the surgery standpoint.  Discussed with Dr. Denton Brick  LOS: 4 days    Aviva Signs 05/12/2019

## 2019-05-12 NOTE — Discharge Summary (Signed)
Vincent Ramos, is a 68 y.o. male  DOB [birth year]  MRN [id number].  Admission date:  05/08/2019  Admitting Physician  Reubin Milan, MD  Discharge Date:  05/12/2019   Primary MD  [name], MD  Recommendations for primary care physician for things to follow:   -1)Very low-salt diet advised 2)Weigh yourself daily, call if you gain more than 3 pounds in 1 day or more than 5 pounds in 1 week as your diuretic medications may need to be adjusted 3)Limit your Fluid  intake to no more than 60 ounces (1.8 Liters) per day 4)Soft diet advised 5)Please note that there have been numerous changes to her medications---a)Your Bystolic/Nebivolol is been decreased to 10 mg daily, b)Decrease Olmesartan/ hydrochlorothiazide/Benicar HCTZ to half a tablet every morning---  6) follow- up with the primary care physician within a week for recheck and reevaluation 7) avoid constipation  Admission Diagnosis  Hypertension, unspecified type [I10] Complete intestinal obstruction, unspecified cause ([place]) [K56.601]  Discharge Diagnosis  Hypertension, unspecified type [I10] Complete intestinal obstruction, unspecified cause ([place]) [K56.601]    Principal Problem:   SBO (small bowel obstruction) ([place]) Active Problems:   Type 2 diabetes mellitus ([place])   Hypertension   Obesity   Grade II diastolic dysfunction   Constipation      Past Medical History:  Diagnosis Date   Arthritis    Asthma    CAD (coronary artery disease)    a. 11/2017: cath showing widely patent coronary arteries with normal left main, 30% mid LAD, luminal irregularities in the proximal and mid circumflex, and 30 to 40% stenosis in the mid RCA.   CAP (community acquired pneumonia) 07/31/2014   Enlarged prostate    Grade II diastolic dysfunction    Hypertension    Pneumonia    Pneumonia    Shortness of breath    Type 2 diabetes mellitus  ([place])     Past Surgical History:  Procedure Laterality Date   CIRCUMCISION     COLONOSCOPY WITH PROPOFOL N/A 08/08/2017   Procedure: COLONOSCOPY WITH PROPOFOL;  Surgeon: Danie Binder, MD;  [facility];  Service: Endoscopy;  Laterality: N/A;  1:00pm   ESOPHAGEAL DILATION N/A 06/07/2017   Procedure: ESOPHAGEAL DILATION;  Surgeon: Daneil Dolin, MD;  [facility];  Service: Endoscopy;  Laterality: N/A;   ESOPHAGOGASTRODUODENOSCOPY (EGD) WITH PROPOFOL N/A 06/07/2017   Procedure: ESOPHAGOGASTRODUODENOSCOPY (EGD) WITH PROPOFOL;  Surgeon: Daneil Dolin, MD;  [facility];  Service: Endoscopy;  Laterality: N/A;   HYDROCELE EXCISION  10/14/2011   Procedure: HYDROCELECTOMY ADULT;  Surgeon: Malka So, MD;  Location: [facility];  Service: Urology;  Laterality: Right;   LACERATION REPAIR     left hand   LUMBAR LAMINECTOMY/DECOMPRESSION MICRODISCECTOMY Right 10/07/2015   Procedure: Right Lumbar four-five ,lumbar five sacral-one Laminectomy;  Surgeon: Leeroy Cha, MD;  Location: [place] [facility] OR[facility];  Service: Neurosurgery;  Laterality: Right;   RIGHT/LEFT HEART CATH AND CORONARY ANGIOGRAPHY N/A 12/27/2017   Procedure: RIGHT/LEFT HEART  CATH AND CORONARY ANGIOGRAPHY;  Surgeon: Belva Crome, MD;  Location: [place] [facility];  Service: Cardiovascular;  Laterality: N/A;     HPI  from the history and physical done on the day of admission:   -- Patient coming from: Home.  I have personally briefly reviewed patient's old medical records in [place]  Chief Complaint: Abdominal pain, nausea and vomiting.  HPI: Vincent Ramos is a 68 y.o. male with medical history significant of osteoarthritis, asthma, CAD, community-acquired pneumonia history, enlarged prostate, grade 2 diastolic dysfunction, hypertension, history of pneumonia, type 2 diabetes mellitus who is coming to the emergency department due to abdominal pain, constipation x2 days and nausea/vomiting  today.  He denies diarrhea, melena or hematochezia.  No dysuria, frequency or hematuria.  No flank pain.  Denies fever, chills, sore throat, rhinorrhea, wheezing, hemoptysis, dyspnea, chest pain, palpitations, dizziness, diaphoresis, PND, orthopnea, but states he gets occasional pitting edema both lower extremities.  He denies polyuria, polydipsia, polyphagia or blurred vision.  ED Course: Initial vital signs were temperature 98 F, pulse 78, respiration 18, blood pressure 219/109 mmHg and O2 sat 95% on room air.  The patient received analgesics and antiemetics in the emergency department along with 10 mg of hydralazine IVP for elevated blood pressure.  His urinalysis showed ketonuria 5 and proteinuria of 100 mg/dL.  White count was 9.4, hemoglobin 15.5 g/dL and platelets 214.  CMP had a glucose of 159 and creatinine of 1.25 mg/dL.  All other values were within expected range.  Lipase was normal.  Imaging: CT abdomen/pelvis with contrast showed dilated fluid-filled stomach, proximal and mid small bowel with transition to decompressed distal small bowel in the mid abdominal region to the right midline consistent with mechanical small bowel obstruction, possibly due to ideation.  Please see images and full radiology report for further details.  Review of Systems: As per HPI otherwise 10 point review of systems negative.    Hospital Course:   - Brief narrative: Patientis a 68 y.o.malewith PMH of hypertension, diabetes mellitus type 2, CAD, diastolic CHF, osteoarthritis, BPH. He presented to the ED on 10/7 due to abdominal pain, constipation and nausea vomiting for 2 days.    In the ED, patient no fever, blood pressure elevated to 219/109. Work-up showed WBC count normal at 9.4, creatinine 1.25, lipase normal. CT abdomen/pelvis with contrast showed dilated fluid-filled stomach, proximal and mid small bowel with transition to decompressed distal small bowel in the mid abdominal region to the  right midline consistent with mechanical small bowel obstruction, possibly due to adhesion.   Patient admitted under hospitalist service for small bowel obstruction.   Assessment/Plan: Small bowel obstruction of unknown etiology --General surgery consult appreciated. -NG tube discontinued on 05/11/2019, tolerated liquid diet well, diet was advanced Having BMs and -Passing flatus   -No emesis. no significant pain  Type 2 diabetes mellitus -A1c 8.6 reflecting uncontrolled DM -Home meds include Tresiba 60 units daily and Trulicity 1.5 mg weekly-currently on hold -Patient is allergic to insulin aspart with rashes and hives.  Sliding scale not available with regular insulin. -Follow-up with PCP for more aggressive management of diabetes  Cardiovascular issues: HTN / HLD / diastolic CHF / CAD -Bystolic changed to 10 mg daily,,  -olmesartan/HCTZ-reduce to half tablet daily  -  Morbid obesity - Body mass index is 43.04 kg/m.   -Lifestyle and dietary modifications discussed  Constipation -Minimize opioids. -Give MiraLAX every morning and Senokot-S nightly avoid constipation   consultants:  General surgery  Procedures:  Discharge Condition: Stable, may discharge home as bowel function has returned  Follow UP=== PCP for recheck within a week  Diet and Activity recommendation:  As advised  Discharge Instructions    Discharge Instructions    Call MD for:  difficulty breathing, headache or visual disturbances   Complete by: As directed    Call MD for:  persistant dizziness or light-headedness   Complete by: As directed    Call MD for:  persistant nausea and vomiting   Complete by: As directed    Call MD for:  severe uncontrolled pain   Complete by: As directed    Call MD for:  temperature >100.4   Complete by: As directed    Diet - low sodium heart healthy   Complete by: As directed    Diet Carb Modified   Complete by: As directed    Discharge instructions    Complete by: As directed    1)Very low-salt diet advised 2)Weigh yourself daily, call if you gain more than 3 pounds in 1 day or more than 5 pounds in 1 week as your diuretic medications may need to be adjusted 3)Limit your Fluid  intake to no more than 60 ounces (1.8 Liters) per day 4)Soft diet advised 5)Please note that there have been numerous changes to her medications---a)Your Bystolic/Nebivolol is been decreased to 10 mg daily, b)Decrease Olmesartan/ hydrochlorothiazide/Benicar HCTZ to half a tablet every morning---  6) follow- up with the primary care physician within a week for recheck and reevaluation 7) avoid constipation   Increase activity slowly   Complete by: As directed         Discharge Medications     Allergies as of 05/12/2019      Reactions   Amlodipine    Felt "jittery" constantly   Novolog [insulin Aspart] Hives, Itching, Rash   Victoza [liraglutide] Itching, Rash      Medication List    TAKE these medications   albuterol 108 (90 Base) MCG/ACT inhaler Commonly known as: VENTOLIN HFA Inhale 2 puffs into the lungs 2 (two) times daily as needed for wheezing or shortness of breath.   aspirin EC 81 MG tablet Take 1 tablet (81 mg total) by mouth daily with breakfast. What changed: when to take this   bisacodyl 10 MG suppository Commonly known as: DULCOLAX Place 1 suppository (10 mg total) rectally daily as needed for moderate constipation.   Breo Ellipta 100-25 MCG/INH Aepb Generic drug: fluticasone furoate-vilanterol Inhale 1 puff into the lungs daily.   Fluticasone-Salmeterol 250-50 MCG/DOSE Aepb Commonly known as: ADVAIR Inhale 1 puff into the lungs 2 (two) times daily.   hydrALAZINE 100 MG tablet Commonly known as: APRESOLINE Take 1 tablet (100 mg total) by mouth 2 (two) times daily.   isosorbide mononitrate 30 MG 24 hr tablet Commonly known as: IMDUR Take 1 tablet (30 mg total) by mouth daily. Start taking on: May 13, 2019   nebivolol  10 MG tablet Commonly known as: BYSTOLIC Take 1 tablet (10 mg total) by mouth daily. Start taking on: May 13, 2019 What changed:   medication strength  how much to take   olmesartan-hydrochlorothiazide 40-25 MG tablet Commonly known as: BENICAR HCT Take 0.5 tablets by mouth daily. What changed: how much to take   pantoprazole 40 MG tablet Commonly known as: PROTONIX Take 40 mg by mouth daily.   polyethylene glycol 17 g packet Commonly known as: MIRALAX / GLYCOLAX Take 17 g by mouth daily.  potassium chloride 10 MEQ tablet Commonly known as: KLOR-CON Take with furosemide What changed:   how much to take  how to take this  when to take this  reasons to take this   rosuvastatin 40 MG tablet Commonly known as: CRESTOR Take 40 mg by mouth daily.   senna-docusate 8.6-50 MG tablet Commonly known as: Senokot-S Take 2 tablets by mouth at bedtime.   testosterone cypionate 200 MG/ML injection Commonly known as: DEPOTESTOSTERONE CYPIONATE Inject 100 mg into the muscle once a week.   Tyler Aas FlexTouch 200 UNIT/ML Sopn Generic drug: Insulin Degludec Inject 60 Units into the skin daily.   Trulicity 1.5 0000000 Sopn Generic drug: Dulaglutide Inject 1.5 mg into the skin every Saturday.      Major procedures and Radiology Reports - PLEASE review detailed and final reports for all details, in brief -   Dg Abd 1 View  Result Date: 05/11/2019 CLINICAL DATA:  Confirm NG tube placement. EXAM: ABDOMEN - 1 VIEW COMPARISON:  CT abdomen and pelvis-05/08/2019 FINDINGS: Enteric tube tip and side port project over the expected location of the gastric fundus. Moderate gaseous distension of several rather patulous loops of small bowel with index loop of small bowel within the left mid hemiabdomen measuring 3.8 cm in diameter. A minimal amount of air seen within the descending colon. Nondiagnostic evaluation for pneumoperitoneum secondary to supine positioning and exclusion of the  lower thorax. No pneumatosis or portal venous gas. Degenerative change of the lower lumbar spine and bilateral hips is suspected though incompletely evaluated. IMPRESSION: 1. Enteric tube tip and side port projected the expected location of the gastric fundus. 2. Similar findings worrisome for early/partial small bowel obstruction. Electronically Signed   By: Sandi Mariscal M.D.   On: 05/11/2019 09:44   Ct Abdomen Pelvis W Contrast  Result Date: 05/08/2019 CLINICAL DATA:  Swelling ascites constipation EXAM: CT ABDOMEN AND PELVIS WITH CONTRAST TECHNIQUE: Multidetector CT imaging of the abdomen and pelvis was performed using the standard protocol following bolus administration of intravenous contrast. CONTRAST:  120mL OMNIPAQUE IOHEXOL 300 MG/ML  SOLN COMPARISON:  None. FINDINGS: Lower chest: Lung bases demonstrate no consolidation or pleural effusion. Posterior fatty pleural thickening. Borderline cardiomegaly. Hepatobiliary: No focal liver abnormality is seen. No gallstones, gallbladder wall thickening, or biliary dilatation. Pancreas: Unremarkable. No pancreatic ductal dilatation or surrounding inflammatory changes. Spleen: Normal in size without focal abnormality. Adrenals/Urinary Tract: Adrenal glands are unremarkable. Kidneys are normal, without renal calculi, focal lesion, or hydronephrosis. Bladder is unremarkable. Stomach/Bowel: Moderate fluid distension of the stomach. Dilated fluid-filled proximal to mid small bowel measuring up to 3.8 cm in diameter. Transition to decompressed small bowel in the mid abdomen slightly to the right of midline. Small bowel distal to this appears decompressed. Negative colon wall thickening. Negative appendix. Descending and sigmoid colon diverticula without acute inflammatory process. Vascular/Lymphatic: Mild aortic atherosclerosis. No aneurysmal dilatation. No significantly enlarged lymph nodes Reproductive: Prostate is unremarkable. Other: Negative for free air. Small  amount of free fluid within the bilateral colic gutters. Musculoskeletal: Degenerative changes of the spine. No acute or suspicious osseous abnormality. IMPRESSION: 1. Dilated fluid-filled stomach, proximal and mid small bowel with transition to decompressed distal small bowel in the mid abdominal region, to the right of midline, consistent with mechanical small bowel obstruction, possibly due to adhesion. Small amount of free fluid in the abdomen but negative for free air. 2. Descending and sigmoid colon diverticular disease without acute inflammatory process. Electronically Signed   By: Madie Reno.D.  On: 05/08/2019 22:44    Micro Results    Recent Results (from the past 240 hour(s))  SARS Coronavirus 2 [facility] order, Performed in [facility] hospital lab) Nasopharyngeal Nasopharyngeal Swab     Status: None   Collection Time: 05/08/19 11:07 PM   Specimen: Nasopharyngeal Swab  Result Value Ref Range Status   SARS Coronavirus 2 NEGATIVE NEGATIVE Final    Comment: (NOTE) If result is NEGATIVE SARS-CoV-2 target nucleic acids are NOT DETECTED. The SARS-CoV-2 RNA is generally detectable in upper and lower  respiratory specimens during the acute phase of infection. The lowest  concentration of SARS-CoV-2 viral copies this assay can detect is 250  copies / mL. A negative result does not preclude SARS-CoV-2 infection  and should not be used as the sole basis for treatment or other  patient management decisions.  A negative result may occur with  improper specimen collection / handling, submission of specimen other  than nasopharyngeal swab, presence of viral mutation(s) within the  areas targeted by this assay, and inadequate number of viral copies  (<250 copies / mL). A negative result must be combined with clinical  observations, patient history, and epidemiological information. If result is POSITIVE SARS-CoV-2 target nucleic acids are DETECTED. The SARS-CoV-2 RNA is generally  detectable in upper and lower  respiratory specimens dur ing the acute phase of infection.  Positive  results are indicative of active infection with SARS-CoV-2.  Clinical  correlation with patient history and other diagnostic information is  necessary to determine patient infection status.  Positive results do  not rule out bacterial infection or co-infection with other viruses. If result is PRESUMPTIVE POSTIVE SARS-CoV-2 nucleic acids MAY BE PRESENT.   A presumptive positive result was obtained on the submitted specimen  and confirmed on repeat testing.  While 2019 novel coronavirus  (SARS-CoV-2) nucleic acids may be present in the submitted sample  additional confirmatory testing may be necessary for epidemiological  and / or clinical management purposes  to differentiate between  SARS-CoV-2 and other Sarbecovirus currently known to infect humans.  If clinically indicated additional testing with an alternate test  methodology [phone]) is advised. The SARS-CoV-2 RNA is generally  detectable in upper and lower respiratory sp ecimens during the acute  phase of infection. The expected result is Negative. Fact Sheet for Patients:  StrictlyIdeas.no Fact Sheet for Healthcare Providers: BankingDealers.co.za This test is not yet approved or cleared by the [place] FDA and has been authorized for detection and/or diagnosis of SARS-CoV-2 by FDA under an Emergency Use Authorization (EUA).  This EUA will remain in effect (meaning this test can be used) for the duration of the COVID-19 declaration under Section 564(b)(1) of the Act, 21 U.S.C. section 360bbb-3(b)(1), unless the authorization is terminated or revoked sooner. Performed at [facility], [address], [place], [place] [address]        Today   [place] today has no new concerns -Eating and drinking well, having bowel movements, passing gas, -No  nausea, no vomiting, no abdominal pain          Patient has been seen and examined prior to discharge   Objective   Blood pressure 140/77, pulse 79, temperature 99.6 F (37.6 C), temperature source Oral, resp. rate 20, height 5' 6.5" (1.689 m), weight 122.8 kg, SpO2 94 %.   Intake/Output Summary (Last 24 hours) at 05/12/2019 1628 Last data filed at 05/12/2019 1500 Gross per 24 hour  Intake 960 ml  Output --  Net 960 ml    Exam Gen:- Awake Alert, no acute distress , morbidly obese HEENT:- [place].AT, No sclera icterus Neck-Supple Neck,No JVD,.  Lungs-  CTAB , good air movement bilaterally  CV- S1, S2 normal, regular Abd-  +ve B.Sounds, Abd Soft, mildly distended, significantly increased truncal adiposity, not particularly tender Extremity/Skin:-  good pulses Psych-affect is appropriate, oriented x3 Neuro-no new focal deficits, no tremors    Data Review   CBC w Diff:  Lab Results  Component Value Date   WBC 6.5 05/12/2019   HGB 13.7 05/12/2019   HCT 44.1 05/12/2019   PLT 171 05/12/2019   LYMPHOPCT 8 05/12/2019   MONOPCT 14 05/12/2019   EOSPCT 3 05/12/2019   BASOPCT 1 05/12/2019    CMP:  Lab Results  Component Value Date   NA 138 05/12/2019   K 3.5 05/12/2019   CL 101 05/12/2019   CO2 27 05/12/2019   BUN 7 (L) 05/12/2019   CREATININE 1.10 05/12/2019   CREATININE 1.09 01/04/2013   PROT 7.1 05/08/2019   ALBUMIN 3.9 05/08/2019   BILITOT 1.2 05/08/2019   ALKPHOS 70 05/08/2019   AST 16 05/08/2019   ALT 19 05/08/2019  .   Total Discharge time is about 33 minutes  Roxan Hockey M.D on 05/12/2019 at 4:28 PM  Go to [email or web address] -  for contact info  [facility] - Office  [phone]

## 2019-05-12 NOTE — Progress Notes (Signed)
Patient discharged home. Discharged instruction complete. IV removed. 2x2 gauze applied. Patient tolerated well. Discharged home with Wife trasnported via family vehicle.

## 2019-05-21 DIAGNOSIS — N400 Enlarged prostate without lower urinary tract symptoms: Secondary | ICD-10-CM | POA: Diagnosis not present

## 2019-05-21 DIAGNOSIS — G8929 Other chronic pain: Secondary | ICD-10-CM | POA: Diagnosis not present

## 2019-05-21 DIAGNOSIS — I5030 Unspecified diastolic (congestive) heart failure: Secondary | ICD-10-CM | POA: Diagnosis not present

## 2019-05-21 DIAGNOSIS — I10 Essential (primary) hypertension: Secondary | ICD-10-CM | POA: Diagnosis not present

## 2019-05-21 DIAGNOSIS — E782 Mixed hyperlipidemia: Secondary | ICD-10-CM | POA: Diagnosis not present

## 2019-05-21 DIAGNOSIS — J449 Chronic obstructive pulmonary disease, unspecified: Secondary | ICD-10-CM | POA: Diagnosis not present

## 2019-05-21 DIAGNOSIS — G9009 Other idiopathic peripheral autonomic neuropathy: Secondary | ICD-10-CM | POA: Diagnosis not present

## 2019-05-21 DIAGNOSIS — E1169 Type 2 diabetes mellitus with other specified complication: Secondary | ICD-10-CM | POA: Diagnosis not present

## 2019-05-21 DIAGNOSIS — R809 Proteinuria, unspecified: Secondary | ICD-10-CM | POA: Diagnosis not present

## 2019-05-21 DIAGNOSIS — K56609 Unspecified intestinal obstruction, unspecified as to partial versus complete obstruction: Secondary | ICD-10-CM | POA: Diagnosis not present

## 2019-05-21 DIAGNOSIS — M545 Low back pain: Secondary | ICD-10-CM | POA: Diagnosis not present

## 2019-06-04 ENCOUNTER — Other Ambulatory Visit: Payer: Self-pay | Admitting: Student

## 2019-06-19 DIAGNOSIS — E1165 Type 2 diabetes mellitus with hyperglycemia: Secondary | ICD-10-CM | POA: Diagnosis not present

## 2019-06-19 DIAGNOSIS — I5030 Unspecified diastolic (congestive) heart failure: Secondary | ICD-10-CM | POA: Diagnosis not present

## 2019-06-19 DIAGNOSIS — I10 Essential (primary) hypertension: Secondary | ICD-10-CM | POA: Diagnosis not present

## 2019-06-19 DIAGNOSIS — N400 Enlarged prostate without lower urinary tract symptoms: Secondary | ICD-10-CM | POA: Diagnosis not present

## 2019-06-19 DIAGNOSIS — J449 Chronic obstructive pulmonary disease, unspecified: Secondary | ICD-10-CM | POA: Diagnosis not present

## 2019-06-19 DIAGNOSIS — E1169 Type 2 diabetes mellitus with other specified complication: Secondary | ICD-10-CM | POA: Diagnosis not present

## 2019-06-19 DIAGNOSIS — Z0000 Encounter for general adult medical examination without abnormal findings: Secondary | ICD-10-CM | POA: Diagnosis not present

## 2019-06-19 DIAGNOSIS — M545 Low back pain: Secondary | ICD-10-CM | POA: Diagnosis not present

## 2019-06-19 DIAGNOSIS — E782 Mixed hyperlipidemia: Secondary | ICD-10-CM | POA: Diagnosis not present

## 2019-06-19 DIAGNOSIS — E1129 Type 2 diabetes mellitus with other diabetic kidney complication: Secondary | ICD-10-CM | POA: Diagnosis not present

## 2019-06-19 DIAGNOSIS — G8929 Other chronic pain: Secondary | ICD-10-CM | POA: Diagnosis not present

## 2019-06-19 DIAGNOSIS — R809 Proteinuria, unspecified: Secondary | ICD-10-CM | POA: Diagnosis not present

## 2019-06-19 DIAGNOSIS — G9009 Other idiopathic peripheral autonomic neuropathy: Secondary | ICD-10-CM | POA: Diagnosis not present

## 2019-06-20 DIAGNOSIS — R809 Proteinuria, unspecified: Secondary | ICD-10-CM | POA: Diagnosis not present

## 2019-06-20 DIAGNOSIS — E782 Mixed hyperlipidemia: Secondary | ICD-10-CM | POA: Diagnosis not present

## 2019-06-20 DIAGNOSIS — N400 Enlarged prostate without lower urinary tract symptoms: Secondary | ICD-10-CM | POA: Diagnosis not present

## 2019-06-20 DIAGNOSIS — I5030 Unspecified diastolic (congestive) heart failure: Secondary | ICD-10-CM | POA: Diagnosis not present

## 2019-06-20 DIAGNOSIS — J449 Chronic obstructive pulmonary disease, unspecified: Secondary | ICD-10-CM | POA: Diagnosis not present

## 2019-06-20 DIAGNOSIS — Z23 Encounter for immunization: Secondary | ICD-10-CM | POA: Diagnosis not present

## 2019-06-20 DIAGNOSIS — I10 Essential (primary) hypertension: Secondary | ICD-10-CM | POA: Diagnosis not present

## 2019-06-20 DIAGNOSIS — G8929 Other chronic pain: Secondary | ICD-10-CM | POA: Diagnosis not present

## 2019-06-20 DIAGNOSIS — M545 Low back pain: Secondary | ICD-10-CM | POA: Diagnosis not present

## 2019-06-20 DIAGNOSIS — Z712 Person consulting for explanation of examination or test findings: Secondary | ICD-10-CM | POA: Diagnosis not present

## 2019-06-20 DIAGNOSIS — G9009 Other idiopathic peripheral autonomic neuropathy: Secondary | ICD-10-CM | POA: Diagnosis not present

## 2019-06-20 DIAGNOSIS — E1169 Type 2 diabetes mellitus with other specified complication: Secondary | ICD-10-CM | POA: Diagnosis not present

## 2019-06-20 DIAGNOSIS — Z0001 Encounter for general adult medical examination with abnormal findings: Secondary | ICD-10-CM | POA: Diagnosis not present

## 2019-06-25 DIAGNOSIS — E119 Type 2 diabetes mellitus without complications: Secondary | ICD-10-CM | POA: Diagnosis not present

## 2019-06-25 DIAGNOSIS — J441 Chronic obstructive pulmonary disease with (acute) exacerbation: Secondary | ICD-10-CM | POA: Diagnosis not present

## 2019-07-09 DIAGNOSIS — I110 Hypertensive heart disease with heart failure: Secondary | ICD-10-CM | POA: Diagnosis not present

## 2019-07-09 DIAGNOSIS — G8929 Other chronic pain: Secondary | ICD-10-CM | POA: Diagnosis not present

## 2019-07-09 DIAGNOSIS — N400 Enlarged prostate without lower urinary tract symptoms: Secondary | ICD-10-CM | POA: Diagnosis not present

## 2019-07-09 DIAGNOSIS — E1169 Type 2 diabetes mellitus with other specified complication: Secondary | ICD-10-CM | POA: Diagnosis not present

## 2019-07-09 DIAGNOSIS — G9009 Other idiopathic peripheral autonomic neuropathy: Secondary | ICD-10-CM | POA: Diagnosis not present

## 2019-07-09 DIAGNOSIS — I5030 Unspecified diastolic (congestive) heart failure: Secondary | ICD-10-CM | POA: Diagnosis not present

## 2019-07-09 DIAGNOSIS — Z712 Person consulting for explanation of examination or test findings: Secondary | ICD-10-CM | POA: Diagnosis not present

## 2019-07-09 DIAGNOSIS — Z0001 Encounter for general adult medical examination with abnormal findings: Secondary | ICD-10-CM | POA: Diagnosis not present

## 2019-07-12 DIAGNOSIS — E291 Testicular hypofunction: Secondary | ICD-10-CM | POA: Diagnosis not present

## 2019-07-16 ENCOUNTER — Other Ambulatory Visit: Payer: Self-pay | Admitting: Urology

## 2019-07-22 DIAGNOSIS — N401 Enlarged prostate with lower urinary tract symptoms: Secondary | ICD-10-CM | POA: Diagnosis not present

## 2019-07-22 DIAGNOSIS — E291 Testicular hypofunction: Secondary | ICD-10-CM | POA: Diagnosis not present

## 2019-07-22 DIAGNOSIS — N5201 Erectile dysfunction due to arterial insufficiency: Secondary | ICD-10-CM | POA: Diagnosis not present

## 2019-07-22 DIAGNOSIS — N3941 Urge incontinence: Secondary | ICD-10-CM | POA: Diagnosis not present

## 2019-08-12 DIAGNOSIS — R809 Proteinuria, unspecified: Secondary | ICD-10-CM | POA: Diagnosis not present

## 2019-08-12 DIAGNOSIS — Z712 Person consulting for explanation of examination or test findings: Secondary | ICD-10-CM | POA: Diagnosis not present

## 2019-08-12 DIAGNOSIS — I129 Hypertensive chronic kidney disease with stage 1 through stage 4 chronic kidney disease, or unspecified chronic kidney disease: Secondary | ICD-10-CM | POA: Diagnosis not present

## 2019-08-12 DIAGNOSIS — Z0001 Encounter for general adult medical examination with abnormal findings: Secondary | ICD-10-CM | POA: Diagnosis not present

## 2019-08-12 DIAGNOSIS — N400 Enlarged prostate without lower urinary tract symptoms: Secondary | ICD-10-CM | POA: Diagnosis not present

## 2019-08-12 DIAGNOSIS — G8929 Other chronic pain: Secondary | ICD-10-CM | POA: Diagnosis not present

## 2019-08-12 DIAGNOSIS — J449 Chronic obstructive pulmonary disease, unspecified: Secondary | ICD-10-CM | POA: Diagnosis not present

## 2019-08-12 DIAGNOSIS — G9009 Other idiopathic peripheral autonomic neuropathy: Secondary | ICD-10-CM | POA: Diagnosis not present

## 2019-08-12 DIAGNOSIS — I10 Essential (primary) hypertension: Secondary | ICD-10-CM | POA: Diagnosis not present

## 2019-08-12 DIAGNOSIS — I5030 Unspecified diastolic (congestive) heart failure: Secondary | ICD-10-CM | POA: Diagnosis not present

## 2019-08-12 DIAGNOSIS — E1169 Type 2 diabetes mellitus with other specified complication: Secondary | ICD-10-CM | POA: Diagnosis not present

## 2019-08-12 DIAGNOSIS — E782 Mixed hyperlipidemia: Secondary | ICD-10-CM | POA: Diagnosis not present

## 2019-08-29 ENCOUNTER — Other Ambulatory Visit: Payer: Self-pay | Admitting: Orthopedic Surgery

## 2019-08-29 DIAGNOSIS — M542 Cervicalgia: Secondary | ICD-10-CM | POA: Diagnosis not present

## 2019-09-03 DIAGNOSIS — J441 Chronic obstructive pulmonary disease with (acute) exacerbation: Secondary | ICD-10-CM | POA: Diagnosis not present

## 2019-09-03 DIAGNOSIS — E1165 Type 2 diabetes mellitus with hyperglycemia: Secondary | ICD-10-CM | POA: Diagnosis not present

## 2019-09-03 DIAGNOSIS — I25118 Atherosclerotic heart disease of native coronary artery with other forms of angina pectoris: Secondary | ICD-10-CM | POA: Diagnosis not present

## 2019-09-03 DIAGNOSIS — I110 Hypertensive heart disease with heart failure: Secondary | ICD-10-CM | POA: Diagnosis not present

## 2019-09-03 DIAGNOSIS — E662 Morbid (severe) obesity with alveolar hypoventilation: Secondary | ICD-10-CM | POA: Diagnosis not present

## 2019-09-03 DIAGNOSIS — I5032 Chronic diastolic (congestive) heart failure: Secondary | ICD-10-CM | POA: Diagnosis not present

## 2019-09-03 DIAGNOSIS — Z79899 Other long term (current) drug therapy: Secondary | ICD-10-CM | POA: Diagnosis not present

## 2019-09-03 DIAGNOSIS — E785 Hyperlipidemia, unspecified: Secondary | ICD-10-CM | POA: Diagnosis not present

## 2019-09-03 DIAGNOSIS — N400 Enlarged prostate without lower urinary tract symptoms: Secondary | ICD-10-CM | POA: Diagnosis not present

## 2019-09-03 DIAGNOSIS — K5900 Constipation, unspecified: Secondary | ICD-10-CM | POA: Diagnosis not present

## 2019-09-03 DIAGNOSIS — I700 Atherosclerosis of aorta: Secondary | ICD-10-CM | POA: Diagnosis not present

## 2019-09-03 DIAGNOSIS — Z6841 Body Mass Index (BMI) 40.0 and over, adult: Secondary | ICD-10-CM | POA: Diagnosis not present

## 2019-09-03 DIAGNOSIS — M5412 Radiculopathy, cervical region: Secondary | ICD-10-CM | POA: Diagnosis not present

## 2019-09-16 ENCOUNTER — Telehealth: Payer: Self-pay

## 2019-09-16 NOTE — Telephone Encounter (Signed)
NOTES ON FILE FROM [facility] [phone], SENT REFERRAL TO SCHEDULING 

## 2019-09-18 ENCOUNTER — Other Ambulatory Visit: Payer: Self-pay

## 2019-09-18 ENCOUNTER — Ambulatory Visit
Admission: RE | Admit: 2019-09-18 | Discharge: 2019-09-18 | Disposition: A | Discharge status: Home / Self Care | Payer: PPO | Source: Ambulatory Visit | Attending: Orthopedic Surgery | Admitting: Orthopedic Surgery

## 2019-09-18 DIAGNOSIS — M542 Cervicalgia: Secondary | ICD-10-CM

## 2019-09-18 DIAGNOSIS — M4802 Spinal stenosis, cervical region: Secondary | ICD-10-CM | POA: Diagnosis not present

## 2019-09-23 DIAGNOSIS — M542 Cervicalgia: Secondary | ICD-10-CM | POA: Diagnosis not present

## 2019-09-25 DIAGNOSIS — Z008 Encounter for other general examination: Secondary | ICD-10-CM | POA: Diagnosis not present

## 2019-09-25 DIAGNOSIS — J449 Chronic obstructive pulmonary disease, unspecified: Secondary | ICD-10-CM | POA: Diagnosis not present

## 2019-09-25 DIAGNOSIS — D509 Iron deficiency anemia, unspecified: Secondary | ICD-10-CM | POA: Diagnosis not present

## 2019-09-25 DIAGNOSIS — E1151 Type 2 diabetes mellitus with diabetic peripheral angiopathy without gangrene: Secondary | ICD-10-CM | POA: Diagnosis not present

## 2019-09-25 DIAGNOSIS — Z6841 Body Mass Index (BMI) 40.0 and over, adult: Secondary | ICD-10-CM | POA: Diagnosis not present

## 2019-09-25 DIAGNOSIS — E1136 Type 2 diabetes mellitus with diabetic cataract: Secondary | ICD-10-CM | POA: Diagnosis not present

## 2019-09-25 DIAGNOSIS — Z7189 Other specified counseling: Secondary | ICD-10-CM | POA: Diagnosis not present

## 2019-09-25 DIAGNOSIS — E1165 Type 2 diabetes mellitus with hyperglycemia: Secondary | ICD-10-CM | POA: Diagnosis not present

## 2019-09-25 DIAGNOSIS — I5032 Chronic diastolic (congestive) heart failure: Secondary | ICD-10-CM | POA: Diagnosis not present

## 2019-09-25 DIAGNOSIS — Z0001 Encounter for general adult medical examination with abnormal findings: Secondary | ICD-10-CM | POA: Diagnosis not present

## 2019-09-25 DIAGNOSIS — I700 Atherosclerosis of aorta: Secondary | ICD-10-CM | POA: Diagnosis not present

## 2019-09-25 DIAGNOSIS — E662 Morbid (severe) obesity with alveolar hypoventilation: Secondary | ICD-10-CM | POA: Diagnosis not present

## 2019-09-25 DIAGNOSIS — E785 Hyperlipidemia, unspecified: Secondary | ICD-10-CM | POA: Diagnosis not present

## 2019-09-26 ENCOUNTER — Telehealth: Payer: Self-pay

## 2019-09-26 NOTE — Telephone Encounter (Signed)
NOTES ON FILE FROM [facility] [phone], SENT REFERRAL TO SCHEDULING 

## 2019-09-27 ENCOUNTER — Telehealth: Payer: Self-pay | Admitting: *Deleted

## 2019-09-27 DIAGNOSIS — M4802 Spinal stenosis, cervical region: Secondary | ICD-10-CM | POA: Diagnosis not present

## 2019-09-27 DIAGNOSIS — G992 Myelopathy in diseases classified elsewhere: Secondary | ICD-10-CM | POA: Diagnosis not present

## 2019-09-27 DIAGNOSIS — I10 Essential (primary) hypertension: Secondary | ICD-10-CM | POA: Diagnosis not present

## 2019-09-27 DIAGNOSIS — Z6841 Body Mass Index (BMI) 40.0 and over, adult: Secondary | ICD-10-CM | POA: Diagnosis not present

## 2019-09-27 NOTE — Telephone Encounter (Signed)
I will send FYI to Dr. Johnsie Cancel as well.

## 2019-09-27 NOTE — Telephone Encounter (Signed)
I s/w pt and explained that he is going to need an appt for pre op clearance. Pt states he told Dr. Johnsie Cancel last yr when he saw him that he did not want to see him anymore and wants a new Cardiologist. Pt states his PCP set him up with another Cardiologist. Pt states we do not need to give clearance for his surgery. I advised the pt that he may want to call the surgeon's office and let them know that he is no longer wanting to be seen by our practice. Pt said he will call the surgeon's office. I will send notes to pre op team and the surgeon Dr. Duffy Rhody as Juluis Rainier.

## 2019-09-27 NOTE — Telephone Encounter (Signed)
   [facility] Pre-operative Risk Assessment    Request for surgical clearance:  1. What type of surgery is being performed? LUMBAR FUSION   2. When is this surgery scheduled? TBD   3. What type of clearance is required (medical clearance vs. Pharmacy clearance to hold med vs. Both)? MEDICAL  4. Are there any medications that need to be held prior to surgery and how long? ASA   5. Practice name and name of physician performing surgery? [place]; DR. Roderic Palau THOMAS   6. What is your office phone number [phone]    7.   What is your office fax number [phone] ATTN: [email or web address]  8.   Anesthesia type (None, local, MAC, general) ? GENERAL   Julaine Hua 09/27/2019, 3:02 PM  _________________________________________________________________   (provider comments below)

## 2019-09-27 NOTE — Telephone Encounter (Signed)
   Primary Cardiologist:Peter Johnsie Cancel, MD  Chart reviewed as part of pre-operative protocol coverage. Because of Vincent Ramos's past medical history and time since last visit, he/she will require a follow-up visit in order to better assess preoperative cardiovascular risk.  Pre-op covering staff: - Please schedule appointment and call patient to inform them. - Please contact requesting surgeon's office via preferred method (i.e, phone, fax) to inform them of need for appointment prior to surgery.  If applicable, this message will also be routed to pharmacy pool and/or primary cardiologist for input on holding anticoagulant/antiplatelet agent as requested below so that this information is available at time of patient's appointment.   [place], [place]  09/27/2019, 3:48 PM

## 2019-10-02 ENCOUNTER — Telehealth: Payer: Self-pay | Admitting: Cardiovascular Disease

## 2019-10-02 NOTE — Telephone Encounter (Signed)
Reviewed my last note and it indicates he only needed to be seen PRN He does not have heart issues only HLD and HTN which his primary should take care of My note also indicates he is a difficult and non compliant patient Personally I would have him f/u with primary and only be seen as new patient for re-referral I certainly do not need to see him again

## 2019-10-02 NOTE — Telephone Encounter (Signed)
F/u with PMD.  OK for me to see him when needed.   JV

## 2019-10-02 NOTE — Telephone Encounter (Signed)
New Message    Pt is requesting to switch from Dr. Johnsie Cancel to Dr. Irish Lack  Please advice

## 2019-10-03 ENCOUNTER — Institutional Professional Consult (permissible substitution): Payer: PPO | Admitting: Internal Medicine

## 2019-10-10 ENCOUNTER — Institutional Professional Consult (permissible substitution): Payer: PPO | Admitting: Internal Medicine

## 2019-10-14 ENCOUNTER — Telehealth: Payer: Self-pay | Admitting: *Deleted

## 2019-10-14 NOTE — Telephone Encounter (Signed)
   [facility] Pre-operative Risk Assessment    Request for surgical clearance: PT HAS APPT WITH DR. VARANASI 11/11/19  1. What type of surgery is being performed? CERVICAL FUSION   2. When is this surgery scheduled? TBD   3. What type of clearance is required (medical clearance vs. Pharmacy clearance to hold med vs. Both)? MEDICAL  4. Are there any medications that need to be held prior to surgery and how long? ASA   5. Practice name and name of physician performing surgery? [place]; DR. Roderic Palau THOMAS   6. What is your office phone number [phone]    7.   What is your office fax number [phone] ATTN: [email or web address]  8.   Anesthesia type (None, local, MAC, general) ? GENERAL   Vincent Ramos 10/14/2019, 2:31 PM  _________________________________________________________________   (provider comments below)

## 2019-10-14 NOTE — Telephone Encounter (Signed)
   Primary Cardiologist:Peter Johnsie Cancel, MD  Chart reviewed as part of pre-operative protocol coverage. Because of Vincent Ramos's past medical history and time since last visit, he/she will require a follow-up visit in order to better assess preoperative cardiovascular risk.  Pre-op covering staff: - Please contact requesting surgeon's office via preferred method (i.e, phone, fax) to inform them of need for appointment prior to surgery.   Patient has appointment next month.   [place], [place]  10/14/2019, 2:58 PM

## 2019-10-14 NOTE — Telephone Encounter (Signed)
Pt has appt with Dr. Irish Lack 11/11/19. I will forward clearance request to Dr. Irish Lack for upcoming appt. I will send FYI to Dr. Duffy Rhody. I will remove from the pre op call back pool.

## 2019-10-15 ENCOUNTER — Ambulatory Visit: Payer: PPO | Admitting: Critical Care Medicine

## 2019-10-15 ENCOUNTER — Encounter: Payer: Self-pay | Admitting: Critical Care Medicine

## 2019-10-15 ENCOUNTER — Other Ambulatory Visit: Payer: Self-pay

## 2019-10-15 DIAGNOSIS — J309 Allergic rhinitis, unspecified: Secondary | ICD-10-CM | POA: Diagnosis not present

## 2019-10-15 DIAGNOSIS — J4530 Mild persistent asthma, uncomplicated: Secondary | ICD-10-CM

## 2019-10-15 MED ORDER — AZELASTINE HCL 0.1 % NA SOLN: 2.0000 | Freq: Two times a day (BID) | NASAL | 12 refills | Status: DC

## 2019-10-15 NOTE — Progress Notes (Signed)
Synopsis: Referred in March 2021 for COPD by Sonia Side., FNP  Subjective:   PATIENT ID: Vincent Ramos GENDER: male DOB: [birth year], MRN: [id number]  Chief Complaint  Patient presents with  . Consult    Patient is here to establish care for COPD. Patient has good days and bad days with his breathing.     Vincent Ramos is a 69 y/o gentleman with a history of HFpEF, COPD, obesity who presents for evaluation.  He was previously patient of Dr. Luan Pulling who has recently retired.  He has minimal past smoking history, 1 to 2 cigarettes occasionally when he was younger.  He never was an everyday smoker.  He takes Symbicort 80/4.5 twice daily and uses his rescue albuterol less than once per week.  He has allergic sinus symptoms with postnasal drip that tend to worsen his symptoms and they are uncontrolled.  He intermittently uses intranasal steroids to help control his symptoms.  When his allergies are worse he has occasional cough with yellow sputum.  He sometimes has worsening breathing symptoms for no obvious reason and questions if this could be related to things he eats.  He has no significant change in symptoms with changes in weather.  He has chronic dyspnea on exertion, but most days does not have to stop activity as long as he is moving slowly.  Some days his symptoms are worse, but improved when he uses albuterol.  He is not taking antihistamines for his allergies.  No history of GERD; not currently taking a PPI.  No symptoms of heartburn.  Family history of lung disease includes his mother with lung cancer, who was a smoker.  He has an upcoming spine surgery with Dr. Duffy Rhody from [facility].  He has cervical stenosis causing weakness in his right hand and pain radiating down his arm.     Past Medical History:  Diagnosis Date  . Arthritis   . Asthma   . CAD (coronary artery disease)    a. 11/2017: cath showing widely patent coronary arteries with normal left  main, 30% mid LAD, luminal irregularities in the proximal and mid circumflex, and 30 to 40% stenosis in the mid RCA.  Marland Kitchen CAP (community acquired pneumonia) 07/31/2014  . Enlarged prostate   . Grade II diastolic dysfunction   . Hypertension   . Pneumonia   . Pneumonia   . Shortness of breath   . Type 2 diabetes mellitus (HCC)      Family History  Problem Relation Age of Onset  . Diabetes Other   . Hypertension Other   . CAD Other   . Cancer Other   . Lung cancer Mother        smoker  . Diabetes Sister   . Hypertension Sister   . Anesthesia problems Neg Hx   . Hypotension Neg Hx   . Malignant hyperthermia Neg Hx   . Pseudochol deficiency Neg Hx   . Colon cancer Neg Hx   . Colon polyps Neg Hx      Past Surgical History:  Procedure Laterality Date  . CIRCUMCISION    . COLONOSCOPY WITH PROPOFOL N/A 08/08/2017   Procedure: COLONOSCOPY WITH PROPOFOL;  Surgeon: Danie Binder, MD;  [facility];  Service: Endoscopy;  Laterality: N/A;  1:00pm  . ESOPHAGEAL DILATION N/A 06/07/2017   Procedure: ESOPHAGEAL DILATION;  Surgeon: Daneil Dolin, MD;  Location: [facility];  Service: Endoscopy;  Laterality: N/A;  . ESOPHAGOGASTRODUODENOSCOPY (  EGD) WITH PROPOFOL N/A 06/07/2017   Procedure: ESOPHAGOGASTRODUODENOSCOPY (EGD) WITH PROPOFOL;  Surgeon: Daneil Dolin, MD;  [facility];  Service: Endoscopy;  Laterality: N/A;  . HYDROCELE EXCISION  10/14/2011   Procedure: HYDROCELECTOMY ADULT;  Surgeon: Malka So, MD;  Location: [facility];  Service: Urology;  Laterality: Right;  . LACERATION REPAIR     left hand  . LUMBAR LAMINECTOMY/DECOMPRESSION MICRODISCECTOMY Right 10/07/2015   Procedure: Right Lumbar four-five ,lumbar five sacral-one Laminectomy;  Surgeon: Leeroy Cha, MD;  Location: [facility] [facility];  Service: Neurosurgery;  Laterality: Right;  . RIGHT/LEFT HEART CATH AND CORONARY ANGIOGRAPHY N/A 12/27/2017   Procedure: RIGHT/LEFT HEART CATH AND CORONARY ANGIOGRAPHY;   Surgeon: Belva Crome, MD;  Location: [facility] [facility];  Service: Cardiovascular;  Laterality: N/A;    Social History   Socioeconomic History  . Marital status: Married    Spouse name: Not on file  . Number of children: Not on file  . Years of education: Not on file  . Highest education level: Not on file  Occupational History  . Not on file  Tobacco Use  . Smoking status: Never Smoker  . Smokeless tobacco: Never Used  Substance and Sexual Activity  . Alcohol use: No    Alcohol/week: 0.0 standard drinks    Comment: rare beer  . Drug use: No  . Sexual activity: Yes    Birth control/protection: None  Other Topics Concern  . Not on file  Social History Narrative  . Not on file   Social Determinants of Health   Financial Resource Strain:   . Difficulty of Paying Living Expenses:   Food Insecurity:   . Worried About Charity fundraiser in the Last Year:   . Arboriculturist in the Last Year:   Transportation Needs:   . Film/video editor (Medical):   Marland Kitchen Lack of Transportation (Non-Medical):   Physical Activity:   . Days of Exercise per Week:   . Minutes of Exercise per Session:   Stress:   . Feeling of Stress :   Social Connections:   . Frequency of Communication with Friends and Family:   . Frequency of Social Gatherings with Friends and Family:   . Attends Religious Services:   . Active Member of Clubs or Organizations:   . Attends Archivist Meetings:   Marland Kitchen Marital Status:   Intimate Partner Violence:   . Fear of Current or Ex-Partner:   . Emotionally Abused:   Marland Kitchen Physically Abused:   . Sexually Abused:      Allergies  Allergen Reactions  . Amlodipine     Felt "jittery" constantly  . Novolog [Insulin Aspart] Hives, Itching and Rash  . Victoza [Liraglutide] Itching and Rash     Immunization History  Administered Date(s) Administered  . Influenza,inj,Quad PF,6+ Mos 08/01/2014, 05/31/2017  . PFIZER SARS-COV-2 Vaccination 08/27/2019,  09/17/2019  . Pneumococcal Polysaccharide-23 08/01/2014    Outpatient Medications Prior to Visit  Medication Sig Dispense Refill  . albuterol (VENTOLIN HFA) 108 (90 Base) MCG/ACT inhaler Inhale 2 puffs into the lungs 2 (two) times daily as needed for wheezing or shortness of breath.    Marland Kitchen aspirin EC 81 MG tablet Take 1 tablet (81 mg total) by mouth daily with breakfast. 30 tablet 4  . budesonide-formoterol (SYMBICORT) 160-4.5 MCG/ACT inhaler Inhale 2 puffs into the lungs 2 (two) times daily.    Marland Kitchen gabapentin (NEURONTIN) 300 MG capsule Take 300 mg by mouth 3 (  three) times daily.    . hydrALAZINE (APRESOLINE) 100 MG tablet TAKE 1 TABLET BY MOUTH 2 TIMES DAILY. 180 tablet 3  . ipratropium-albuterol (DUONEB) 0.5-2.5 (3) MG/3ML SOLN Inhale 1 mL into the lungs as needed.    . isosorbide mononitrate (IMDUR) 30 MG 24 hr tablet Take 1 tablet (30 mg total) by mouth daily. 30 tablet 5  . montelukast (SINGULAIR) 10 MG tablet Take 10 mg by mouth at bedtime.    . nebivolol (BYSTOLIC) 10 MG tablet Take 1 tablet (10 mg total) by mouth daily. 30 tablet 5  . olmesartan-hydrochlorothiazide (BENICAR HCT) 40-25 MG tablet Take 0.5 tablets by mouth daily. 45 tablet 3  . polyethylene glycol (MIRALAX / GLYCOLAX) 17 g packet Take 17 g by mouth daily. 30 each 2  . rosuvastatin (CRESTOR) 40 MG tablet Take 40 mg by mouth daily.    Marland Kitchen testosterone cypionate (DEPOTESTOSTERONE CYPIONATE) 200 MG/ML injection Inject 100 mg into the muscle once a week.    . TRULICITY 1.5 0000000 SOPN Inject 1.5 mg into the skin every Saturday.     . bisacodyl (DULCOLAX) 10 MG suppository Place 1 suppository (10 mg total) rectally daily as needed for moderate constipation. 12 suppository 0  . fluticasone furoate-vilanterol (BREO ELLIPTA) 100-25 MCG/INH AEPB Inhale 1 puff into the lungs daily.    . Fluticasone-Salmeterol (ADVAIR) 250-50 MCG/DOSE AEPB Inhale 1 puff into the lungs 2 (two) times daily.    . Insulin Degludec (TRESIBA FLEXTOUCH) 200  UNIT/ML SOPN Inject 60 Units into the skin daily.     . pantoprazole (PROTONIX) 40 MG tablet Take 40 mg by mouth daily.    . potassium chloride (K-DUR) 10 MEQ tablet Take with furosemide (Patient taking differently: Take 10 mEq by mouth daily as needed. Take with furosemide) 90 tablet 3  . senna-docusate (SENOKOT-S) 8.6-50 MG tablet Take 2 tablets by mouth at bedtime. 60 tablet 1   No facility-administered medications prior to visit.    Review of Systems  Constitutional: Negative for chills and fever.       20# weight loss, planned  HENT: Positive for congestion.   Respiratory: Positive for cough, sputum production and shortness of breath. Negative for wheezing.   Cardiovascular: Negative for chest pain.       Chronic LE edema  Gastrointestinal: Negative for heartburn.  Neurological: Positive for tingling and focal weakness.  Endo/Heme/Allergies: Positive for environmental allergies.     Objective:   Vitals:   10/15/19 0934  BP: (!) 160/70  Pulse: 74  Temp: (!) 97.2 F (36.2 C)  TempSrc: Temporal  SpO2: 94%  Weight: 266 lb 6.4 oz (120.8 kg)  Height: 5' 6.5" (1.689 m)   94% on   RA BMI Readings from Last 3 Encounters:  10/15/19 42.35 kg/m  05/09/19 43.04 kg/m  09/17/18 44.04 kg/m   Wt Readings from Last 3 Encounters:  10/15/19 266 lb 6.4 oz (120.8 kg)  05/09/19 270 lb 11.6 oz (122.8 kg)  09/17/18 277 lb (125.6 kg)    Physical Exam Vitals reviewed.  Constitutional:      Appearance: Normal appearance. He is obese.  HENT:     Head: Normocephalic and atraumatic.  Eyes:     General: No scleral icterus. Cardiovascular:     Rate and Rhythm: Normal rate and regular rhythm.     Heart sounds: No murmur.  Pulmonary:     Comments: breathing comfortably on RA, CTAB. No conversational dyspnea. Abdominal:     General: There is no distension.  Palpations: Abdomen is soft.     Tenderness: There is no abdominal tenderness.  Musculoskeletal:        General: Swelling  present. No deformity.     Cervical back: Neck supple.  Lymphadenopathy:     Cervical: No cervical adenopathy.  Skin:    General: Skin is warm and dry.     Findings: No rash.  Neurological:     Mental Status: He is alert.     Coordination: Coordination normal.  Psychiatric:        Mood and Affect: Mood normal.        Behavior: Behavior normal.      CBC    Component Value Date/Time   WBC 6.5 05/12/2019 0621   RBC 5.39 05/12/2019 0621   HGB 13.7 05/12/2019 0621   HCT 44.1 05/12/2019 0621   PLT 171 05/12/2019 0621   MCV 81.8 05/12/2019 0621   MCH 25.4 (L) 05/12/2019 0621   MCHC 31.1 05/12/2019 0621   RDW 16.3 (H) 05/12/2019 0621   LYMPHSABS 0.5 (L) 05/12/2019 0621   MONOABS 0.9 05/12/2019 0621   EOSABS 0.2 05/12/2019 0621   BASOSABS 0.0 05/12/2019 0621    CHEMISTRY No results for input(s): NA, K, CL, CO2, GLUCOSE, BUN, CREATININE, CALCIUM, MG, PHOS in the last 168 hours. CrCl cannot be calculated (Patient's most recent lab result is older than the maximum 21 days allowed.).   Chest Imaging- films reviewed: CXR, 2 view 02/03/2018-significantly elevated right hemidiaphragm, silhouetting of cardiac apex potentially due to apical fat pad.   CT chest with contrast 06/02/2017-bilateral groundglass opacities in a bronchovascular distribution, most notable in the upper lobes.  Elevated right hemidiaphragm.  Patulous esophagus.  Mild mediastinal and hilar adenopathy.    Pulmonary Functions Testing Results: No flowsheet data found.    Echocardiogram 09/18/2017: LVEF 60 to 65%, severe LVH.  Mildly dilated LV.  Normal LA, RV.  Thickened mitral valve leaflets with normal function, thickened aortic valve leaflets without regurgitation.  Echocardiogram 09/03/2015: LVEF 60 to 65%, moderate LVH, grade 2 diastolic dysfunction.  Left atrium borderline size.  Trivial MR, mildly calcified aortic valve leaflets.  Aortic root mildly dilated.   Heart Catheterization 12/27/2017:   Widely patent  coronary arteries with normal left main, 30% mid LAD, luminal irregularities in the proximal and mid circumflex, and 30 to 40% stenosis in the mid RCA.  Mild global left ventricular systolic dysfunction with EF 40 to 45%.  Severely elevated end-diastolic pressure consistent with chronic combined systolic and diastolic heart failure.  Severe poorly controlled blood pressure during the procedure.  Severe BP elevation during the procedure requiring IV hydralazine, labetalol, and IV nitroglycerin drip.  Stable right femoral after Mynx arteriotomy closure device. RECOMMENDATIONS:  IV Lasix x1 and more aggressive outpatient diuresis Guideline directed therapy for systolic/diastolic heart failure.    Assessment & Plan:     ICD-10-CM   1. Morbid (severe) obesity due to excess calories (HCC)  E66.01   2. Mild persistent asthma without complication  [id number]   3. Allergic rhinitis, unspecified seasonality, unspecified trigger  J30.9     Mild persistent asthma; with his minimal smoking history COPD seems less likely. -Continue Symbicort twice daily.  Instructed to rinse mouth after every use -Continue albuterol as needed -Allergic rhinosinusitis management -PFT  Allergic rhinosinusitis -Continue intranasal steroids -Adding azelastine 2 sprays bilaterally twice daily -Can use intranasal saline rinses if significant thick secretions -May require antihistamines or Singulair in the future if his symptoms remain uncontrolled  Obesity;  likely chronic obesity related restriction -Recommend weight loss as a long-term goal.  Encouraged his efforts at dietary modification.  Upcoming spine surgery: ARISCAT score = 34 (intermediate risk, 13.3% risk of post-op pulmonary complications). This score assumes >3h procedure, pre-op saturations 94%, no pre-op anemia or recent respiratory infections.   RTC in 3 months or sooner PRN.   Current Outpatient Medications:  .  albuterol (VENTOLIN HFA) 108 (90  Base) MCG/ACT inhaler, Inhale 2 puffs into the lungs 2 (two) times daily as needed for wheezing or shortness of breath., Disp: , Rfl:  .  aspirin EC 81 MG tablet, Take 1 tablet (81 mg total) by mouth daily with breakfast., Disp: 30 tablet, Rfl: 4 .  budesonide-formoterol (SYMBICORT) 160-4.5 MCG/ACT inhaler, Inhale 2 puffs into the lungs 2 (two) times daily., Disp: , Rfl:  .  gabapentin (NEURONTIN) 300 MG capsule, Take 300 mg by mouth 3 (three) times daily., Disp: , Rfl:  .  hydrALAZINE (APRESOLINE) 100 MG tablet, TAKE 1 TABLET BY MOUTH 2 TIMES DAILY., Disp: 180 tablet, Rfl: 3 .  ipratropium-albuterol (DUONEB) 0.5-2.5 (3) MG/3ML SOLN, Inhale 1 mL into the lungs as needed., Disp: , Rfl:  .  isosorbide mononitrate (IMDUR) 30 MG 24 hr tablet, Take 1 tablet (30 mg total) by mouth daily., Disp: 30 tablet, Rfl: 5 .  montelukast (SINGULAIR) 10 MG tablet, Take 10 mg by mouth at bedtime., Disp: , Rfl:  .  nebivolol (BYSTOLIC) 10 MG tablet, Take 1 tablet (10 mg total) by mouth daily., Disp: 30 tablet, Rfl: 5 .  olmesartan-hydrochlorothiazide (BENICAR HCT) 40-25 MG tablet, Take 0.5 tablets by mouth daily., Disp: 45 tablet, Rfl: 3 .  polyethylene glycol (MIRALAX / GLYCOLAX) 17 g packet, Take 17 g by mouth daily., Disp: 30 each, Rfl: 2 .  rosuvastatin (CRESTOR) 40 MG tablet, Take 40 mg by mouth daily., Disp: , Rfl:  .  testosterone cypionate (DEPOTESTOSTERONE CYPIONATE) 200 MG/ML injection, Inject 100 mg into the muscle once a week., Disp: , Rfl:  .  TRULICITY 1.5 0000000 SOPN, Inject 1.5 mg into the skin every Saturday. , Disp: , Rfl:  .  azelastine (ASTELIN) 0.1 % nasal spray, Place 2 sprays into both nostrils 2 (two) times daily. Use in each nostril as directed, Disp: 30 mL, Rfl: Bellwood Willson Lipa, DO [facility] 10/15/2019 9:53 AM

## 2019-10-15 NOTE — Patient Instructions (Addendum)
Thank you for visiting Dr. Carlis Abbott at [facility]. We recommend the following: Orders Placed This Encounter  Procedures  . Pulmonary function test   Orders Placed This Encounter  Procedures  . Pulmonary function test    Standing Status:   Future    Standing Expiration Date:   10/14/2020    Order Specific Question:   Where should this test be performed?    Answer:   [facility]    Order Specific Question:   Full PFT: includes the following: basic spirometry, spirometry pre & post bronchodilator, diffusion capacity (DLCO), lung volumes    Answer:   Full PFT    Meds ordered this encounter  Medications  . azelastine (ASTELIN) 0.1 % nasal spray    Sig: Place 2 sprays into both nostrils 2 (two) times daily. Use in each nostril as directed    Dispense:  30 mL    Refill:  12    Return in about 3 months (around 01/15/2020).    Please do your part to reduce the spread of COVID-19.

## 2019-10-21 DIAGNOSIS — N401 Enlarged prostate with lower urinary tract symptoms: Secondary | ICD-10-CM | POA: Diagnosis not present

## 2019-10-21 DIAGNOSIS — E291 Testicular hypofunction: Secondary | ICD-10-CM | POA: Diagnosis not present

## 2019-10-21 DIAGNOSIS — N3941 Urge incontinence: Secondary | ICD-10-CM | POA: Diagnosis not present

## 2019-10-21 DIAGNOSIS — N5201 Erectile dysfunction due to arterial insufficiency: Secondary | ICD-10-CM | POA: Diagnosis not present

## 2019-11-10 NOTE — Progress Notes (Signed)
Cardiology Office Note   Date:  11/11/2019   ID:  Vincent Ramos, DOB 04/18/1951, MRN [id number]  PCP:  Sonia Side., FNP    No chief complaint on file.  Hypertension  Wt Readings from Last 3 Encounters:  11/11/19 266 lb 6.4 oz (120.8 kg)  10/15/19 266 lb 6.4 oz (120.8 kg)  05/09/19 270 lb 11.6 oz (122.8 kg)       History of Present Illness: Vincent Ramos is a 69 y.o. male who has seen Dr. Johnsie Cancel in the past for hypertension.  He had a cardiac cath in June 2019 showing no significant CAD.  He has had a prior renal duplex showing no renal artery stenosis.  Dr. Nolon Lennert as noted that he has had issues with "compliance and self adjusting medications."  He has reduced hydralazine dose to 100 mg twice daily due to headaches.  He has intermittently take an extra Bystolic in the past when his blood pressure was high.  He stopped his statins due to myalgias.  Dr. Johnsie Cancel reports that the patient did not want to have to take as much medicine.  He also reported that the patient had "lifestyle issues with poor diet and obesity."  At his visit in February 2020 with Dr. Johnsie Cancel, clonidine was stopped.  His Bystolic was changed to labetalol 200 mg p.o. twice daily.  Since the last visit, he has had some rare chest tightness. He has DOE.    Chest tightness may only come up 1-2x/month.  Not related to exertion.   He can feel his HR increasing due to walking fast, he will have some doe.    He has some hand numbness  In his hand and may need a neck operation, cervical fusion.        Past Medical History:  Diagnosis Date  . Abnormal nuclear cardiac imaging test 12/27/2017  . Acute on chronic diastolic CHF (congestive heart failure) ([place]) 09/17/2017  . Acute respiratory failure with hypoxia ([place]) 06/01/2017  . Arthritis   . Asthma   . CAD (coronary artery disease)    a. 11/2017: cath showing widely patent coronary arteries with normal left main, 30% mid LAD, luminal irregularities  in the proximal and mid circumflex, and 30 to 40% stenosis in the mid RCA.  Marland Kitchen CAP (community acquired pneumonia) 07/31/2014  . Community acquired pneumonia 07/31/2014  . Constipation 02/06/2018  . COPD exacerbation ([place]) 09/17/2017  . Dysphagia   . Elevated troponin 09/17/2017  . Enlarged prostate   . Grade II diastolic dysfunction   . Hypertension   . Lumbar stenosis 10/07/2015  . Obesity 07/31/2014  . PNA (pneumonia) 05/30/2017  . Pneumonia   . Pneumonia   . Polyp of rectum   . Respiratory failure ([place]) 09/18/2017  . SBO (small bowel obstruction) ([place]) 05/08/2019  . Shortness of breath   . Type 2 diabetes mellitus ([place])     Past Surgical History:  Procedure Laterality Date  . CIRCUMCISION    . COLONOSCOPY WITH PROPOFOL N/A 08/08/2017   Procedure: COLONOSCOPY WITH PROPOFOL;  Surgeon: Danie Binder, MD;  [facility];  Service: Endoscopy;  Laterality: N/A;  1:00pm  . ESOPHAGEAL DILATION N/A 06/07/2017   Procedure: ESOPHAGEAL DILATION;  Surgeon: Daneil Dolin, MD;  Location: [facility];  Service: Endoscopy;  Laterality: N/A;  . ESOPHAGOGASTRODUODENOSCOPY (EGD) WITH PROPOFOL N/A 06/07/2017   Procedure: ESOPHAGOGASTRODUODENOSCOPY (EGD) WITH PROPOFOL;  Surgeon: Daneil Dolin, MD;  [facility]: [facility];  Service: Endoscopy;  Laterality: N/A;  . HYDROCELE EXCISION  10/14/2011   Procedure: HYDROCELECTOMY ADULT;  Surgeon: Malka So, MD;  Location: [facility];  Service: Urology;  Laterality: Right;  . LACERATION REPAIR     left hand  . LUMBAR LAMINECTOMY/DECOMPRESSION MICRODISCECTOMY Right 10/07/2015   Procedure: Right Lumbar four-five ,lumbar five sacral-one Laminectomy;  Surgeon: Leeroy Cha, MD;  Location: [facility];  Service: Neurosurgery;  Laterality: Right;  . RIGHT/LEFT HEART CATH AND CORONARY ANGIOGRAPHY N/A 12/27/2017   Procedure: RIGHT/LEFT HEART CATH AND CORONARY ANGIOGRAPHY;  Surgeon: Belva Crome, MD;  Location: [facility];  Service: Cardiovascular;   Laterality: N/A;     Current Outpatient Medications  Medication Sig Dispense Refill  . albuterol (VENTOLIN HFA) 108 (90 Base) MCG/ACT inhaler Inhale 2 puffs into the lungs 2 (two) times daily as needed for wheezing or shortness of breath.    Marland Kitchen aspirin EC 81 MG tablet Take 1 tablet (81 mg total) by mouth daily with breakfast. 30 tablet 4  . azelastine (ASTELIN) 0.1 % nasal spray Place 2 sprays into both nostrils 2 (two) times daily. Use in each nostril as directed 30 mL 12  . budesonide-formoterol (SYMBICORT) 160-4.5 MCG/ACT inhaler Inhale 2 puffs into the lungs 2 (two) times daily.    Marland Kitchen gabapentin (NEURONTIN) 300 MG capsule Take 300 mg by mouth 3 (three) times daily.    . hydrALAZINE (APRESOLINE) 100 MG tablet TAKE 1 TABLET BY MOUTH 2 TIMES DAILY. 180 tablet 3  . ipratropium-albuterol (DUONEB) 0.5-2.5 (3) MG/3ML SOLN Inhale 1 mL into the lungs as needed.    . isosorbide mononitrate (IMDUR) 30 MG 24 hr tablet Take 1 tablet (30 mg total) by mouth daily. 30 tablet 5  . montelukast (SINGULAIR) 10 MG tablet Take 10 mg by mouth at bedtime.    . nebivolol (BYSTOLIC) 10 MG tablet Take 1 tablet (10 mg total) by mouth daily. 30 tablet 5  . olmesartan-hydrochlorothiazide (BENICAR HCT) 40-25 MG tablet Take 0.5 tablets by mouth daily. 45 tablet 3  . polyethylene glycol (MIRALAX / GLYCOLAX) 17 g packet Take 17 g by mouth daily. 30 each 2  . rosuvastatin (CRESTOR) 40 MG tablet Take 40 mg by mouth daily.    . tamsulosin (FLOMAX) 0.4 MG CAPS capsule Take 0.4 mg by mouth daily.    Marland Kitchen testosterone cypionate (DEPOTESTOSTERONE CYPIONATE) 200 MG/ML injection Inject 100 mg into the muscle once a week.    . TRULICITY 1.5 0000000 SOPN Inject 1.5 mg into the skin every Saturday.      No current facility-administered medications for this visit.    Allergies:   Amlodipine, Novolog [insulin aspart], and Victoza [liraglutide]    Social History:  The patient  reports that he has never smoked. He has never used  smokeless tobacco. He reports that he does not drink alcohol or use drugs.   Family History:  The patient's family history includes CAD in an other family member; Cancer in an other family member; Diabetes in his sister and another family member; Hypertension in his sister and another family member; Lung cancer in his mother.    ROS:  Please see the history of present illness.   Otherwise, review of systems are positive for hand pain.   All other systems are reviewed and negative.    PHYSICAL EXAM: VS:  BP (!) 160/84   Pulse 73   Ht 5' 6.5" (1.689 m)   Wt 266 lb 6.4 oz (120.8 kg)   SpO2  92%   BMI 42.35 kg/m  , BMI Body mass index is 42.35 kg/m. GEN: Well nourished, well developed, in no acute distress  HEENT: normal  Neck: no JVD, carotid bruits, or masses Cardiac: RRR; no murmurs, rubs, or gallops,no edema  Respiratory:  clear to auscultation bilaterally, normal work of breathing GI: soft, nontender, nondistended, + BS, obese MS: no deformity or atrophy  Skin: warm and dry, no rash Neuro:  Strength and sensation are intact Psych: euthymic mood, full affect   EKG:   The ekg ordered today demonstrates NSR, no ST changes   Recent Labs: 05/08/2019: ALT 19 05/10/2019: Magnesium 2.2 05/12/2019: BUN 7; Creatinine, Ser 1.10; Hemoglobin 13.7; Platelets 171; Potassium 3.5; Sodium 138   Lipid Panel    Component Value Date/Time   CHOL 193 01/04/2013 0804   TRIG 129 01/04/2013 0804   HDL 45 01/04/2013 0804   CHOLHDL 4.3 01/04/2013 0804   VLDL 26 01/04/2013 0804   LDLCALC 122 (H) 01/04/2013 0804     Other studies Reviewed: Additional studies/ records that were reviewed today with results demonstrating: labs/records from PMD reviewed   ASSESSMENT AND PLAN:  1. Hypertension: Low-sodium diet stressed.  Whole food, plant-based diet recommended.  Will add diltiazem 120 mg daily to see if this helps with BP and with palpitations.  May need to consider stronger diuretic given LE  swelling.  F/u in [facility] HTN clinic.  Swelling already present before diltiazem.  2. CAD: Mild by 2019 catheterization.  Angina well controlled.  3. Hyperlipidemia: Statin has been recommended but he stopped this due to perceived myalgias. Now tolerating rosuvastatin. 4. Chronic diastolic heart failure:  Mild fluid overload in legs.  Reduce salt intake.  COntinue HCTZ.  COnsider switch to furosemide if increased volume overload.   5. Diabetes: I stressed the importance of diet and weight loss. A1C 7.2 in 06/2019.  Weight loss  would help DM.   6. Preop eval: Sx under control.  Recent cath in 2019.  No further cardiac testing neded before surgery.  Avoid excess IV fluids given his mildly decreased LVEF.    Current medicines are reviewed at length with the patient today.  The patient concerns regarding his medicines were addressed.  The following changes have been made:  No change  Labs/ tests ordered today include:  No orders of the defined types were placed in this encounter.   Recommend 150 minutes/week of aerobic exercise Low fat, low carb, high fiber diet recommended  Disposition:   FU in 2 week HTN clinic   Signed, Larae Grooms, MD  11/11/2019 11:00 AM    [facility] [place], [place], [place]  [address] Phone: [phone]; Fax: [phone]

## 2019-11-11 ENCOUNTER — Other Ambulatory Visit: Payer: Self-pay

## 2019-11-11 ENCOUNTER — Encounter: Payer: Self-pay | Admitting: Interventional Cardiology

## 2019-11-11 ENCOUNTER — Ambulatory Visit: Payer: PPO | Admitting: Interventional Cardiology

## 2019-11-11 VITALS — BP 160/84 | HR 73 | Ht 66.5 in | Wt 266.4 lb

## 2019-11-11 DIAGNOSIS — I2510 Atherosclerotic heart disease of native coronary artery without angina pectoris: Secondary | ICD-10-CM | POA: Diagnosis not present

## 2019-11-11 DIAGNOSIS — I5032 Chronic diastolic (congestive) heart failure: Secondary | ICD-10-CM

## 2019-11-11 DIAGNOSIS — E785 Hyperlipidemia, unspecified: Secondary | ICD-10-CM

## 2019-11-11 DIAGNOSIS — I119 Hypertensive heart disease without heart failure: Secondary | ICD-10-CM

## 2019-11-11 MED ORDER — DILTIAZEM HCL ER COATED BEADS 120 MG PO CP24: 120.0000 mg | ORAL_CAPSULE | Freq: Every day | ORAL | 3 refills | Status: DC

## 2019-11-11 NOTE — Patient Instructions (Signed)
Medication Instructions:  Your physician has recommended you make the following change in your medication:   START: diltiazem 120 mg tablet: Take 1 tablet by mouth once a day  *If you need a refill on your cardiac medications before your next appointment, please call your pharmacy*   Lab Work: None ordered  If you have labs (blood work) drawn today and your tests are completely normal, you will receive your results only by: Marland Kitchen MyChart Message (if you have MyChart) OR . A paper copy in the mail If you have any lab test that is abnormal or we need to change your treatment, we will call you to review the results.   Testing/Procedures: None ordered   Follow-Up:  Follow up in the Hypertension Clinic on 11/25/19 at 1:30 PM  At [facility], you and your health needs are our priority.  As part of our continuing mission to provide you with exceptional heart care, we have created designated Provider Care Teams.  These Care Teams include your primary Cardiologist (physician) and Advanced Practice Providers (APPs -  Physician Assistants and Nurse Practitioners) who all work together to provide you with the care you need, when you need it.  We recommend signing up for the patient portal called "MyChart".  Sign up information is provided on this After Visit Summary.  MyChart is used to connect with patients for Virtual Visits (Telemedicine).  Patients are able to view lab/test results, encounter notes, upcoming appointments, etc.  Non-urgent messages can be sent to your provider as well.   To learn more about what you can do with MyChart, go to [email or web address].    Your next appointment:   12 month(s)  The format for your next appointment:   In Person  Provider:   You may see Larae Grooms, MD or one of the following Advanced Practice Providers on your designated Care Team:    Melina Copa, PA-C  Ermalinda Barrios, PA-C    Other Instructions

## 2019-11-25 ENCOUNTER — Other Ambulatory Visit: Payer: Self-pay

## 2019-11-25 ENCOUNTER — Ambulatory Visit (INDEPENDENT_AMBULATORY_CARE_PROVIDER_SITE_OTHER): Payer: PPO | Admitting: Pharmacist

## 2019-11-25 VITALS — BP 166/72 | HR 80

## 2019-11-25 DIAGNOSIS — I10 Essential (primary) hypertension: Secondary | ICD-10-CM | POA: Diagnosis not present

## 2019-11-25 MED ORDER — NEBIVOLOL HCL 20 MG PO TABS: ORAL_TABLET | ORAL | 3 refills | Status: DC

## 2019-11-25 MED ORDER — OLMESARTAN MEDOXOMIL-HCTZ 40-25 MG PO TABS: 1.0000 | ORAL_TABLET | Freq: Every day | ORAL | 3 refills | Status: DC

## 2019-11-25 MED ORDER — HYDRALAZINE HCL 100 MG PO TABS: 100.0000 mg | ORAL_TABLET | Freq: Three times a day (TID) | ORAL | 3 refills | Status: DC

## 2019-11-25 NOTE — Progress Notes (Signed)
Patient ID: Vincent Ramos                 DOB: 15-Jun-1951                      MRN: [id number]     HPI: Dayquan Marburger Kahan is a 69 y.o. male referred by Dr. Irish Lack to HTN clinic. PMH is significant for HTN, renal duplex negative for renal artery stenosis, diastolic CHF, nonobstructive CAD, and DM2. He was seen 11/11/19 by Dr Irish Lack and BP was elevated at 160/84. Pt was started on diltiazem to help with BP and palpitations.  Pt presents today in good spirits. Reports tolerating his medications well. He spaces them apart so he feels better when he takes them. Denies balance problems, headaches, vision changes. Occasional dizziness if he stands up too quickly. States he is taking a full tablet of his olmesartan-HCTZ - our med list shows 1/2 tablet daily. He's a bit stressed today - got a flat tire on the way to the office. Doesn't add salt frequently to his food. Does take ibuprofen 400mg  a few days a week for shoulder and neck pain. States Tylenol doesn't work too well.  Current HTN meds: hydralazine 100mg  BID, Imdur 30mg  daily, Bystolic 10mg  daily, olmesartan-HCTZ 40-25mg  daily, diltiazem 120mg  daily  Previously tried: hydralazine 100mg  TID - headaches  BP goal: <130/73mmHg  Family History: The patient's family history includes CAD in an other family member; Cancer in an other family member; Diabetes in his sister and another family member; Hypertension in his sister and another family member; Lung cancer in his mother.   Social History: The patient  reports that he has never smoked. He has never used smokeless tobacco. He reports that he does not drink alcohol or use drugs.  Diet: Breakfast - cornflakes, protein drink. Lunch - baked chicken. Dinner - fish, chicken. Snacks - donuts. Drinks - water, occasional soda, 2-3 cups of coffee a few days a week. Does not add salt to food.   Exercise: Walks sometimes - used to walk 2 miles a day before his lungs were giving him trouble.  Home BP  readings: Needs to purchase a new cuff.  Wt Readings from Last 3 Encounters:  11/11/19 266 lb 6.4 oz (120.8 kg)  10/15/19 266 lb 6.4 oz (120.8 kg)  05/09/19 270 lb 11.6 oz (122.8 kg)   BP Readings from Last 3 Encounters:  11/11/19 (!) 160/84  10/15/19 (!) 160/70  05/12/19 140/77   Pulse Readings from Last 3 Encounters:  11/11/19 73  10/15/19 74  05/12/19 79    Renal function: CrCl cannot be calculated (Patient's most recent lab result is older than the maximum 21 days allowed.).  Past Medical History:  Diagnosis Date  . Abnormal nuclear cardiac imaging test 12/27/2017  . Acute on chronic diastolic CHF (congestive heart failure) ([place]) 09/17/2017  . Acute respiratory failure with hypoxia ([place]) 06/01/2017  . Arthritis   . Asthma   . CAD (coronary artery disease)    a. 11/2017: cath showing widely patent coronary arteries with normal left main, 30% mid LAD, luminal irregularities in the proximal and mid circumflex, and 30 to 40% stenosis in the mid RCA.  Marland Kitchen CAP (community acquired pneumonia) 07/31/2014  . Community acquired pneumonia 07/31/2014  . Constipation 02/06/2018  . COPD exacerbation ([place]) 09/17/2017  . Dysphagia   . Elevated troponin 09/17/2017  . Enlarged prostate   . Grade II diastolic dysfunction   . Hypertension   .  Lumbar stenosis 10/07/2015  . Obesity 07/31/2014  . PNA (pneumonia) 05/30/2017  . Pneumonia   . Pneumonia   . Polyp of rectum   . Respiratory failure ([place]) 09/18/2017  . SBO (small bowel obstruction) ([place]) 05/08/2019  . Shortness of breath   . Type 2 diabetes mellitus ([place])     Current Outpatient Medications on File Prior to Visit  Medication Sig Dispense Refill  . albuterol (VENTOLIN HFA) 108 (90 Base) MCG/ACT inhaler Inhale 2 puffs into the lungs 2 (two) times daily as needed for wheezing or shortness of breath.    Marland Kitchen aspirin EC 81 MG tablet Take 1 tablet (81 mg total) by mouth daily with breakfast. 30 tablet 4  . azelastine (ASTELIN) 0.1 % nasal spray  Place 2 sprays into both nostrils 2 (two) times daily. Use in each nostril as directed 30 mL 12  . budesonide-formoterol (SYMBICORT) 160-4.5 MCG/ACT inhaler Inhale 2 puffs into the lungs 2 (two) times daily.    Marland Kitchen diltiazem (CARDIZEM CD) 120 MG 24 hr capsule Take 1 capsule (120 mg total) by mouth daily. 90 capsule 3  . gabapentin (NEURONTIN) 300 MG capsule Take 300 mg by mouth 3 (three) times daily.    . hydrALAZINE (APRESOLINE) 100 MG tablet TAKE 1 TABLET BY MOUTH 2 TIMES DAILY. 180 tablet 3  . ipratropium-albuterol (DUONEB) 0.5-2.5 (3) MG/3ML SOLN Inhale 1 mL into the lungs as needed.    . isosorbide mononitrate (IMDUR) 30 MG 24 hr tablet Take 1 tablet (30 mg total) by mouth daily. 30 tablet 5  . montelukast (SINGULAIR) 10 MG tablet Take 10 mg by mouth at bedtime.    . nebivolol (BYSTOLIC) 10 MG tablet Take 1 tablet (10 mg total) by mouth daily. 30 tablet 5  . olmesartan-hydrochlorothiazide (BENICAR HCT) 40-25 MG tablet Take 0.5 tablets by mouth daily. 45 tablet 3  . polyethylene glycol (MIRALAX / GLYCOLAX) 17 g packet Take 17 g by mouth daily. 30 each 2  . rosuvastatin (CRESTOR) 40 MG tablet Take 40 mg by mouth daily.    . tamsulosin (FLOMAX) 0.4 MG CAPS capsule Take 0.4 mg by mouth daily.    Marland Kitchen testosterone cypionate (DEPOTESTOSTERONE CYPIONATE) 200 MG/ML injection Inject 100 mg into the muscle once a week.    . TRULICITY 1.5 0000000 SOPN Inject 1.5 mg into the skin every Saturday.      No current facility-administered medications on file prior to visit.    Allergies  Allergen Reactions  . Amlodipine     Felt "jittery" constantly  . Novolog [Insulin Aspart] Hives, Itching and Rash  . Victoza [Liraglutide] Itching and Rash     Assessment/Plan:  1. Hypertension - BP remains elevated above goal <130/53mmHg. Will increase Bystolic from 10mg  to 20mg  daily and increase frequency of hydralazine 100mg  from BID to TID. Pt will decrease back to BID dosing if his headaches return on higher  frequency. Continue olmesartan-HCTZ 40-25mg  daily, Imdur 30mg  daily, and diltiazem 120mg  daily. Encouraged pt to increase walking, limit sodium to 000mg  daily, and try extra strength Tylenol instead of ibuprofen. Provided pt with DASH diet handout today. He plans to purchase a BP cuff, will monitor readings at home, and bring readings and cuff to his next visit. Will f/u in clinic in 4 weeks. Can consider starting spironolactone if additional agent is needed.  Denesia Donelan E. Nikai Quest, PharmD, BCACP, [place] [id number] [place], [place] [address] Phone: [phone]; Fax: [phone] 11/25/2019 2:52 PM

## 2019-11-25 NOTE — Patient Instructions (Addendum)
It was nice to meet you today  Increase your Bystolic to 20mg  a day. You can take 2 of your 10mg  tablets until you run out, then pick up your new prescription for the 20mg  dose and go back to taking 1 tablet a day  Start taking your hydralazine 3 times a day  Continue taking 1 tablet of your Imdur, diltiazem, and olmesartan--HCTZ each day for your blood pressure  Look for a new blood pressure cuff - your goal is < 130/19mmHg  Limit your salt to less than 2,000mg  daily  Try to stay active with walking  Follow up in clinic in 4 weeks for a blood pressure check. Please bring in your home cuff and readings to this visit

## 2019-12-09 DIAGNOSIS — H2513 Age-related nuclear cataract, bilateral: Secondary | ICD-10-CM | POA: Diagnosis not present

## 2019-12-09 DIAGNOSIS — H04123 Dry eye syndrome of bilateral lacrimal glands: Secondary | ICD-10-CM | POA: Diagnosis not present

## 2019-12-09 DIAGNOSIS — H1045 Other chronic allergic conjunctivitis: Secondary | ICD-10-CM | POA: Diagnosis not present

## 2019-12-09 DIAGNOSIS — H40023 Open angle with borderline findings, high risk, bilateral: Secondary | ICD-10-CM | POA: Diagnosis not present

## 2019-12-09 DIAGNOSIS — H5213 Myopia, bilateral: Secondary | ICD-10-CM | POA: Diagnosis not present

## 2019-12-09 DIAGNOSIS — H25013 Cortical age-related cataract, bilateral: Secondary | ICD-10-CM | POA: Diagnosis not present

## 2019-12-16 ENCOUNTER — Other Ambulatory Visit: Payer: Self-pay

## 2019-12-16 ENCOUNTER — Encounter (INDEPENDENT_AMBULATORY_CARE_PROVIDER_SITE_OTHER): Payer: PPO | Admitting: Ophthalmology

## 2019-12-16 DIAGNOSIS — H33301 Unspecified retinal break, right eye: Secondary | ICD-10-CM

## 2019-12-16 DIAGNOSIS — D3131 Benign neoplasm of right choroid: Secondary | ICD-10-CM | POA: Diagnosis not present

## 2019-12-16 DIAGNOSIS — H43813 Vitreous degeneration, bilateral: Secondary | ICD-10-CM | POA: Diagnosis not present

## 2019-12-16 DIAGNOSIS — E11311 Type 2 diabetes mellitus with unspecified diabetic retinopathy with macular edema: Secondary | ICD-10-CM

## 2019-12-16 DIAGNOSIS — I10 Essential (primary) hypertension: Secondary | ICD-10-CM

## 2019-12-16 DIAGNOSIS — H35033 Hypertensive retinopathy, bilateral: Secondary | ICD-10-CM | POA: Diagnosis not present

## 2019-12-16 DIAGNOSIS — E113312 Type 2 diabetes mellitus with moderate nonproliferative diabetic retinopathy with macular edema, left eye: Secondary | ICD-10-CM | POA: Diagnosis not present

## 2019-12-23 ENCOUNTER — Other Ambulatory Visit: Payer: Self-pay

## 2019-12-23 ENCOUNTER — Encounter (INDEPENDENT_AMBULATORY_CARE_PROVIDER_SITE_OTHER): Payer: PPO | Admitting: Ophthalmology

## 2019-12-23 ENCOUNTER — Ambulatory Visit (INDEPENDENT_AMBULATORY_CARE_PROVIDER_SITE_OTHER): Payer: PPO | Admitting: Pharmacist

## 2019-12-23 VITALS — BP 140/58 | HR 84

## 2019-12-23 DIAGNOSIS — I10 Essential (primary) hypertension: Secondary | ICD-10-CM

## 2019-12-23 DIAGNOSIS — H33301 Unspecified retinal break, right eye: Secondary | ICD-10-CM

## 2019-12-23 LAB — BASIC METABOLIC PANEL
BUN/Creatinine Ratio: 10 (ref 10–24)
BUN: 12 mg/dL (ref 8–27)
CO2: 23 mmol/L (ref 20–29)
Calcium: 8.9 mg/dL (ref 8.6–10.2)
Chloride: 103 mmol/L (ref 96–106)
Creatinine, Ser: 1.26 mg/dL (ref 0.76–1.27)
GFR calc Af Amer: 68 mL/min/{1.73_m2} (ref >59)
GFR calc non Af Amer: 59 mL/min/{1.73_m2} — ABNORMAL LOW (ref >59)
Glucose: 212 mg/dL — ABNORMAL HIGH (ref 65–99)
Potassium: 3.9 mmol/L (ref 3.5–5.2)
Sodium: 141 mmol/L (ref 134–144)

## 2019-12-23 NOTE — Progress Notes (Signed)
Patient ID: Vincent Ramos                 DOB: 08-08-1950                      MRN: [id number]     HPI: [name] is a 69 y.o. male referred by Dr. Irish Lack to HTN clinic. PMH is significant for HTN, renal duplex negative for renal artery stenosis, diastolic CHF, nonobstructive CAD, COPD and DM2. He was last seen on 11/25/19 in HTN clinic and BP was elevated at 166/72. Bystolic was increased from 10mg  to 20mg  daily and hydralazine was increased from 100mg  from BID to TID. He was instructed to decrease back if his headaches worsened.  Patient presents today for follow up. He states he did get a headache from increased hydralazine dose and has cut back to twice a day. He denies any dizziness or lightheadedness. States he had some chest tightness last week for a few day, but its since resolved. SOB due to COPD- not associated with chest tightness.  He is using tylenol instead of IBU. Doesn't add salt frequently to his food. Looks at the nutrition facts. Does not exercise much since his lungs have given out.  States his blood pressure wont ever be controlled. All his family members have high blood pressure. He bought a new blood pressure cuff bu forgot to bring it. Gives me some reasons from memory. 140-160's/70's. States HR is about 72-75. Initial blood pressure in clinic was 170/60 on repeat 140/58.  Current HTN meds: hydralazine 100mg  TID, Imdur 30mg  daily, Bystolic 20mg  daily, olmesartan-HCTZ 40-25mg  daily, diltiazem 120mg  daily  Previously tried: hydralazine 100mg  TID - headaches  BP goal: <130/59mmHg  Family History: The patient's family history includes CAD in an other family member; Cancer in an other family member; Diabetes in his sister and another family member; Hypertension in his sister and another family member; Lung cancer in his mother.   Social History: The patient  reports that he has never smoked. He has never used smokeless tobacco. He reports that he does not drink alcohol  or use drugs.  Diet: Breakfast - cornflakes, protein drink. Lunch - baked chicken. Dinner - fish, chicken. Snacks - donuts. Drinks - water, occasional soda, 2-3 cups of coffee a few days a week. Does not add salt to food.   Exercise: Walks sometimes - used to walk 2 miles a day before his lungs were giving him trouble.  Home BP readings: 140-160's/70-80, HR about 72-75   Wt Readings from Last 3 Encounters:  11/11/19 266 lb 6.4 oz (120.8 kg)  10/15/19 266 lb 6.4 oz (120.8 kg)  05/09/19 270 lb 11.6 oz (122.8 kg)   BP Readings from Last 3 Encounters:  11/25/19 (!) 166/72  11/11/19 (!) 160/84  10/15/19 (!) 160/70   Pulse Readings from Last 3 Encounters:  11/25/19 80  11/11/19 73  10/15/19 74    Renal function: CrCl cannot be calculated (Patient's most recent lab result is older than the maximum 21 days allowed.).  Past Medical History:  Diagnosis Date  . Abnormal nuclear cardiac imaging test 12/27/2017  . Acute on chronic diastolic CHF (congestive heart failure) ([place]) 09/17/2017  . Acute respiratory failure with hypoxia ([place]) 06/01/2017  . Arthritis   . Asthma   . CAD (coronary artery disease)    a. 11/2017: cath showing widely patent coronary arteries with normal left main, 30% mid LAD, luminal irregularities in the proximal and  mid circumflex, and 30 to 40% stenosis in the mid RCA.  Marland Kitchen CAP (community acquired pneumonia) 07/31/2014  . Community acquired pneumonia 07/31/2014  . Constipation 02/06/2018  . COPD exacerbation ([place]) 09/17/2017  . Dysphagia   . Elevated troponin 09/17/2017  . Enlarged prostate   . Grade II diastolic dysfunction   . Hypertension   . Lumbar stenosis 10/07/2015  . Obesity 07/31/2014  . PNA (pneumonia) 05/30/2017  . Pneumonia   . Pneumonia   . Polyp of rectum   . Respiratory failure ([place]) 09/18/2017  . SBO (small bowel obstruction) ([place]) 05/08/2019  . Shortness of breath   . Type 2 diabetes mellitus ([place])     Current Outpatient Medications on File  Prior to Visit  Medication Sig Dispense Refill  . albuterol (VENTOLIN HFA) 108 (90 Base) MCG/ACT inhaler Inhale 2 puffs into the lungs 2 (two) times daily as needed for wheezing or shortness of breath.    Marland Kitchen aspirin EC 81 MG tablet Take 1 tablet (81 mg total) by mouth daily with breakfast. 30 tablet 4  . azelastine (ASTELIN) 0.1 % nasal spray Place 2 sprays into both nostrils 2 (two) times daily. Use in each nostril as directed 30 mL 12  . budesonide-formoterol (SYMBICORT) 160-4.5 MCG/ACT inhaler Inhale 2 puffs into the lungs 2 (two) times daily.    Marland Kitchen diltiazem (CARDIZEM CD) 120 MG 24 hr capsule Take 1 capsule (120 mg total) by mouth daily. 90 capsule 3  . gabapentin (NEURONTIN) 300 MG capsule Take 300 mg by mouth 3 (three) times daily.    . hydrALAZINE (APRESOLINE) 100 MG tablet Take 1 tablet (100 mg total) by mouth 3 (three) times daily. 180 tablet 3  . ipratropium-albuterol (DUONEB) 0.5-2.5 (3) MG/3ML SOLN Inhale 1 mL into the lungs as needed.    . isosorbide mononitrate (IMDUR) 30 MG 24 hr tablet Take 1 tablet (30 mg total) by mouth daily. 30 tablet 5  . montelukast (SINGULAIR) 10 MG tablet Take 10 mg by mouth at bedtime.    . nebivolol 20 MG TABS Take 1 tablet by mouth daily 90 tablet 3  . olmesartan-hydrochlorothiazide (BENICAR HCT) 40-25 MG tablet Take 1 tablet by mouth daily. 90 tablet 3  . polyethylene glycol (MIRALAX / GLYCOLAX) 17 g packet Take 17 g by mouth daily. 30 each 2  . rosuvastatin (CRESTOR) 40 MG tablet Take 40 mg by mouth daily.    . tamsulosin (FLOMAX) 0.4 MG CAPS capsule Take 0.4 mg by mouth daily.    Marland Kitchen testosterone cypionate (DEPOTESTOSTERONE CYPIONATE) 200 MG/ML injection Inject 100 mg into the muscle once a week.    . TRULICITY 1.5 0000000 SOPN Inject 1.5 mg into the skin every Saturday.      No current facility-administered medications on file prior to visit.    Allergies  Allergen Reactions  . Amlodipine     Felt "jittery" constantly  . Novolog [Insulin Aspart]  Hives, Itching and Rash  . Victoza [Liraglutide] Itching and Rash     Assessment/Plan:  1. Hypertension - Blood pressure is above goal of <130/80. Cannot increase hydralazine due to headaches. Not sure what benefit would come from increasing diltiazem. Will check BMP to see is K and Scr are ok to add spironolactone. I will call pt tomorrow with results and to discuss changes and schedule follow up. He seemed resistant to adding more medications. I explained to him the long term damage to kidneys, heart, ect with prolonged high blood pressure. Explained that just because his  family has high blood pressure doesn't mean that we cant control him. Advised that it needs to start with lifestyle. Trying to lose some weight and increase exercise. Explained that if you can only walk for 5-10 before getting short of breath, walk for 5-10 min, stop and then repeat again several times.    Ramond Dial, Pharm.D, BCPS, CPP [facility]  [id number], [place], [place] [address]  Phone: [phone]; Fax: [phone]  12/23/2019 7:30 AM

## 2019-12-23 NOTE — Patient Instructions (Addendum)
It was a pleasure to meet you.  Please try to increase your exercise. If you can only walk for 5-10 before getting short of breath, walk for 5-10 min, stop and then repeat again several times.  Continue hydralazine 100mg  twice a day, Imdur 30mg  daily, Bystolic 20mg  daily, olmesartan-HCTZ 40-25mg  daily, diltiazem 120mg  daily  I will call you tomorrow with the results of your blood work and with the medication plan  Call us at [phone] with any questions or concerns.

## 2019-12-24 ENCOUNTER — Telehealth: Payer: Self-pay | Admitting: Pharmacist

## 2019-12-24 DIAGNOSIS — H25013 Cortical age-related cataract, bilateral: Secondary | ICD-10-CM | POA: Diagnosis not present

## 2019-12-24 DIAGNOSIS — H1045 Other chronic allergic conjunctivitis: Secondary | ICD-10-CM | POA: Diagnosis not present

## 2019-12-24 DIAGNOSIS — H40023 Open angle with borderline findings, high risk, bilateral: Secondary | ICD-10-CM | POA: Diagnosis not present

## 2019-12-24 DIAGNOSIS — H2513 Age-related nuclear cataract, bilateral: Secondary | ICD-10-CM | POA: Diagnosis not present

## 2019-12-24 DIAGNOSIS — H04123 Dry eye syndrome of bilateral lacrimal glands: Secondary | ICD-10-CM | POA: Diagnosis not present

## 2019-12-24 MED ORDER — SPIRONOLACTONE 25 MG PO TABS
12.5000 mg | ORAL_TABLET | Freq: Every day | ORAL | 3 refills | Status: DC
Start: 2019-12-24 — End: 2020-01-14

## 2019-12-24 NOTE — Telephone Encounter (Signed)
Transferred call to Melissa  

## 2019-12-24 NOTE — Telephone Encounter (Signed)
BMP stable. Will start spironolactone 12.5mg  daily. Spoke with patient who is in agreement. Rx sent to pharmacy. Patient needed to get his calendar to schedule follow up. States he will call me back

## 2020-01-12 NOTE — Progress Notes (Signed)
Patient ID: Devontay Celaya Roher                 DOB: [birth year]                      MRN: [id number]     HPI: Zailyn Rowser Groleau is a 69 y.o. male referred by Dr. Irish Lack to HTN clinic. PMH is significant for HTN, renal duplex negative for renal artery stenosis, diastolic CHF, nonobstructive CAD, COPD and DM2. He was last seen on 12/23/19 in HTN clinic and BP remained slightly elevated at 140/58.  At previous visit on 5/24/214, he was initiated on spironolactone 12.5 mg daily. He reports that he has been taking either half or whole tablet (25 mg) of spironolactone depending on if he is able to split it or not. Previously he was titrated up on hydralazine to 100 mg TID, however he developed headaches and instructed to take only twice a day. He was started on diltiazem on visit with HTN clinic on 11/11/19 for BP and palpitations. He denied any dizziness or lightheadedness. He explained that he does not exercise much since his lungs have given out.   Today Mr. Donaway presents to HTN clinic in good spirits. He reports this his home blood pressure readings are in 140/80s, but he uses a wrist blood pressure monitor. He thought he was supposed to take Bystolic 10 mg BID, however his prescription appears to note that he was supposed to take Bystolic 20 mg daily. Patient did not bring in a medication list, therefore unable to verify all the medication and doses that he was taking but her reports good adherence. He denies any side effects from medication, except for very mild occasional muscle cramps. He has been doing better with diet, stating that he has reduced coffee from 3 cups to 1-2 cups of coffee in morning and mostly eating peanut butter sandwiches, fish, shrimp, chicken, and tacos. Exercise still remains poor but he has an exercise step-climber that he has started to use.  Current HTN meds: hydralazine 100mg  BID, Imdur 30mg  daily, Bystolic 10 mg BID (was prescribed 20 mg daily), olmesartan-HCTZ 40-25mg  daily,  diltiazem 120mg  daily, spironolactone 12.5 mg daily  Previously tried: hydralazine 100mg  TID - headaches  BP goal: <130/44mmHg  Family History: The patient's family history includes CAD in an other family member; Cancer in an other family member; Diabetes in his sister and another family member; Hypertension in his sister and another family member; Lung cancer in his mother.   Social History: The patient reports that he has never smoked. He has never used smokeless tobacco. He reports that he does not drink alcohol or use drugs.  Diet: Breakfast - peanut butter sandwich. Lunch - baked chicken. Dinner - fish, shrimp, chicken, tacos. Drinks - water, rarely drinks soda, 1-2 cups of coffee a few days a week. Does not add salt to food.   Exercise: Walks sometimes mostly in parking lot to and from vehicle- used to walk 2 miles a day before his lungs were giving him trouble.  Home BP readings: 140/80s, HR about 72-75   Wt Readings from Last 3 Encounters:  11/11/19 266 lb 6.4 oz (120.8 kg)  10/15/19 266 lb 6.4 oz (120.8 kg)  05/09/19 270 lb 11.6 oz (122.8 kg)   BP Readings from Last 3 Encounters:  01/13/20 134/70  12/23/19 (!) 140/58  11/25/19 (!) 166/72   Pulse Readings from Last 3 Encounters:  01/13/20 75  12/23/19 84  11/25/19 80    Renal function: CrCl cannot be calculated (Patient's most recent lab result is older than the maximum 21 days allowed.).  Past Medical History:  Diagnosis Date  . Abnormal nuclear cardiac imaging test 12/27/2017  . Acute on chronic diastolic CHF (congestive heart failure) ([place]) 09/17/2017  . Acute respiratory failure with hypoxia ([place]) 06/01/2017  . Arthritis   . Asthma   . CAD (coronary artery disease)    a. 11/2017: cath showing widely patent coronary arteries with normal left main, 30% mid LAD, luminal irregularities in the proximal and mid circumflex, and 30 to 40% stenosis in the mid RCA.  Marland Kitchen CAP (community acquired pneumonia) 07/31/2014  .  Community acquired pneumonia 07/31/2014  . Constipation 02/06/2018  . COPD exacerbation ([place]) 09/17/2017  . Dysphagia   . Elevated troponin 09/17/2017  . Enlarged prostate   . Grade II diastolic dysfunction   . Hypertension   . Lumbar stenosis 10/07/2015  . Obesity 07/31/2014  . PNA (pneumonia) 05/30/2017  . Pneumonia   . Pneumonia   . Polyp of rectum   . Respiratory failure ([place]) 09/18/2017  . SBO (small bowel obstruction) ([place]) 05/08/2019  . Shortness of breath   . Type 2 diabetes mellitus ([place])     Current Outpatient Medications on File Prior to Visit  Medication Sig Dispense Refill  . albuterol (VENTOLIN HFA) 108 (90 Base) MCG/ACT inhaler Inhale 2 puffs into the lungs 2 (two) times daily as needed for wheezing or shortness of breath.    Marland Kitchen aspirin EC 81 MG tablet Take 1 tablet (81 mg total) by mouth daily with breakfast. 30 tablet 4  . azelastine (ASTELIN) 0.1 % nasal spray Place 2 sprays into both nostrils 2 (two) times daily. Use in each nostril as directed (Patient taking differently: Place 2 sprays into both nostrils 2 (two) times daily as needed. Use in each nostril as directed) 30 mL 12  . azelastine (OPTIVAR) 0.05 % ophthalmic solution Apply 1 drop to eye 2 (two) times daily.    . budesonide-formoterol (SYMBICORT) 160-4.5 MCG/ACT inhaler Inhale 2 puffs into the lungs 2 (two) times daily.    Marland Kitchen diltiazem (CARDIZEM CD) 120 MG 24 hr capsule Take 1 capsule (120 mg total) by mouth daily. 90 capsule 3  . gabapentin (NEURONTIN) 300 MG capsule Take 100 mg by mouth 3 (three) times daily.     . hydrALAZINE (APRESOLINE) 100 MG tablet Take 1 tablet (100 mg total) by mouth 2 (two) times daily. 180 tablet 3  . ipratropium-albuterol (DUONEB) 0.5-2.5 (3) MG/3ML SOLN Inhale 1 mL into the lungs as needed.    . isosorbide mononitrate (IMDUR) 30 MG 24 hr tablet Take 1 tablet (30 mg total) by mouth daily. 30 tablet 5  . montelukast (SINGULAIR) 10 MG tablet Take 10 mg by mouth at bedtime.    . nebivolol  20 MG TABS Take 1 tablet by mouth daily 90 tablet 3  . olmesartan-hydrochlorothiazide (BENICAR HCT) 40-25 MG tablet Take 1 tablet by mouth daily. 90 tablet 3  . polyethylene glycol (MIRALAX / GLYCOLAX) 17 g packet Take 17 g by mouth daily. 30 each 2  . RESTASIS 0.05 % ophthalmic emulsion 1 drop 2 (two) times daily.    . rosuvastatin (CRESTOR) 40 MG tablet Take 40 mg by mouth daily.    Marland Kitchen spironolactone (ALDACTONE) 25 MG tablet Take 0.5 tablets (12.5 mg total) by mouth daily. 45 tablet 3  . tamsulosin (FLOMAX) 0.4 MG CAPS capsule Take 0.4 mg by mouth  daily.    . testosterone cypionate (DEPOTESTOSTERONE CYPIONATE) 200 MG/ML injection Inject 100 mg into the muscle once a week.    . TRULICITY 1.5 LK/4.4WN SOPN Inject 1.5 mg into the skin every Saturday.      No current facility-administered medications on file prior to visit.    Allergies  Allergen Reactions  . Amlodipine     Felt "jittery" constantly  . Novolog [Insulin Aspart] Hives, Itching and Rash  . Victoza [Liraglutide] Itching and Rash     Assessment/Plan:  1. Hypertension - Blood pressure in clinic today of 134/70 is slightly above goal of <130/80. Patient is tolerating spironolactone well and takes 0.5-1 tablet (12.5-25 mg) depending on if he is able to split the tablet. Will check BMP to monitor K and Scr on spironolactone. We will call pt tomorrow with results and increase spironolactone to 1 tablet (25 mg) daily if labs are stable. I instructed Mr. Gullion to start taking Bystolic 20 mg (2 tablets) daily as prescribed rather than 10 mg BID. Mr. Schicker' dose was doubled but he did not realize that he needed to take 2 tablets once a day rather than 1 tablet BID. We spoke at length about continuing to improve lifestyle modifications. He is doing better with diet and was encouraged to continue to work towards eliminating caffeine intake, reducing sugary snacks, and avoiding fast food. We discussed the benefits of weight loss and methods to  increase exercise. He is willing to rejoin the [facility] and increase activity on step-climber machine at home. We also discussed the importance of adherence to medications and having an updated medication list. Patient understood and will utilize a pill-box going forward. He will also attempt to bring in pill bottles next time so we can reconcile his medication list. Will recheck BMP in 1-2 weeks if spironolactone is increased (lab appointment already made).  2.) Myalgia - patient reported mild intermittent muscle aches/pain that are not affecting quality of life. Discussed that muscle cramps could be from rosuvastatin and to monitor if they get any worse. Patient instructed to call if muscle pain becomes significant.   Sherren Kerns, PharmD PGY1 Acute Care Pharmacy Resident

## 2020-01-13 ENCOUNTER — Other Ambulatory Visit: Payer: Self-pay

## 2020-01-13 ENCOUNTER — Ambulatory Visit (INDEPENDENT_AMBULATORY_CARE_PROVIDER_SITE_OTHER): Payer: PPO | Admitting: Pharmacist

## 2020-01-13 ENCOUNTER — Other Ambulatory Visit (HOSPITAL_COMMUNITY)
Admission: RE | Admit: 2020-01-13 | Discharge: 2020-01-13 | Disposition: A | Discharge status: Home / Self Care | Payer: PPO | Source: Ambulatory Visit | Attending: Critical Care Medicine | Admitting: Critical Care Medicine

## 2020-01-13 VITALS — BP 134/70 | HR 75

## 2020-01-13 DIAGNOSIS — I10 Essential (primary) hypertension: Secondary | ICD-10-CM | POA: Diagnosis not present

## 2020-01-13 DIAGNOSIS — Z20822 Contact with and (suspected) exposure to covid-19: Secondary | ICD-10-CM | POA: Diagnosis not present

## 2020-01-13 DIAGNOSIS — Z01812 Encounter for preprocedural laboratory examination: Secondary | ICD-10-CM | POA: Insufficient documentation

## 2020-01-13 LAB — BASIC METABOLIC PANEL
BUN/Creatinine Ratio: 9 — ABNORMAL LOW (ref 10–24)
BUN: 10 mg/dL (ref 8–27)
CO2: 26 mmol/L (ref 20–29)
Calcium: 8.9 mg/dL (ref 8.6–10.2)
Chloride: 99 mmol/L (ref 96–106)
Creatinine, Ser: 1.17 mg/dL (ref 0.76–1.27)
GFR calc Af Amer: 75 mL/min/{1.73_m2} (ref >59)
GFR calc non Af Amer: 65 mL/min/{1.73_m2} (ref >59)
Glucose: 202 mg/dL — ABNORMAL HIGH (ref 65–99)
Potassium: 4.2 mmol/L (ref 3.5–5.2)
Sodium: 138 mmol/L (ref 134–144)

## 2020-01-13 LAB — SARS CORONAVIRUS 2 (TAT 6-24 HRS): SARS Coronavirus 2: NEGATIVE

## 2020-01-13 NOTE — Patient Instructions (Signed)
It was a pleasure seeing you today!  MEDICATIONS: - If your blood work is stable, we will increase your spironolactone from 0.5 tablet to 1 tablet (25 mg) daily. - If your blood work needs attention, we will call you. Do not increase your spironolactone until told to. -Call if you have questions about your medications.  LABS: -We will call you if your labs need attention. -We will have you obtain repeat labs in 1-2 weeks  NEXT APPOINTMENT: Return to clinic in 1 month

## 2020-01-14 ENCOUNTER — Telehealth: Payer: Self-pay | Admitting: Pharmacist

## 2020-01-14 MED ORDER — SPIRONOLACTONE 25 MG PO TABS
25.0000 mg | ORAL_TABLET | Freq: Every day | ORAL | 3 refills | Status: DC
Start: 2020-01-14 — End: 2020-02-14

## 2020-01-14 NOTE — Telephone Encounter (Signed)
BMP is stable. Will increase spironolactone to 25mg  daily as planned. Spoke with patient who is in agreement. Requested his follow up lab be moved because he is having a eye procedure on the 24th. Lab moved to the 29th.

## 2020-01-16 ENCOUNTER — Other Ambulatory Visit: Payer: Self-pay

## 2020-01-16 ENCOUNTER — Encounter: Payer: Self-pay | Admitting: Critical Care Medicine

## 2020-01-16 ENCOUNTER — Ambulatory Visit (INDEPENDENT_AMBULATORY_CARE_PROVIDER_SITE_OTHER): Payer: PPO | Admitting: Critical Care Medicine

## 2020-01-16 DIAGNOSIS — J4530 Mild persistent asthma, uncomplicated: Secondary | ICD-10-CM | POA: Diagnosis not present

## 2020-01-16 DIAGNOSIS — J309 Allergic rhinitis, unspecified: Secondary | ICD-10-CM

## 2020-01-16 LAB — PULMONARY FUNCTION TEST
DL/VA % pred: 144 %
DL/VA: 6.1 ml/min/mmHg/L
DLCO cor % pred: 114 %
DLCO cor: 26.07 ml/min/mmHg
DLCO unc % pred: 114 %
DLCO unc: 26.07 ml/min/mmHg
FEF 25-75 Post: 1.62 L/sec
FEF 25-75 Pre: 0.98 L/sec
FEF2575-%Change-Post: 65 %
FEF2575-%Pred-Post: 73 %
FEF2575-%Pred-Pre: 44 %
FEV1-%Change-Post: 10 %
FEV1-%Pred-Post: 63 %
FEV1-%Pred-Pre: 57 %
FEV1-Post: 1.53 L
FEV1-Pre: 1.39 L
FEV1FVC-%Change-Post: 9 %
FEV1FVC-%Pred-Pre: 96 %
FEV6-%Change-Post: 2 %
FEV6-%Pred-Post: 62 %
FEV6-%Pred-Pre: 60 %
FEV6-Post: 1.89 L
FEV6-Pre: 1.84 L
FEV6FVC-%Change-Post: 1 %
FEV6FVC-%Pred-Post: 105 %
FEV6FVC-%Pred-Pre: 103 %
FVC-%Change-Post: 1 %
FVC-%Pred-Post: 59 %
FVC-%Pred-Pre: 58 %
FVC-Post: 1.89 L
FVC-Pre: 1.87 L
Post FEV1/FVC ratio: 81 %
Post FEV6/FVC ratio: 100 %
Pre FEV1/FVC ratio: 74 %
Pre FEV6/FVC Ratio: 98 %
RV % pred: 97 %
RV: 2.05 L
TLC % pred: 65 %
TLC: 3.91 L

## 2020-01-16 NOTE — Patient Instructions (Addendum)
Thank you for visiting Dr. Carlis Abbott at [facility]. We recommend the following: Keep taking Symbicort 2 times daily.  Continue singulair once daily Continue azelastine nasal spray 2 times daily.     Return in about 6 months (around 07/17/2020).    Please do your part to reduce the spread of COVID-19.

## 2020-01-16 NOTE — Progress Notes (Signed)
Full PFT performed today. °

## 2020-01-16 NOTE — Progress Notes (Signed)
Synopsis: Referred in March 2021 for COPD by Sonia Side., FNP  Subjective:   PATIENT ID: Vincent Ramos GENDER: male DOB: Sep 13, 1950, MRN: [id number]  Chief Complaint  Patient presents with  . Follow-up    Patient has been doing good since last visit. Using nasal spray and has helped with drainage. Denies cough    Virtual Visit via Video Note  I connected with Vincent Ramos on 01/16/20 at  1:30 PM EDT by a video enabled telemedicine application and verified that I am speaking with the correct person using two identifiers.  Location: Patient: Home Provider: [facility], [address] [place], [place]   I discussed the limitations of evaluation and management by telemedicine and the availability of in person appointments. The patient expressed understanding and agreed to proceed.     I discussed the assessment and treatment plan with the patient. The patient was provided an opportunity to ask questions and all were answered. The patient agreed with the plan and demonstrated an understanding of the instructions.   The patient was advised to call back or seek an in-person evaluation if the symptoms worsen or if the condition fails to improve as anticipated.  I provided 10 minutes of non-face-to-face time during this encounter.   Julian Hy, DO   Vincent Ramos is presenting for follow-up after his PFTs for chronic asthma.  He continues to use Symbicort twice daily, Singulair, and azelastine nasal spray twice daily.  He has had less coughing.  He uses his albuterol less frequently, sometimes going for 5 days without needing it.  He denies coughing, sputum, wheezing, postnasal drip.  He still gets her breath with activity.  He has been working on losing weight, so far losing 15 to 20 pounds.  He is trying to be as active as he can.    OV 10/14/19: Vincent Ramos is a 69 y/o gentleman with a history of HFpEF, COPD, obesity who presents for evaluation.  He was  previously patient of Dr. Luan Pulling who has recently retired.  He has minimal past smoking history, 1 to 2 cigarettes occasionally when he was younger.  He never was an everyday smoker.  He takes Symbicort 80/4.5 twice daily and uses his rescue albuterol less than once per week.  He has allergic sinus symptoms with postnasal drip that tend to worsen his symptoms and they are uncontrolled.  He intermittently uses intranasal steroids to help control his symptoms.  When his allergies are worse he has occasional cough with yellow sputum.  He sometimes has worsening breathing symptoms for no obvious reason and questions if this could be related to things he eats.  He has no significant change in symptoms with changes in weather.  He has chronic dyspnea on exertion, but most days does not have to stop activity as long as he is moving slowly.  Some days his symptoms are worse, but improved when he uses albuterol.  He is not taking antihistamines for his allergies.  No history of GERD; not currently taking a PPI.  No symptoms of heartburn.  Family history of lung disease includes his mother with lung cancer, who was a smoker.  He has an upcoming spine surgery with Dr. Duffy Rhody from [facility].  He has cervical stenosis causing weakness in his right hand and pain radiating down his arm.    Past Medical History:  Diagnosis Date  . Abnormal nuclear cardiac imaging test 12/27/2017  . Acute on  chronic diastolic CHF (congestive heart failure) ([place]) 09/17/2017  . Acute respiratory failure with hypoxia ([place]) 06/01/2017  . Arthritis   . Asthma   . CAD (coronary artery disease)    a. 11/2017: cath showing widely patent coronary arteries with normal left main, 30% mid LAD, luminal irregularities in the proximal and mid circumflex, and 30 to 40% stenosis in the mid RCA.  Marland Kitchen CAP (community acquired pneumonia) 07/31/2014  . Community acquired pneumonia 07/31/2014  . Constipation 02/06/2018  . COPD  exacerbation ([place]) 09/17/2017  . Dysphagia   . Elevated troponin 09/17/2017  . Enlarged prostate   . Grade II diastolic dysfunction   . Hypertension   . Lumbar stenosis 10/07/2015  . Obesity 07/31/2014  . PNA (pneumonia) 05/30/2017  . Pneumonia   . Pneumonia   . Polyp of rectum   . Respiratory failure ([place]) 09/18/2017  . SBO (small bowel obstruction) ([place]) 05/08/2019  . Shortness of breath   . Type 2 diabetes mellitus (HCC)      Family History  Problem Relation Age of Onset  . Diabetes Other   . Hypertension Other   . CAD Other   . Cancer Other   . Lung cancer Mother        smoker  . Diabetes Sister   . Hypertension Sister   . Anesthesia problems Neg Hx   . Hypotension Neg Hx   . Malignant hyperthermia Neg Hx   . Pseudochol deficiency Neg Hx   . Colon cancer Neg Hx   . Colon polyps Neg Hx      Past Surgical History:  Procedure Laterality Date  . CIRCUMCISION    . COLONOSCOPY WITH PROPOFOL N/A 08/08/2017   Procedure: COLONOSCOPY WITH PROPOFOL;  Surgeon: Danie Binder, MD;  [facility];  Service: Endoscopy;  Laterality: N/A;  1:00pm  . ESOPHAGEAL DILATION N/A 06/07/2017   Procedure: ESOPHAGEAL DILATION;  Surgeon: Daneil Dolin, MD;  [facility];  Service: Endoscopy;  Laterality: N/A;  . ESOPHAGOGASTRODUODENOSCOPY (EGD) WITH PROPOFOL N/A 06/07/2017   Procedure: ESOPHAGOGASTRODUODENOSCOPY (EGD) WITH PROPOFOL;  Surgeon: Daneil Dolin, MD;  [facility];  Service: Endoscopy;  Laterality: N/A;  . HYDROCELE EXCISION  10/14/2011   Procedure: HYDROCELECTOMY ADULT;  Surgeon: Malka So, MD;  Location: [facility];  Service: Urology;  Laterality: Right;  . LACERATION REPAIR     left hand  . LUMBAR LAMINECTOMY/DECOMPRESSION MICRODISCECTOMY Right 10/07/2015   Procedure: Right Lumbar four-five ,lumbar five sacral-one Laminectomy;  Surgeon: Leeroy Cha, MD;  Location: [facility];  Service: Neurosurgery;  Laterality: Right;  . RIGHT/LEFT HEART CATH AND  CORONARY ANGIOGRAPHY N/A 12/27/2017   Procedure: RIGHT/LEFT HEART CATH AND CORONARY ANGIOGRAPHY;  Surgeon: Belva Crome, MD;  Location: [facility];  Service: Cardiovascular;  Laterality: N/A;    Social History   Socioeconomic History  . Marital status: Married    Spouse name: Not on file  . Number of children: Not on file  . Years of education: Not on file  . Highest education level: Not on file  Occupational History  . Not on file  Tobacco Use  . Smoking status: Never Smoker  . Smokeless tobacco: Never Used  Vaping Use  . Vaping Use: Never used  Substance and Sexual Activity  . Alcohol use: No    Alcohol/week: 0.0 standard drinks    Comment: rare beer  . Drug use: No  . Sexual activity: Yes    Birth control/protection: None  Other Topics Concern  . Not on file  Social History Narrative  . Not on file   Social Determinants of Health   Financial Resource Strain:   . Difficulty of Paying Living Expenses:   Food Insecurity:   . Worried About Charity fundraiser in the Last Year:   . Arboriculturist in the Last Year:   Transportation Needs:   . Film/video editor (Medical):   Marland Kitchen Lack of Transportation (Non-Medical):   Physical Activity:   . Days of Exercise per Week:   . Minutes of Exercise per Session:   Stress:   . Feeling of Stress :   Social Connections:   . Frequency of Communication with Friends and Family:   . Frequency of Social Gatherings with Friends and Family:   . Attends Religious Services:   . Active Member of Clubs or Organizations:   . Attends Archivist Meetings:   Marland Kitchen Marital Status:   Intimate Partner Violence:   . Fear of Current or Ex-Partner:   . Emotionally Abused:   Marland Kitchen Physically Abused:   . Sexually Abused:      Allergies  Allergen Reactions  . Amlodipine     Felt "jittery" constantly  . Novolog [Insulin Aspart] Hives, Itching and Rash  . Victoza [Liraglutide] Itching and Rash     Immunization History    Administered Date(s) Administered  . Influenza,inj,Quad PF,6+ Mos 08/01/2014, 05/31/2017  . PFIZER SARS-COV-2 Vaccination 08/27/2019, 09/17/2019  . Pneumococcal Polysaccharide-23 08/01/2014    Outpatient Medications Prior to Visit  Medication Sig Dispense Refill  . albuterol (VENTOLIN HFA) 108 (90 Base) MCG/ACT inhaler Inhale 2 puffs into the lungs 2 (two) times daily as needed for wheezing or shortness of breath.    Marland Kitchen aspirin EC 81 MG tablet Take 1 tablet (81 mg total) by mouth daily with breakfast. 30 tablet 4  . azelastine (ASTELIN) 0.1 % nasal spray Place 2 sprays into both nostrils 2 (two) times daily. Use in each nostril as directed (Patient taking differently: Place 2 sprays into both nostrils 2 (two) times daily as needed. Use in each nostril as directed) 30 mL 12  . azelastine (OPTIVAR) 0.05 % ophthalmic solution Apply 1 drop to eye 2 (two) times daily.    . budesonide-formoterol (SYMBICORT) 160-4.5 MCG/ACT inhaler Inhale 2 puffs into the lungs 2 (two) times daily.    Marland Kitchen diltiazem (CARDIZEM CD) 120 MG 24 hr capsule Take 1 capsule (120 mg total) by mouth daily. 90 capsule 3  . gabapentin (NEURONTIN) 300 MG capsule Take 100 mg by mouth 3 (three) times daily.     . hydrALAZINE (APRESOLINE) 100 MG tablet Take 1 tablet (100 mg total) by mouth 2 (two) times daily. 180 tablet 3  . ipratropium-albuterol (DUONEB) 0.5-2.5 (3) MG/3ML SOLN Inhale 1 mL into the lungs as needed.    . isosorbide mononitrate (IMDUR) 30 MG 24 hr tablet Take 1 tablet (30 mg total) by mouth daily. 30 tablet 5  . montelukast (SINGULAIR) 10 MG tablet Take 10 mg by mouth at bedtime.    . nebivolol 20 MG TABS Take 1 tablet by mouth daily 90 tablet 3  . olmesartan-hydrochlorothiazide (BENICAR HCT) 40-25 MG tablet Take 1 tablet by mouth daily. 90 tablet 3  . polyethylene glycol (MIRALAX / GLYCOLAX) 17 g packet Take 17 g by mouth daily. 30 each 2  . RESTASIS 0.05 % ophthalmic emulsion 1 drop 2 (two) times daily.    .  rosuvastatin (CRESTOR) 40  MG tablet Take 40 mg by mouth daily.    Marland Kitchen spironolactone (ALDACTONE) 25 MG tablet Take 1 tablet (25 mg total) by mouth daily. 90 tablet 3  . tamsulosin (FLOMAX) 0.4 MG CAPS capsule Take 0.4 mg by mouth daily.    Marland Kitchen testosterone cypionate (DEPOTESTOSTERONE CYPIONATE) 200 MG/ML injection Inject 100 mg into the muscle once a week.    . TRULICITY 1.5 SW/5.4OE SOPN Inject 1.5 mg into the skin every Saturday.      No facility-administered medications prior to visit.    Review of Systems  Constitutional: Negative for chills and fever.       20# weight loss, planned  HENT: Positive for congestion.   Respiratory: Positive for cough, sputum production and shortness of breath. Negative for wheezing.   Cardiovascular: Negative for chest pain.       Chronic LE edema  Gastrointestinal: Negative for heartburn.  Neurological: Positive for tingling and focal weakness.  Endo/Heme/Allergies: Positive for environmental allergies.     Objective:   There were no vitals filed for this visit.   on   RA BMI Readings from Last 3 Encounters:  11/11/19 42.35 kg/m  10/15/19 42.35 kg/m  05/09/19 43.04 kg/m   Wt Readings from Last 3 Encounters:  11/11/19 266 lb 6.4 oz (120.8 kg)  10/15/19 266 lb 6.4 oz (120.8 kg)  05/09/19 270 lb 11.6 oz (122.8 kg)    Physical Exam-limited due to televisit.  No conversational dyspnea.  No observed coughing.  Answering questions appropriately.   CBC    Component Value Date/Time   WBC 6.5 05/12/2019 0621   RBC 5.39 05/12/2019 0621   HGB 13.7 05/12/2019 0621   HCT 44.1 05/12/2019 0621   PLT 171 05/12/2019 0621   MCV 81.8 05/12/2019 0621   MCH 25.4 (L) 05/12/2019 0621   MCHC 31.1 05/12/2019 0621   RDW 16.3 (H) 05/12/2019 0621   LYMPHSABS 0.5 (L) 05/12/2019 0621   MONOABS 0.9 05/12/2019 0621   EOSABS 0.2 05/12/2019 0621   BASOSABS 0.0 05/12/2019 0621    CHEMISTRY Recent Labs  Lab 01/13/20 1126  NA 138  K 4.2  CL 99  CO2 26   GLUCOSE 202*  BUN 10  CREATININE 1.17  CALCIUM 8.9   CrCl cannot be calculated (Unknown ideal weight.).   Chest Imaging- films reviewed: CXR, 2 view 02/03/2018-significantly elevated right hemidiaphragm, silhouetting of cardiac apex potentially due to apical fat pad.   CT chest with contrast 06/02/2017-bilateral groundglass opacities in a bronchovascular distribution, most notable in the upper lobes.  Elevated right hemidiaphragm.  Patulous esophagus.  Mild mediastinal and hilar adenopathy.    Pulmonary Functions Testing Results: PFT Results Latest Ref Rng & Units 01/16/2020  FVC-Pre L 1.87  FVC-Predicted Pre % 58  FVC-Post L 1.89  FVC-Predicted Post % 59  Pre FEV1/FVC % % 74  Post FEV1/FCV % % 81  FEV1-Pre L 1.39  FEV1-Predicted Pre % 57  FEV1-Post L 1.53  DLCO UNC% % 114  DLCO COR %Predicted % 144  TLC L 3.91  TLC % Predicted % 65  RV % Predicted % 97   2021-no significant obstruction or bronchodilator reversibility.  Mild restriction with preserved RV and ERV.  Normal diffusion.   Echocardiogram 09/18/2017: LVEF 60 to 65%, severe LVH.  Mildly dilated LV.  Normal LA, RV.  Thickened mitral valve leaflets with normal function, thickened aortic valve leaflets without regurgitation.  Echocardiogram 09/03/2015: LVEF 60 to 65%, moderate LVH, grade 2 diastolic dysfunction.  Left atrium  borderline size.  Trivial MR, mildly calcified aortic valve leaflets.  Aortic root mildly dilated.   Heart Catheterization 12/27/2017:   Widely patent coronary arteries with normal left main, 30% mid LAD, luminal irregularities in the proximal and mid circumflex, and 30 to 40% stenosis in the mid RCA.  Mild global left ventricular systolic dysfunction with EF 40 to 45%.  Severely elevated end-diastolic pressure consistent with chronic combined systolic and diastolic heart failure.  Severe poorly controlled blood pressure during the procedure.  Severe BP elevation during the procedure requiring IV  hydralazine, labetalol, and IV nitroglycerin drip.  Stable right femoral after Mynx arteriotomy closure device. RECOMMENDATIONS:  IV Lasix x1 and more aggressive outpatient diuresis Guideline directed therapy for systolic/diastolic heart failure.    Assessment & Plan:     ICD-10-CM   1. Morbid (severe) obesity due to excess calories (HCC)  E66.01   2. Mild persistent asthma without complication  W96.75   3. Allergic rhinitis, unspecified seasonality, unspecified trigger  J30.9     Mild persistent asthma-well-controlled on PFT -Continue Symbicort twice daily.  Can consider de-escalating at follow-up to ICS monotherapy. -Continue albuterol as needed. -Continue management for allergic rhinosinusitis  Allergic rhinosinusitis -Continue Flonase daily.  Continue azelastine twice daily. -Continue Singulair  Obesity; likely chronic obesity related restriction -Congratulated him on his weight loss efforts.     RTC in 6 months or sooner PRN.   Current Outpatient Medications:  .  albuterol (VENTOLIN HFA) 108 (90 Base) MCG/ACT inhaler, Inhale 2 puffs into the lungs 2 (two) times daily as needed for wheezing or shortness of breath., Disp: , Rfl:  .  aspirin EC 81 MG tablet, Take 1 tablet (81 mg total) by mouth daily with breakfast., Disp: 30 tablet, Rfl: 4 .  azelastine (ASTELIN) 0.1 % nasal spray, Place 2 sprays into both nostrils 2 (two) times daily. Use in each nostril as directed (Patient taking differently: Place 2 sprays into both nostrils 2 (two) times daily as needed. Use in each nostril as directed), Disp: 30 mL, Rfl: 12 .  azelastine (OPTIVAR) 0.05 % ophthalmic solution, Apply 1 drop to eye 2 (two) times daily., Disp: , Rfl:  .  budesonide-formoterol (SYMBICORT) 160-4.5 MCG/ACT inhaler, Inhale 2 puffs into the lungs 2 (two) times daily., Disp: , Rfl:  .  diltiazem (CARDIZEM CD) 120 MG 24 hr capsule, Take 1 capsule (120 mg total) by mouth daily., Disp: 90 capsule, Rfl: 3 .   gabapentin (NEURONTIN) 300 MG capsule, Take 100 mg by mouth 3 (three) times daily. , Disp: , Rfl:  .  hydrALAZINE (APRESOLINE) 100 MG tablet, Take 1 tablet (100 mg total) by mouth 2 (two) times daily., Disp: 180 tablet, Rfl: 3 .  ipratropium-albuterol (DUONEB) 0.5-2.5 (3) MG/3ML SOLN, Inhale 1 mL into the lungs as needed., Disp: , Rfl:  .  isosorbide mononitrate (IMDUR) 30 MG 24 hr tablet, Take 1 tablet (30 mg total) by mouth daily., Disp: 30 tablet, Rfl: 5 .  montelukast (SINGULAIR) 10 MG tablet, Take 10 mg by mouth at bedtime., Disp: , Rfl:  .  nebivolol 20 MG TABS, Take 1 tablet by mouth daily, Disp: 90 tablet, Rfl: 3 .  olmesartan-hydrochlorothiazide (BENICAR HCT) 40-25 MG tablet, Take 1 tablet by mouth daily., Disp: 90 tablet, Rfl: 3 .  polyethylene glycol (MIRALAX / GLYCOLAX) 17 g packet, Take 17 g by mouth daily., Disp: 30 each, Rfl: 2 .  RESTASIS 0.05 % ophthalmic emulsion, 1 drop 2 (two) times daily., Disp: , Rfl:  .  rosuvastatin (CRESTOR) 40 MG tablet, Take 40 mg by mouth daily., Disp: , Rfl:  .  spironolactone (ALDACTONE) 25 MG tablet, Take 1 tablet (25 mg total) by mouth daily., Disp: 90 tablet, Rfl: 3 .  tamsulosin (FLOMAX) 0.4 MG CAPS capsule, Take 0.4 mg by mouth daily., Disp: , Rfl:  .  testosterone cypionate (DEPOTESTOSTERONE CYPIONATE) 200 MG/ML injection, Inject 100 mg into the muscle once a week., Disp: , Rfl:  .  TRULICITY 1.5 YP/4.9YL SOPN, Inject 1.5 mg into the skin every Saturday. , Disp: , Rfl:      Julian Hy, DO [facility] 01/16/2020 1:44 PM

## 2020-01-23 ENCOUNTER — Other Ambulatory Visit: Payer: Self-pay

## 2020-01-23 ENCOUNTER — Encounter (INDEPENDENT_AMBULATORY_CARE_PROVIDER_SITE_OTHER): Payer: PPO | Admitting: Ophthalmology

## 2020-01-23 DIAGNOSIS — E11311 Type 2 diabetes mellitus with unspecified diabetic retinopathy with macular edema: Secondary | ICD-10-CM

## 2020-01-23 DIAGNOSIS — E113312 Type 2 diabetes mellitus with moderate nonproliferative diabetic retinopathy with macular edema, left eye: Secondary | ICD-10-CM

## 2020-01-24 ENCOUNTER — Other Ambulatory Visit: Payer: PPO

## 2020-01-28 ENCOUNTER — Other Ambulatory Visit: Payer: PPO

## 2020-01-28 ENCOUNTER — Other Ambulatory Visit: Payer: Self-pay

## 2020-01-28 DIAGNOSIS — R350 Frequency of micturition: Secondary | ICD-10-CM | POA: Diagnosis not present

## 2020-01-28 DIAGNOSIS — E1165 Type 2 diabetes mellitus with hyperglycemia: Secondary | ICD-10-CM | POA: Diagnosis not present

## 2020-01-28 DIAGNOSIS — N401 Enlarged prostate with lower urinary tract symptoms: Secondary | ICD-10-CM | POA: Diagnosis not present

## 2020-01-28 DIAGNOSIS — I10 Essential (primary) hypertension: Secondary | ICD-10-CM

## 2020-01-28 DIAGNOSIS — Z6841 Body Mass Index (BMI) 40.0 and over, adult: Secondary | ICD-10-CM | POA: Diagnosis not present

## 2020-01-28 DIAGNOSIS — I5032 Chronic diastolic (congestive) heart failure: Secondary | ICD-10-CM | POA: Diagnosis not present

## 2020-01-28 DIAGNOSIS — Z008 Encounter for other general examination: Secondary | ICD-10-CM | POA: Diagnosis not present

## 2020-01-28 DIAGNOSIS — Z794 Long term (current) use of insulin: Secondary | ICD-10-CM | POA: Diagnosis not present

## 2020-01-28 DIAGNOSIS — J449 Chronic obstructive pulmonary disease, unspecified: Secondary | ICD-10-CM | POA: Diagnosis not present

## 2020-01-28 DIAGNOSIS — I110 Hypertensive heart disease with heart failure: Secondary | ICD-10-CM | POA: Diagnosis not present

## 2020-01-29 ENCOUNTER — Telehealth: Payer: Self-pay | Admitting: Interventional Cardiology

## 2020-01-29 LAB — BASIC METABOLIC PANEL
BUN/Creatinine Ratio: 14 (ref 10–24)
BUN: 21 mg/dL (ref 8–27)
CO2: 25 mmol/L (ref 20–29)
Calcium: 9.6 mg/dL (ref 8.6–10.2)
Chloride: 99 mmol/L (ref 96–106)
Creatinine, Ser: 1.5 mg/dL — ABNORMAL HIGH (ref 0.76–1.27)
GFR calc Af Amer: 55 mL/min/{1.73_m2} — ABNORMAL LOW (ref >59)
GFR calc non Af Amer: 48 mL/min/{1.73_m2} — ABNORMAL LOW (ref >59)
Glucose: 252 mg/dL — ABNORMAL HIGH (ref 65–99)
Potassium: 4 mmol/L (ref 3.5–5.2)
Sodium: 137 mmol/L (ref 134–144)

## 2020-01-29 NOTE — Telephone Encounter (Signed)
Called patient again. No answer. Left VM again with our direct phone number for patient to call back.

## 2020-01-29 NOTE — Telephone Encounter (Signed)
  Patient states he is returning a call but not sure from who.

## 2020-01-29 NOTE — Telephone Encounter (Signed)
Looks like [facility] tried to call the pt. I will send the call to [facility].

## 2020-01-30 NOTE — Telephone Encounter (Signed)
Spoke with patient. I have asked him to hold his spironolactone for 2 days then restart at lower dose of 1/2 tablet daily (12.5mg ). Will recheck when he is in the clinic on 7/16.

## 2020-02-14 ENCOUNTER — Other Ambulatory Visit: Payer: Self-pay

## 2020-02-14 ENCOUNTER — Ambulatory Visit (INDEPENDENT_AMBULATORY_CARE_PROVIDER_SITE_OTHER): Payer: PPO | Admitting: Pharmacist

## 2020-02-14 VITALS — BP 144/62 | HR 75

## 2020-02-14 DIAGNOSIS — I10 Essential (primary) hypertension: Secondary | ICD-10-CM

## 2020-02-14 LAB — BASIC METABOLIC PANEL
BUN/Creatinine Ratio: 8 — ABNORMAL LOW (ref 10–24)
BUN: 10 mg/dL (ref 8–27)
CO2: 24 mmol/L (ref 20–29)
Calcium: 8.8 mg/dL (ref 8.6–10.2)
Chloride: 96 mmol/L (ref 96–106)
Creatinine, Ser: 1.25 mg/dL (ref 0.76–1.27)
GFR calc Af Amer: 69 mL/min/{1.73_m2} (ref >59)
GFR calc non Af Amer: 60 mL/min/{1.73_m2} (ref >59)
Glucose: 401 mg/dL — ABNORMAL HIGH (ref 65–99)
Potassium: 4.1 mmol/L (ref 3.5–5.2)
Sodium: 135 mmol/L (ref 134–144)

## 2020-02-14 MED ORDER — SPIRONOLACTONE 25 MG PO TABS: 12.5000 mg | ORAL_TABLET | Freq: Every day | ORAL | 3 refills | Status: DC

## 2020-02-14 MED ORDER — NEBIVOLOL HCL 20 MG PO TABS: 40.0000 mg | ORAL_TABLET | Freq: Every day | ORAL | 3 refills | Status: DC

## 2020-02-14 NOTE — Patient Instructions (Addendum)
It was nice to see you today  Your blood pressure goal is < 130/76mmHg  Increase your Bystolic (nebivolol) to 2 tablets (total of 40mg ) once daily  Continue taking for your blood pressure: hydralazine 100mg  twice daily, Imdur 30mg  once daily, Benicar-HCTZ 40-25mg  once daily, diltiazem 120mg  daily, and spironolactone 12.5mg  (this is 1/2 tablet) once daily   Try to increase your walking to 5 days a week  Limit sodium intake to < 2,000mg  daily  Follow up in clinic in 3 months  Continue to monitor your blood pressure at home and call clinic [phone] if your readings stay elevated above goal <130/84mmHg

## 2020-02-14 NOTE — Progress Notes (Signed)
Patient ID: Vincent Ramos                 DOB: 1951-07-15                      MRN: [id number]     HPI: Vincent Ramos is a 69 y.o. male referred by Dr. Irish Lack to HTN clinic. PMH is significant for HTN, renal duplex negative for renal artery stenosis, diastolic CHF, nonobstructive CAD, COPD and DM2. He was last seen on 01/13/20 in HTN clinic and BP remained slightly elevated at 134/70. His spironolactone was increased from 12.5mg  to 25mg  daily, however this caused his SCr to increase from 1.17 to 1.5. He was advised to hold his spironolactone for 2 days then reduce back to 12.5mg  daily. Previously could not tolerate TID hydralazine due to headaches but has been tolerating BID dosing.  Pt presents in good spirits today. Reports tolerating medications well, wishes to discontinue some of his medications if possible since he takes so many. Noted occasional dizziness when he took most meds in the AM, splitting them up has resolved dizziness. Has been compliant with meds, 1 missed dose in the past few weeks. Confirmed he is taking Bystolic 20mg  daily and spironolactone 12.5mg  daily as dose changes were made recently with both meds. Home BP has ranged 122-482 systolic per pt reports. Checks BP a few hours after taking meds. He has been staying hydrated with water, denies NSAID use.  Has been walking 3 days a week for about 20 minutes. Has not been drinking coffee for the past month but plans to start drinking 1 caffeinated cup per day. Has cut back on regular soda. Eats out twice a week - likes [place] fajitas with beef and vegetables.  Current HTN meds: hydralazine 100mg  BID, Imdur 30mg  daily, Bystolic 20 mg daily, olmesartan-HCTZ 40-25mg  daily, diltiazem 120mg  daily (has palpitations too), spironolactone 12.5 mg daily  Previously tried: hydralazine 100mg  TID - headaches, spironolactone 25mg  daily - SCr increase  BP goal: <130/57mmHg  Family History: The patient's family history includes CAD in an  other family member; Cancer in an other family member; Diabetes in his sister and another family member; Hypertension in his sister and another family member; Lung cancer in his mother.   Social History: The patient reports that he has never smoked. He has never used smokeless tobacco. He reports that he does not drink alcohol or use drugs.  Diet: Breakfast - peanut butter sandwich. Lunch - baked chicken. Dinner - fish, shrimp, chicken, tacos. Drinks - water, rarely drinks soda, plans to start drinking 1 cup of coffee each day. Does not add salt to food.   Exercise: walks 20 minutes 3 days a week.  Wt Readings from Last 3 Encounters:  11/11/19 266 lb 6.4 oz (120.8 kg)  10/15/19 266 lb 6.4 oz (120.8 kg)  05/09/19 270 lb 11.6 oz (122.8 kg)   BP Readings from Last 3 Encounters:  01/13/20 134/70  12/23/19 (!) 140/58  11/25/19 (!) 166/72   Pulse Readings from Last 3 Encounters:  01/13/20 75  12/23/19 84  11/25/19 80    Renal function: CrCl cannot be calculated (Unknown ideal weight.).  Past Medical History:  Diagnosis Date  . Abnormal nuclear cardiac imaging test 12/27/2017  . Acute on chronic diastolic CHF (congestive heart failure) ([place]) 09/17/2017  . Acute respiratory failure with hypoxia ([place]) 06/01/2017  . Arthritis   . Asthma   . CAD (coronary artery disease)  a. 11/2017: cath showing widely patent coronary arteries with normal left main, 30% mid LAD, luminal irregularities in the proximal and mid circumflex, and 30 to 40% stenosis in the mid RCA.  Marland Kitchen CAP (community acquired pneumonia) 07/31/2014  . Community acquired pneumonia 07/31/2014  . Constipation 02/06/2018  . COPD exacerbation ([place]) 09/17/2017  . Dysphagia   . Elevated troponin 09/17/2017  . Enlarged prostate   . Grade II diastolic dysfunction   . Hypertension   . Lumbar stenosis 10/07/2015  . Obesity 07/31/2014  . PNA (pneumonia) 05/30/2017  . Pneumonia   . Pneumonia   . Polyp of rectum   . Respiratory failure  ([place]) 09/18/2017  . SBO (small bowel obstruction) ([place]) 05/08/2019  . Shortness of breath   . Type 2 diabetes mellitus ([place])     Current Outpatient Medications on File Prior to Visit  Medication Sig Dispense Refill  . albuterol (VENTOLIN HFA) 108 (90 Base) MCG/ACT inhaler Inhale 2 puffs into the lungs 2 (two) times daily as needed for wheezing or shortness of breath.    Marland Kitchen aspirin EC 81 MG tablet Take 1 tablet (81 mg total) by mouth daily with breakfast. 30 tablet 4  . azelastine (ASTELIN) 0.1 % nasal spray Place 2 sprays into both nostrils 2 (two) times daily. Use in each nostril as directed (Patient taking differently: Place 2 sprays into both nostrils 2 (two) times daily as needed. Use in each nostril as directed) 30 mL 12  . azelastine (OPTIVAR) 0.05 % ophthalmic solution Apply 1 drop to eye 2 (two) times daily.    . budesonide-formoterol (SYMBICORT) 160-4.5 MCG/ACT inhaler Inhale 2 puffs into the lungs 2 (two) times daily.    Marland Kitchen diltiazem (CARDIZEM CD) 120 MG 24 hr capsule Take 1 capsule (120 mg total) by mouth daily. 90 capsule 3  . gabapentin (NEURONTIN) 300 MG capsule Take 100 mg by mouth 3 (three) times daily.     . hydrALAZINE (APRESOLINE) 100 MG tablet Take 1 tablet (100 mg total) by mouth 2 (two) times daily. 180 tablet 3  . ipratropium-albuterol (DUONEB) 0.5-2.5 (3) MG/3ML SOLN Inhale 1 mL into the lungs as needed.    . isosorbide mononitrate (IMDUR) 30 MG 24 hr tablet Take 1 tablet (30 mg total) by mouth daily. 30 tablet 5  . montelukast (SINGULAIR) 10 MG tablet Take 10 mg by mouth at bedtime.    . nebivolol 20 MG TABS Take 1 tablet by mouth daily 90 tablet 3  . olmesartan-hydrochlorothiazide (BENICAR HCT) 40-25 MG tablet Take 1 tablet by mouth daily. 90 tablet 3  . polyethylene glycol (MIRALAX / GLYCOLAX) 17 g packet Take 17 g by mouth daily. 30 each 2  . RESTASIS 0.05 % ophthalmic emulsion 1 drop 2 (two) times daily.    . rosuvastatin (CRESTOR) 40 MG tablet Take 40 mg by mouth  daily.    Marland Kitchen spironolactone (ALDACTONE) 25 MG tablet Take 1 tablet (25 mg total) by mouth daily. 90 tablet 3  . tamsulosin (FLOMAX) 0.4 MG CAPS capsule Take 0.4 mg by mouth daily.    Marland Kitchen testosterone cypionate (DEPOTESTOSTERONE CYPIONATE) 200 MG/ML injection Inject 100 mg into the muscle once a week.    . TRULICITY 1.5 WU/9.8JX SOPN Inject 1.5 mg into the skin every Saturday.      No current facility-administered medications on file prior to visit.    Allergies  Allergen Reactions  . Amlodipine     Felt "jittery" constantly  . Novolog [Insulin Aspart] Hives, Itching and Rash  .  Victoza [Liraglutide] Itching and Rash     Assessment/Plan:  1. Hypertension - BP remains elevated above goal <130/60mmHg. Will increase Bystolic to 40mg  daily. Rechecking BMET today with dose decrease of spironolactone (SCr previously elevated on 25mg  daily). Continue spironolactone 12.5mg  daily, hydralazine 100mg  BID (headaches on TID dosing), olmesartan-HCTZ 40-25mg  daily, Imdur 30mg  daily, and diltiazem 120mg  daily. Pt wishes to stop some of his medication, discussed that we cannot stop any BP medications currently since his BP is still uncontrolled. Discussed the benefits of lifestyle intervention on BP. Encouraged pt to lose weight, increase walking to 5 days a week, and limit daily sodium to < 2,000mg  daily.   Follow up in HTN clinic in 3 months per pt request.  Suzana Sohail E. Louie Meaders, PharmD, BCACP, [place] [address] [place], [place] [address] Phone: [phone]; Fax: [phone] 02/14/2020 10:47 AM

## 2020-02-21 ENCOUNTER — Other Ambulatory Visit: Payer: Self-pay

## 2020-02-21 ENCOUNTER — Encounter (INDEPENDENT_AMBULATORY_CARE_PROVIDER_SITE_OTHER): Payer: PPO | Admitting: Ophthalmology

## 2020-02-21 DIAGNOSIS — E11311 Type 2 diabetes mellitus with unspecified diabetic retinopathy with macular edema: Secondary | ICD-10-CM

## 2020-02-21 DIAGNOSIS — H43813 Vitreous degeneration, bilateral: Secondary | ICD-10-CM | POA: Diagnosis not present

## 2020-02-21 DIAGNOSIS — H33301 Unspecified retinal break, right eye: Secondary | ICD-10-CM

## 2020-02-21 DIAGNOSIS — E113291 Type 2 diabetes mellitus with mild nonproliferative diabetic retinopathy without macular edema, right eye: Secondary | ICD-10-CM | POA: Diagnosis not present

## 2020-02-21 DIAGNOSIS — E113312 Type 2 diabetes mellitus with moderate nonproliferative diabetic retinopathy with macular edema, left eye: Secondary | ICD-10-CM | POA: Diagnosis not present

## 2020-02-21 DIAGNOSIS — H35033 Hypertensive retinopathy, bilateral: Secondary | ICD-10-CM | POA: Diagnosis not present

## 2020-02-21 DIAGNOSIS — H2513 Age-related nuclear cataract, bilateral: Secondary | ICD-10-CM

## 2020-02-21 DIAGNOSIS — I10 Essential (primary) hypertension: Secondary | ICD-10-CM

## 2020-03-20 ENCOUNTER — Encounter (INDEPENDENT_AMBULATORY_CARE_PROVIDER_SITE_OTHER): Payer: PPO | Admitting: Ophthalmology

## 2020-03-20 ENCOUNTER — Other Ambulatory Visit: Payer: Self-pay

## 2020-03-20 DIAGNOSIS — E113291 Type 2 diabetes mellitus with mild nonproliferative diabetic retinopathy without macular edema, right eye: Secondary | ICD-10-CM

## 2020-03-20 DIAGNOSIS — H2513 Age-related nuclear cataract, bilateral: Secondary | ICD-10-CM | POA: Diagnosis not present

## 2020-03-20 DIAGNOSIS — E11311 Type 2 diabetes mellitus with unspecified diabetic retinopathy with macular edema: Secondary | ICD-10-CM

## 2020-03-20 DIAGNOSIS — E113312 Type 2 diabetes mellitus with moderate nonproliferative diabetic retinopathy with macular edema, left eye: Secondary | ICD-10-CM | POA: Diagnosis not present

## 2020-03-20 DIAGNOSIS — H35033 Hypertensive retinopathy, bilateral: Secondary | ICD-10-CM

## 2020-03-20 DIAGNOSIS — H33301 Unspecified retinal break, right eye: Secondary | ICD-10-CM

## 2020-03-20 DIAGNOSIS — H43813 Vitreous degeneration, bilateral: Secondary | ICD-10-CM | POA: Diagnosis not present

## 2020-03-20 DIAGNOSIS — I10 Essential (primary) hypertension: Secondary | ICD-10-CM | POA: Diagnosis not present

## 2020-03-24 DIAGNOSIS — Z794 Long term (current) use of insulin: Secondary | ICD-10-CM | POA: Diagnosis not present

## 2020-03-24 DIAGNOSIS — M4802 Spinal stenosis, cervical region: Secondary | ICD-10-CM | POA: Diagnosis not present

## 2020-03-24 DIAGNOSIS — I110 Hypertensive heart disease with heart failure: Secondary | ICD-10-CM | POA: Diagnosis not present

## 2020-03-24 DIAGNOSIS — M545 Low back pain: Secondary | ICD-10-CM | POA: Diagnosis not present

## 2020-03-24 DIAGNOSIS — E1136 Type 2 diabetes mellitus with diabetic cataract: Secondary | ICD-10-CM | POA: Diagnosis not present

## 2020-03-24 DIAGNOSIS — J449 Chronic obstructive pulmonary disease, unspecified: Secondary | ICD-10-CM | POA: Diagnosis not present

## 2020-03-24 DIAGNOSIS — E1165 Type 2 diabetes mellitus with hyperglycemia: Secondary | ICD-10-CM | POA: Diagnosis not present

## 2020-03-24 DIAGNOSIS — Z008 Encounter for other general examination: Secondary | ICD-10-CM | POA: Diagnosis not present

## 2020-03-24 DIAGNOSIS — I5032 Chronic diastolic (congestive) heart failure: Secondary | ICD-10-CM | POA: Diagnosis not present

## 2020-04-13 ENCOUNTER — Telehealth (HOSPITAL_COMMUNITY): Payer: Self-pay | Admitting: Vascular Surgery

## 2020-04-13 NOTE — Progress Notes (Signed)
Received a phone call from [facility] with Dr. Marcello Moores (Neurosurgery). Patient is being considered for cervical fusion. They have obtained preoperative cardiology (Dr. Irish Lack) and pulmonology (Dr. Carlis Abbott) input. He is also being followed at the HTN Clinic. She is in communication with primary care as well--wanting to inquire about diabetes control. She will follow-up with patient prior to booking case.   Myra Gianotti, PA-C Surgical Short Stay/Anesthesiology [facility] Phone [phone] 04/13/2020 3:07 PM

## 2020-04-16 DIAGNOSIS — N401 Enlarged prostate with lower urinary tract symptoms: Secondary | ICD-10-CM | POA: Diagnosis not present

## 2020-04-17 ENCOUNTER — Encounter (INDEPENDENT_AMBULATORY_CARE_PROVIDER_SITE_OTHER): Payer: PPO | Admitting: Ophthalmology

## 2020-04-17 ENCOUNTER — Other Ambulatory Visit: Payer: Self-pay

## 2020-04-17 DIAGNOSIS — I10 Essential (primary) hypertension: Secondary | ICD-10-CM

## 2020-04-17 DIAGNOSIS — E11311 Type 2 diabetes mellitus with unspecified diabetic retinopathy with macular edema: Secondary | ICD-10-CM | POA: Diagnosis not present

## 2020-04-17 DIAGNOSIS — H33301 Unspecified retinal break, right eye: Secondary | ICD-10-CM

## 2020-04-17 DIAGNOSIS — H35033 Hypertensive retinopathy, bilateral: Secondary | ICD-10-CM | POA: Diagnosis not present

## 2020-04-17 DIAGNOSIS — H43813 Vitreous degeneration, bilateral: Secondary | ICD-10-CM

## 2020-04-17 DIAGNOSIS — E113312 Type 2 diabetes mellitus with moderate nonproliferative diabetic retinopathy with macular edema, left eye: Secondary | ICD-10-CM

## 2020-04-17 DIAGNOSIS — E113391 Type 2 diabetes mellitus with moderate nonproliferative diabetic retinopathy without macular edema, right eye: Secondary | ICD-10-CM | POA: Diagnosis not present

## 2020-04-22 DIAGNOSIS — N401 Enlarged prostate with lower urinary tract symptoms: Secondary | ICD-10-CM | POA: Diagnosis not present

## 2020-04-22 DIAGNOSIS — R972 Elevated prostate specific antigen [PSA]: Secondary | ICD-10-CM | POA: Diagnosis not present

## 2020-04-22 DIAGNOSIS — E291 Testicular hypofunction: Secondary | ICD-10-CM | POA: Diagnosis not present

## 2020-04-22 DIAGNOSIS — R3915 Urgency of urination: Secondary | ICD-10-CM | POA: Diagnosis not present

## 2020-04-22 DIAGNOSIS — N5201 Erectile dysfunction due to arterial insufficiency: Secondary | ICD-10-CM | POA: Diagnosis not present

## 2020-04-24 DIAGNOSIS — Z6841 Body Mass Index (BMI) 40.0 and over, adult: Secondary | ICD-10-CM | POA: Diagnosis not present

## 2020-04-24 DIAGNOSIS — Z01818 Encounter for other preprocedural examination: Secondary | ICD-10-CM | POA: Diagnosis not present

## 2020-04-24 DIAGNOSIS — J4540 Moderate persistent asthma, uncomplicated: Secondary | ICD-10-CM | POA: Diagnosis not present

## 2020-04-24 DIAGNOSIS — Z794 Long term (current) use of insulin: Secondary | ICD-10-CM | POA: Diagnosis not present

## 2020-04-24 DIAGNOSIS — I5032 Chronic diastolic (congestive) heart failure: Secondary | ICD-10-CM | POA: Diagnosis not present

## 2020-04-24 DIAGNOSIS — I25118 Atherosclerotic heart disease of native coronary artery with other forms of angina pectoris: Secondary | ICD-10-CM | POA: Diagnosis not present

## 2020-04-24 DIAGNOSIS — E1165 Type 2 diabetes mellitus with hyperglycemia: Secondary | ICD-10-CM | POA: Diagnosis not present

## 2020-04-24 DIAGNOSIS — I110 Hypertensive heart disease with heart failure: Secondary | ICD-10-CM | POA: Diagnosis not present

## 2020-04-24 DIAGNOSIS — Z008 Encounter for other general examination: Secondary | ICD-10-CM | POA: Diagnosis not present

## 2020-04-24 DIAGNOSIS — Z719 Counseling, unspecified: Secondary | ICD-10-CM | POA: Diagnosis not present

## 2020-05-11 ENCOUNTER — Telehealth: Payer: Self-pay | Admitting: Critical Care Medicine

## 2020-05-11 DIAGNOSIS — Z6841 Body Mass Index (BMI) 40.0 and over, adult: Secondary | ICD-10-CM | POA: Diagnosis not present

## 2020-05-11 DIAGNOSIS — D689 Coagulation defect, unspecified: Secondary | ICD-10-CM | POA: Diagnosis not present

## 2020-05-11 DIAGNOSIS — Z0000 Encounter for general adult medical examination without abnormal findings: Secondary | ICD-10-CM | POA: Diagnosis not present

## 2020-05-11 DIAGNOSIS — I25118 Atherosclerotic heart disease of native coronary artery with other forms of angina pectoris: Secondary | ICD-10-CM | POA: Diagnosis not present

## 2020-05-11 DIAGNOSIS — J449 Chronic obstructive pulmonary disease, unspecified: Secondary | ICD-10-CM | POA: Diagnosis not present

## 2020-05-11 DIAGNOSIS — E1136 Type 2 diabetes mellitus with diabetic cataract: Secondary | ICD-10-CM | POA: Diagnosis not present

## 2020-05-11 DIAGNOSIS — Z008 Encounter for other general examination: Secondary | ICD-10-CM | POA: Diagnosis not present

## 2020-05-11 DIAGNOSIS — I5032 Chronic diastolic (congestive) heart failure: Secondary | ICD-10-CM | POA: Diagnosis not present

## 2020-05-11 DIAGNOSIS — Z794 Long term (current) use of insulin: Secondary | ICD-10-CM | POA: Diagnosis not present

## 2020-05-11 DIAGNOSIS — I110 Hypertensive heart disease with heart failure: Secondary | ICD-10-CM | POA: Diagnosis not present

## 2020-05-11 NOTE — Telephone Encounter (Signed)
Spoke to patient, who stated that PCP recommended that he contact our office for nebulizer machine and albuterol solution.   Dr. Carlis Abbott, please advise. Thanks.

## 2020-05-11 NOTE — Telephone Encounter (Signed)
Is there a reason his PCP can't? I'm fine if they think he needs it, but he as well-controlled when we last saw him so I am a little worried about what is going on. You can prescribe it but it would be appropriate for him to have a follow up with [place] to get new therapy if he is uncontrolled.  Julian Hy, DO 05/11/20 5:35 PM [facility]

## 2020-05-11 NOTE — Telephone Encounter (Signed)
ATC patient to discuss this matter. Patient requested to call back in 2 minutes due to poor signal.  Will await call back.

## 2020-05-11 NOTE — Telephone Encounter (Signed)
Pt returning a phone call. Pt can be reached at [phone].

## 2020-05-12 NOTE — Telephone Encounter (Signed)
I think it is most important that he have albuterol available, which he needs to be able to take with him. If he has been well controlled, I think a spacer and albuterol inhaler should be sufficient. Please hold off on prescribing nebulizer.  LPC

## 2020-05-12 NOTE — Telephone Encounter (Signed)
Spoke with the pt and notified of response per Dr Carlis Abbott and he verbalized understanding. He states he has albuterol inhaler and a spacer.

## 2020-05-12 NOTE — Telephone Encounter (Signed)
Spoke with the pt  He states overall does feel controlled just has occ episodes of wheezing  He states PCP wanted to have [place] prescribe the neb and supplies  He has some Duoneb at home that has expired  Did you want to prescribe this or just plain albuterol  Please advise, thanks!

## 2020-05-14 ENCOUNTER — Other Ambulatory Visit: Payer: Self-pay

## 2020-05-14 ENCOUNTER — Ambulatory Visit (INDEPENDENT_AMBULATORY_CARE_PROVIDER_SITE_OTHER): Payer: PPO | Admitting: Pharmacist

## 2020-05-14 VITALS — BP 132/62 | HR 68 | Wt 275.0 lb

## 2020-05-14 DIAGNOSIS — I10 Essential (primary) hypertension: Secondary | ICD-10-CM

## 2020-05-14 NOTE — Progress Notes (Signed)
Patient ID: Vincent Ramos                 DOB: 06/05/1951                      MRN: [id number]     HPI: Vincent Ramos is a 69 y.o. male referred by Dr. Irish Lack to HTN clinic. PMH is significant for HTN, renal duplex negative for renal artery stenosis, diastolic CHF, nonobstructive CAD, COPD and DM2. He was last seen on 01/13/20 in HTN clinic and BP remained slightly elevated at 134/70. His spironolactone was increased from 12.5mg  to 25mg  daily, however this caused his SCr to increase from 1.17 to 1.5. He was advised to hold his spironolactone for 2 days then reduce back to 12.5mg  daily. Previously could not tolerate TID hydralazine due to headaches but has been tolerating BID dosing. At last visit 7/16, BP was elevated at 937/16 and Bystolic was increased to 40mg  daily. Pt was encouraged to increase walking and limit sodium intake.  Pt presents in good spirits today. Reports tolerating medications well. Denies dizziness, falls, palpitations. Home BP has ranged 967-893Y systolic per pt reports. BP at visit earlier this week was 135/70. Checks BP a few hours after taking meds. He has been staying hydrated with water, denies NSAID use. Hasn't taken his spironolactone yet this morning but took other AM BP meds 2-3 hours ago. Had 1 cup of coffee today. Did have a few beers last night at a party. Has a nutritionist coming out to his house next week. Trying to watch his salt intake, doesn't add salt to food.   Has been walking 3 days a week for about 10-15 minutes, sometimes up to 30 minutes if his breathing is ok. Has a rescue inhaler and also uses his Symbicort daily.  Current HTN meds: hydralazine 100mg  BID, Imdur 30mg  daily, Bystolic 40 mg daily, olmesartan-HCTZ 40-25mg  daily, diltiazem 120mg  daily (has palpitations too), spironolactone 12.5 mg daily  Previously tried: hydralazine 100mg  TID - headaches, spironolactone 25mg  daily - SCr increase  BP goal: <130/79mmHg  Family History: The patient's  family history includes CAD in an other family member; Cancer in an other family member; Diabetes in his sister and another family member; Hypertension in his sister and another family member; Lung cancer in his mother.   Social History: The patient reports that he has never smoked. He has never used smokeless tobacco. He reports that he does not drink alcohol or use drugs.  Diet: Breakfast - peanut butter sandwich. Lunch - baked chicken. Dinner - fish, shrimp, chicken, tacos. Drinks - water, rarely drinks soda, plans to start drinking 1 cup of coffee each day. Does not add salt to food.   Exercise: walks 20 minutes 3 days a week.  Wt Readings from Last 3 Encounters:  11/11/19 266 lb 6.4 oz (120.8 kg)  10/15/19 266 lb 6.4 oz (120.8 kg)  05/09/19 270 lb 11.6 oz (122.8 kg)   BP Readings from Last 3 Encounters:  02/14/20 (!) 144/62  01/13/20 134/70  12/23/19 (!) 140/58   Pulse Readings from Last 3 Encounters:  02/14/20 75  01/13/20 75  12/23/19 84    Renal function: CrCl cannot be calculated (Patient's most recent lab result is older than the maximum 21 days allowed.).  Past Medical History:  Diagnosis Date  . Abnormal nuclear cardiac imaging test 12/27/2017  . Acute on chronic diastolic CHF (congestive heart failure) ([place]) 09/17/2017  . Acute respiratory failure  with hypoxia ([place]) 06/01/2017  . Arthritis   . Asthma   . CAD (coronary artery disease)    a. 11/2017: cath showing widely patent coronary arteries with normal left main, 30% mid LAD, luminal irregularities in the proximal and mid circumflex, and 30 to 40% stenosis in the mid RCA.  Marland Kitchen CAP (community acquired pneumonia) 07/31/2014  . Community acquired pneumonia 07/31/2014  . Constipation 02/06/2018  . COPD exacerbation ([place]) 09/17/2017  . Dysphagia   . Elevated troponin 09/17/2017  . Enlarged prostate   . Grade II diastolic dysfunction   . Hypertension   . Lumbar stenosis 10/07/2015  . Obesity 07/31/2014  . PNA (pneumonia)  05/30/2017  . Pneumonia   . Pneumonia   . Polyp of rectum   . Respiratory failure ([place]) 09/18/2017  . SBO (small bowel obstruction) ([place]) 05/08/2019  . Shortness of breath   . Type 2 diabetes mellitus ([place])     Current Outpatient Medications on File Prior to Visit  Medication Sig Dispense Refill  . albuterol (VENTOLIN HFA) 108 (90 Base) MCG/ACT inhaler Inhale 2 puffs into the lungs 2 (two) times daily as needed for wheezing or shortness of breath.    Marland Kitchen aspirin EC 81 MG tablet Take 1 tablet (81 mg total) by mouth daily with breakfast. 30 tablet 4  . azelastine (ASTELIN) 0.1 % nasal spray Place 2 sprays into both nostrils 2 (two) times daily. Use in each nostril as directed (Patient taking differently: Place 2 sprays into both nostrils 2 (two) times daily as needed. Use in each nostril as directed) 30 mL 12  . azelastine (OPTIVAR) 0.05 % ophthalmic solution Apply 1 drop to eye 2 (two) times daily.    . budesonide-formoterol (SYMBICORT) 160-4.5 MCG/ACT inhaler Inhale 2 puffs into the lungs 2 (two) times daily.    Marland Kitchen diltiazem (CARDIZEM CD) 120 MG 24 hr capsule Take 1 capsule (120 mg total) by mouth daily. 90 capsule 3  . gabapentin (NEURONTIN) 300 MG capsule Take 100 mg by mouth 3 (three) times daily.     . hydrALAZINE (APRESOLINE) 100 MG tablet Take 1 tablet (100 mg total) by mouth 2 (two) times daily. 180 tablet 3  . Insulin Degludec (TRESIBA FLEXTOUCH [place]) Inject 70 Units into the skin daily.    Marland Kitchen ipratropium-albuterol (DUONEB) 0.5-2.5 (3) MG/3ML SOLN Inhale 1 mL into the lungs as needed.    . isosorbide mononitrate (IMDUR) 30 MG 24 hr tablet Take 1 tablet (30 mg total) by mouth daily. 30 tablet 5  . montelukast (SINGULAIR) 10 MG tablet Take 10 mg by mouth at bedtime.    . Nebivolol HCl 20 MG TABS Take 2 tablets (40 mg total) by mouth daily. 180 tablet 3  . olmesartan-hydrochlorothiazide (BENICAR HCT) 40-25 MG tablet Take 1 tablet by mouth daily. 90 tablet 3  . polyethylene glycol (MIRALAX /  GLYCOLAX) 17 g packet Take 17 g by mouth daily. 30 each 2  . RESTASIS 0.05 % ophthalmic emulsion 1 drop 2 (two) times daily.    . rosuvastatin (CRESTOR) 40 MG tablet Take 40 mg by mouth daily.    Marland Kitchen spironolactone (ALDACTONE) 25 MG tablet Take 0.5 tablets (12.5 mg total) by mouth daily. 45 tablet 3  . tamsulosin (FLOMAX) 0.4 MG CAPS capsule Take 0.4 mg by mouth daily.    Marland Kitchen testosterone cypionate (DEPOTESTOSTERONE CYPIONATE) 200 MG/ML injection Inject 100 mg into the muscle once a week.    . TRULICITY 1.5 OT/1.5BW SOPN Inject 1.5 mg into the skin every  Saturday.      No current facility-administered medications on file prior to visit.    Allergies  Allergen Reactions  . Amlodipine     Felt "jittery" constantly  . Novolog [Insulin Aspart] Hives, Itching and Rash  . Victoza [Liraglutide] Itching and Rash     Assessment/Plan:  1. Hypertension - BP improved on recheck today and is very close to goal <130.20mmHg. Pt has not taken his AM dose of spironolactone yet. Home readings consistently at goal. Will continue Bystolic 40mg  daily, spironolactone 12.5mg  daily, hydralazine 100mg  BID (headaches on TID dosing), olmesartan-HCTZ 40-25mg  daily, Imdur 30mg  daily, and diltiazem 120mg  daily. Encouraged pt to increase walking to 5 days a week for 30 minutes, and limit daily sodium to < 2,000mg  daily.   Follow up in HTN clinic as needed.  Tyesha Joffe E. Mahonri Seiden, PharmD, BCACP, [place] [address], [place], [place] [address] Phone: [phone]; Fax: [phone] 05/14/2020 7:16 AM

## 2020-05-14 NOTE — Patient Instructions (Addendum)
It was nice to see you today  Your blood pressure goal is < 130/29mmHg  Continue taking your current medications  Limit your caffeine intake to 1-2 servings every day  Limit your salt intake to 000mg  every day  Aim to walk for 30 minutes 5 days a week

## 2020-05-18 ENCOUNTER — Encounter (INDEPENDENT_AMBULATORY_CARE_PROVIDER_SITE_OTHER): Payer: PPO | Admitting: Ophthalmology

## 2020-05-18 ENCOUNTER — Other Ambulatory Visit: Payer: Self-pay

## 2020-05-18 DIAGNOSIS — E113312 Type 2 diabetes mellitus with moderate nonproliferative diabetic retinopathy with macular edema, left eye: Secondary | ICD-10-CM

## 2020-05-18 DIAGNOSIS — H43813 Vitreous degeneration, bilateral: Secondary | ICD-10-CM

## 2020-05-18 DIAGNOSIS — E113291 Type 2 diabetes mellitus with mild nonproliferative diabetic retinopathy without macular edema, right eye: Secondary | ICD-10-CM

## 2020-05-18 DIAGNOSIS — H35033 Hypertensive retinopathy, bilateral: Secondary | ICD-10-CM | POA: Diagnosis not present

## 2020-05-18 DIAGNOSIS — I10 Essential (primary) hypertension: Secondary | ICD-10-CM | POA: Diagnosis not present

## 2020-05-18 DIAGNOSIS — E11311 Type 2 diabetes mellitus with unspecified diabetic retinopathy with macular edema: Secondary | ICD-10-CM

## 2020-06-09 DIAGNOSIS — E1165 Type 2 diabetes mellitus with hyperglycemia: Secondary | ICD-10-CM | POA: Diagnosis not present

## 2020-06-09 DIAGNOSIS — Z794 Long term (current) use of insulin: Secondary | ICD-10-CM | POA: Diagnosis not present

## 2020-06-09 DIAGNOSIS — M5412 Radiculopathy, cervical region: Secondary | ICD-10-CM | POA: Diagnosis not present

## 2020-06-09 DIAGNOSIS — Z008 Encounter for other general examination: Secondary | ICD-10-CM | POA: Diagnosis not present

## 2020-06-09 DIAGNOSIS — I110 Hypertensive heart disease with heart failure: Secondary | ICD-10-CM | POA: Diagnosis not present

## 2020-06-09 DIAGNOSIS — Z6841 Body Mass Index (BMI) 40.0 and over, adult: Secondary | ICD-10-CM | POA: Diagnosis not present

## 2020-06-09 DIAGNOSIS — J449 Chronic obstructive pulmonary disease, unspecified: Secondary | ICD-10-CM | POA: Diagnosis not present

## 2020-06-09 DIAGNOSIS — I5032 Chronic diastolic (congestive) heart failure: Secondary | ICD-10-CM | POA: Diagnosis not present

## 2020-06-15 ENCOUNTER — Other Ambulatory Visit: Payer: Self-pay

## 2020-06-15 ENCOUNTER — Encounter (INDEPENDENT_AMBULATORY_CARE_PROVIDER_SITE_OTHER): Payer: PPO | Admitting: Ophthalmology

## 2020-06-15 DIAGNOSIS — E113391 Type 2 diabetes mellitus with moderate nonproliferative diabetic retinopathy without macular edema, right eye: Secondary | ICD-10-CM

## 2020-06-15 DIAGNOSIS — H43813 Vitreous degeneration, bilateral: Secondary | ICD-10-CM | POA: Diagnosis not present

## 2020-06-15 DIAGNOSIS — H35033 Hypertensive retinopathy, bilateral: Secondary | ICD-10-CM | POA: Diagnosis not present

## 2020-06-15 DIAGNOSIS — H33301 Unspecified retinal break, right eye: Secondary | ICD-10-CM | POA: Diagnosis not present

## 2020-06-15 DIAGNOSIS — E11311 Type 2 diabetes mellitus with unspecified diabetic retinopathy with macular edema: Secondary | ICD-10-CM | POA: Diagnosis not present

## 2020-06-15 DIAGNOSIS — I10 Essential (primary) hypertension: Secondary | ICD-10-CM

## 2020-06-15 DIAGNOSIS — E113312 Type 2 diabetes mellitus with moderate nonproliferative diabetic retinopathy with macular edema, left eye: Secondary | ICD-10-CM | POA: Diagnosis not present

## 2020-07-07 DIAGNOSIS — I5032 Chronic diastolic (congestive) heart failure: Secondary | ICD-10-CM | POA: Diagnosis not present

## 2020-07-07 DIAGNOSIS — E1151 Type 2 diabetes mellitus with diabetic peripheral angiopathy without gangrene: Secondary | ICD-10-CM | POA: Diagnosis not present

## 2020-07-07 DIAGNOSIS — M5412 Radiculopathy, cervical region: Secondary | ICD-10-CM | POA: Diagnosis not present

## 2020-07-07 DIAGNOSIS — M5030 Other cervical disc degeneration, unspecified cervical region: Secondary | ICD-10-CM | POA: Diagnosis not present

## 2020-07-07 DIAGNOSIS — Z794 Long term (current) use of insulin: Secondary | ICD-10-CM | POA: Diagnosis not present

## 2020-07-07 DIAGNOSIS — Z6841 Body Mass Index (BMI) 40.0 and over, adult: Secondary | ICD-10-CM | POA: Diagnosis not present

## 2020-07-07 DIAGNOSIS — J449 Chronic obstructive pulmonary disease, unspecified: Secondary | ICD-10-CM | POA: Diagnosis not present

## 2020-07-07 DIAGNOSIS — Z008 Encounter for other general examination: Secondary | ICD-10-CM | POA: Diagnosis not present

## 2020-07-07 DIAGNOSIS — I110 Hypertensive heart disease with heart failure: Secondary | ICD-10-CM | POA: Diagnosis not present

## 2020-07-13 ENCOUNTER — Other Ambulatory Visit: Payer: Self-pay

## 2020-07-13 ENCOUNTER — Encounter (INDEPENDENT_AMBULATORY_CARE_PROVIDER_SITE_OTHER): Payer: PPO | Admitting: Ophthalmology

## 2020-07-13 DIAGNOSIS — H43813 Vitreous degeneration, bilateral: Secondary | ICD-10-CM

## 2020-07-13 DIAGNOSIS — E11311 Type 2 diabetes mellitus with unspecified diabetic retinopathy with macular edema: Secondary | ICD-10-CM

## 2020-07-13 DIAGNOSIS — I10 Essential (primary) hypertension: Secondary | ICD-10-CM | POA: Diagnosis not present

## 2020-07-13 DIAGNOSIS — E113291 Type 2 diabetes mellitus with mild nonproliferative diabetic retinopathy without macular edema, right eye: Secondary | ICD-10-CM

## 2020-07-13 DIAGNOSIS — E113312 Type 2 diabetes mellitus with moderate nonproliferative diabetic retinopathy with macular edema, left eye: Secondary | ICD-10-CM | POA: Diagnosis not present

## 2020-07-13 DIAGNOSIS — H35033 Hypertensive retinopathy, bilateral: Secondary | ICD-10-CM

## 2020-08-10 ENCOUNTER — Encounter (INDEPENDENT_AMBULATORY_CARE_PROVIDER_SITE_OTHER): Payer: PPO | Admitting: Ophthalmology

## 2020-08-27 ENCOUNTER — Other Ambulatory Visit: Payer: Self-pay | Admitting: Neurosurgery

## 2020-09-09 ENCOUNTER — Other Ambulatory Visit: Payer: Self-pay

## 2020-09-09 ENCOUNTER — Encounter (INDEPENDENT_AMBULATORY_CARE_PROVIDER_SITE_OTHER): Payer: Medicare Other | Admitting: Ophthalmology

## 2020-09-09 DIAGNOSIS — E113312 Type 2 diabetes mellitus with moderate nonproliferative diabetic retinopathy with macular edema, left eye: Secondary | ICD-10-CM

## 2020-09-09 DIAGNOSIS — I10 Essential (primary) hypertension: Secondary | ICD-10-CM | POA: Diagnosis not present

## 2020-09-09 DIAGNOSIS — H35033 Hypertensive retinopathy, bilateral: Secondary | ICD-10-CM

## 2020-09-09 DIAGNOSIS — E113391 Type 2 diabetes mellitus with moderate nonproliferative diabetic retinopathy without macular edema, right eye: Secondary | ICD-10-CM | POA: Diagnosis not present

## 2020-09-09 DIAGNOSIS — H43813 Vitreous degeneration, bilateral: Secondary | ICD-10-CM

## 2020-09-10 ENCOUNTER — Other Ambulatory Visit: Payer: Self-pay | Admitting: Neurosurgery

## 2020-09-14 NOTE — Progress Notes (Signed)
Surgical Instructions    Your procedure is scheduled on Thursday, February 17th.  Report to [facility] Main Entrance "A" at 5:30 A.M., then check in with the Admitting office.  Call this number if you have problems the morning of surgery:  [phone]   If you have any questions prior to your surgery date call [phone]: Open Monday-Friday 8am-4pm    Remember:  Do not eat or drink after midnight the night before your surgery   Take these medicines the morning of surgery with A SIP OF WATER   Albuterol inhaler - if needed (bring with you on day of surgery)  Astelin nasal spray - if needed  Eye drops - if needed  Diltiazem (Cardizem)  Trelegy Ellipta inhaler - If needed  Gabapentin (Neurontin)  Isosorbide Mononitrate (Imdur)  Nebivolol   Rosuvastatin (Crestor)   Follow your surgeon's instructions on when to stop Aspirin.  If no instructions were given by your surgeon then you will need to call the office to get those instructions.    As of today, STOP taking any Aspirin (unless otherwise instructed by your surgeon) Aleve, Naproxen, Ibuprofen, Motrin, Advil, Goody's, BC's, all herbal medications, fish oil, and all vitamins.   WHAT DO I DO ABOUT MY DIABETES MEDICATION?   Marland Kitchen Do not take oral diabetes medicines (pills) the morning of surgery. - Glipizide (Glucotrol), Sitagliptin (Januvia)  . THE NIGHT BEFORE SURGERY, take half of regular dose (40 units) of [place]     . THE MORNING OF SURGERY, take half of regular dose (40 units) [place]  . The day of surgery, do not take other diabetes injectables, including Byetta (exenatide), Bydureon (exenatide ER), Victoza (liraglutide), or Trulicity (dulaglutide).  . If your CBG is greater than 220 mg/dL, you may take  of your sliding scale (correction) dose of insulin.   HOW TO MANAGE YOUR DIABETES BEFORE AND AFTER SURGERY  Why is it important to control my blood sugar before and after surgery? . Improving blood sugar levels before  and after surgery helps healing and can limit problems. . A way of improving blood sugar control is eating a healthy diet by: o  Eating less sugar and carbohydrates o  Increasing activity/exercise o  Talking with your doctor about reaching your blood sugar goals . High blood sugars (greater than 180 mg/dL) can raise your risk of infections and slow your recovery, so you will need to focus on controlling your diabetes during the weeks before surgery. . Make sure that the doctor who takes care of your diabetes knows about your planned surgery including the date and location.  How do I manage my blood sugar before surgery? . Check your blood sugar at least 4 times a day, starting 2 days before surgery, to make sure that the level is not too high or low. . Check your blood sugar the morning of your surgery when you wake up and every 2 hours until you get to the Short Stay unit. o If your blood sugar is less than 70 mg/dL, you will need to treat for low blood sugar: - Do not take insulin. - Treat a low blood sugar (less than 70 mg/dL) with  cup of clear juice (cranberry or apple), 4 glucose tablets, OR glucose gel. - Recheck blood sugar in 15 minutes after treatment (to make sure it is greater than 70 mg/dL). If your blood sugar is not greater than 70 mg/dL on recheck, call [phone] for further instructions. . Report your blood sugar to the  short stay nurse when you get to Short Stay.  . If you are admitted to the hospital after surgery: o Your blood sugar will be checked by the staff and you will probably be given insulin after surgery (instead of oral diabetes medicines) to make sure you have good blood sugar levels. o The goal for blood sugar control after surgery is 80-180 mg/dL.               DAY OF SURGERY:                    Do not wear jewelry            Do not wear lotions, powders, colognes, or deodorant.            Men may shave face and neck.            Do not bring valuables  to the hospital.            [facility] is not responsible for any belongings or valuables.  Do NOT Smoke (Tobacco/Vaping) or drink Alcohol 24 hours prior to your procedure If you use a CPAP at night, you may bring all equipment for your overnight stay.   Contacts, glasses, dentures or bridgework may not be worn into surgery, please bring cases for these belongings   For patients admitted to the hospital, discharge time will be determined by your treatment team.   Patients discharged the day of surgery will not be allowed to drive home, and someone needs to stay with them for 24 hours.    Special instructions:   [facility]- Preparing For Surgery  Before surgery, you can play an important role. Because skin is not sterile, your skin needs to be as free of germs as possible. You can reduce the number of germs on your skin by washing with CHG (chlorahexidine gluconate) Soap before surgery.  CHG is an antiseptic cleaner which kills germs and bonds with the skin to continue killing germs even after washing.    Oral Hygiene is also important to reduce your risk of infection.  Remember - BRUSH YOUR TEETH THE MORNING OF SURGERY WITH YOUR REGULAR TOOTHPASTE  Please do not use if you have an allergy to CHG or antibacterial soaps. If your skin becomes reddened/irritated stop using the CHG.  Do not shave (including legs and underarms) for at least 48 hours prior to first CHG shower. It is OK to shave your face.  Please follow these instructions carefully.   1. Shower the NIGHT BEFORE SURGERY and the MORNING OF SURGERY  2. If you chose to wash your hair, wash your hair first as usual with your normal shampoo.  3. After you shampoo, rinse your hair and body thoroughly to remove the shampoo.  4. Wash Face and genitals (private parts) with your normal soap.   5.  Shower the NIGHT BEFORE SURGERY and the MORNING OF SURGERY with CHG Soap.   6. Use CHG Soap as you would any other liquid soap. You  can apply CHG directly to the skin and wash gently with a scrungie or a clean washcloth.   7. Apply the CHG Soap to your body ONLY FROM THE NECK DOWN.  Do not use on open wounds or open sores. Avoid contact with your eyes, ears, mouth and genitals (private parts). Wash Face and genitals (private parts)  with your normal soap.   8. Wash thoroughly, paying special attention to the area where your  surgery will be performed.  9. Thoroughly rinse your body with warm water from the neck down.  10. DO NOT shower/wash with your normal soap after using and rinsing off the CHG Soap.  11. Pat yourself dry with a CLEAN TOWEL.  12. Wear CLEAN PAJAMAS to bed the night before surgery  13. Place CLEAN SHEETS on your bed the night before your surgery  14. DO NOT SLEEP WITH PETS.   Day of Surgery: Wear Clean/Comfortable clothing the morning of surgery Do not apply any deodorants/lotions.   Remember to brush your teeth WITH YOUR REGULAR TOOTHPASTE.   Please read over the following fact sheets that you were given.

## 2020-09-15 ENCOUNTER — Other Ambulatory Visit (HOSPITAL_COMMUNITY)
Admission: RE | Admit: 2020-09-15 | Discharge: 2020-09-15 | Disposition: A | Discharge status: Home / Self Care | Payer: Medicare Other | Source: Ambulatory Visit | Attending: Neurosurgery | Admitting: Neurosurgery

## 2020-09-15 ENCOUNTER — Encounter (HOSPITAL_COMMUNITY): Payer: Self-pay

## 2020-09-15 ENCOUNTER — Other Ambulatory Visit: Payer: Self-pay

## 2020-09-15 ENCOUNTER — Encounter (HOSPITAL_COMMUNITY)
Admission: RE | Admit: 2020-09-15 | Discharge: 2020-09-15 | Disposition: A | Discharge status: Home / Self Care | Payer: Medicare Other | Source: Ambulatory Visit | Attending: Neurosurgery | Admitting: Neurosurgery

## 2020-09-15 DIAGNOSIS — Z01812 Encounter for preprocedural laboratory examination: Secondary | ICD-10-CM | POA: Insufficient documentation

## 2020-09-15 DIAGNOSIS — Z20822 Contact with and (suspected) exposure to covid-19: Secondary | ICD-10-CM | POA: Insufficient documentation

## 2020-09-15 HISTORY — DX: Sleep apnea, unspecified: G47.30

## 2020-09-15 LAB — BASIC METABOLIC PANEL
Anion gap: 10 (ref 5–15)
BUN: 13 mg/dL (ref 8–23)
CO2: 26 mmol/L (ref 22–32)
Calcium: 9.4 mg/dL (ref 8.9–10.3)
Chloride: 101 mmol/L (ref 98–111)
Creatinine, Ser: 1.2 mg/dL (ref 0.61–1.24)
GFR, Estimated: >60 mL/min (ref >60)
Glucose, Bld: 146 mg/dL — ABNORMAL HIGH (ref 70–99)
Potassium: 3.5 mmol/L (ref 3.5–5.1)
Sodium: 137 mmol/L (ref 135–145)

## 2020-09-15 LAB — TYPE AND SCREEN
ABO/RH(D): B POS
Antibody Screen: NEGATIVE

## 2020-09-15 LAB — CBC
HCT: 49.5 % (ref 39.0–52.0)
Hemoglobin: 15.6 g/dL (ref 13.0–17.0)
MCH: 27.3 pg (ref 26.0–34.0)
MCHC: 31.5 g/dL (ref 30.0–36.0)
MCV: 86.5 fL (ref 80.0–100.0)
Platelets: 134 10*3/uL — ABNORMAL LOW (ref 150–400)
RBC: 5.72 MIL/uL (ref 4.22–5.81)
RDW: 14.5 % (ref 11.5–15.5)
WBC: 6.6 10*3/uL (ref 4.0–10.5)
nRBC: 0 % (ref 0.0–0.2)

## 2020-09-15 LAB — SURGICAL PCR SCREEN
MRSA, PCR: NEGATIVE
Staphylococcus aureus: NEGATIVE

## 2020-09-15 LAB — SARS CORONAVIRUS 2 (TAT 6-24 HRS): SARS Coronavirus 2: NEGATIVE

## 2020-09-15 LAB — GLUCOSE, CAPILLARY: Glucose-Capillary: 148 mg/dL — ABNORMAL HIGH (ref 70–99)

## 2020-09-15 LAB — HEMOGLOBIN A1C
Hgb A1c MFr Bld: 7.7 % — ABNORMAL HIGH (ref 4.8–5.6)
Mean Plasma Glucose: 174 mg/dL

## 2020-09-15 NOTE — Progress Notes (Addendum)
Anesthesia Chart Review:  Case: [id number] Date/Time: 09/17/20 0715   Procedure: ACDF E26,S34,H96 (N/A ) - 3C   Anesthesia type: General   Pre-op diagnosis: STENOSIS OF CERVICAL SPINE WITH MYELOPATHY   Location: [facility] OR ROOM 20 / [facility]   Surgeons: Vallarie Mare, MD      DISCUSSION: Patient is a 70 year old male scheduled for the above procedure. Surgery was initially planned for the Fall 2021, but I believe was delayed to get DM better controlled and as due to the COVID-19 pandemic.   History includes never smoker, HTN, DM2, CAD (30% mLAD, 30-40% mRCA, EF 40-45% 12/27/17 cath), COPD, asthma ("mild persistent", well controlled as of 01/16/20), chronic dyspnea, chronic combined systolic and diastolic CHF, dysphagia, BPH, back surgery (right L4-5 laminectomy/foraminotomy 10/07/15). BMI is consistent with morbid obesity.  - Letter of medical clearance signed by PCP Sonia Side., FNP classifying him as "Moderate Risk". A1c as of 07/07/20 was 7.3%. Repeat with PAT labs up to 7.7%.   - Last visit with pulmonologist Dr. Carlis Abbott on 01/16/20. She was aware of surgery plans. Asthma felt controlled at that time.  - Last visit with cardiologist Dr. Irish Lack 11/11/19. He wrote, "Preop eval: Sx under control. Recent cath in 2019.  No further cardiac testing neded before surgery.  Avoid excess IV fluids given his mildly decreased LVEF." ASA on hold for surgery.   09/15/20 presurgical COVID-19 test was negative. Anesthesia team to evaluate on the day of surgery.    VS: BP 139/60   Pulse 72   Temp 36.7 C (Oral)   Resp 19   Ht 5' 6.5" (1.689 m)   Wt 128.1 kg   SpO2 95%   BMI 44.88 kg/m    PROVIDERS: Sonia Side., FNP is PCP  - Larae Grooms, MD is cardiologist. Seen 05/14/20 by Fuller Canada, RPH-CPP at that [facility]--home readings at goal (< 130/80) so as needed follow-up at the [facility] recommended. Noemi Chapel, DO is pulmonologist   LABS: Labs reviewed: Acceptable for surgery. A1c  in process.  (all labs ordered are listed, but only abnormal results are displayed)  Labs Reviewed  GLUCOSE, CAPILLARY - Abnormal; Notable for the following components:      Result Value   Glucose-Capillary 148 (*)    All other components within normal limits  BASIC METABOLIC PANEL - Abnormal; Notable for the following components:   Glucose, Bld 146 (*)    All other components within normal limits  CBC - Abnormal; Notable for the following components:   Platelets 134 (*)    All other components within normal limits  SURGICAL PCR SCREEN  HEMOGLOBIN A1C  TYPE AND SCREEN    OTHER: PFTs 01/16/20: FVC 1.87 (58%), post 1.89 (59%). FEV1 1.39 (57%), post 1.53 (63%). DLCO unc 26.07 (114%), cor 26.07 (114%).   Sleep Study 08/21/17: IMPRESSIONS - Severe obstructive sleep apnea is documented. AutoPAP 10-20 is recommended.  - Absent slow wave and REM sleep are noted.    IMAGES: MRI C-spine 09/18/19: IMPRESSION: 1. Multilevel degenerative disease with reversal of cervical lordosis and multilevel listhesis. 2. Spinal stenosis with cord compression from C4-5 to C6-7, severe at C5-6 where there is cord signal abnormality. 3. High-grade foraminal impingement at C4-5 to C6-7 bilaterally and at C7-T1 on the right.   EKG: 11/11/19: NSR   CV: Cardiac cath 12/27/17 (done for intermediate risk stress test):  Widely patent coronary arteries with normal left main, 30% mid LAD, luminal irregularities  in the proximal and mid circumflex, and 30 to 40% stenosis in the mid RCA.  Mild global left ventricular systolic dysfunction with EF 40 to 45%.  Severely elevated end-diastolic pressure consistent with chronic combined systolic and diastolic heart failure.  Severe poorly controlled blood pressure during the procedure.  Severe BP elevation during the procedure requiring IV hydralazine, labetalol, and IV nitroglycerin drip.  Stable right femoral after Mynx arteriotomy closure  device.  RECOMMENDATIONS:  IV Lasix x1 and more aggressive outpatient diuresis  Guideline directed therapy for systolic/diastolic heart failure.   Nuclear stress tet 11/03/17:  No diagnostic ST segment changes to indicate ischemia. No chest pain reported. Hypertensive response noted. Low risk Duke treadmill score of 5.  Small, mild intensity, apical anteroseptal defect that is reversible and consistent with a small ischemic territory.  Medium sized, moderate intensity, inferior defect that is fixed at the base and partially reversible in the mid to apical segment. This is consistent with scar and mild peri-infarct ischemia.  Blood pressure demonstrated a hypertensive response to exercise.  This is an intermediate risk study.  Nuclear stress EF: 44%. Consider echocardiogram for further assessment of LVEF.   Echo 09/18/17: Study Conclusions  - Left ventricle: The cavity size was mildly dilated. Wall  thickness was increased in a pattern of severe LVH. Systolic  function was normal. The estimated ejection fraction was in the  range of 60% to 65%.   Impressions:  - LVEF is normal Cannot evaluate regional wall motion fully as  endocardium is not well seen. Consider limited echo with Definity  to define wall motion    Past Medical History:  Diagnosis Date  . Abnormal nuclear cardiac imaging test 12/27/2017  . Acute on chronic diastolic CHF (congestive heart failure) ([place]) 09/17/2017  . Acute respiratory failure with hypoxia ([place]) 06/01/2017  . Arthritis   . Asthma   . CAD (coronary artery disease)    a. 11/2017: cath showing widely patent coronary arteries with normal left main, 30% mid LAD, luminal irregularities in the proximal and mid circumflex, and 30 to 40% stenosis in the mid RCA.  Marland Kitchen CAP (community acquired pneumonia) 07/31/2014  . Community acquired pneumonia 07/31/2014  . Constipation 02/06/2018  . COPD exacerbation ([place]) 09/17/2017  . Dysphagia   . Elevated  troponin 09/17/2017  . Enlarged prostate   . Grade II diastolic dysfunction   . Hypertension   . Lumbar stenosis 10/07/2015  . Obesity 07/31/2014  . PNA (pneumonia) 05/30/2017  . Pneumonia   . Pneumonia   . Polyp of rectum   . Respiratory failure ([place]) 09/18/2017  . SBO (small bowel obstruction) ([place]) 05/08/2019  . Shortness of breath   . Type 2 diabetes mellitus ([place])     Past Surgical History:  Procedure Laterality Date  . CIRCUMCISION    . COLONOSCOPY WITH PROPOFOL N/A 08/08/2017   Procedure: COLONOSCOPY WITH PROPOFOL;  Surgeon: Danie Binder, MD;  Location: [facility] SU[facility];  Service: Endoscopy;  Laterality: N/A;  1:00pm  . ESOPHAGEAL DILATION N/A 06/07/2017   Procedure: ESOPHAGEAL DILATION;  Surgeon: Daneil Dolin, MD;  Location: [facility] SU[facility];  Service: Endoscopy;  Laterality: N/A;  . ESOPHAGOGASTRODUODENOSCOPY (EGD) WITH PROPOFOL N/A 06/07/2017   Procedure: ESOPHAGOGASTRODUODENOSCOPY (EGD) WITH PROPOFOL;  Surgeon: Daneil Dolin, MD;  Location: [facility];  Service: Endoscopy;  Laterality: N/A;  . HYDROCELE EXCISION  10/14/2011   Procedure: HYDROCELECTOMY ADULT;  Surgeon: Malka So, MD;  Location: [facility] ORS;  Service: Urology;  Laterality:  Right;  Marland Kitchen LACERATION REPAIR     left hand  . LUMBAR LAMINECTOMY/DECOMPRESSION MICRODISCECTOMY Right 10/07/2015   Procedure: Right Lumbar four-five ,lumbar five sacral-one Laminectomy;  Surgeon: Leeroy Cha, MD;  Location: [facility] NEURO ORS;  Service: Neurosurgery;  Laterality: Right;  . RIGHT/LEFT HEART CATH AND CORONARY ANGIOGRAPHY N/A 12/27/2017   Procedure: RIGHT/LEFT HEART CATH AND CORONARY ANGIOGRAPHY;  Surgeon: Belva Crome, MD;  Location: [facility] CV LAB;  Service: Cardiovascular;  Laterality: N/A;    MEDICATIONS: . albuterol (VENTOLIN HFA) 108 (90 Base) MCG/ACT inhaler  . aspirin EC 81 MG tablet  . Aspirin-Caffeine (BC FAST PAIN RELIEF) 845-65 MG PACK  . azelastine (ASTELIN) 0.1 % nasal spray  . azelastine (OPTIVAR) 0.05 %  ophthalmic solution  . diltiazem (CARDIZEM CD) 120 MG 24 hr capsule  . Fluticasone-Umeclidin-Vilant (TRELEGY ELLIPTA) 100-62.5-25 MCG/INH AEPB  . furosemide (LASIX) 40 MG tablet  . gabapentin (NEURONTIN) 300 MG capsule  . glipiZIDE (GLUCOTROL) 10 MG tablet  . hydrALAZINE (APRESOLINE) 100 MG tablet  . isosorbide mononitrate (IMDUR) 30 MG 24 hr tablet  . Multiple Vitamin (MULTIVITAMIN WITH MINERALS) TABS tablet  . Nebivolol HCl 20 MG TABS  . olmesartan-hydrochlorothiazide (BENICAR HCT) 40-25 MG tablet  . polyethylene glycol (MIRALAX / GLYCOLAX) 17 g packet  . rosuvastatin (CRESTOR) 40 MG tablet  . sitaGLIPtin (JANUVIA) 100 MG tablet  . spironolactone (ALDACTONE) 25 MG tablet  . tamsulosin (FLOMAX) 0.4 MG CAPS capsule  . TRESIBA FLEXTOUCH 100 UNIT/ML FlexTouch Pen  . TRULICITY 1.5 NP/0.0FR SOPN   No current facility-administered medications for this encounter.    Myra Gianotti, PA-C Surgical Short Stay/Anesthesiology [facility] Phone [phone] [facility] Phone [phone] 09/16/2020 9:25 AM

## 2020-09-15 NOTE — Progress Notes (Signed)
PCP - [place]  Chest x-ray - na/ EKG - 11/11/19 ECHO - 2019 Cardiac Cath - 2019   DM - Type 2 Fasting Blood Sugar - 90s-120  Aspirin Instructions: Per patient, already stopped ASA  COVID TEST- 09/15/20   Anesthesia review: yes, heart history  Patient denies fever, cough and chest pain at PAT appointment Patient has SOB, not a new finding.  Patient sees Pulmonologist.   All instructions explained to the patient, with a verbal understanding of the material. Patient agrees to go over the instructions while at home for a better understanding. Patient also instructed to self quarantine after being tested for COVID-19. The opportunity to ask questions was provided.

## 2020-09-16 MED ORDER — DEXTROSE 5 % IV SOLN
3.0000 g | INTRAVENOUS | Status: DC
  Filled 2020-09-16: qty 3000

## 2020-09-16 NOTE — Anesthesia Preprocedure Evaluation (Addendum)
Anesthesia Evaluation  Patient identified by MRN, date of birth, ID band Patient awake    Reviewed: Allergy & Precautions, H&P , NPO status , Patient's Chart, lab work & pertinent test results  Airway Mallampati: III   Neck ROM: limited    Dental   Pulmonary shortness of breath, asthma , sleep apnea , COPD,    breath sounds clear to auscultation       Cardiovascular hypertension, +CHF   Rhythm:regular Rate:Normal     Neuro/Psych    GI/Hepatic   Endo/Other  diabetes, Type 2Morbid obesity  Renal/GU      Musculoskeletal  (+) Arthritis ,   Abdominal   Peds  Hematology   Anesthesia Other Findings   Reproductive/Obstetrics                            Anesthesia Physical Anesthesia Plan  ASA: III  Anesthesia Plan: General   Post-op Pain Management:    Induction: Intravenous  PONV Risk Score and Plan: 2 and Ondansetron, Dexamethasone, Midazolam and Treatment may vary due to age or medical condition  Airway Management Planned: Oral ETT and Video Laryngoscope Planned  Additional Equipment:   Intra-op Plan:   Post-operative Plan: Extubation in OR  Informed Consent: I have reviewed the patients History and Physical, chart, labs and discussed the procedure including the risks, benefits and alternatives for the proposed anesthesia with the patient or authorized representative who has indicated his/her understanding and acceptance.     Dental advisory given  Plan Discussed with: CRNA, Anesthesiologist and Surgeon  Anesthesia Plan Comments: (PAT note written 09/16/2020 by Myra Gianotti, PA-C. )       Anesthesia Quick Evaluation

## 2020-09-17 ENCOUNTER — Encounter (HOSPITAL_COMMUNITY)
Admission: RE | Disposition: A | Discharge status: Home / Self Care | Payer: Self-pay | Source: Home / Self Care | Attending: Neurosurgery

## 2020-09-17 ENCOUNTER — Other Ambulatory Visit: Payer: Self-pay

## 2020-09-17 ENCOUNTER — Inpatient Hospital Stay (HOSPITAL_COMMUNITY)
Admission: RE | Admit: 2020-09-17 | Discharge: 2020-09-20 | DRG: 472 | Disposition: A | Discharge status: Home / Self Care | Payer: Medicare Other | Attending: Neurosurgery | Admitting: Neurosurgery

## 2020-09-17 ENCOUNTER — Encounter (HOSPITAL_COMMUNITY): Payer: Self-pay

## 2020-09-17 ENCOUNTER — Inpatient Hospital Stay (HOSPITAL_COMMUNITY): Payer: Medicare Other

## 2020-09-17 ENCOUNTER — Inpatient Hospital Stay (HOSPITAL_COMMUNITY): Payer: Medicare Other | Admitting: Certified Registered"

## 2020-09-17 ENCOUNTER — Inpatient Hospital Stay (HOSPITAL_COMMUNITY): Payer: Medicare Other | Admitting: Vascular Surgery

## 2020-09-17 DIAGNOSIS — G992 Myelopathy in diseases classified elsewhere: Secondary | ICD-10-CM | POA: Diagnosis present

## 2020-09-17 DIAGNOSIS — Z6841 Body Mass Index (BMI) 40.0 and over, adult: Secondary | ICD-10-CM

## 2020-09-17 DIAGNOSIS — Z419 Encounter for procedure for purposes other than remedying health state, unspecified: Secondary | ICD-10-CM

## 2020-09-17 DIAGNOSIS — J441 Chronic obstructive pulmonary disease with (acute) exacerbation: Secondary | ICD-10-CM

## 2020-09-17 DIAGNOSIS — Z8701 Personal history of pneumonia (recurrent): Secondary | ICD-10-CM | POA: Diagnosis not present

## 2020-09-17 DIAGNOSIS — F1411 Cocaine abuse, in remission: Secondary | ICD-10-CM | POA: Diagnosis present

## 2020-09-17 DIAGNOSIS — E1165 Type 2 diabetes mellitus with hyperglycemia: Secondary | ICD-10-CM | POA: Diagnosis not present

## 2020-09-17 DIAGNOSIS — J449 Chronic obstructive pulmonary disease, unspecified: Secondary | ICD-10-CM | POA: Diagnosis present

## 2020-09-17 DIAGNOSIS — M4852XA Collapsed vertebra, not elsewhere classified, cervical region, initial encounter for fracture: Secondary | ICD-10-CM | POA: Diagnosis not present

## 2020-09-17 DIAGNOSIS — Z79899 Other long term (current) drug therapy: Secondary | ICD-10-CM

## 2020-09-17 DIAGNOSIS — Z7951 Long term (current) use of inhaled steroids: Secondary | ICD-10-CM

## 2020-09-17 DIAGNOSIS — R739 Hyperglycemia, unspecified: Secondary | ICD-10-CM | POA: Diagnosis not present

## 2020-09-17 DIAGNOSIS — M40202 Unspecified kyphosis, cervical region: Secondary | ICD-10-CM | POA: Diagnosis present

## 2020-09-17 DIAGNOSIS — G9589 Other specified diseases of spinal cord: Secondary | ICD-10-CM | POA: Diagnosis present

## 2020-09-17 DIAGNOSIS — M4802 Spinal stenosis, cervical region: Principal | ICD-10-CM | POA: Diagnosis present

## 2020-09-17 DIAGNOSIS — Z7982 Long term (current) use of aspirin: Secondary | ICD-10-CM | POA: Diagnosis not present

## 2020-09-17 DIAGNOSIS — M2578 Osteophyte, vertebrae: Secondary | ICD-10-CM | POA: Diagnosis not present

## 2020-09-17 DIAGNOSIS — I10 Essential (primary) hypertension: Secondary | ICD-10-CM | POA: Diagnosis not present

## 2020-09-17 DIAGNOSIS — Z20822 Contact with and (suspected) exposure to covid-19: Secondary | ICD-10-CM | POA: Diagnosis not present

## 2020-09-17 DIAGNOSIS — Z833 Family history of diabetes mellitus: Secondary | ICD-10-CM

## 2020-09-17 DIAGNOSIS — I5042 Chronic combined systolic (congestive) and diastolic (congestive) heart failure: Secondary | ICD-10-CM | POA: Diagnosis present

## 2020-09-17 DIAGNOSIS — J4530 Mild persistent asthma, uncomplicated: Secondary | ICD-10-CM | POA: Diagnosis present

## 2020-09-17 DIAGNOSIS — I973 Postprocedural hypertension: Secondary | ICD-10-CM | POA: Diagnosis not present

## 2020-09-17 DIAGNOSIS — N400 Enlarged prostate without lower urinary tract symptoms: Secondary | ICD-10-CM | POA: Diagnosis present

## 2020-09-17 DIAGNOSIS — Z9119 Patient's noncompliance with other medical treatment and regimen: Secondary | ICD-10-CM | POA: Diagnosis not present

## 2020-09-17 DIAGNOSIS — I2510 Atherosclerotic heart disease of native coronary artery without angina pectoris: Secondary | ICD-10-CM | POA: Diagnosis present

## 2020-09-17 DIAGNOSIS — G4733 Obstructive sleep apnea (adult) (pediatric): Secondary | ICD-10-CM | POA: Diagnosis present

## 2020-09-17 DIAGNOSIS — Z8249 Family history of ischemic heart disease and other diseases of the circulatory system: Secondary | ICD-10-CM

## 2020-09-17 DIAGNOSIS — Z888 Allergy status to other drugs, medicaments and biological substances status: Secondary | ICD-10-CM

## 2020-09-17 DIAGNOSIS — I110 Hypertensive heart disease with heart failure: Secondary | ICD-10-CM | POA: Diagnosis present

## 2020-09-17 HISTORY — PX: ANTERIOR CERVICAL DECOMP/DISCECTOMY FUSION: SHX1161

## 2020-09-17 LAB — POCT I-STAT 7, (LYTES, BLD GAS, ICA,H+H)
Acid-Base Excess: 2 mmol/L (ref 0.0–2.0)
Bicarbonate: 28.3 mmol/L — ABNORMAL HIGH (ref 20.0–28.0)
Calcium, Ion: 1.32 mmol/L (ref 1.15–1.40)
HCT: 50 % (ref 39.0–52.0)
Hemoglobin: 17 g/dL (ref 13.0–17.0)
O2 Saturation: 93 %
Potassium: 3.5 mmol/L (ref 3.5–5.1)
Sodium: 144 mmol/L (ref 135–145)
TCO2: 30 mmol/L (ref 22–32)
pCO2 arterial: 49.2 mmHg — ABNORMAL HIGH (ref 32.0–48.0)
pH, Arterial: 7.368 (ref 7.350–7.450)
pO2, Arterial: 69 mmHg — ABNORMAL LOW (ref 83.0–108.0)

## 2020-09-17 LAB — BASIC METABOLIC PANEL
Anion gap: 10 (ref 5–15)
BUN: 16 mg/dL (ref 8–23)
CO2: 27 mmol/L (ref 22–32)
Calcium: 9.2 mg/dL (ref 8.9–10.3)
Chloride: 105 mmol/L (ref 98–111)
Creatinine, Ser: 1.38 mg/dL — ABNORMAL HIGH (ref 0.61–1.24)
GFR, Estimated: 56 mL/min — ABNORMAL LOW (ref >60)
Glucose, Bld: 93 mg/dL (ref 70–99)
Potassium: 3.5 mmol/L (ref 3.5–5.1)
Sodium: 142 mmol/L (ref 135–145)

## 2020-09-17 LAB — CBC
HCT: 51 % (ref 39.0–52.0)
Hemoglobin: 16.1 g/dL (ref 13.0–17.0)
MCH: 27 pg (ref 26.0–34.0)
MCHC: 31.6 g/dL (ref 30.0–36.0)
MCV: 85.6 fL (ref 80.0–100.0)
Platelets: 121 10*3/uL — ABNORMAL LOW (ref 150–400)
RBC: 5.96 MIL/uL — ABNORMAL HIGH (ref 4.22–5.81)
RDW: 14.6 % (ref 11.5–15.5)
WBC: 9.8 10*3/uL (ref 4.0–10.5)
nRBC: 0 % (ref 0.0–0.2)

## 2020-09-17 LAB — GLUCOSE, CAPILLARY
Glucose-Capillary: 92 mg/dL (ref 70–99)
Glucose-Capillary: 76 mg/dL (ref 70–99)
Glucose-Capillary: 83 mg/dL (ref 70–99)

## 2020-09-17 LAB — ABO/RH: ABO/RH(D): B POS

## 2020-09-17 SURGERY — ANTERIOR CERVICAL DECOMPRESSION/DISCECTOMY FUSION 3 LEVELS
Anesthesia: General | Site: Neck

## 2020-09-17 MED ORDER — PROPOFOL 10 MG/ML IV BOLUS
INTRAVENOUS | Status: AC
  Filled 2020-09-17: qty 20

## 2020-09-17 MED ORDER — CHLORHEXIDINE GLUCONATE CLOTH 2 % EX PADS
6.0000 | MEDICATED_PAD | Freq: Every day | CUTANEOUS | Status: DC
  Administered 2020-09-17 – 2020-09-20 (×3): 6 via TOPICAL

## 2020-09-17 MED ORDER — PROPOFOL 10 MG/ML IV BOLUS
INTRAVENOUS | Status: DC | PRN
  Administered 2020-09-17: 200 mg via INTRAVENOUS

## 2020-09-17 MED ORDER — INSULIN GLARGINE 100 UNIT/ML ~~LOC~~ SOLN
80.0000 [IU] | Freq: Every day | SUBCUTANEOUS | Status: DC
  Administered 2020-09-17 – 2020-09-19 (×3): 80 [IU] via SUBCUTANEOUS
  Filled 2020-09-17 (×5): qty 0.8

## 2020-09-17 MED ORDER — PHENYLEPHRINE HCL-NACL 10-0.9 MG/250ML-% IV SOLN
INTRAVENOUS | Status: DC | PRN
  Administered 2020-09-17: 50 ug/min via INTRAVENOUS

## 2020-09-17 MED ORDER — SUFENTANIL CITRATE 50 MCG/ML IV SOLN
INTRAVENOUS | Status: AC
  Filled 2020-09-17: qty 1

## 2020-09-17 MED ORDER — ENALAPRILAT 1.25 MG/ML IV SOLN: 1.2500 mg | Freq: Four times a day (QID) | INTRAVENOUS | Status: DC | PRN

## 2020-09-17 MED ORDER — GABAPENTIN 100 MG PO CAPS
100.0000 mg | ORAL_CAPSULE | Freq: Three times a day (TID) | ORAL | Status: DC
  Administered 2020-09-17 – 2020-09-20 (×8): 100 mg via ORAL
  Filled 2020-09-17 (×8): qty 1

## 2020-09-17 MED ORDER — IRBESARTAN 300 MG PO TABS
300.0000 mg | ORAL_TABLET | Freq: Every day | ORAL | Status: DC
  Administered 2020-09-18 – 2020-09-20 (×3): 300 mg via ORAL
  Filled 2020-09-17: qty 2
  Filled 2020-09-17: qty 1
  Filled 2020-09-17: qty 2

## 2020-09-17 MED ORDER — CLEVIDIPINE BUTYRATE 0.5 MG/ML IV EMUL
0.0000 mg/h | INTRAVENOUS | Status: DC
  Administered 2020-09-17: 6 mg/h via INTRAVENOUS

## 2020-09-17 MED ORDER — ALBUTEROL SULFATE HFA 108 (90 BASE) MCG/ACT IN AERS
2.0000 | INHALATION_SPRAY | Freq: Four times a day (QID) | RESPIRATORY_TRACT | Status: DC | PRN
  Filled 2020-09-17: qty 6.7

## 2020-09-17 MED ORDER — OLMESARTAN MEDOXOMIL-HCTZ 40-25 MG PO TABS
1.0000 | ORAL_TABLET | Freq: Every day | ORAL | Status: DC
Start: 2020-09-17 — End: 2020-09-17

## 2020-09-17 MED ORDER — DOCUSATE SODIUM 100 MG PO CAPS
100.0000 mg | ORAL_CAPSULE | Freq: Two times a day (BID) | ORAL | Status: DC
  Administered 2020-09-17 – 2020-09-20 (×6): 100 mg via ORAL
  Filled 2020-09-17 (×6): qty 1

## 2020-09-17 MED ORDER — SPIRONOLACTONE 12.5 MG HALF TABLET: 12.5000 mg | ORAL_TABLET | Freq: Every day | ORAL | Status: DC

## 2020-09-17 MED ORDER — THROMBIN 5000 UNITS EX SOLR: OROMUCOSAL | Status: DC | PRN

## 2020-09-17 MED ORDER — GLYCOPYRROLATE PF 0.2 MG/ML IJ SOSY
PREFILLED_SYRINGE | INTRAMUSCULAR | Status: AC
  Filled 2020-09-17: qty 1

## 2020-09-17 MED ORDER — ADULT MULTIVITAMIN W/MINERALS CH
1.0000 | ORAL_TABLET | Freq: Every day | ORAL | Status: DC
  Administered 2020-09-18 – 2020-09-20 (×3): 1 via ORAL
  Filled 2020-09-17 (×3): qty 1

## 2020-09-17 MED ORDER — THROMBIN 5000 UNITS EX SOLR
CUTANEOUS | Status: AC
  Filled 2020-09-17: qty 5000

## 2020-09-17 MED ORDER — ONDANSETRON HCL 4 MG/2ML IJ SOLN: 4.0000 mg | Freq: Four times a day (QID) | INTRAMUSCULAR | Status: DC | PRN

## 2020-09-17 MED ORDER — SUGAMMADEX SODIUM 200 MG/2ML IV SOLN
INTRAVENOUS | Status: DC | PRN
  Administered 2020-09-17: 200 mg via INTRAVENOUS

## 2020-09-17 MED ORDER — LIDOCAINE HCL (CARDIAC) PF 100 MG/5ML IV SOSY
PREFILLED_SYRINGE | INTRAVENOUS | Status: DC | PRN
  Administered 2020-09-17: 100 mg via INTRAVENOUS

## 2020-09-17 MED ORDER — FLEET ENEMA 7-19 GM/118ML RE ENEM
1.0000 | ENEMA | Freq: Once | RECTAL | Status: DC | PRN
  Filled 2020-09-17: qty 1

## 2020-09-17 MED ORDER — SODIUM CHLORIDE 0.9% FLUSH
3.0000 mL | Freq: Two times a day (BID) | INTRAVENOUS | Status: DC
  Administered 2020-09-17 – 2020-09-20 (×6): 3 mL via INTRAVENOUS

## 2020-09-17 MED ORDER — ONDANSETRON HCL 4 MG/2ML IJ SOLN
INTRAMUSCULAR | Status: AC
  Filled 2020-09-17: qty 2

## 2020-09-17 MED ORDER — CLEVIDIPINE BUTYRATE 0.5 MG/ML IV EMUL
INTRAVENOUS | Status: AC
  Filled 2020-09-17: qty 50

## 2020-09-17 MED ORDER — LABETALOL HCL 5 MG/ML IV SOLN
10.0000 mg | INTRAVENOUS | Status: DC | PRN
  Administered 2020-09-18 (×2): 10 mg via INTRAVENOUS
  Filled 2020-09-17 (×2): qty 4

## 2020-09-17 MED ORDER — OXYCODONE-ACETAMINOPHEN 5-325 MG PO TABS
1.0000 | ORAL_TABLET | Freq: Four times a day (QID) | ORAL | Status: DC | PRN
  Administered 2020-09-17: 2 via ORAL
  Administered 2020-09-18: 1 via ORAL
  Administered 2020-09-18 (×2): 2 via ORAL
  Administered 2020-09-19: 1 via ORAL
  Administered 2020-09-19 – 2020-09-20 (×5): 2 via ORAL
  Filled 2020-09-17: qty 2
  Filled 2020-09-17 (×2): qty 1
  Filled 2020-09-17 (×9): qty 2

## 2020-09-17 MED ORDER — PANTOPRAZOLE SODIUM 40 MG PO TBEC
40.0000 mg | DELAYED_RELEASE_TABLET | Freq: Every day | ORAL | Status: DC
  Administered 2020-09-18 – 2020-09-20 (×3): 40 mg via ORAL
  Filled 2020-09-17 (×3): qty 1

## 2020-09-17 MED ORDER — ROCURONIUM 10MG/ML (10ML) SYRINGE FOR MEDFUSION PUMP - OPTIME
INTRAVENOUS | Status: DC | PRN
  Administered 2020-09-17 (×2): 100 mg via INTRAVENOUS

## 2020-09-17 MED ORDER — MIDAZOLAM HCL 2 MG/2ML IJ SOLN
INTRAMUSCULAR | Status: AC
  Filled 2020-09-17: qty 2

## 2020-09-17 MED ORDER — LACTATED RINGERS IV SOLN: INTRAVENOUS | Status: DC | PRN

## 2020-09-17 MED ORDER — NEBIVOLOL HCL 10 MG PO TABS
20.0000 mg | ORAL_TABLET | Freq: Two times a day (BID) | ORAL | Status: DC
  Administered 2020-09-17 – 2020-09-20 (×6): 20 mg via ORAL
  Filled 2020-09-17 (×8): qty 2

## 2020-09-17 MED ORDER — LIDOCAINE-EPINEPHRINE 1 %-1:100000 IJ SOLN
INTRAMUSCULAR | Status: AC
  Filled 2020-09-17: qty 1

## 2020-09-17 MED ORDER — MORPHINE SULFATE (PF) 2 MG/ML IV SOLN
2.0000 mg | INTRAVENOUS | Status: DC | PRN
  Administered 2020-09-17 – 2020-09-18 (×2): 2 mg via INTRAVENOUS
  Filled 2020-09-17 (×2): qty 1

## 2020-09-17 MED ORDER — DEXAMETHASONE SODIUM PHOSPHATE 10 MG/ML IJ SOLN
INTRAMUSCULAR | Status: DC | PRN
  Administered 2020-09-17: 10 mg via INTRAVENOUS

## 2020-09-17 MED ORDER — PHENOL 1.4 % MT LIQD
1.0000 | OROMUCOSAL | Status: DC | PRN
  Administered 2020-09-17 – 2020-09-18 (×2): 1 via OROMUCOSAL
  Filled 2020-09-17: qty 177

## 2020-09-17 MED ORDER — ONDANSETRON HCL 4 MG PO TABS: 4.0000 mg | ORAL_TABLET | Freq: Four times a day (QID) | ORAL | Status: DC | PRN

## 2020-09-17 MED ORDER — FUROSEMIDE 40 MG PO TABS: 40.0000 mg | ORAL_TABLET | Freq: Every day | ORAL | Status: DC | PRN

## 2020-09-17 MED ORDER — MIDAZOLAM HCL 2 MG/2ML IJ SOLN
INTRAMUSCULAR | Status: DC | PRN
  Administered 2020-09-17: 2 mg via INTRAVENOUS

## 2020-09-17 MED ORDER — CHLORHEXIDINE GLUCONATE CLOTH 2 % EX PADS: 6.0000 | MEDICATED_PAD | Freq: Once | CUTANEOUS | Status: DC

## 2020-09-17 MED ORDER — SODIUM CHLORIDE (PF) 0.9 % IJ SOLN
INTRAMUSCULAR | Status: AC
  Filled 2020-09-17: qty 10

## 2020-09-17 MED ORDER — LABETALOL HCL 5 MG/ML IV SOLN
10.0000 mg | INTRAVENOUS | Status: AC | PRN
  Administered 2020-09-17 (×2): 10 mg via INTRAVENOUS

## 2020-09-17 MED ORDER — ROCURONIUM BROMIDE 10 MG/ML (PF) SYRINGE
PREFILLED_SYRINGE | INTRAVENOUS | Status: AC
  Filled 2020-09-17: qty 10

## 2020-09-17 MED ORDER — BUPIVACAINE HCL (PF) 0.5 % IJ SOLN
INTRAMUSCULAR | Status: AC
  Filled 2020-09-17: qty 30

## 2020-09-17 MED ORDER — CLEVIDIPINE BUTYRATE 0.5 MG/ML IV EMUL
0.0000 mg/h | INTRAVENOUS | Status: DC
  Administered 2020-09-17 (×2): 19 mg/h via INTRAVENOUS
  Administered 2020-09-17: 15 mg/h via INTRAVENOUS
  Administered 2020-09-17: 19 mg/h via INTRAVENOUS
  Administered 2020-09-18: 21 mg/h via INTRAVENOUS
  Administered 2020-09-18: 19 mg/h via INTRAVENOUS
  Administered 2020-09-18: 16 mg/h via INTRAVENOUS
  Administered 2020-09-18 (×3): 19 mg/h via INTRAVENOUS
  Administered 2020-09-19: 2 mg/h via INTRAVENOUS
  Administered 2020-09-19: 6 mg/h via INTRAVENOUS
  Filled 2020-09-17 (×3): qty 50
  Filled 2020-09-17: qty 100
  Filled 2020-09-17 (×2): qty 50
  Filled 2020-09-17 (×3): qty 100

## 2020-09-17 MED ORDER — SODIUM CHLORIDE 0.9 % IV SOLN: 250.0000 mL | INTRAVENOUS | Status: DC

## 2020-09-17 MED ORDER — DEXTROSE 5 % IV SOLN
INTRAVENOUS | Status: DC | PRN
  Administered 2020-09-17 (×2): 3 g via INTRAVENOUS

## 2020-09-17 MED ORDER — LIDOCAINE 2% (20 MG/ML) 5 ML SYRINGE
INTRAMUSCULAR | Status: AC
  Filled 2020-09-17: qty 5

## 2020-09-17 MED ORDER — HYDRALAZINE HCL 20 MG/ML IJ SOLN
10.0000 mg | INTRAMUSCULAR | Status: DC | PRN
  Administered 2020-09-19: 10 mg via INTRAVENOUS
  Filled 2020-09-17: qty 1

## 2020-09-17 MED ORDER — ACETAMINOPHEN 650 MG RE SUPP: 650.0000 mg | RECTAL | Status: DC | PRN

## 2020-09-17 MED ORDER — DILTIAZEM HCL ER COATED BEADS 120 MG PO CP24
120.0000 mg | ORAL_CAPSULE | Freq: Every day | ORAL | Status: DC
  Administered 2020-09-18 – 2020-09-19 (×2): 120 mg via ORAL
  Filled 2020-09-17 (×2): qty 1

## 2020-09-17 MED ORDER — ONDANSETRON HCL 4 MG/2ML IJ SOLN
INTRAMUSCULAR | Status: DC | PRN
  Administered 2020-09-17: 4 mg via INTRAVENOUS

## 2020-09-17 MED ORDER — FLUTICASONE-UMECLIDIN-VILANT 100-62.5-25 MCG/INH IN AEPB: 1.0000 | INHALATION_SPRAY | Freq: Every day | RESPIRATORY_TRACT | Status: DC

## 2020-09-17 MED ORDER — GLIPIZIDE 10 MG PO TABS
10.0000 mg | ORAL_TABLET | Freq: Two times a day (BID) | ORAL | Status: DC
  Administered 2020-09-18 – 2020-09-19 (×4): 10 mg via ORAL
  Filled 2020-09-17 (×8): qty 1

## 2020-09-17 MED ORDER — DULAGLUTIDE 1.5 MG/0.5ML ~~LOC~~ SOAJ: 1.5000 mg | SUBCUTANEOUS | Status: DC

## 2020-09-17 MED ORDER — FENTANYL CITRATE (PF) 100 MCG/2ML IJ SOLN
25.0000 ug | INTRAMUSCULAR | Status: DC | PRN
  Administered 2020-09-17: 50 ug via INTRAVENOUS

## 2020-09-17 MED ORDER — HYDRALAZINE HCL 50 MG PO TABS
100.0000 mg | ORAL_TABLET | Freq: Three times a day (TID) | ORAL | Status: DC
  Administered 2020-09-17 – 2020-09-20 (×8): 100 mg via ORAL
  Filled 2020-09-17 (×8): qty 2

## 2020-09-17 MED ORDER — ORAL CARE MOUTH RINSE
15.0000 mL | Freq: Two times a day (BID) | OROMUCOSAL | Status: DC
  Administered 2020-09-17 – 2020-09-20 (×5): 15 mL via OROMUCOSAL

## 2020-09-17 MED ORDER — ALBUMIN HUMAN 5 % IV SOLN: INTRAVENOUS | Status: DC | PRN

## 2020-09-17 MED ORDER — MENTHOL 3 MG MT LOZG
1.0000 | LOZENGE | OROMUCOSAL | Status: DC | PRN
  Filled 2020-09-17: qty 9

## 2020-09-17 MED ORDER — FLUTICASONE FUROATE-VILANTEROL 100-25 MCG/INH IN AEPB
1.0000 | INHALATION_SPRAY | Freq: Every day | RESPIRATORY_TRACT | Status: DC
  Administered 2020-09-18 – 2020-09-20 (×2): 1 via RESPIRATORY_TRACT
  Filled 2020-09-17 (×2): qty 28

## 2020-09-17 MED ORDER — FENTANYL CITRATE (PF) 100 MCG/2ML IJ SOLN
INTRAMUSCULAR | Status: AC
  Filled 2020-09-17: qty 2

## 2020-09-17 MED ORDER — LIDOCAINE-EPINEPHRINE 1 %-1:100000 IJ SOLN
INTRAMUSCULAR | Status: DC | PRN
  Administered 2020-09-17: 7 mL

## 2020-09-17 MED ORDER — CHLORHEXIDINE GLUCONATE 0.12 % MT SOLN
15.0000 mL | Freq: Once | OROMUCOSAL | Status: AC
  Administered 2020-09-17: 15 mL via OROMUCOSAL
  Filled 2020-09-17: qty 15

## 2020-09-17 MED ORDER — SUFENTANIL CITRATE 50 MCG/ML IV SOLN
INTRAVENOUS | Status: DC | PRN
  Administered 2020-09-17: 10 ug via INTRAVENOUS
  Administered 2020-09-17: 15 ug via INTRAVENOUS
  Administered 2020-09-17: 10 ug via INTRAVENOUS
  Administered 2020-09-17: 5 ug via INTRAVENOUS
  Administered 2020-09-17: 20 ug via INTRAVENOUS
  Administered 2020-09-17: 15 ug via INTRAVENOUS
  Administered 2020-09-17: 20 ug via INTRAVENOUS
  Administered 2020-09-17: 5 ug via INTRAVENOUS

## 2020-09-17 MED ORDER — LABETALOL HCL 5 MG/ML IV SOLN
INTRAVENOUS | Status: AC
  Filled 2020-09-17: qty 4

## 2020-09-17 MED ORDER — DEXAMETHASONE SODIUM PHOSPHATE 10 MG/ML IJ SOLN
INTRAMUSCULAR | Status: AC
  Filled 2020-09-17: qty 1

## 2020-09-17 MED ORDER — HYDROCHLOROTHIAZIDE 25 MG PO TABS
25.0000 mg | ORAL_TABLET | Freq: Every day | ORAL | Status: DC
  Administered 2020-09-18 – 2020-09-20 (×3): 25 mg via ORAL
  Filled 2020-09-17 (×3): qty 1

## 2020-09-17 MED ORDER — SODIUM CHLORIDE 0.9% FLUSH: 3.0000 mL | INTRAVENOUS | Status: DC | PRN

## 2020-09-17 MED ORDER — OXYCODONE HCL 5 MG/5ML PO SOLN: 5.0000 mg | Freq: Once | ORAL | Status: DC | PRN

## 2020-09-17 MED ORDER — OXYCODONE HCL 5 MG PO TABS: 5.0000 mg | ORAL_TABLET | Freq: Once | ORAL | Status: DC | PRN

## 2020-09-17 MED ORDER — GLYCOPYRROLATE 0.2 MG/ML IJ SOLN
INTRAMUSCULAR | Status: DC | PRN
  Administered 2020-09-17: .2 mg via INTRAVENOUS

## 2020-09-17 MED ORDER — ROSUVASTATIN CALCIUM 20 MG PO TABS
40.0000 mg | ORAL_TABLET | Freq: Every day | ORAL | Status: DC
  Administered 2020-09-17 – 2020-09-20 (×4): 40 mg via ORAL
  Filled 2020-09-17 (×4): qty 2

## 2020-09-17 MED ORDER — POTASSIUM CHLORIDE IN NACL 20-0.9 MEQ/L-% IV SOLN
INTRAVENOUS | Status: DC
  Filled 2020-09-17: qty 1000

## 2020-09-17 MED ORDER — TAMSULOSIN HCL 0.4 MG PO CAPS
0.4000 mg | ORAL_CAPSULE | Freq: Every day | ORAL | Status: DC
Start: 2020-09-18 — End: 2020-09-20
  Administered 2020-09-18 – 2020-09-20 (×3): 0.4 mg via ORAL
  Filled 2020-09-17 (×3): qty 1

## 2020-09-17 MED ORDER — ORAL CARE MOUTH RINSE: 15.0000 mL | Freq: Once | OROMUCOSAL | Status: AC

## 2020-09-17 MED ORDER — ENALAPRILAT 1.25 MG/ML IV SOLN: 1.2500 mg | Freq: Four times a day (QID) | INTRAVENOUS | Status: DC

## 2020-09-17 MED ORDER — POLYETHYLENE GLYCOL 3350 17 G PO PACK: 17.0000 g | PACK | Freq: Every day | ORAL | Status: DC | PRN

## 2020-09-17 MED ORDER — LACTATED RINGERS IV SOLN: INTRAVENOUS | Status: DC

## 2020-09-17 MED ORDER — ACETAMINOPHEN 325 MG PO TABS
650.0000 mg | ORAL_TABLET | ORAL | Status: DC | PRN
  Administered 2020-09-18: 650 mg via ORAL
  Filled 2020-09-17: qty 2

## 2020-09-17 MED ORDER — LINAGLIPTIN 5 MG PO TABS
5.0000 mg | ORAL_TABLET | Freq: Every day | ORAL | Status: DC
  Administered 2020-09-18 – 2020-09-20 (×3): 5 mg via ORAL
  Filled 2020-09-17 (×3): qty 1

## 2020-09-17 MED ORDER — ISOSORBIDE MONONITRATE ER 30 MG PO TB24
30.0000 mg | ORAL_TABLET | Freq: Every day | ORAL | Status: DC
  Administered 2020-09-17 – 2020-09-19 (×3): 30 mg via ORAL
  Filled 2020-09-17 (×3): qty 1

## 2020-09-17 MED ORDER — CEFAZOLIN SODIUM-DEXTROSE 2-4 GM/100ML-% IV SOLN
2.0000 g | Freq: Three times a day (TID) | INTRAVENOUS | Status: AC
  Administered 2020-09-17 – 2020-09-18 (×3): 2 g via INTRAVENOUS
  Filled 2020-09-17 (×3): qty 100

## 2020-09-17 MED ORDER — 0.9 % SODIUM CHLORIDE (POUR BTL) OPTIME
TOPICAL | Status: DC | PRN
  Administered 2020-09-17: 1000 mL

## 2020-09-17 SURGICAL SUPPLY — 85 items
APL SKNCLS STERI-STRIP NONHPOA (GAUZE/BANDAGES/DRESSINGS) ×1
BAND INSRT 18 STRL LF DISP RB (MISCELLANEOUS) ×2
BAND RUBBER #18 3X1/16 STRL (MISCELLANEOUS) ×6 IMPLANT
BASKET BONE COLLECTION (BASKET) IMPLANT
BENZOIN TINCTURE PRP APPL 2/3 (GAUZE/BANDAGES/DRESSINGS) ×3 IMPLANT
BIT DRILL NEURO 2X3.1 SFT TUCH (MISCELLANEOUS) ×1 IMPLANT
BLADE CLIPPER SURG (BLADE) IMPLANT
BLADE SURG 15 STRL LF DISP TIS (BLADE) IMPLANT
BLADE SURG 15 STRL SS (BLADE)
BLADE ULTRA TIP 2M (BLADE) IMPLANT
BUR MATCHSTICK NEURO 3.0 LAGG (BURR) ×3 IMPLANT
BUR RND DIAMOND ELITE 4.0 (BURR) IMPLANT
BUR RND DIAMOND ELITE 4.0MM (BURR)
CANISTER SUCT 3000ML PPV (MISCELLANEOUS) ×3 IMPLANT
CLOSURE STERI-STRIP 1/2X4 (GAUZE/BANDAGES/DRESSINGS) ×1
CLOSURE WOUND 1/2 X4 (GAUZE/BANDAGES/DRESSINGS) ×2
CLSR STERI-STRIP ANTIMIC 1/2X4 (GAUZE/BANDAGES/DRESSINGS) ×1 IMPLANT
COVER WAND RF STERILE (DRAPES) ×1 IMPLANT
DECANTER SPIKE VIAL GLASS SM (MISCELLANEOUS) ×1 IMPLANT
DEVICE ENDSKLTN IMPL 16X14X7X6 (Cage) IMPLANT
DEVICE ENDSKLTN TC NANOLCK 6MM (Cage) IMPLANT
DRAPE C-ARM 42X72 X-RAY (DRAPES) ×6 IMPLANT
DRAPE HALF SHEET 40X57 (DRAPES) ×2 IMPLANT
DRAPE LAPAROTOMY 100X72 PEDS (DRAPES) ×3 IMPLANT
DRAPE MICROSCOPE LEICA (MISCELLANEOUS) ×3 IMPLANT
DRILL NEURO 2X3.1 SOFT TOUCH (MISCELLANEOUS) ×3
DRSG MEPILEX BORDER 4X4 (GAUZE/BANDAGES/DRESSINGS) ×3 IMPLANT
DRSG OPSITE 4X5.5 SM (GAUZE/BANDAGES/DRESSINGS) ×2 IMPLANT
DRSG OPSITE POSTOP 4X6 (GAUZE/BANDAGES/DRESSINGS) ×2 IMPLANT
DURAPREP 26ML APPLICATOR (WOUND CARE) ×3 IMPLANT
ELECT COATED BLADE 2.86 ST (ELECTRODE) ×3 IMPLANT
ELECT REM PT RETURN 9FT ADLT (ELECTROSURGICAL) ×3
ELECTRODE REM PT RTRN 9FT ADLT (ELECTROSURGICAL) ×1 IMPLANT
ENDOSKELETON IMPLANT 16X14X7X6 (Cage) ×12 IMPLANT
ENDOSKELETON TC NANOLOCK 6MM (Cage) ×6 IMPLANT
EVACUATOR 1/8 PVC DRAIN (DRAIN) ×2 IMPLANT
GAUZE 4X4 16PLY RFD (DISPOSABLE) IMPLANT
GAUZE SPONGE 4X4 12PLY STRL (GAUZE/BANDAGES/DRESSINGS) ×2 IMPLANT
GLOVE BIOGEL PI IND STRL 7.5 (GLOVE) ×1 IMPLANT
GLOVE BIOGEL PI INDICATOR 7.5 (GLOVE) ×2
GLOVE BIOGEL PI ORTHO PRO SZ7 (GLOVE) ×4
GLOVE ECLIPSE 7.5 STRL STRAW (GLOVE) ×3 IMPLANT
GLOVE ECLIPSE 8.0 STRL XLNG CF (GLOVE) ×6 IMPLANT
GLOVE ECLIPSE 8.5 STRL (GLOVE) ×2 IMPLANT
GLOVE PI ORTHO PRO STRL SZ7 (GLOVE) IMPLANT
GLOVE SRG 8 PF TXTR STRL LF DI (GLOVE) IMPLANT
GLOVE SURG PR MICRO ENCORE 7.5 (GLOVE) ×2 IMPLANT
GLOVE SURG UNDER POLY LF SZ6.5 (GLOVE) ×8 IMPLANT
GLOVE SURG UNDER POLY LF SZ7 (GLOVE) ×2 IMPLANT
GLOVE SURG UNDER POLY LF SZ7.5 (GLOVE) ×4 IMPLANT
GLOVE SURG UNDER POLY LF SZ8 (GLOVE) ×6
GOWN STRL REUS W/ TWL LRG LVL3 (GOWN DISPOSABLE) ×2 IMPLANT
GOWN STRL REUS W/ TWL XL LVL3 (GOWN DISPOSABLE) IMPLANT
GOWN STRL REUS W/TWL 2XL LVL3 (GOWN DISPOSABLE) IMPLANT
GOWN STRL REUS W/TWL LRG LVL3 (GOWN DISPOSABLE) ×15
GOWN STRL REUS W/TWL XL LVL3 (GOWN DISPOSABLE) ×9
HEMOSTAT POWDER KIT SURGIFOAM (HEMOSTASIS) ×3 IMPLANT
KIT BASIN OR (CUSTOM PROCEDURE TRAY) ×3 IMPLANT
KIT TURNOVER KIT B (KITS) ×3 IMPLANT
NDL SPNL 22GX3.5 QUINCKE BK (NEEDLE) ×1 IMPLANT
NEEDLE HYPO 22GX1.5 SAFETY (NEEDLE) ×3 IMPLANT
NEEDLE SPNL 22GX3.5 QUINCKE BK (NEEDLE) ×3 IMPLANT
NS IRRIG 1000ML POUR BTL (IV SOLUTION) ×3 IMPLANT
PACK LAMINECTOMY NEURO (CUSTOM PROCEDURE TRAY) ×3 IMPLANT
PAD ARMBOARD 7.5X6 YLW CONV (MISCELLANEOUS) ×9 IMPLANT
PIN DISTRACTION 14MM (PIN) ×6 IMPLANT
PLATE ZEVO 1LVL 17MM (Plate) ×2 IMPLANT
PLATE ZEVO 2LVL 35MM (Plate) ×2 IMPLANT
PUTTY DBF 3CC CORTICAL FIBERS (Putty) ×2 IMPLANT
SCREW 3.5 SELFDRILL 15MM VARI (Screw) ×20 IMPLANT
SLEEVE SURGEON STRL (DRAPES) ×2 IMPLANT
SPONGE INTESTINAL PEANUT (DISPOSABLE) ×5 IMPLANT
SPONGE SURGIFOAM ABS GEL SZ50 (HEMOSTASIS) ×1 IMPLANT
STAPLER VISISTAT 35W (STAPLE) IMPLANT
STRIP CLOSURE SKIN 1/2X4 (GAUZE/BANDAGES/DRESSINGS) ×3 IMPLANT
SUT MNCRL AB 4-0 PS2 18 (SUTURE) ×3 IMPLANT
SUT SILK 2 0 TIES 10X30 (SUTURE) IMPLANT
SUT VIC AB 0 CT1 27 (SUTURE) ×3
SUT VIC AB 0 CT1 27XBRD ANTBC (SUTURE) IMPLANT
SUT VIC AB 2-0 CP2 18 (SUTURE) ×3 IMPLANT
SUT VIC AB 3-0 SH 8-18 (SUTURE) ×2 IMPLANT
TAPE CLOTH 3X10 TAN LF (GAUZE/BANDAGES/DRESSINGS) ×3 IMPLANT
TOWEL GREEN STERILE (TOWEL DISPOSABLE) ×3 IMPLANT
TOWEL GREEN STERILE FF (TOWEL DISPOSABLE) ×3 IMPLANT
WATER STERILE IRR 1000ML POUR (IV SOLUTION) ×3 IMPLANT

## 2020-09-17 NOTE — Progress Notes (Signed)
Called J. Thomas MD to verify BP parameters--MAP goal 70-90. Cleviprex reordered, PRN orders placed.  Patient A&Ox4. Wife at bedside.

## 2020-09-17 NOTE — Progress Notes (Signed)
1. neckpain Vincent Ramos returns to clinic for follow-up. He reports over the past 10 months, his hand numbness has worsened somewhat. He still has some weakness in his right hand. Additionally, he has some stiffness in his right leg that has occasionally made it somewhat difficult for him to walk. Medical/Surgical/Interim History Reviewed, no change. Last detailed document date:09/27/2019. Family History: Reviewed, n o changes. Last detailed document date:09/27/2019. Social History: Reviewed, no changes. Last detailed document date: 09/27/2019. MEDICATIONS: (added, continued or stopped this visit)  Started Berrong, Arnold Medication Advair Diskus 250 mcg-50 mcg/dose powder for inhalation Directions inhale 1 puff by inhalation route 2 times every day in the morning and evening approximately 12 hours apart chew 1 tablet by o ral route Instruction Stopped  aspirin 81 mg [id number] Aug 06, 1950 08/21/2020 10:15 AM Page: 1/6  ALLERGIES: Ingredient NO KNOWN ALLERGIES Medication Name Comment chewable tablet Breo Ellipta 100 mcg-25 mcg/dose powder for inhalation Depo-Testosterone 200 mg/mL intramuscular oil isosorbide mononitrate ER 30 mg tablet,extended release 24 hr pantoprazole 40 mg tablet,delayed release Proair Digihaler 9 0 mcg/actuation aerosol powder breath act, sensor rosuvastatin40mg  tablet Tresiba FlexTouch U-100 insulin 100 unit/mL (3 mL) subcutaneous pen Trulicity 1.5 WJ/1.9 mL subcutaneous pen injector Reaction every day inhale 1 puff by inhalation route every day at the same time each day inject 1 milliliter by intramuscular route every 4 weeks take 1 tablet by oral route every day in the morning take 1 tablet by oral rou te every day inhale 2 puff by inhalation route every 4 - 6 hours as needed take1tabletbyoralroute every day inject by subcutaneous route as per insulin protocol inject 0.5 milliliter by subcutaneous route every week in the abdomen, thigh, or upper arm  rotating injection sites  No known allergies. Reviewed, no changes. PHYSICAL EXAM: Vitals Date Temp F 08/21/2020 BP Pulse 174/78 65 Ht In 66.5 Wt  Lb BMI 280 44.52 BSA Pain Score 8/10   PHYSICAL EXAM Details General Level of Distress: no acute distress Overall Appearance: Normal Vincent, Ramos [id number] 1950-11-04 08/21/2020 10:15 AM Page: 2/6  Head and Face Fundoscopic Exam: Cardiovascular Peripheral Pulses: Carotid Pulses: Respiratory Lungs: Neurological Right unable to visualize Left unable to visualize Orientat ion: Recent and Remote Memory: Attention Span and Concentration: Language: Fund of Knowledge: Sensation: Upper Extremity Coordination: Lower Extremity Coordination: Musculoskeletal Gait and Station: Upper Extremity Muscle Tone: Lower Extremity Muscle Tone: Motor Strength Left normal normal normal Left . Any abnormal findings will be noted below. Right Left 5/5 5/5 5/5 5/5 5/5 5/5 5/5 5/5 5/5 5/5  5/5 5/5 Deltoid: Biceps: Triceps: Wrist Extensor: Grip: Finger Extensor: Hip Flexor: Knee Extensor: Knee Flexor: Tib Anterior: EHL: Medial Gastroc: 5/5 5/5 5/5 4+/5 4/5 4/5 5/5 5/5 5/5 5/5 5/5 5/5 Ramos, Vincent [id number] 24-Feb-1951 08/21/2020 10:15 AM Page: 3/6 Right normal non-labored Left normal normal normal normal normal normal normal Right normal normal normal nor mal Right normal normal  Gaze Normal Horizontal Gaze Stability: Deep Tendon Reflexes Right Biceps: 2+ Brachioradialis: 2+ Patellar: 2+ Achilles: 2+ Sensory Sensation was tested at C2 to T1 and L1 to S1. Cranial Nerves II. Optic Nerve/Visual Fields: III. Oculomotor: IV. Trochlear: V. Trigeminal: normal EOMI EOMI sensory intact EOMI VI. Abducens: VII. Facial: VIII. Acoustic/Vestibul ar: IX. Glossopharyngeal: X. Vagus: XI. Spinal Accessory: XII. Hypoglossal: Motor and other Tests Spurlings Additional Findings: numbness in R>L hands Right no facial  droop hearing intact palate elevation symmetric no hoarseness shoulder shrug full tongue protrusion midline Left normal positive negative Left 2+ 2+ 2+ 2+ DIAGNOSTIC RESULTS:  His MRI cervical spine without contras t was reviewed again. He has multilevel severe cervical degenerative disease with cervical kyphosis. There is severe stenosis at C4-5, C5-6 and C6-7, with moderate to severe stenosis at C3-4. There likely is a component of some calcified PLL behind the C4 body. IMPRESSION: This is a 70 year old man with diabetes, obesity, and a history of cocaine abuse who has cervical stenosis with myelopathy.   Vincent, Ramos [id number] 72 /09/1950 08/21/2020 10:15 AM Page: 4/6  Patient is on an anti-coagulant, anti-inflammatory or supplement that may increase bleeding time. Patient advised to stop medicine prior to surgery. Comments: Holdaspirinfor7dayspriortosurgery Patient states he received medical clearance from his PCP for surgery PLAN: Although his myelopathy is not advanced, and he mostly has hand weakness and numbness with some right leg stiffne ss, given the severe stenosis with early myelomalacia, I do recommend surgical intervention to help him maintainhisfunctionalityforaslongaspossible. ThiswouldbeC4-5,C5-6,andC6-7ACDFtohelpcorrect somewhathiscervicalkyphosis. HehassignificantdegenerativediseasewithstenosisatC3-4,butgivenhisbody habitus and the additional complexity, I would manage that level expectantly for now. Patient states he has medical clearance for surgery. We will  make the appropriate arrangements for surgery. All questions and concerns were answered. Orders: Instruction(s)/Education: Assessment Instruction I10 Lifestyle education Z68.41 Lifestyle education regarding diet Completed Orders (this encounter)          Order Lifestyle education Details Patient will follow up with primary care physician Reason Side  Interpretation Result  Initial Region Treatment   Date  Lifestyle education regarding diet  Encouraged patient to eat balanced diet high in fruit and vegetables.  Assessment/Plan # Detail Type 1. Assessment 2. Assessment 3. Assessment Description   Stenosis of cervical spine with myelopathy (M48.02). Essential (primary) hypertension (I10). Body mass index (BMI) 40.0-44.9, adult (Z68.41).   Plan Orders  Today's instructions / counseling include(s ) Lifestyle education regarding diet. Clinical information/comments: Encouraged patient to eat balanced diet high in fruit and vegetables.  Pain Management Plan Pain Scale: 8/10. Method: Numeric Pain Intensity Scale. Javaris, [place] [id number] 1951/01/18 0l

## 2020-09-17 NOTE — Anesthesia Procedure Notes (Signed)
Procedure Name: Intubation Date/Time: 09/17/2020 7:57 AM Performed by: Claris Che, CRNA Pre-anesthesia Checklist: Patient identified, Emergency Drugs available, Suction available, Patient being monitored and Timeout performed Patient Re-evaluated:Patient Re-evaluated prior to induction Oxygen Delivery Method: Circle system utilized Preoxygenation: Pre-oxygenation with 100% oxygen Induction Type: IV induction and Cricoid Pressure applied Ventilation: Mask ventilation without difficulty and Oral airway inserted - appropriate to patient size Laryngoscope Size: Glidescope Grade View: Grade II Tube type: Oral Tube size: 7.5 mm Airway Equipment and Method: Stylet and Video-laryngoscopy Placement Confirmation: ETT inserted through vocal cords under direct vision,  positive ETCO2 and breath sounds checked- equal and bilateral Secured at: 24 cm Tube secured with: Tape Dental Injury: Teeth and Oropharynx as per pre-operative assessment

## 2020-09-17 NOTE — Op Note (Signed)
PREOP DIAGNOSIS: Cervical stenosis with myelopathy  POSTOP DIAGNOSIS: Cervical stenosis with myelopathy   PROCEDURE: 1. Arthrodesis C4-5, anterior interbody technique, including Discectomy for decompression of spinal cord and exiting nerve roots with foraminotomies  2. Arthrodesis, additional level C5-6 anterior interbody technique, including Discectomy for decompression of spinal cord and exiting nerve roots with foraminotomies  3. 2. Arthrodesis, additional level C6-7  anterior interbody technique, including Discectomy for decompression of spinal cord and exiting nerve roots with foraminotomies  4. Placement of intervertebral biomechanical device C4-5 5. Placement of intervertebral biomechanical device C5-6 6. Placement of intervertebral biomechanical device C6-7 7. Placement of anterior instrumentation consisting of interbody plate and screws J1-8-8, and C6-7 8. Use of morselized bone allograft  9. Use of intraoperative microscope  SURGEON: Dr. Duffy Rhody, MD  ASSISTANT: Elwin Sleight, DO.  Please note, no qualified trainees were available to assist with the procedure.  Assistance was required for retraction of the visceral structures to safely allow for instrumentation.  ANESTHESIA: General Endotracheal  EBL: 50 ml  IMPLANTS: Medtronic 7 x 16 x 14 medium Titan C cage at C4-5 Medtronic 7 x 16 x 14 medium Titan C cage at C5-6 Medtronic 6 x 16 x 14 medium Titan C cage at C6-7 35 mm plate with 15 mm screws at C-4-5-6 17 mm Zevo plate with 15 mm screws at C6-7 DBF  SPECIMENS: None  DRAINS: None  COMPLICATIONS: None immediate  CONDITION: Hemodynamically stable to PACU  HISTORY: This is a 70 year old man with morbid obesity, diabetes and asthma who presented to clinic with progressive myelopathy.  He had bilateral hand numbness, and increased difficulty walking and hand weakness.  Nonsurgical therapies including medical therapy and physical therapy did not improve his  symptoms.  His MRI showed cervical kyphosis with severe stenosis and cord impingement at C4-5, C5-6, and C6-7.  At C3-4, he had moderate stenosis.  I had a long discussion with the patient regarding treatment options and discussed the option of multilevel ACDF which would likely give him the best chance of functional recovery given his early myelopathy symptoms.  Risks, benefits, alternatives, and expected convalescence were discussed with the patient.  Risks discussed included but were not limited to bleeding, pain, infection, pseudoarthrosis, hardware failure, adjacent segment disease, CSF leak, neurologic deficits, weakness, numbness, paralysis, coma, and death. After all questions were answered, informed consent was obtained.  PROCEDURE IN DETAIL: The patient was brought to the operating room and transferred to the operative table. After induction of general anesthesia, the patient was positioned on the operative table in the supine position with all pressure points meticulously padded. The skin of the neck was then prepped and draped in the usual sterile fashion.  After timeout was conducted, the skin was infiltrated with local anesthetic. Skin incision was then made sharply and Bovie electrocautery was used to dissect the subcutaneous tissue until the platysma was identified. The platysma was then divided and undermined. The sternocleidomastoid muscle was then identified and, utilizing natural fascial planes in the neck, the prevertebral fascia was identified and the carotid sheath was retracted laterally and the trachea and esophagus retracted medially. Again using fluoroscopy, the C4-5 disc space was identified. Bovie electrocautery was used to dissect in the subperiosteal plane and elevate the bilateral longus coli muscles, exposing the C4-5, C5-6, and C6-7 disc spaces.  There are large anterior osteophytes, especially at C4-5 and C5-6 which flared the normal anatomy.  Self-retaining retractors were  then placed. Caspar distraction pins were placed in the adjacent  bodies to allow for gentle distraction.  At this point, the microscope was draped and brought into the field, and the remainder of the case was done under the microscope using microdissecting technique.  The C6-7 disc space was incised sharply and combination of high speed drill, curettes, and rongeurs were use to initially complete a discectomy.  The disc was very collapsed and a high-speed drill was then used to complete discectomy until the posterior annulus was identified and removed and the posterior longitudinal ligament was identified. Using a nerve hook, the PLL was elevated, and Kerrison rongeurs were used to remove the posterior longitudinal ligament and the ventral thecal sac was identified. Using a combination of curettes and rongeurs, complete decompression of the thecal sac and exiting nerve roots at this level was completed, and verified with easy passage of micro-nerve hook centrally and in the bilateral foramina.  Having completed our decompression, attention was turned to placement of the intervertebral device. Trial spacers were used to select a size 6 mm graft. This graft was then filled with morcellized allograft, and inserted under live fluoroscopy.  Given the difficulty in reaching the C6-7 level as C7 was sloping significantly away, it was thought it would the be best to place an anterior plate at this level independent of additional plating.  A 17 mm plate was selected and using high-speed drill the cortex of the cervical bodies were punctured and screws were inserted using fluoroscopic aid.  Attention was then turned to the C5-6 level.  The disc was very collapsed.  In a similar fashion, discectomy was completed initially with curettes and rongeurs, and completed with the drill. The PLL was partially calcified and fairly stuck to the dura so there was a fair amount of effort to dissected from the epidural space with a  nerve hook.  Using Kerrison rongeurs, decompression of the spinal cord and exiting roots was completed and confirmed with a dissector. Trial spacers were used to select a 7 mm graft. This graft was then filled with morcellized allograft, and inserted under live fluoroscopy.  Attention was then turned to the C4-5 level.  The disc was very collapsed.  In a similar fashion, discectomy was completed initially with curettes and rongeurs, and completed with the drill.  Caspar pins were placed in the C4 and C5 body to allow for distraction of the disc space and height restoration.  The PLL was partially calcified and fairly stuck to the dura so there was a fair amount of effort to dissected from the epidural space with a nerve hook, though not as severe as at the C5-6 level.  Using Kerrison rongeurs, decompression of the spinal cord and exiting roots was completed and confirmed with a dissector. Trial spacers were used to select a 7 mm graft. This graft was then filled with morcellized allograft, and inserted under live fluoroscopy.  After placement of the intervertebral devices, the caspar pins were removed.  An anterior cervical plate was placed across the interspaces for anterior fixation.  Using a high-speed drill, the cortex of the cervical vertebral bodies was punctured, and screws inserted in the vertebral bodies. Final fluoroscopic images in AP and lateral projections were taken to confirm good hardware placement.  At this point, after all counts were verified to be correct, meticulous hemostasis was secured using a combination of bipolar electrocautery and passive hemostatics.  Given the ooziness of his bone as well as the large size of his wound, a medium Hemovac drain was placed in the  deep cervical space and tunneled out the skin and secured with a stitch.  The platysma muscle was then closed using interrupted 3-0 Vicryl sutures, and the skin was closed with a 4-0 monocryl in subcutical fashion. Sterile  dressings were then applied and the drapes removed.  The patient tolerated the procedure well and was extubated in the room and taken to the postanesthesia care unit in stable condition.  All counts were correct at the end of the procedure.

## 2020-09-17 NOTE — Transfer of Care (Signed)
Immediate Anesthesia Transfer of Care Note  Patient: Vincent Ramos  Procedure(s) Performed: Anterior Cevrvical Decompresion / Discectomy Fusion Cervical four-five,Cervical five-six,Cervical six- seven (N/A Neck)  Patient Location: PACU  Anesthesia Type:General  Level of Consciousness: drowsy, patient cooperative and responds to stimulation  Airway & Oxygen Therapy: Patient Spontanous Breathing and Patient connected to face mask oxygen  Post-op Assessment: Report given to RN, Post -op Vital signs reviewed and stable and Patient moving all extremities X 4  Post vital signs: Reviewed and stable  Last Vitals:  Vitals Value Taken Time  BP    Temp    Pulse    Resp    SpO2      Last Pain:  Vitals:   09/17/20 [id number]  TempSrc: Oral  PainSc:          Complications: No complications documented.

## 2020-09-17 NOTE — H&P (Signed)
CC: neck pain/hand numbness  HPI:     Patient is a 70 y.o. male presents with worsening hand numbness and clumsiness over the past 10 months.  Nonsurgical therapies did not help with this.  He additionally has noted stiffness in his leg with difficulty walking. He also has significant neck pain.    Patient Active Problem List   Diagnosis Date Noted  . SBO (small bowel obstruction) ([place]) 05/08/2019  . Constipation 02/06/2018  . Abnormal nuclear cardiac imaging test 12/27/2017  . Respiratory failure ([place]) 09/18/2017  . COPD exacerbation ([place]) 09/17/2017  . Elevated troponin 09/17/2017  . Grade II diastolic dysfunction 29/56/2130  . Acute on chronic diastolic CHF (congestive heart failure) ([place]) 09/17/2017  . Polyp of rectum   . Encounter for screening colonoscopy 07/09/2017  . Aspiration pneumonia ([place]) 06/06/2017  . Dysphagia   . Acute respiratory failure with hypoxia ([place]) 06/01/2017  . PNA (pneumonia) 05/30/2017  . Lumbar stenosis 10/07/2015  . Type 2 diabetes mellitus ([place]) 07/31/2014  . Hypertension 07/31/2014  . Obesity 07/31/2014  . Community acquired pneumonia 07/31/2014   Past Medical History:  Diagnosis Date  . Abnormal nuclear cardiac imaging test 12/27/2017  . Acute on chronic diastolic CHF (congestive heart failure) ([place]) 09/17/2017  . Acute respiratory failure with hypoxia ([place]) 06/01/2017  . Arthritis   . Asthma   . CAD (coronary artery disease)    a. 11/2017: cath showing widely patent coronary arteries with normal left main, 30% mid LAD, luminal irregularities in the proximal and mid circumflex, and 30 to 40% stenosis in the mid RCA.  Marland Kitchen CAP (community acquired pneumonia) 07/31/2014  . Community acquired pneumonia 07/31/2014  . Constipation 02/06/2018  . COPD exacerbation ([place]) 09/17/2017  . Dysphagia   . Elevated troponin 09/17/2017  . Enlarged prostate   . Grade II diastolic dysfunction   . Hypertension   . Lumbar stenosis 10/07/2015  . Obesity 07/31/2014  .  PNA (pneumonia) 05/30/2017  . Pneumonia   . Pneumonia   . Polyp of rectum   . Respiratory failure ([place]) 09/18/2017  . SBO (small bowel obstruction) ([place]) 05/08/2019  . Shortness of breath   . Sleep apnea    Sleep Study 08/21/17: Severe OSA  . Type 2 diabetes mellitus ([place])     Past Surgical History:  Procedure Laterality Date  . CIRCUMCISION    . COLONOSCOPY WITH PROPOFOL N/A 08/08/2017   Procedure: COLONOSCOPY WITH PROPOFOL;  Surgeon: Danie Binder, MD;  [facility];  Service: Endoscopy;  Laterality: N/A;  1:00pm  . ESOPHAGEAL DILATION N/A 06/07/2017   Procedure: ESOPHAGEAL DILATION;  Surgeon: Daneil Dolin, MD;  Location: [facility];  Service: Endoscopy;  Laterality: N/A;  . ESOPHAGOGASTRODUODENOSCOPY (EGD) WITH PROPOFOL N/A 06/07/2017   Procedure: ESOPHAGOGASTRODUODENOSCOPY (EGD) WITH PROPOFOL;  Surgeon: Daneil Dolin, MD;  [facility]: [facility];  Service: Endoscopy;  Laterality: N/A;  . HYDROCELE EXCISION  10/14/2011   Procedure: HYDROCELECTOMY ADULT;  Surgeon: Malka So, MD;  Location: [facility];  Service: Urology;  Laterality: Right;  . LACERATION REPAIR     left hand  . LUMBAR LAMINECTOMY/DECOMPRESSION MICRODISCECTOMY Right 10/07/2015   Procedure: Right Lumbar four-five ,lumbar five sacral-one Laminectomy;  Surgeon: Leeroy Cha, MD;  Location: [facility];  Service: Neurosurgery;  Laterality: Right;  . RIGHT/LEFT HEART CATH AND CORONARY ANGIOGRAPHY N/A 12/27/2017   Procedure: RIGHT/LEFT HEART CATH AND CORONARY ANGIOGRAPHY;  Surgeon: Belva Crome, MD;  Location: [facility];  Service: Cardiovascular;  Laterality: N/A;    Medications Prior to Admission  Medication Sig Dispense Refill Last Dose  . albuterol (VENTOLIN HFA) 108 (90 Base) MCG/ACT inhaler Inhale 2 puffs into the lungs every 6 (six) hours as needed for wheezing or shortness of breath.   09/16/2020 at Unknown time  . aspirin EC 81 MG tablet Take 1 tablet (81 mg total) by mouth daily with  breakfast. 30 tablet 4 Past Week at Unknown time  . Aspirin-Caffeine (BC FAST PAIN RELIEF) 845-65 MG PACK Take 1 packet by mouth daily as needed (pain).   Past Week at Unknown time  . azelastine (ASTELIN) 0.1 % nasal spray Place 2 sprays into both nostrils 2 (two) times daily. Use in each nostril as directed (Patient taking differently: Place 2 sprays into both nostrils 2 (two) times daily as needed for allergies. Use in each nostril as directed) 30 mL 12 Past Week at Unknown time  . azelastine (OPTIVAR) 0.05 % ophthalmic solution Place 1 drop into the left eye See admin instructions. Instill 1 drop into both eyes twice daily for 2 days the day of and the day after monthly eye injections   Past Week at Unknown time  . diltiazem (CARDIZEM CD) 120 MG 24 hr capsule Take 1 capsule (120 mg total) by mouth daily. 90 capsule 3 09/16/2020 at Unknown time  . Fluticasone-Umeclidin-Vilant (TRELEGY ELLIPTA) 100-62.5-25 MCG/INH AEPB Inhale 1 puff into the lungs daily.   09/16/2020 at Unknown time  . furosemide (LASIX) 40 MG tablet Take 40 mg by mouth daily as needed for edema.   09/16/2020 at Unknown time  . gabapentin (NEURONTIN) 300 MG capsule Take 100 mg by mouth 3 (three) times daily.    09/16/2020 at Unknown time  . glipiZIDE (GLUCOTROL) 10 MG tablet Take 10 mg by mouth 2 (two) times daily.   09/16/2020 at Unknown time  . hydrALAZINE (APRESOLINE) 100 MG tablet Take 100 mg by mouth 3 (three) times daily. 180 tablet 3 Past Week at Unknown time  . isosorbide mononitrate (IMDUR) 30 MG 24 hr tablet Take 1 tablet (30 mg total) by mouth daily. 30 tablet 5 09/16/2020 at Unknown time  . Multiple Vitamin (MULTIVITAMIN WITH MINERALS) TABS tablet Take 1 tablet by mouth daily.   Past Week at Unknown time  . Nebivolol HCl 20 MG TABS Take 2 tablets (40 mg total) by mouth daily. (Patient taking differently: Take 20 mg by mouth 2 (two) times daily.) 180 tablet 3 09/16/2020 at Unknown time  . olmesartan-hydrochlorothiazide (BENICAR  HCT) 40-25 MG tablet Take 1 tablet by mouth daily. 90 tablet 3 09/16/2020 at Unknown time  . rosuvastatin (CRESTOR) 40 MG tablet Take 40 mg by mouth daily.   09/16/2020 at Unknown time  . sitaGLIPtin (JANUVIA) 100 MG tablet Take 100 mg by mouth daily.   09/16/2020 at Unknown time  . tamsulosin (FLOMAX) 0.4 MG CAPS capsule Take 0.4 mg by mouth daily.   Past Week at Unknown time  . TRESIBA FLEXTOUCH 100 UNIT/ML FlexTouch Pen Inject 80 Units into the skin daily.   09/16/2020 at Unknown time  . TRULICITY 1.5 HQ/4.6NG SOPN Inject 1.5 mg into the skin every Sunday.   Past Week at Unknown time  . polyethylene glycol (MIRALAX / GLYCOLAX) 17 g packet Take 17 g by mouth daily. (Patient taking differently: Take 17 g by mouth daily as needed for moderate constipation.) 30 each 2 Unknown at Unknown time  . spironolactone (ALDACTONE) 25 MG tablet Take 0.5 tablets (12.5 mg total) by  mouth daily. (Patient not taking: No sig reported) 45 tablet 3 Not Taking at Unknown time   Allergies  Allergen Reactions  . Amlodipine     Felt "jittery" constantly  . Novolog [Insulin Aspart] Hives, Itching and Rash  . Victoza [Liraglutide] Itching and Rash    Social History   Tobacco Use  . Smoking status: Never Smoker  . Smokeless tobacco: Never Used  Substance Use Topics  . Alcohol use: No    Alcohol/week: 0.0 standard drinks    Comment: rare beer    Family History  Problem Relation Age of Onset  . Diabetes Other   . Hypertension Other   . CAD Other   . Cancer Other   . Lung cancer Mother        smoker  . Diabetes Sister   . Hypertension Sister   . Anesthesia problems Neg Hx   . Hypotension Neg Hx   . Malignant hyperthermia Neg Hx   . Pseudochol deficiency Neg Hx   . Colon cancer Neg Hx   . Colon polyps Neg Hx      Review of Systems Pertinent items noted in HPI and remainder of comprehensive ROS otherwise negative.  Objective:   Patient Vitals for the past 8 hrs:  BP Temp Temp src Pulse Resp SpO2  Height Weight  09/17/20 0632 (!) 185/78 97.9 F (36.6 C) Oral 65 20 98 % 5\' 6"  (1.676 m) 127.9 kg   No intake/output data recorded. No intake/output data recorded.      General : Alert, cooperative, no distress, appears stated age   Head:  Normocephalic/atraumatic    Eyes: PERRL, conjunctiva/corneas clear, EOM's intact. Fundi could not be visualized Neck: Supple Chest:  Respirations unlabored Chest wall: no tenderness or deformity Heart: Regular rate and rhythm Abdomen: Soft, nontender and nondistended Extremities: warm and well-perfused Skin: normal turgor, color and texture Neurologic:  Alert, oriented x 3.  Eyes open spontaneously. PERRL, EOMI, VFC, no facial droop. V1-3 intact.  No dysarthria, tongue protrusion symmetric.  CNII-XII intact. 4/5 bilateral hand grip strength, numbness in middle and medial portion of hand, R>L.  Reflexes 2+ despite diabetes.       Data ReviewRadiology review:  MRI C-spine shows severe cervical stenosis  Assessment:   This is a 70 year old man with diabetes, obesity, and a history of cocaine abuse who has cervical stenosis with myelopathy.  Plan:   Although his myelopathy is not advanced, and he mostly has hand weakness and numbness with some right leg stiffne ss, given the severe stenosis with early myelomalacia, I do recommend surgical intervention to help him maintain his functionality.  Plan is for C4-5, C5-6, and C6-7 ACDFs today.  He has significant degenerative disease with stenosis at C3-4 but in order to minimize the complexity of surgery and the recovery, we will hold of on surgery on this less severe level.  Risks, benefits, alternatives, and expected convalescence were discussed.  He wished to proceed.

## 2020-09-18 ENCOUNTER — Encounter (HOSPITAL_COMMUNITY): Payer: Self-pay | Admitting: Neurosurgery

## 2020-09-18 DIAGNOSIS — R739 Hyperglycemia, unspecified: Secondary | ICD-10-CM | POA: Diagnosis not present

## 2020-09-18 DIAGNOSIS — I10 Essential (primary) hypertension: Secondary | ICD-10-CM | POA: Diagnosis not present

## 2020-09-18 LAB — GLUCOSE, CAPILLARY
Glucose-Capillary: 411 mg/dL — ABNORMAL HIGH (ref 70–99)
Glucose-Capillary: 369 mg/dL — ABNORMAL HIGH (ref 70–99)
Glucose-Capillary: 302 mg/dL — ABNORMAL HIGH (ref 70–99)
Glucose-Capillary: 251 mg/dL — ABNORMAL HIGH (ref 70–99)
Glucose-Capillary: 185 mg/dL — ABNORMAL HIGH (ref 70–99)

## 2020-09-18 MED ORDER — INSULIN ASPART 100 UNIT/ML ~~LOC~~ SOLN
0.0000 [IU] | SUBCUTANEOUS | Status: DC
  Administered 2020-09-18: 11 [IU] via SUBCUTANEOUS
  Administered 2020-09-18: 15 [IU] via SUBCUTANEOUS
  Administered 2020-09-18 – 2020-09-19 (×3): 4 [IU] via SUBCUTANEOUS
  Administered 2020-09-19: 11 [IU] via SUBCUTANEOUS
  Administered 2020-09-19: 4 [IU] via SUBCUTANEOUS
  Administered 2020-09-19: 7 [IU] via SUBCUTANEOUS
  Administered 2020-09-20: 4 [IU] via SUBCUTANEOUS

## 2020-09-18 MED ORDER — DOCUSATE SODIUM 100 MG PO CAPS: 100.0000 mg | ORAL_CAPSULE | Freq: Two times a day (BID) | ORAL | 2 refills | Status: DC

## 2020-09-18 MED ORDER — FUROSEMIDE 10 MG/ML IJ SOLN
40.0000 mg | Freq: Once | INTRAMUSCULAR | Status: AC
  Administered 2020-09-18: 40 mg via INTRAVENOUS
  Filled 2020-09-18: qty 4

## 2020-09-18 MED ORDER — LABETALOL HCL 5 MG/ML IV SOLN
10.0000 mg | INTRAVENOUS | Status: DC | PRN
  Administered 2020-09-18 – 2020-09-19 (×4): 20 mg via INTRAVENOUS
  Filled 2020-09-18 (×4): qty 4

## 2020-09-18 MED ORDER — OXYCODONE-ACETAMINOPHEN 5-325 MG PO TABS: 1.0000 | ORAL_TABLET | Freq: Four times a day (QID) | ORAL | 0 refills | Status: DC | PRN

## 2020-09-18 NOTE — Evaluation (Signed)
Occupational Therapy Evaluation Patient Details Name: Vincent Ramos MRN: [id number] DOB: [birth year] Today's Date: 09/18/2020    History of Present Illness The pt is a 70 yo male presenting s/p C4-5, C5-6, and C6-7 ACDFs on 2/17 due to progressive issues with BUE numbness and weakness as well as some RLE issues with walking. PMH includes: DM II, HTN, COPD, CHF, and obesity.   Clinical Impression   Patient is s/p ACDF surgery resulting in functional limitations due to the deficits listed below (see OT problem list). Pt requires reeducation several times during session on cervical precautions. Pt with mild balance deficits noted that appear to be baseline body habitus related. Pt requires 1L 02 during session.  Patient will benefit from skilled OT acutely to increase independence and safety with ADLS to allow discharge outpatient follow up.     Follow Up Recommendations  Outpatient OT;Other (comment) (UE / hand)    Equipment Recommendations  None recommended by OT    Recommendations for Other Services       Precautions / Restrictions Precautions Precautions: Cervical Precaution Comments: handout from medbridge provided Required Braces or Orthoses: Cervical Brace Cervical Brace: Hard collar;At all times      Mobility Bed Mobility Overal bed mobility: Needs Assistance Bed Mobility: Supine to Sit     Supine to sit: +2 for physical assistance;Mod assist     General bed mobility comments: pt initiated well but unable to roll onto side and instead working feet off bed and unable to lift trunk. pt requires blocking anterior and lifting posterior at trunk to elevate to static sitting. pt at baseline uses recliner    Transfers Overall transfer level: Needs assistance   Transfers: Sit to/from [employer] Transfers Sit to Stand: Min guard Stand pivot transfers: Min guard       General transfer comment: first attempt total +2 min (A) with very wide bOs. pt needs cues to  narrow base. pt with widen base due to body habitus    Balance Overall balance assessment: Needs assistance Sitting-balance support: Bilateral upper extremity supported;Feet supported Sitting balance-Leahy Scale: Fair                                     ADL either performed or assessed with clinical judgement   ADL Overall ADL's : Needs assistance/impaired Eating/Feeding: Set up;Sitting Eating/Feeding Details (indicate cue type and reason): could benefit from handled cup     Upper Body Bathing: Minimal assistance   Lower Body Bathing: Maximal assistance       Lower Body Dressing: Maximal assistance       Toileting- Clothing Manipulation and Hygiene: Min guard       Functional mobility during ADLs: Min guard       Vision Baseline Vision/History: Wears glasses Wears Glasses: Reading only       Perception     Praxis      Pertinent Vitals/Pain Pain Assessment: Faces Faces Pain Scale: Hurts a little bit Pain Location: neck Pain Descriptors / Indicators: Discomfort Pain Intervention(s): Premedicated before session;Repositioned     Hand Dominance Right   Extremity/Trunk Assessment Upper Extremity Assessment Upper Extremity Assessment: RUE deficits/detail RUE Deficits / Details: needles sticking in there; decreased extension flexion and unable to tip to tip finger pads RUE Coordination: decreased fine motor   Lower Extremity Assessment Lower Extremity Assessment: Defer to PT evaluation   Cervical / Trunk Assessment Cervical / Trunk  Assessment: Other exceptions (s/p cervical)   Communication Communication Communication: No difficulties;Other (comment) (speech dialect can be difficult to understand at times)   Cognition Arousal/Alertness: Awake/alert Behavior During Therapy: WFL for tasks assessed/performed Overall Cognitive Status: Impaired/Different from baseline Area of Impairment: Awareness;Memory                     Memory:  Decreased recall of precautions     Awareness: Emergent   General Comments: pt with poor functional recall of information for cervical precautions   General Comments  dressing dry and intact at this time. Ccollar don. Pt requires 1L o2 to remain due to 85%RA    Exercises Exercises: Other exercises Other Exercises Other Exercises: medbridge- code [id number]   Shoulder Instructions      Home Living Family/patient expects to be discharged to:: Private residence Living Arrangements: Spouse/significant other Available Help at Discharge: Family;Available PRN/intermittently (8-9 hours alone, can call daughter to assist if needs does not work) Type of Home: House Home Access: Stairs to enter Technical brewer of Steps: 4 Entrance Stairs-Rails: [place]: One level     Bathroom Shower/Tub: Teacher, early years/pre: Handicapped height     Home Equipment: [place] - single point   Additional Comments: no animals, sleep in recliner for 8 years no falls      Prior Functioning/Environment Level of Independence: Independent                 OT Problem List: Decreased strength;Decreased activity tolerance;Impaired balance (sitting and/or standing);Decreased safety awareness;Decreased knowledge of use of DME or AE;Decreased knowledge of precautions;Cardiopulmonary status limiting activity;Obesity;Impaired UE functional use;Pain      OT Treatment/Interventions: Self-care/ADL training;Therapeutic exercise;Neuromuscular education;Energy conservation;DME and/or AE instruction;Manual therapy;Modalities;Therapeutic activities;Cognitive remediation/compensation;Balance training;Patient/family education    OT Goals(Current goals can be found in the care plan section) Acute Rehab OT Goals Patient Stated Goal: to get use of R arm without needles OT Goal Formulation: With patient Time For Goal Achievement: 10/02/20 Potential to Achieve Goals: Good  OT Frequency: Min  2X/week   Barriers to D/C:            Co-evaluation PT/OT/SLP Co-Evaluation/Treatment: Yes Reason for Co-Treatment: For patient/therapist safety;To address functional/ADL transfers   OT goals addressed during session: ADL's and self-care;Proper use of Adaptive equipment and DME      AM-PAC OT "6 Clicks" Daily Activity     Outcome Measure Help from another person eating meals?: A Little Help from another person taking care of personal grooming?: A Little Help from another person toileting, which includes using toliet, bedpan, or urinal?: A Little Help from another person bathing (including washing, rinsing, drying)?: A Little Help from another person to put on and taking off regular upper body clothing?: A Little Help from another person to put on and taking off regular lower body clothing?: A Little 6 Click Score: 18   End of Session Equipment Utilized During Treatment: Oxygen Nurse Communication: Mobility status;Precautions  Activity Tolerance: Patient tolerated treatment well Patient left: in chair;with call bell/phone within reach  OT Visit Diagnosis: Unsteadiness on feet (R26.81);Pain                Time: 5284-1324 OT Time Calculation (min): 53 min Charges:  OT General Charges $OT Visit: 1 Visit OT Evaluation $OT Eval Moderate Complexity: 1 Mod OT Treatments $Self Care/Home Management : 8-22 mins   Brynn, OTR/L  Acute Rehabilitation Services Pager: [phone] Office: [phone] .   Jeri Modena 09/18/2020, 11:03  AM

## 2020-09-18 NOTE — Progress Notes (Signed)
Orthopedic Tech Progress Note Patient Details:  Vincent Ramos 05-18-1951 [id number] Called in order to [facility] for an [place] Patient ID: Vincent Ramos, male   DOB: November 09, 1950, 70 y.o.   MRN: [id number]   Janit Pagan 09/18/2020, 8:24 AM

## 2020-09-18 NOTE — Evaluation (Addendum)
Physical Therapy Evaluation Patient Details Name: Vincent Ramos MRN: [id number] DOB: 07-Mar-1951 Today's Date: 09/18/2020   History of Present Illness  The pt is a 70 yo male presenting s/p C4-5, C5-6, and C6-7 ACDFs on 2/17 due to progressive issues with BUE numbness and weakness as well as some RLE issues with walking. PMH includes: DM II, HTN, COPD, CHF, and obesity.   Clinical Impression  Pt in bed upon arrival of PT, agreeable to evaluation at this time. Prior to admission the pt was independent with mobility, living at home with his wife (who works during the day), but was limited in endurance. The pt now presents with minor limitations in functional mobility, endurance, and stability due to above dx, and will continue to benefit from skilled PT to address these deficits. The pt required significant assist to complete bed mobility while maintaining cervical precautions, but was able to complete transfers, hallway ambulation, and stairs with minG and no AD. The pt moves with slowed gait, wide BOS, and shortened steps, but was able to complete without LOB or need for UE support. Pt educated on cervical precautions and given handouts for educations, but will continue to benefit from skilled PT to progress functional endurance and dynamic stability, as well as problem solving and application of cervical precautions to mobility without cues to prepare for mobility when home alone following d/c.   Session conducted on 1L O2 to maintain SpO2 in 90s. Drop to 83% with sitting EOB on RA. Maintained in 90s with mobility and stairs on 1L O2.     Follow Up Recommendations Outpatient PT;Supervision for mobility/OOB    Equipment Recommendations  None recommended by PT    Recommendations for Other Services       Precautions / Restrictions Precautions Precautions: Cervical Precaution Comments: handout from medbridge provided Required Braces or Orthoses: Cervical Brace Cervical Brace: Hard collar;At  all times      Mobility  Bed Mobility Overal bed mobility: Needs Assistance Bed Mobility: Supine to Sit     Supine to sit: +2 for physical assistance;Mod assist     General bed mobility comments: pt initiated well but unable to roll onto side and instead working feet off bed and unable to lift trunk. pt requires blocking anterior and lifting posterior at trunk to elevate to static sitting. pt at baseline uses recliner    Transfers Overall transfer level: Needs assistance Equipment used: None Transfers: Sit to/from [facility] Sit to Stand: Min guard Stand pivot transfers: Min guard       General transfer comment: first attempt total +2 min (A) with very wide bOs. pt needs cues to narrow base. pt with widen base due to body habitus  Ambulation/Gait Ambulation/Gait assistance: Min guard Gait Distance (Feet): 10 Feet (+35 ft) Assistive device: None Gait Pattern/deviations: Step-through pattern;Decreased stride length;Wide base of support Gait velocity: decreased   General Gait Details: slow but steady gait with wide stance and lateral movements but no LOB  Stairs Stairs: Yes Stairs assistance: Min guard Stair Management: One rail Left;Step to pattern;Forwards Number of Stairs: 2 (x2)    Wheelchair Mobility    Modified Rankin (Stroke Patients Only)       Balance Overall balance assessment: Needs assistance Sitting-balance support: Bilateral upper extremity supported;Feet supported Sitting balance-Leahy Scale: Fair  Pertinent Vitals/Pain Pain Assessment: No/denies pain Faces Pain Scale: Hurts a little bit Pain Location: neck Pain Descriptors / Indicators: Discomfort Pain Intervention(s): Premedicated before session;Monitored during session;Limited activity within patient's tolerance    Home Living Family/patient expects to be discharged to:: Private residence Living Arrangements:  Spouse/significant other Available Help at Discharge: Family;Available PRN/intermittently (8-9 hours alone while wife at work, can call daughter to assist if needed as she does not work) Type of Home: House Home Access: Stairs to enter Entrance Stairs-Rails: Psychiatric nurse of Steps: [place]: One level [place]: [place] - single point Additional Comments: no animals, sleep in recliner for 8 years no falls    Prior Function Level of Independence: Independent               Hand Dominance   Dominant Hand: Right    Extremity/Trunk Assessment   Upper Extremity Assessment Upper Extremity Assessment: Defer to OT evaluation RUE Deficits / Details: needles sticking in there; decreased extension flexion and unable to tip to tip finger pads RUE Coordination: decreased fine motor    Lower Extremity Assessment Lower Extremity Assessment: Generalized weakness (pt reports some RLE "stiffness" prior to admission, but no difference in strength or sensation dueing eval)    Cervical / Trunk Assessment Cervical / Trunk Assessment: Other exceptions Cervical / Trunk Exceptions: s/p cervical surgery  Communication   Communication: No difficulties (speech dialect can be difficult to understand at times)  Cognition Arousal/Alertness: Awake/alert Behavior During Therapy: WFL for tasks assessed/performed Overall Cognitive Status: Impaired/Different from baseline Area of Impairment: Awareness;Memory                     Memory: Decreased recall of precautions     Awareness: Emergent   General Comments: pt with poor functional recall of information for cervical precautions      General Comments General comments (skin integrity, edema, etc.): dressing dry and intact at this time. Ccollar don. Pt requires 1L o2 to remain due to 85%RA    Exercises Other Exercises Other Exercises: medbridge- code [id number]   Assessment/Plan    PT Assessment Patient needs  continued PT services  PT Problem List Decreased strength;Decreased activity tolerance;Decreased balance;Decreased mobility;Decreased coordination;Decreased cognition;Decreased safety awareness;Decreased knowledge of precautions;Cardiopulmonary status limiting activity       PT Treatment Interventions DME instruction;Functional mobility training;Stair training;Gait training;Therapeutic activities;Therapeutic exercise;Balance training;Patient/family education    PT Goals (Current goals can be found in the Care Plan section)  Acute Rehab PT Goals Patient Stated Goal: to get use of R arm without needles PT Goal Formulation: With patient Time For Goal Achievement: 10/02/20 Potential to Achieve Goals: Good    Frequency Min 5X/week   Barriers to discharge        Co-evaluation PT/OT/SLP Co-Evaluation/Treatment: Yes Reason for Co-Treatment: To address functional/ADL transfers;For patient/therapist safety PT goals addressed during session: Mobility/safety with mobility;Balance OT goals addressed during session: ADL's and self-care;Proper use of Adaptive equipment and DME       AM-PAC PT "6 Clicks" Mobility  Outcome Measure Help needed turning from your back to your side while in a flat bed without using bedrails?: A Little Help needed moving from lying on your back to sitting on the side of a flat bed without using bedrails?: A Little Help needed moving to and from a bed to a chair (including a wheelchair)?: A Little Help needed standing up from a chair using your arms (e.g., wheelchair or bedside chair)?: A Little Help needed to  walk in hospital room?: A Little Help needed climbing 3-5 steps with a railing? : A Little 6 Click Score: 18    End of Session Equipment Utilized During Treatment: Gait belt;Cervical collar;Oxygen Activity Tolerance: Patient tolerated treatment well Patient left: in chair;with call bell/phone within reach Nurse Communication: Mobility status PT Visit  Diagnosis: Unsteadiness on feet (R26.81);Other abnormalities of gait and mobility (R26.89)    Time: 9166-0600 PT Time Calculation (min) (ACUTE ONLY): 35 min   Charges:   PT Evaluation $PT Eval Low Complexity: 1 Low          Karma Ganja, PT, DPT   Acute Rehabilitation Department Pager #: [phone]  Otho Bellows 09/18/2020, 12:56 PM

## 2020-09-18 NOTE — Progress Notes (Signed)
Subjective: Patient reports no significant difficulty swallow, some improvement in hand numbness that he had preop Objective: Vital signs in last 24 hours: Temp:  [98 F (36.7 C)-99.3 F (37.4 C)] 98 F (36.7 C) (02/18 0400) Pulse Rate:  [81-97] 86 (02/18 0700) Resp:  [12-37] 24 (02/18 0700) BP: (123-244)/(60-106) 163/79 (02/18 0700) SpO2:  [92 %-100 %] 97 % (02/18 0806)  Intake/Output from previous day: 02/17 0701 - 02/18 0700 In: 5076.5 [P.O.:1200; I.V.:3396.3; IV Piggyback:480.2] Out: 2181 [Urine:2065; Drains:66; Blood:50] Intake/Output this shift: No intake/output data recorded.  NAD Obese 4/5 strength bilateral hand grip, 4/5 strength in LEs  Lab Results: Recent Labs    09/15/20 0917 09/17/20 1509 09/17/20 1513  WBC 6.6 9.8  --   HGB 15.6 16.1 17.0  HCT 49.5 51.0 50.0  PLT 134* 121*  --    BMET Recent Labs    09/15/20 0917 09/17/20 1509 09/17/20 1513  NA 137 142 144  K 3.5 3.5 3.5  CL 101 105  --   CO2 26 27  --   GLUCOSE 146* 93  --   BUN 13 16  --   CREATININE 1.20 1.38*  --   CALCIUM 9.4 9.2  --     Studies/Results: DG Cervical Spine 2-3 Views  Result Date: 09/17/2020 CLINICAL DATA:  Surgery, elective. Additional history provided: Anterior cervical decompression/discectomy fusion cervical 4-5, cervical 5-6, cervical 6-7. Provided fluoroscopy time 56 seconds (36.93 mGy). EXAM: CERVICAL SPINE - 2-3 VIEW; DG C-ARM 1-60 MIN COMPARISON:  MRI of the cervical spine 09/18/2019. FINDINGS: PA and lateral view intraoperative fluoroscopic images of the cervical spine are submitted, 3 images total. The images demonstrate sequela of interval C4-C7 ACDF. A ventral plate and screws are present at the C4-C6 levels. A separate ventral plate and screws are present at the C6-C7 levels. Interbody devices are now present at C4-C5, C5-C6 and C6-C7. Curvilinear hyperdensity projects anterolateral to the C4-C6 levels, likely reflecting a surgical sponge or packing material.  Partially visualized ET tube. IMPRESSION: 3 intraoperative fluoroscopic images of the cervical spine from interval C4-C7 ACDF, as described. Surgical sponge or packing material anterolateral to the C4-C6 levels. Correlate with the operative history. Electronically Signed   By: Kellie Simmering DO   On: 09/17/2020 13:46   DG C-Arm 1-60 Min  Result Date: 09/17/2020 CLINICAL DATA:  Surgery, elective. Additional history provided: Anterior cervical decompression/discectomy fusion cervical 4-5, cervical 5-6, cervical 6-7. Provided fluoroscopy time 56 seconds (36.93 mGy). EXAM: CERVICAL SPINE - 2-3 VIEW; DG C-ARM 1-60 MIN COMPARISON:  MRI of the cervical spine 09/18/2019. FINDINGS: PA and lateral view intraoperative fluoroscopic images of the cervical spine are submitted, 3 images total. The images demonstrate sequela of interval C4-C7 ACDF. A ventral plate and screws are present at the C4-C6 levels. A separate ventral plate and screws are present at the C6-C7 levels. Interbody devices are now present at C4-C5, C5-C6 and C6-C7. Curvilinear hyperdensity projects anterolateral to the C4-C6 levels, likely reflecting a surgical sponge or packing material. Partially visualized ET tube. IMPRESSION: 3 intraoperative fluoroscopic images of the cervical spine from interval C4-C7 ACDF, as described. Surgical sponge or packing material anterolateral to the C4-C6 levels. Correlate with the operative history. Electronically Signed   By: Kellie Simmering DO   On: 09/17/2020 13:46    Assessment/Plan: S/p 3 lvl ACDF - DM control - mobilize with PT - possible DC later this afternoon   Vallarie Mare 09/18/2020, 8:34 AM   \

## 2020-09-18 NOTE — Progress Notes (Signed)
Called MD Duffy Rhody to inform of CBG 411. Verbal order received to place sliding scale insulin. Discussed parameters for MAP- change from 70-90 to 70-100.

## 2020-09-18 NOTE — Plan of Care (Signed)
  Problem: Activity: Goal: Ability to tolerate increased activity will improve Outcome: Completed/Met   Problem: Nutrition: Goal: Maintenance of adequate nutrition will improve Outcome: Completed/Met

## 2020-09-18 NOTE — TOC Transition Note (Signed)
Transition of Care [facility]) - CM/SW Discharge Note   Patient Details  Name: Vincent Ramos MRN: [id number] Date of Birth: [birth year]  [facility] CM/SW Contact:  Benard Halsted, LCSW Phone Number: 09/18/2020, 3:32 PM   Clinical Narrative:    CSW sent referral for OP Rehab at [facility]. No other needs identified.    Final next level of care: OP Rehab Barriers to Discharge: No Barriers Identified   Patient Goals and CMS Choice Patient states their goals for this hospitalization and ongoing recovery are:: Rehab      Discharge Placement                       Discharge Plan and Services   Discharge Planning Services: CM Consult                                 Social Determinants of Health (SDOH) Interventions     Readmission Risk Interventions No flowsheet data found.

## 2020-09-18 NOTE — Discharge Instructions (Addendum)
Can remove transparent dressing and shower 48 hours after surgery Walk as much as possible No heavy lifting >10 lbs No excessive bending/twisting at the neck Wear C-collar when out of bed Resume aspirin on 09/20/2020

## 2020-09-18 NOTE — Consult Note (Addendum)
NAME:  Vincent Ramos, MRN:  [id number], DOB:  [birth year], LOS: 1 ADMISSION DATE:  09/17/2020, CONSULTATION DATE: 09/19/19 REFERRING MD:  Dr. Marcello Moores, CHIEF COMPLAINT:  UE weakness   Brief History:  70 y/o M, admitted 2/17 for planned cervical decompression on 2/18.  Post-operative course complicated by hypertension and hyperglycemia in the setting of medical non-compliance.   History of Present Illness:  70 y/o M who presented to [facility] on 2/17 with reports of bilateral UE numbness / weakness and RLE stiffness.   The patient has been followed by Dr. Marcello Moores in the neurosurgery clinic with reports of a 10 month hx of hand numbness and weakness. He also reported RLE stiffness making it difficult to walk occasionally. Symptoms were resistant to non-surgical interventions.  He was last seen on 2/17 in the clinic and recommended for surgical intervention and was admitted for planned intervention.  The patient underwent C4-5 arthrodesis with discectomy for decompression of spinal cord, arthrodesis of C5-6 with discectomy for decompression of spinal cord, arthrodesis of C6-7 with discectomy, placement of anterior plate & screws J6-2, C5-6, C6-7.  He was returned to 4N post procedure extubated for observation.  He was noted to be hyperglycemic and hypertensive post-operatively. The patient had refused insulin in the post op period.  He did take his blood pressure medications that were ordered. However, continued to require cleviprex gtt.  States he does not take insulin at home.   PCCM consulted to evaluate blood pressure and hyperglycemia.  Chart review shows he has been on cleviprex since surgery on 2/17.  Received cardizem 120mg  CD, irbesartan 300mg , HCTZ 25mg , imdur 30mg , bystolic 20mg  on 2/18  Past Medical History:  HTN DM II  CAD  Chronic Combined Systolic & Diastolic CHF  Chronic Dyspnea Cocaine Abuse  Asthma - mild persistent, previously followed by Dr. Carlis Abbott Dysphagia  BPH  Laminectomy -  L4-L5, 09/2015  Significant Hospital Events:  2/17 Admit  2/18 Cervical decompression surgery   Consults:    Procedures:    Significant Diagnostic Tests:   MRI Cervical Spine 2/17 >> multilevel degenerative disease with reversal of cervical lordosis and multilevel listhesis, spinal stenosis with cord compression from C4-5, C6-7, severe at C5-6 where cord signal is abnormal, high grade foraminal impingement at C4-5 to C6-7 bilaterally and at C7-T1 on the right  ECHO 2/18 >> LV mildly dilated, severe LVH, LVEF 60-65%  Micro Data:  COVID 2/15 >> negative  MRSA PCR 2/15 >> negative   Antimicrobials:  Ancef (intra-op) 2/18   Interim History / Subjective:  RN reports pt refused insulin dosing this am > pt stated "I don't take it at home" Took BP agents this am  Remains on cleviprex   Objective   Blood pressure (!) 167/77, pulse 87, temperature 98.7 F (37.1 C), temperature source Oral, resp. rate 19, height 5\' 6"  (1.676 m), weight 127.9 kg, SpO2 95 %.        Intake/Output Summary (Last 24 hours) at 09/18/2020 1441 Last data filed at 09/18/2020 1400 Gross per 24 hour  Intake 3076.54 ml  Output 1961 ml  Net 1115.54 ml   Filed Weights   09/17/20 8315  Weight: 127.9 kg    Examination: General: obese adult male sitting up in chair in NAD HEENT: MM pink/moist, cervical collar in place  Neuro: awake, alert / oriented, MAE, normal strength CV: s1s2 RRR, SR 80's on monitor, no m/r/g PULM: non-labored at rest, speaking full sentences, lungs clear bilaterally  GI: soft, bsx4 active  Extremities: warm/dry, 1-2+ pitting BLE edema  Skin: small abrasion on anterior LLE, anterior surgical dressing visualized under cervical collar intact / clean  Resolved Hospital Problem list      Assessment & Plan:   BUE Weakness / Numbness in setting of Cervical Cord Compression  S/p decompression with placement of hardware 2/18 -post operative / pain control recommendations per NSGY  -PT  efforts / mobilize   HTN  CAD Chronic Combined CHF  ECHO with severe LVH, EF 60-65% He has a documented longstanding, poorly controlled HTN. Last office visit 09/2019 to Pulmonary with BP 160/70. He has been documented in the primary office with systolic pressures as high as 202 and that he "only takes certain medications at certain times".  Previously was on clonidine. He has gained ~ 20lbs since that visit as well. Not compliant with sleep recommendations for OSA.   -continue cardizem, hydralazine, irbesartan, HCTZ, imdur, bystolic -lasix 40 mg IV x1 now  -liberalize labetalol dosing  -wean cleviprex off for MAP 70-100  -outpatient weight loss   Hyperglycemia in setting of DM II  Hgb A1c 7.7 on admit. Current glucose range 302-411  -continue glipizide, linagliptin  -80 units QD lantus (first dose this am) -SSI > due for 15 units now, this will be his first dose as he has refused during admit  -follow CBG    OSA  Likely contributing to above, states he was told he doesn't need to wear sleep machine. Sleep study from 2019 would suggest otherwise -outpatient follow up for sleep it pt willing to participate   Best practice (evaluated daily)  Diet: as tolerated  Pain/Anxiety/Delirium protocol (if indicated): per primary  VAP protocol (if indicated): n/a DVT prophylaxis: SCD's, ambulatory  GI prophylaxis: PPI Glucose control: SSI, lantus + oral agents  Mobility: as tolerated  Disposition: per NSGY   Goals of Care:  Last date of multidisciplinary goals of care discussion:n/a Family and staff present:  Summary of discussion:  Follow up goals of care discussion due:  Code Status: Full Code   Labs   CBC: Recent Labs  Lab 09/15/20 0917 09/17/20 1509 09/17/20 1513  WBC 6.6 9.8  --   HGB 15.6 16.1 17.0  HCT 49.5 51.0 50.0  MCV 86.5 85.6  --   PLT 134* 121*  --     Basic Metabolic Panel: Recent Labs  Lab 09/15/20 0917 09/17/20 1509 09/17/20 1513  NA 137 142 144  K 3.5  3.5 3.5  CL 101 105  --   CO2 26 27  --   GLUCOSE 146* 93  --   BUN 13 16  --   CREATININE 1.20 1.38*  --   CALCIUM 9.4 9.2  --    GFR: Estimated Creatinine Clearance: 65.7 mL/min (A) (by C-G formula based on SCr of 1.38 mg/dL (H)). Recent Labs  Lab 09/15/20 0917 09/17/20 1509  WBC 6.6 9.8    Liver Function Tests: No results for input(s): AST, ALT, ALKPHOS, BILITOT, PROT, ALBUMIN in the last 168 hours. No results for input(s): LIPASE, AMYLASE in the last 168 hours. No results for input(s): AMMONIA in the last 168 hours.  ABG    Component Value Date/Time   PHART 7.368 09/17/2020 1513   PCO2ART 49.2 (H) 09/17/2020 1513   PO2ART 69 (L) 09/17/2020 1513   HCO3 28.3 (H) 09/17/2020 1513   TCO2 30 09/17/2020 1513   O2SAT 93.0 09/17/2020 1513     Coagulation Profile: No results for input(s): INR, PROTIME in the  last 168 hours.  Cardiac Enzymes: No results for input(s): CKTOTAL, CKMB, CKMBINDEX, TROPONINI in the last 168 hours.  HbA1C: Hgb A1c MFr Bld  Date/Time Value Ref Range Status  09/15/2020 09:16 AM 7.7 (H) 4.8 - 5.6 % Final    Comment:    (NOTE)         Prediabetes: 5.7 - 6.4         Diabetes: >6.4         Glycemic control for adults with diabetes: <7.0   05/10/2019 04:46 AM 8.6 (H) 4.8 - 5.6 % Final    Comment:    (NOTE) Pre diabetes:          5.7%-6.4% Diabetes:              >6.4% Glycemic control for   <7.0% adults with diabetes     CBG: Recent Labs  Lab 09/17/20 0629 09/17/20 1405 09/17/20 1424 09/18/20 0754 09/18/20 1143  GLUCAP 92 76 83 411* 369*    Review of Systems: Positives in [place]  Gen: Denies fever, chills, weight change, fatigue, night sweats HEENT: Denies blurred vision, double vision, hearing loss, tinnitus, sinus congestion, rhinorrhea, sore throat, neck stiffness, dysphagia PULM: Denies shortness of breath, cough, sputum production, hemoptysis, wheezing CV: Denies chest pain, edema, orthopnea, paroxysmal nocturnal dyspnea,  palpitations GI: Denies abdominal pain, nausea, vomiting, diarrhea, hematochezia, melena, constipation, change in bowel habits GU: Denies dysuria, hematuria, polyuria, oliguria, urethral discharge Endocrine: Denies hot or cold intolerance, polyuria, polyphagia or appetite change Derm: Denies rash, dry skin, scaling or peeling skin change Heme: Denies easy bruising, bleeding, bleeding gums Neuro: Denies headache, numbness, BUE weakness prior to admit, slurred speech, loss of memory or consciousness  Past Medical History:  He,  has a past medical history of Abnormal nuclear cardiac imaging test (12/27/2017), Acute on chronic diastolic CHF (congestive heart failure) ([place]) (09/17/2017), Acute respiratory failure with hypoxia ([facility]) (06/01/2017), Arthritis, Asthma, CAD (coronary artery disease), CAP (community acquired pneumonia) (07/31/2014), Community acquired pneumonia (07/31/2014), Constipation (02/06/2018), COPD exacerbation ([place]) (09/17/2017), Dysphagia, Elevated troponin (09/17/2017), Enlarged prostate, Grade II diastolic dysfunction, Hypertension, Lumbar stenosis (10/07/2015), Obesity (07/31/2014), PNA (pneumonia) (05/30/2017), Pneumonia, Pneumonia, Polyp of rectum, Respiratory failure ([place]) (09/18/2017), SBO (small bowel obstruction) ([place]) (05/08/2019), Shortness of breath, Sleep apnea, and Type 2 diabetes mellitus ([place]).   Surgical History:   Past Surgical History:  Procedure Laterality Date  . CIRCUMCISION    . COLONOSCOPY WITH PROPOFOL N/A 08/08/2017   Procedure: COLONOSCOPY WITH PROPOFOL;  Surgeon: Danie Binder, MD;  [facility];  Service: Endoscopy;  Laterality: N/A;  1:00pm  . ESOPHAGEAL DILATION N/A 06/07/2017   Procedure: ESOPHAGEAL DILATION;  Surgeon: Daneil Dolin, MD;  Location: [facility];  Service: Endoscopy;  Laterality: N/A;  . ESOPHAGOGASTRODUODENOSCOPY (EGD) WITH PROPOFOL N/A 06/07/2017   Procedure: ESOPHAGOGASTRODUODENOSCOPY (EGD) WITH PROPOFOL;  Surgeon: Daneil Dolin, MD;  [facility]: [facility];  Service: Endoscopy;  Laterality: N/A;  . HYDROCELE EXCISION  10/14/2011   Procedure: HYDROCELECTOMY ADULT;  Surgeon: Malka So, MD;  Location: [facility];  Service: Urology;  Laterality: Right;  . LACERATION REPAIR     left hand  . LUMBAR LAMINECTOMY/DECOMPRESSION MICRODISCECTOMY Right 10/07/2015   Procedure: Right Lumbar four-five ,lumbar five sacral-one Laminectomy;  Surgeon: Leeroy Cha, MD;  Location: [facility];  Service: Neurosurgery;  Laterality: Right;  . RIGHT/LEFT HEART CATH AND CORONARY ANGIOGRAPHY N/A 12/27/2017   Procedure: RIGHT/LEFT HEART CATH AND CORONARY ANGIOGRAPHY;  Surgeon: Belva Crome, MD;  Location: [facility];  Service: Cardiovascular;  Laterality: N/A;     Social History:   reports that he has never smoked. He has never used smokeless tobacco. He reports that he does not drink alcohol and does not use drugs.   Family History:  His family history includes CAD in an other family member; Cancer in an other family member; Diabetes in his sister and another family member; Hypertension in his sister and another family member; Lung cancer in his mother. There is no history of Anesthesia problems, Hypotension, Malignant hyperthermia, Pseudochol deficiency, Colon cancer, or Colon polyps.   Allergies Allergies  Allergen Reactions  . Amlodipine     Felt "jittery" constantly  . Novolog [Insulin Aspart] Hives, Itching and Rash  . Victoza [Liraglutide] Itching and Rash     Home Medications  Prior to Admission medications   Medication Sig Start Date End Date Taking? Authorizing Provider  albuterol (VENTOLIN HFA) 108 (90 Base) MCG/ACT inhaler Inhale 2 puffs into the lungs every 6 (six) hours as needed for wheezing or shortness of breath.   Yes [provider]  aspirin EC 81 MG tablet Take 1 tablet (81 mg total) by mouth daily with breakfast. 05/12/19  Yes Emokpae, Courage, MD  Aspirin-Caffeine (BC FAST PAIN RELIEF) 845-65 MG  PACK Take 1 packet by mouth daily as needed (pain).   Yes [provider]  azelastine (ASTELIN) 0.1 % nasal spray Place 2 sprays into both nostrils 2 (two) times daily. Use in each nostril as directed Patient taking differently: Place 2 sprays into both nostrils 2 (two) times daily as needed for allergies. Use in each nostril as directed 10/15/19  Yes Carlis Abbott, Mickel Baas P, DO  azelastine (OPTIVAR) 0.05 % ophthalmic solution Place 1 drop into the left eye See admin instructions. Instill 1 drop into both eyes twice daily for 2 days the day of and the day after monthly eye injections 12/09/19  Yes [provider]  diltiazem (CARDIZEM CD) 120 MG 24 hr capsule Take 1 capsule (120 mg total) by mouth daily. 11/11/19  Yes Jettie Booze, MD  Fluticasone-Umeclidin-Vilant (TRELEGY ELLIPTA) 100-62.5-25 MCG/INH AEPB Inhale 1 puff into the lungs daily.   Yes [provider]  furosemide (LASIX) 40 MG tablet Take 40 mg by mouth daily as needed for edema.   Yes [provider]  gabapentin (NEURONTIN) 300 MG capsule Take 100 mg by mouth 3 (three) times daily.  09/25/19  Yes [provider]  glipiZIDE (GLUCOTROL) 10 MG tablet Take 10 mg by mouth 2 (two) times daily. 07/04/20  Yes [provider]  hydrALAZINE (APRESOLINE) 100 MG tablet Take 100 mg by mouth 3 (three) times daily. 12/23/19  Yes Maccia, Melissa D, RPH-CPP  isosorbide mononitrate (IMDUR) 30 MG 24 hr tablet Take 1 tablet (30 mg total) by mouth daily. 05/13/19  Yes Roxan Hockey, MD  Multiple Vitamin (MULTIVITAMIN WITH MINERALS) TABS tablet Take 1 tablet by mouth daily.   Yes [provider]  Nebivolol HCl 20 MG TABS Take 2 tablets (40 mg total) by mouth daily. Patient taking differently: Take 20 mg by mouth 2 (two) times daily. 02/14/20  Yes Jettie Booze, MD  olmesartan-hydrochlorothiazide (BENICAR HCT) 40-25 MG tablet Take 1 tablet by mouth daily. 11/25/19  Yes Jettie Booze, MD   rosuvastatin (CRESTOR) 40 MG tablet Take 40 mg by mouth daily.   Yes [provider]  sitaGLIPtin (JANUVIA) 100 MG tablet Take 100 mg by mouth daily.   Yes  [provider]  tamsulosin (FLOMAX) 0.4 MG CAPS capsule Take 0.4 mg by mouth daily. 09/04/19  Yes [provider]  TRESIBA FLEXTOUCH 100 UNIT/ML FlexTouch Pen Inject 80 Units into the skin daily. 08/11/20  Yes [provider]  TRULICITY 1.5 TH/4.3OO SOPN Inject 1.5 mg into the skin every Sunday. 04/12/17  Yes [provider]  docusate sodium (COLACE) 100 MG capsule Take 1 capsule (100 mg total) by mouth 2 (two) times daily. 09/18/20   Vallarie Mare, MD  oxyCODONE-acetaminophen (PERCOCET/ROXICET) 5-325 MG tablet Take 1-2 tablets by mouth every 6 (six) hours as needed for moderate pain or severe pain. 09/18/20   Vallarie Mare, MD  polyethylene glycol (MIRALAX / GLYCOLAX) 17 g packet Take 17 g by mouth daily. Patient taking differently: Take 17 g by mouth daily as needed for moderate constipation. 05/12/19   Roxan Hockey, MD  spironolactone (ALDACTONE) 25 MG tablet Take 0.5 tablets (12.5 mg total) by mouth daily. Patient not taking: No sig reported 02/14/20   Jettie Booze, MD     Critical care time: n/a     Noe Gens, MSN, APRN, NP-C, AGACNP-BC [facility] 09/18/2020, 2:41 PM   Please see [email or web address] for pager details.   From 7A-7P if no response, please call [phone] After hours, please call ELink [phone]

## 2020-09-19 DIAGNOSIS — R739 Hyperglycemia, unspecified: Secondary | ICD-10-CM | POA: Diagnosis not present

## 2020-09-19 DIAGNOSIS — I10 Essential (primary) hypertension: Secondary | ICD-10-CM | POA: Diagnosis not present

## 2020-09-19 LAB — GLUCOSE, CAPILLARY
Glucose-Capillary: 257 mg/dL — ABNORMAL HIGH (ref 70–99)
Glucose-Capillary: 185 mg/dL — ABNORMAL HIGH (ref 70–99)
Glucose-Capillary: 195 mg/dL — ABNORMAL HIGH (ref 70–99)
Glucose-Capillary: 226 mg/dL — ABNORMAL HIGH (ref 70–99)
Glucose-Capillary: 169 mg/dL — ABNORMAL HIGH (ref 70–99)

## 2020-09-19 LAB — POCT I-STAT 7, (LYTES, BLD GAS, ICA,H+H)
Acid-Base Excess: 5 mmol/L — ABNORMAL HIGH (ref 0.0–2.0)
Bicarbonate: 29.8 mmol/L — ABNORMAL HIGH (ref 20.0–28.0)
Calcium, Ion: 1.17 mmol/L (ref 1.15–1.40)
HCT: 45 % (ref 39.0–52.0)
Hemoglobin: 15.3 g/dL (ref 13.0–17.0)
O2 Saturation: 91 %
Patient temperature: 98
Potassium: 3.6 mmol/L (ref 3.5–5.1)
Sodium: 135 mmol/L (ref 135–145)
TCO2: 31 mmol/L (ref 22–32)
pCO2 arterial: 43.8 mmHg (ref 32.0–48.0)
pH, Arterial: 7.438 (ref 7.350–7.450)
pO2, Arterial: 59 mmHg — ABNORMAL LOW (ref 83.0–108.0)

## 2020-09-19 MED ORDER — ENALAPRILAT 1.25 MG/ML IV SOLN
1.2500 mg | Freq: Four times a day (QID) | INTRAVENOUS | Status: DC | PRN
  Filled 2020-09-19: qty 1

## 2020-09-19 MED ORDER — DILTIAZEM HCL ER COATED BEADS 360 MG PO CP24
360.0000 mg | ORAL_CAPSULE | Freq: Every day | ORAL | Status: DC
  Administered 2020-09-20: 360 mg via ORAL
  Filled 2020-09-19: qty 1

## 2020-09-19 MED ORDER — LABETALOL HCL 5 MG/ML IV SOLN
10.0000 mg | INTRAVENOUS | Status: DC | PRN
  Administered 2020-09-19: 20 mg via INTRAVENOUS
  Filled 2020-09-19: qty 4

## 2020-09-19 MED ORDER — HYDRALAZINE HCL 20 MG/ML IJ SOLN: 10.0000 mg | INTRAMUSCULAR | Status: DC | PRN

## 2020-09-19 MED ORDER — ISOSORBIDE MONONITRATE ER 60 MG PO TB24
120.0000 mg | ORAL_TABLET | Freq: Every day | ORAL | Status: DC
  Administered 2020-09-20: 120 mg via ORAL
  Filled 2020-09-19: qty 2

## 2020-09-19 MED ORDER — DILTIAZEM HCL ER COATED BEADS 360 MG PO CP24: 360.0000 mg | ORAL_CAPSULE | Freq: Every day | ORAL | 30 refills | Status: DC

## 2020-09-19 NOTE — Progress Notes (Signed)
Physical Therapy Treatment Patient Details Name: Vincent Ramos MRN: [id number] DOB: 10-28-1950 Today's Date: 09/19/2020    History of Present Illness The pt is a 70 yo male presenting s/p C4-5, C5-6, and C6-7 ACDFs on 2/17 due to progressive issues with BUE numbness and weakness as well as some RLE issues with walking. PMH includes: DM II, HTN, COPD, CHF, and obesity.    PT Comments    The pt was able to make good progress with mobility and stability during today's session. Tolerating significantly improved hallway ambulation distances with less assist and RA only. Pt did have slight drop in SpO2 to 86%, but was able to recover to 88 and then 94% with guided breathing. Pt educated on self-monitoring, especially with activity, and verbalized understanding. The pt was able to demo good understanding of fall risk prevention while mobilizing at home, and was able to maintain cervical precautions during mobility without cues. PT will continue to benefit from skilled PT to progress functional activity tolerance and endurance to facilitate return to safety with mobility and to progress towards pt goal of being able to walk a mile with his friends at the park.     Follow Up Recommendations  Outpatient PT;Supervision for mobility/OOB     Equipment Recommendations  None recommended by PT    Recommendations for Other Services       Precautions / Restrictions Precautions Precautions: Cervical Precaution Comments: handout from medbridge provided Required Braces or Orthoses: Cervical Brace Cervical Brace: Hard collar;At all times Restrictions Weight Bearing Restrictions: No    Mobility  Bed Mobility Overal bed mobility: Needs Assistance             General bed mobility comments: pt OOB in recliner at start and end of session    Transfers Overall transfer level: Needs assistance Equipment used: None Transfers: Sit to/from Stand Sit to Stand: Supervision         General  transfer comment: pt able to complete wihtout assist, line management only with good control of power up and lower  Ambulation/Gait Ambulation/Gait assistance: Min guard Gait Distance (Feet): 120 Feet Assistive device: Straight cane Gait Pattern/deviations: Step-through pattern;Decreased stride length;Wide base of support Gait velocity: 0.57 m/s Gait velocity interpretation: <1.8 ft/sec, indicate of risk for recurrent falls General Gait Details: slow but steady gait with wide stance and lateral movements but no LOB       Balance Overall balance assessment: Needs assistance Sitting-balance support: No upper extremity supported Sitting balance-Leahy Scale: Fair     Standing balance support: Single extremity supported;During functional activity Standing balance-Leahy Scale: Fair Standing balance comment: able to sustain static stance without UE support, and some dynamic stance. single UE support for improved independence                            Cognition Arousal/Alertness: Awake/alert Behavior During Therapy: WFL for tasks assessed/performed Overall Cognitive Status: Impaired/Different from baseline Area of Impairment: Memory;Awareness                     Memory: Decreased recall of precautions     Awareness: Emergent   General Comments: pt required cues and prompts to recall precautions from yesterdays session. able to voice some safety measures to reduce fall risk at home      Exercises Other Exercises Other Exercises: pt practiced retrieving items from the ground without breaking cervical precautions x4.    General Comments General comments (  skin integrity, edema, etc.): dressing dry and intact at this time. Ccollar donned. SpO2 dropped to 86% with RA and ambulation, able to recover to 88% with guided breathing and returned to 90s with continued ambulation back to room. Discussed monitoring at length with pt      Pertinent Vitals/Pain Pain  Assessment: No/denies pain Pain Intervention(s): Monitored during session;Limited activity within patient's tolerance           PT Goals (current goals can now be found in the care plan section) Acute Rehab PT Goals Patient Stated Goal: to get back to walking 1 mile a day PT Goal Formulation: With patient Time For Goal Achievement: 10/02/20 Potential to Achieve Goals: Good Progress towards PT goals: Progressing toward goals    Frequency    Min 5X/week      PT Plan Current plan remains appropriate       AM-PAC PT "6 Clicks" Mobility   Outcome Measure  Help needed turning from your back to your side while in a flat bed without using bedrails?: A Little Help needed moving from lying on your back to sitting on the side of a flat bed without using bedrails?: A Little Help needed moving to and from a bed to a chair (including a wheelchair)?: A Little Help needed standing up from a chair using your arms (e.g., wheelchair or bedside chair)?: A Little Help needed to walk in hospital room?: A Little Help needed climbing 3-5 steps with a railing? : A Little 6 Click Score: 18    End of Session Equipment Utilized During Treatment: Gait belt;Cervical collar Activity Tolerance: Patient tolerated treatment well Patient left: in chair;with call bell/phone within reach Nurse Communication: Mobility status PT Visit Diagnosis: Unsteadiness on feet (R26.81);Other abnormalities of gait and mobility (R26.89)     Time: 7116-5790 PT Time Calculation (min) (ACUTE ONLY): 23 min  Charges:  $Gait Training: 8-22 mins $Self Care/Home Management: 8-22                     Karma Ganja, PT, DPT   Acute Rehabilitation Department Pager #: [phone]   Otho Bellows 09/19/2020, 9:00 AM

## 2020-09-19 NOTE — Progress Notes (Signed)
OT Cancellation Note  Patient Details Name: Vincent Ramos MRN: [id number] DOB: Sep 04, 1950   Cancelled Treatment:    Reason Eval/Treat Not Completed: Other (comment) (Awaiting wife to arrive for family education. Wife still enroute. Pt also not in room. Transfering to [place].) Will return as schedule allows. Thank you.  Malcolm, OTR/L Acute Rehab Pager: [phone] Office: [phone] 09/19/2020, 5:02 PM

## 2020-09-19 NOTE — Consult Note (Signed)
NAME:  Vincent Ramos, MRN:  [id number], DOB:  05/25/1951, LOS: 2 ADMISSION DATE:  09/17/2020, CONSULTATION DATE: 09/19/19 REFERRING MD:  Dr. Marcello Moores, CHIEF COMPLAINT:  UE weakness   Brief History:  70 y/o M, admitted 2/17 for planned cervical decompression on 2/18.  Post-operative course complicated by hypertension and hyperglycemia in the setting of medical non-compliance.   History of Present Illness:  70 y/o M who presented to [facility] on 2/17 with reports of bilateral UE numbness / weakness and RLE stiffness.   The patient has been followed by Dr. Marcello Moores in the neurosurgery clinic with reports of a 10 month hx of hand numbness and weakness. He also reported RLE stiffness making it difficult to walk occasionally. Symptoms were resistant to non-surgical interventions.  He was last seen on 2/17 in the clinic and recommended for surgical intervention and was admitted for planned intervention.  The patient underwent C4-5 arthrodesis with discectomy for decompression of spinal cord, arthrodesis of C5-6 with discectomy for decompression of spinal cord, arthrodesis of C6-7 with discectomy, placement of anterior plate & screws J6-7, C5-6, C6-7.  He was returned to 4N post procedure extubated for observation.  He was noted to be hyperglycemic and hypertensive post-operatively. The patient had refused insulin in the post op period.  He did take his blood pressure medications that were ordered. However, continued to require cleviprex gtt.  States he does not take insulin at home.   PCCM consulted to evaluate blood pressure and hyperglycemia.  Chart review shows he has been on cleviprex since surgery on 2/17.  Received cardizem 120mg  CD, irbesartan 300mg , HCTZ 25mg , imdur 30mg , bystolic 20mg  on 2/18  Past Medical History:  HTN DM II  CAD  Chronic Combined Systolic & Diastolic CHF  Chronic Dyspnea Cocaine Abuse  Asthma - mild persistent, previously followed by Dr. Carlis Abbott Dysphagia  BPH  Laminectomy -  L4-L5, 09/2015  Significant Hospital Events:  2/17 Admit  2/18 Cervical decompression surgery   Consults:    Procedures:    Significant Diagnostic Tests:   MRI Cervical Spine 2/17 >> multilevel degenerative disease with reversal of cervical lordosis and multilevel listhesis, spinal stenosis with cord compression from C4-5, C6-7, severe at C5-6 where cord signal is abnormal, high grade foraminal impingement at C4-5 to C6-7 bilaterally and at C7-T1 on the right  ECHO 2/18 >> LV mildly dilated, severe LVH, LVEF 60-65%  Micro Data:  COVID 2/15 >> negative  MRSA PCR 2/15 >> negative   Antimicrobials:  Ancef (intra-op) 2/18   Interim History / Subjective:   Complains of mild to moderate operative pain. Blood sugar control has improved with resumption of home oral hypoglycemics. Has been ambulating. Remains hypertensive on decreasing dose of Cleviprex  Objective   Blood pressure (!) 153/81, pulse 77, temperature 98.4 F (36.9 C), temperature source Oral, resp. rate (!) 21, height 5\' 6"  (1.676 m), weight 127.9 kg, SpO2 96 %.        Intake/Output Summary (Last 24 hours) at 09/19/2020 1304 Last data filed at 09/19/2020 1200 Gross per 24 hour  Intake 143.37 ml  Output 2900 ml  Net -2756.63 ml   Filed Weights   09/17/20 3419  Weight: 127.9 kg    Examination: General: obese adult male sitting up in chair in NAD HEENT: MM pink/moist, cervical collar in place  Neuro: awake, alert / oriented, MAE, normal strength CV: s1s2 RRR, SR 80's on monitor, no m/r/g PULM: non-labored at rest, speaking full sentences, lungs clear bilaterally  GI:  soft, bsx4 active  Extremities: warm/dry, 1-2+ pitting BLE edema  Skin: small abrasion on anterior LLE, anterior surgical dressing visualized under cervical collar intact / clean  Resolved Hospital Problem list      Assessment & Plan:   BUE Weakness / Numbness in setting of Cervical Cord Compression  S/p decompression with placement of  hardware 2/18 HTN  CAD Chronic Combined CHF ECHO with severe LVH, EF 60-65% Hyperglycemia in setting of DM II Hgb A1c 7.7 on admit.  OSA treatment at home Questionable adherence to therapy  Plan:  -Continue current antidiabetic therapy -Have increased diltiazem and Imdur doses to allow weaning off Cleviprex -Ensure adequate pain control - Trial of inpatient CPAP as severe sleep apnea on sleep study from 2019 without visible weight loss since then.  Best practice (evaluated daily)  Diet: as tolerated  Pain/Anxiety/Delirium protocol (if indicated): per primary  VAP protocol (if indicated): n/a DVT prophylaxis: SCD's, ambulatory  GI prophylaxis: PPI Glucose control: SSI, lantus + oral agents  Mobility: as tolerated  Disposition: per NSGY   Goals of Care:  Last date of multidisciplinary goals of care discussion:n/a Family and staff present:  Summary of discussion:  Follow up goals of care discussion due:  Code Status: Full Code   Labs   CBC: Recent Labs  Lab 09/15/20 0917 09/17/20 1509 09/17/20 1513  WBC 6.6 9.8  --   HGB 15.6 16.1 17.0  HCT 49.5 51.0 50.0  MCV 86.5 85.6  --   PLT 134* 121*  --     Basic Metabolic Panel: Recent Labs  Lab 09/15/20 0917 09/17/20 1509 09/17/20 1513  NA 137 142 144  K 3.5 3.5 3.5  CL 101 105  --   CO2 26 27  --   GLUCOSE 146* 93  --   BUN 13 16  --   CREATININE 1.20 1.38*  --   CALCIUM 9.4 9.2  --    GFR: Estimated Creatinine Clearance: 65.7 mL/min (A) (by C-G formula based on SCr of 1.38 mg/dL (H)). Recent Labs  Lab 09/15/20 0917 09/17/20 1509  WBC 6.6 9.8    Liver Function Tests: No results for input(s): AST, ALT, ALKPHOS, BILITOT, PROT, ALBUMIN in the last 168 hours. No results for input(s): LIPASE, AMYLASE in the last 168 hours. No results for input(s): AMMONIA in the last 168 hours.  ABG    Component Value Date/Time   PHART 7.368 09/17/2020 1513   PCO2ART 49.2 (H) 09/17/2020 1513   PO2ART 69 (L) 09/17/2020  1513   HCO3 28.3 (H) 09/17/2020 1513   TCO2 30 09/17/2020 1513   O2SAT 93.0 09/17/2020 1513     Coagulation Profile: No results for input(s): INR, PROTIME in the last 168 hours.  Cardiac Enzymes: No results for input(s): CKTOTAL, CKMB, CKMBINDEX, TROPONINI in the last 168 hours.  HbA1C: Hgb A1c MFr Bld  Date/Time Value Ref Range Status  09/15/2020 09:16 AM 7.7 (H) 4.8 - 5.6 % Final    Comment:    (NOTE)         Prediabetes: 5.7 - 6.4         Diabetes: >6.4         Glycemic control for adults with diabetes: <7.0   05/10/2019 04:46 AM 8.6 (H) 4.8 - 5.6 % Final    Comment:    (NOTE) Pre diabetes:          5.7%-6.4% Diabetes:              >6.4% Glycemic  control for   <7.0% adults with diabetes     CBG: Recent Labs  Lab 09/18/20 1933 09/18/20 2319 09/19/20 0402 09/19/20 0750 09/19/20 1134  GLUCAP 251* 185* Wilson, MD [facility] ICU Physician [place]  Pager: [phone] Or Epic Secure Chat From 7A-7P if no response, please call [phone] After hours, please call ELink [phone] 09/19/2020, 1:15 PM

## 2020-09-19 NOTE — Progress Notes (Signed)
Subjective: The patient is alert and pleasant.  He wants to go home.  Objective: Vital signs in last 24 hours: Temp:  [98.2 F (36.8 C)-98.7 F (37.1 C)] 98.3 F (36.8 C) (02/19 0000) Pulse Rate:  [64-91] 73 (02/19 0900) Resp:  [11-32] 18 (02/19 0900) BP: (124-211)/(59-113) 161/74 (02/19 0900) SpO2:  [92 %-98 %] 93 % (02/19 0900) Estimated body mass index is 45.52 kg/m as calculated from the following:   Height as of this encounter: 5\' 6"  (1.676 m).   Weight as of this encounter: 127.9 kg.   Intake/Output from previous day: 02/18 0701 - 02/19 0700 In: 381.3 [I.V.:281.2; IV Piggyback:100.1] Out: 3150 [Urine:3150] Intake/Output this shift: Total I/O In: 16 [I.V.:16] Out: 200 [Urine:200]  Physical exam the patient is alert and pleasant.  His dressing is clean and dry.  There is no hematoma or shift.  He is moving all 4 extremities well.  He is on Mirapex.  Lab Results: Recent Labs    09/17/20 1509 09/17/20 1513  WBC 9.8  --   HGB 16.1 17.0  HCT 51.0 50.0  PLT 121*  --    BMET Recent Labs    09/17/20 1509 09/17/20 1513  NA 142 144  K 3.5 3.5  CL 105  --   CO2 27  --   GLUCOSE 93  --   BUN 16  --   CREATININE 1.38*  --   CALCIUM 9.2  --     Studies/Results: DG Cervical Spine 2-3 Views  Result Date: 09/17/2020 CLINICAL DATA:  Surgery, elective. Additional history provided: Anterior cervical decompression/discectomy fusion cervical 4-5, cervical 5-6, cervical 6-7. Provided fluoroscopy time 56 seconds (36.93 mGy). EXAM: CERVICAL SPINE - 2-3 VIEW; DG C-ARM 1-60 MIN COMPARISON:  MRI of the cervical spine 09/18/2019. FINDINGS: PA and lateral view intraoperative fluoroscopic images of the cervical spine are submitted, 3 images total. The images demonstrate sequela of interval C4-C7 ACDF. A ventral plate and screws are present at the C4-C6 levels. A separate ventral plate and screws are present at the C6-C7 levels. Interbody devices are now present at C4-C5, C5-C6 and  C6-C7. Curvilinear hyperdensity projects anterolateral to the C4-C6 levels, likely reflecting a surgical sponge or packing material. Partially visualized ET tube. IMPRESSION: 3 intraoperative fluoroscopic images of the cervical spine from interval C4-C7 ACDF, as described. Surgical sponge or packing material anterolateral to the C4-C6 levels. Correlate with the operative history. Electronically Signed   By: Kellie Simmering DO   On: 09/17/2020 13:46   DG C-Arm 1-60 Min  Result Date: 09/17/2020 CLINICAL DATA:  Surgery, elective. Additional history provided: Anterior cervical decompression/discectomy fusion cervical 4-5, cervical 5-6, cervical 6-7. Provided fluoroscopy time 56 seconds (36.93 mGy). EXAM: CERVICAL SPINE - 2-3 VIEW; DG C-ARM 1-60 MIN COMPARISON:  MRI of the cervical spine 09/18/2019. FINDINGS: PA and lateral view intraoperative fluoroscopic images of the cervical spine are submitted, 3 images total. The images demonstrate sequela of interval C4-C7 ACDF. A ventral plate and screws are present at the C4-C6 levels. A separate ventral plate and screws are present at the C6-C7 levels. Interbody devices are now present at C4-C5, C5-C6 and C6-C7. Curvilinear hyperdensity projects anterolateral to the C4-C6 levels, likely reflecting a surgical sponge or packing material. Partially visualized ET tube. IMPRESSION: 3 intraoperative fluoroscopic images of the cervical spine from interval C4-C7 ACDF, as described. Surgical sponge or packing material anterolateral to the C4-C6 levels. Correlate with the operative history. Electronically Signed   By: Kellie Simmering DO  On: 09/17/2020 13:46    Assessment/Plan: Postop day #2: The patient is doing well neurologically.  His blood pressure continues to be significantly elevated despite IV antihypertensive.  We will attempt to wean his IV medications today and possibly home tomorrow.  LOS: 2 days     Vincent Ramos 09/19/2020, 9:24 AM

## 2020-09-19 NOTE — Progress Notes (Signed)
Called report to [place] on 2H.  SWOT nurse transferred patient to new room with all belongings.

## 2020-09-20 LAB — GLUCOSE, CAPILLARY
Glucose-Capillary: 89 mg/dL (ref 70–99)
Glucose-Capillary: 54 mg/dL — ABNORMAL LOW (ref 70–99)
Glucose-Capillary: 77 mg/dL (ref 70–99)
Glucose-Capillary: 151 mg/dL — ABNORMAL HIGH (ref 70–99)
Glucose-Capillary: 102 mg/dL — ABNORMAL HIGH (ref 70–99)

## 2020-09-20 NOTE — Discharge Summary (Addendum)
Physician Discharge Summary     Providing Compassionate, Quality Care - Together   Patient ID: Vincent Ramos MRN: [id number] DOB/AGE: 10-11-1950 70 y.o.  Admit date: 09/17/2020 Discharge date: 09/20/2020  Admission Diagnoses: Active Problems:   Stenosis of cervical spine with myelopathy (HCC)   Cervical stenosis of spine   Discharge Diagnoses:  Active Problems:   Stenosis of cervical spine with myelopathy (HCC)   Cervical stenosis of spine   Discharged Condition: good  Hospital Course: Patient was admitted to 4N23 following C4-5, C5-6, and C6-7 ACDF by Dr. Marcello Moores on  09/17/2020. His postoperative course was complicated by hyperglycemia and hypertension, requiring antihypertensive IV infusion. His oral antihypertensive medications were adjusted and the patient is no longer requiring IV medication to control his blood pressure. His blood glucose level is within range.The patient is tolerating food well. He is having no issues with swallowing or complaints of shortness of breath.  His pain is well-controlled with oral pain medication.  His incision is covered with a dressing, which is clean, dry, and intact.  He is ready for discharge home.    Consults: pulmonary/intensive care  Significant Diagnostic Studies: radiology: DG Cervical Spine 2-3 Views  Result Date: 09/17/2020 CLINICAL DATA:  Surgery, elective. Additional history provided: Anterior cervical decompression/discectomy fusion cervical 4-5, cervical 5-6, cervical 6-7. Provided fluoroscopy time 56 seconds (36.93 mGy). EXAM: CERVICAL SPINE - 2-3 VIEW; DG C-ARM 1-60 MIN COMPARISON:  MRI of the cervical spine 09/18/2019. FINDINGS: PA and lateral view intraoperative fluoroscopic images of the cervical spine are submitted, 3 images total. The images demonstrate sequela of interval C4-C7 ACDF. A ventral plate and screws are present at the C4-C6 levels. A separate ventral plate and screws are present at the C6-C7 levels. Interbody  devices are now present at C4-C5, C5-C6 and C6-C7. Curvilinear hyperdensity projects anterolateral to the C4-C6 levels, likely reflecting a surgical sponge or packing material. Partially visualized ET tube. IMPRESSION: 3 intraoperative fluoroscopic images of the cervical spine from interval C4-C7 ACDF, as described. Surgical sponge or packing material anterolateral to the C4-C6 levels. Correlate with the operative history. Electronically Signed   By: Kellie Simmering DO   On: 09/17/2020 13:46   DG C-Arm 1-60 Min  Result Date: 09/17/2020 CLINICAL DATA:  Surgery, elective. Additional history provided: Anterior cervical decompression/discectomy fusion cervical 4-5, cervical 5-6, cervical 6-7. Provided fluoroscopy time 56 seconds (36.93 mGy). EXAM: CERVICAL SPINE - 2-3 VIEW; DG C-ARM 1-60 MIN COMPARISON:  MRI of the cervical spine 09/18/2019. FINDINGS: PA and lateral view intraoperative fluoroscopic images of the cervical spine are submitted, 3 images total. The images demonstrate sequela of interval C4-C7 ACDF. A ventral plate and screws are present at the C4-C6 levels. A separate ventral plate and screws are present at the C6-C7 levels. Interbody devices are now present at C4-C5, C5-C6 and C6-C7. Curvilinear hyperdensity projects anterolateral to the C4-C6 levels, likely reflecting a surgical sponge or packing material. Partially visualized ET tube. IMPRESSION: 3 intraoperative fluoroscopic images of the cervical spine from interval C4-C7 ACDF, as described. Surgical sponge or packing material anterolateral to the C4-C6 levels. Correlate with the operative history. Electronically Signed   By: Kellie Simmering DO   On: 09/17/2020 13:46     Treatments: surgery: 1. Arthrodesis C4-5, anterior interbody technique, including Discectomy for decompression of spinal cord and exiting nerve roots with foraminotomies  2. Arthrodesis, additional level C5-6 anterior interbody technique, including Discectomy for decompression of  spinal cord and exiting nerve roots with foraminotomies  3. 2.  Arthrodesis, additional level C6-7  anterior interbody technique, including Discectomy for decompression of spinal cord and exiting nerve roots with foraminotomies  4. Placement of intervertebral biomechanical device C4-5 5. Placement of intervertebral biomechanical device C5-6 6. Placement of intervertebral biomechanical device C6-7 7. Placement of anterior instrumentation consisting of interbody plate and screws Z0-2-5, and C6-7 8. Use of morselized bone allograft  9. Use of intraoperative microscope  Discharge Exam: Blood pressure (!) 142/77, pulse 65, temperature 98.9 F (37.2 C), resp. rate (!) 22, height 5\' 6"  (1.676 m), weight 127.9 kg, SpO2 90 %.   Per Dr. Adline Mango assessment this morning: Physical exam the patient is alert and pleasant. He is moving all 4 extremities. His wound is healing well.  Disposition: Discharge disposition: 01-Home or Self Care       Discharge Instructions    Ambulatory referral to Physical Therapy   Complete by: As directed    Incentive spirometry RT   Complete by: As directed    Polysomnography 4 or more parameters   Complete by: Oct 04, 2020    Where should this test be performed: [facility]     Allergies as of 09/20/2020      Reactions   Amlodipine    Felt "jittery" constantly   Novolog [insulin Aspart] Hives, Itching, Rash   Victoza [liraglutide] Itching, Rash      Medication List    TAKE these medications   albuterol 108 (90 Base) MCG/ACT inhaler Commonly known as: VENTOLIN HFA Inhale 2 puffs into the lungs every 6 (six) hours as needed for wheezing or shortness of breath.   aspirin EC 81 MG tablet Take 1 tablet (81 mg total) by mouth daily with breakfast.   azelastine 0.05 % ophthalmic solution Commonly known as: OPTIVAR Place 1 drop into the left eye See admin instructions. Instill 1 drop into both eyes twice daily for 2 days the day of and the  day after monthly eye injections   azelastine 0.1 % nasal spray Commonly known as: ASTELIN Place 2 sprays into both nostrils 2 (two) times daily. Use in each nostril as directed What changed:   when to take this  reasons to take this   [facility] Fast Pain Relief 845-65 MG Pack Generic drug: Aspirin-Caffeine Take 1 packet by mouth daily as needed (pain).   diltiazem 120 MG 24 hr capsule Commonly known as: Cardizem CD Take 1 capsule (120 mg total) by mouth daily. What changed: Another medication with the same name was added. Make sure you understand how and when to take each.   diltiazem 360 MG 24 hr capsule Commonly known as: CARDIZEM CD Take 1 capsule (360 mg total) by mouth daily. What changed: You were already taking a medication with the same name, and this prescription was added. Make sure you understand how and when to take each.   docusate sodium 100 MG capsule Commonly known as: COLACE Take 1 capsule (100 mg total) by mouth 2 (two) times daily.   furosemide 40 MG tablet Commonly known as: LASIX Take 40 mg by mouth daily as needed for edema.   gabapentin 300 MG capsule Commonly known as: NEURONTIN Take 100 mg by mouth 3 (three) times daily.   glipiZIDE 10 MG tablet Commonly known as: GLUCOTROL Take 10 mg by mouth 2 (two) times daily.   hydrALAZINE 100 MG tablet Commonly known as: APRESOLINE Take 100 mg by mouth 3 (three) times daily.   isosorbide mononitrate 30 MG 24 hr tablet Commonly known  as: IMDUR Take 1 tablet (30 mg total) by mouth daily.   multivitamin with minerals Tabs tablet Take 1 tablet by mouth daily.   Nebivolol HCl 20 MG Tabs Take 2 tablets (40 mg total) by mouth daily. What changed:   how much to take  when to take this   olmesartan-hydrochlorothiazide 40-25 MG tablet Commonly known as: BENICAR HCT Take 1 tablet by mouth daily.   oxyCODONE-acetaminophen 5-325 MG tablet Commonly known as: PERCOCET/ROXICET Take 1-2 tablets by mouth every  6 (six) hours as needed for moderate pain or severe pain.   polyethylene glycol 17 g packet Commonly known as: MIRALAX / GLYCOLAX Take 17 g by mouth daily. What changed:   when to take this  reasons to take this   rosuvastatin 40 MG tablet Commonly known as: CRESTOR Take 40 mg by mouth daily.   sitaGLIPtin 100 MG tablet Commonly known as: JANUVIA Take 100 mg by mouth daily.   spironolactone 25 MG tablet Commonly known as: ALDACTONE Take 0.5 tablets (12.5 mg total) by mouth daily.   tamsulosin 0.4 MG Caps capsule Commonly known as: FLOMAX Take 0.4 mg by mouth daily.   Trelegy Ellipta 100-62.5-25 MCG/INH Aepb Generic drug: Fluticasone-Umeclidin-Vilant Inhale 1 puff into the lungs daily.   Tyler Aas FlexTouch 100 UNIT/ML FlexTouch Pen Generic drug: insulin degludec Inject 80 Units into the skin daily.   Trulicity 1.5 VQ/2.5ZD Sopn Generic drug: Dulaglutide Inject 1.5 mg into the skin every Sunday.            Durable Medical Equipment  (From admission, onward)         Start     Ordered   09/19/20 1314  For home use only DME continuous positive airway pressure (CPAP)  Once       Question Answer Comment  Length of Need Lifetime   Patient has OSA or probable OSA Yes   Is the patient currently using CPAP in the home No   If no (to question two) date of sleep study 08/27/2017   Date of face to face encounter 02/25/2020   Settings 11-15   Signs and symptoms of probable OSA  (select all that apply) Snoring   CPAP supplies needed Mask, headgear, cushions, filters, heated tubing and water chamber      02 /19/22 1314          Follow-up Information    Vallarie Mare, MD Follow up in 2 week(s).   Specialty: Neurosurgery Contact information: [address] [address] [phone]        [facility] Follow up.   Specialty: Rehabilitation Why: Outpatient physical and occupational therapy; rehab center  will call you for an appointment.  Contact information: [address] [id number] [place] [phone]              Signed: Viona Gilmore, DNP, AGNP-C Nurse Practitioner  [facility] [place] [address], [place], [place] [address] P: [phone]    F: [phone]  09/20/2020, 1:29 PM

## 2020-09-20 NOTE — Progress Notes (Signed)
Inpatient Diabetes Program Recommendations  AACE/ADA: New Consensus Statement on Inpatient Glycemic Control (2015)  Target Ranges:  Prepandial:   less than 140 mg/dL      Peak postprandial:   less than 180 mg/dL (1-2 hours)      Critically ill patients:  140 - 180 mg/dL   Lab Results  Component Value Date   GLUCAP 151 (H) 09/20/2020   HGBA1C 7.7 (H) 09/15/2020    Review of Glycemic Control Results for Vincent Ramos, Vincent Ramos (MRN [id number]) as of 09/20/2020 08:52  Ref. Range 09/19/2020 20:12 09/20/2020 01:18 09/20/2020 04:56 09/20/2020 05:17 09/20/2020 07:45  Glucose-Capillary Latest Ref Range: 70 - 99 mg/dL 169 (H) 89 54 (L) 77 151 (H)   Current orders for Inpatient glycemic control: Lantus 80 units QHS, Novolog 0-20 units Q4H, Glucotrol 10 mg BID & Tradjenta 5 mg daily  Inpatient Diabetes Program Recommendations:    Patient is consuming a carb modified diet.  Please consider,  Novolog 0-20 units TID and 0-5 units QHS to avoid insulin stacking.  Will continue to follow while inpatient.  Thank you, Reche Dixon, RN, BSN Diabetes Coordinator Inpatient Diabetes Program [phone] (team pager from 8a-5p)

## 2020-09-20 NOTE — Care Management (Addendum)
Per handoff from [place] CM, patient's most recent sleep study was in 2019 and he would need another outpatient sleep study, set up through PCP, to qualify for CPAP.  ABG requested yesterday to assess if patient would qualify for BiPAP/ NIV. If patient does qualify, authorization through insurance would start on Monday per [place] w/ [employer].   Addendum- Spoke w Dr. Lynetta Mare, CPAP after DC will be pursued.

## 2020-09-20 NOTE — Progress Notes (Signed)
Occupational Therapy Treatment and Discharge Patient Details Name: Vincent Ramos MRN: [id number] DOB: October 21, 1950 Today's Date: 09/20/2020    History of present illness The pt is a 70 yo male presenting s/p C4-5, C5-6, and C6-7 ACDFs on 2/17 due to progressive issues with BUE numbness and weakness as well as some RLE issues with walking. PMH includes: DM II, HTN, COPD, CHF, and obesity.   OT comments  This 70 yo male admitted and underwent above is doing well today. Both UEs are working so much better than prior to surgery and pt is so happy about this. He no longer needs OPOT for this. He has his wife at home to A him prn and AE for LBD as well if he needs it. No further OT needs, we will sign off.  Follow Up Recommendations  No OT follow up    Equipment Recommendations  None recommended by OT       Precautions / Restrictions Precautions Precautions: Cervical Required Braces or Orthoses: Cervical Brace Cervical Brace: Hard collar;At all times Restrictions Weight Bearing Restrictions: No       Mobility Bed Mobility               General bed mobility comments: pt OOB in recliner at start and end of session  Transfers Overall transfer level: Needs assistance Equipment used: None Transfers: Sit to/from Stand Sit to Stand: Supervision         General transfer comment: ambulation in room S without AD    Balance Overall balance assessment: Mild deficits observed, not formally tested                                         ADL either performed or assessed with clinical judgement   ADL Overall ADL's : Needs assistance/impaired     Grooming: Set up;Supervision/safety;Standing;Wash/dry face;Oral care                   Toilet Transfer: Supervision/safety;Ambulation Toilet Transfer Details (indicate cue type and reason): Simulated recliner>to sink in room to groom>return to recliner                 Vision Baseline Vision/History:  Wears glasses Wears Glasses: Reading only            Cognition Arousal/Alertness: Awake/alert Behavior During Therapy: WFL for tasks assessed/performed Overall Cognitive Status: Within Functional Limits for tasks assessed                                          Exercises Other Exercises Other Exercises: Pt's RUE is "almost back to normal" per his report, just occasional pins and needles feelings; He has 4+/5 strength throughtout and good coordination. Reports he only has a little decreased sensation in left hand at 5th digit--again 4+/5 strength and coordination.           Pertinent Vitals/ Pain       Pain Assessment: No/denies pain         Frequency  Min 2X/week        Progress Toward Goals  OT Goals(current goals can now be found in the care plan section)  Progress towards OT goals:  (All education completed)  Acute Rehab OT Goals Patient Stated Goal: go home soon OT Goal Formulation: With patient  Time For Goal Achievement: 10/02/20 Potential to Achieve Goals: Good  Plan Discharge plan needs to be updated       AM-PAC OT "6 Clicks" Daily Activity     Outcome Measure   Help from another person eating meals?: None Help from another person taking care of personal grooming?: A Little Help from another person toileting, which includes using toliet, bedpan, or urinal?: A Little Help from another person bathing (including washing, rinsing, drying)?: A Little Help from another person to put on and taking off regular upper body clothing?: A Little Help from another person to put on and taking off regular lower body clothing?: A Little 6 Click Score: 19    End of Session    OT Visit Diagnosis: Muscle weakness (generalized) (M62.81)   Activity Tolerance Patient tolerated treatment well   Patient Left in chair;with call bell/phone within reach   Nurse Communication Mobility status        Time: 0148-4039 OT Time Calculation (min): 30  min  Charges: OT General Charges $OT Visit: 1 Visit OT Treatments $Self Care/Home Management : 23-37 mins  Golden Circle, OTR/L Acute [employer] Pager [phone] Office [phone]      Almon Register 09/20/2020, 10:41 AM

## 2020-09-20 NOTE — Progress Notes (Signed)
Subjective: The patient is alert and pleasant.  He complains his CPAP was uncomfortable.  Objective: Vital signs in last 24 hours: Temp:  [98 F (36.7 C)-98.5 F (36.9 C)] 98 F (36.7 C) (02/20 0700) Pulse Rate:  [50-107] 106 (02/20 0607) Resp:  [10-24] 17 (02/20 0607) BP: (114-167)/(56-92) 139/81 (02/20 0607) SpO2:  [88 %-100 %] 93 % (02/20 0918) Estimated body mass index is 45.52 kg/m as calculated from the following:   Height as of this encounter: 5\' 6"  (1.676 m).   Weight as of this encounter: 127.9 kg.   Intake/Output from previous day: 02/19 0701 - 02/20 0700 In: 545.4 [P.O.:480; I.V.:65.4] Out: 2200 [Urine:2200] Intake/Output this shift: No intake/output data recorded.  Physical exam the patient is alert and pleasant.  He is moving all 4 extremities.  His wound is healing well.  Lab Results: Recent Labs    09/17/20 1509 09/17/20 1513 09/19/20 1846  WBC 9.8  --   --   HGB 16.1 17.0 15.3  HCT 51.0 50.0 45.0  PLT 121*  --   --    BMET Recent Labs    09/17/20 1509 09/17/20 1513 09/19/20 1846  NA 142 144 135  K 3.5 3.5 3.6  CL 105  --   --   CO2 27  --   --   GLUCOSE 93  --   --   BUN 16  --   --   CREATININE 1.38*  --   --   CALCIUM 9.2  --   --     Studies/Results: No results found.  Assessment/Plan: Postop day #3: The patient is doing well neurologically.  Hypertension, sleep apnea, etc.: We are still in the process of weaning his IV antihypertensives.  If we can discontinue his IV medications and his blood pressure remains acceptable, i.e. less than 170, he can be discharged home and follow-up with his primary doctor for chronic management of his hypertension.  I gave him his discharge instructions and answered all his questions.  LOS: 3 days     Ophelia Charter 09/20/2020, 9:27 AM

## 2020-09-21 NOTE — Anesthesia Postprocedure Evaluation (Signed)
Anesthesia Post Note  Patient: Vincent Ramos  Procedure(s) Performed: Anterior Cevrvical Decompresion / Discectomy Fusion Cervical four-five,Cervical five-six,Cervical six- seven (N/A Neck)     Patient location during evaluation: PACU Anesthesia Type: General Level of consciousness: awake and alert Pain management: pain level controlled Vital Signs Assessment: post-procedure vital signs reviewed and stable Respiratory status: spontaneous breathing, nonlabored ventilation, respiratory function stable and patient connected to nasal cannula oxygen Cardiovascular status: blood pressure returned to baseline and stable Postop Assessment: no apparent nausea or vomiting Anesthetic complications: no   No complications documented.  Last Vitals:  Vitals:   09/20/20 1330 09/20/20 1400  BP: 125/72 (!) 134/91  Pulse: 65 60  Resp: 16 12  Temp:    SpO2: (!) 89% 90%    Last Pain:  Vitals:   09/20/20 1300  TempSrc:   PainSc: [name]

## 2020-10-07 ENCOUNTER — Other Ambulatory Visit: Payer: Self-pay

## 2020-10-07 ENCOUNTER — Encounter (INDEPENDENT_AMBULATORY_CARE_PROVIDER_SITE_OTHER): Payer: Medicare Other | Admitting: Ophthalmology

## 2020-10-07 DIAGNOSIS — E113312 Type 2 diabetes mellitus with moderate nonproliferative diabetic retinopathy with macular edema, left eye: Secondary | ICD-10-CM

## 2020-10-07 DIAGNOSIS — H35033 Hypertensive retinopathy, bilateral: Secondary | ICD-10-CM | POA: Diagnosis not present

## 2020-10-07 DIAGNOSIS — I10 Essential (primary) hypertension: Secondary | ICD-10-CM | POA: Diagnosis not present

## 2020-10-07 DIAGNOSIS — H33302 Unspecified retinal break, left eye: Secondary | ICD-10-CM

## 2020-10-07 DIAGNOSIS — H43813 Vitreous degeneration, bilateral: Secondary | ICD-10-CM

## 2020-10-07 DIAGNOSIS — E113391 Type 2 diabetes mellitus with moderate nonproliferative diabetic retinopathy without macular edema, right eye: Secondary | ICD-10-CM

## 2020-10-16 ENCOUNTER — Encounter (HOSPITAL_COMMUNITY): Payer: Self-pay | Admitting: Neurosurgery

## 2020-11-05 ENCOUNTER — Other Ambulatory Visit: Payer: Self-pay

## 2020-11-05 ENCOUNTER — Encounter (INDEPENDENT_AMBULATORY_CARE_PROVIDER_SITE_OTHER): Payer: Medicare Other | Admitting: Ophthalmology

## 2020-11-05 DIAGNOSIS — I10 Essential (primary) hypertension: Secondary | ICD-10-CM

## 2020-11-05 DIAGNOSIS — E113312 Type 2 diabetes mellitus with moderate nonproliferative diabetic retinopathy with macular edema, left eye: Secondary | ICD-10-CM

## 2020-11-05 DIAGNOSIS — H43813 Vitreous degeneration, bilateral: Secondary | ICD-10-CM

## 2020-11-05 DIAGNOSIS — H35033 Hypertensive retinopathy, bilateral: Secondary | ICD-10-CM | POA: Diagnosis not present

## 2020-11-05 DIAGNOSIS — H33301 Unspecified retinal break, right eye: Secondary | ICD-10-CM

## 2020-11-05 DIAGNOSIS — H2513 Age-related nuclear cataract, bilateral: Secondary | ICD-10-CM

## 2020-11-05 DIAGNOSIS — E113391 Type 2 diabetes mellitus with moderate nonproliferative diabetic retinopathy without macular edema, right eye: Secondary | ICD-10-CM

## 2020-11-11 ENCOUNTER — Ambulatory Visit (HOSPITAL_COMMUNITY): Payer: Medicare Other | Attending: Neurosurgery

## 2020-11-11 ENCOUNTER — Other Ambulatory Visit: Payer: Self-pay

## 2020-11-11 ENCOUNTER — Encounter (HOSPITAL_COMMUNITY): Payer: Self-pay

## 2020-11-11 DIAGNOSIS — M25611 Stiffness of right shoulder, not elsewhere classified: Secondary | ICD-10-CM | POA: Diagnosis present

## 2020-11-11 DIAGNOSIS — R29898 Other symptoms and signs involving the musculoskeletal system: Secondary | ICD-10-CM

## 2020-11-11 DIAGNOSIS — M25511 Pain in right shoulder: Secondary | ICD-10-CM | POA: Diagnosis present

## 2020-11-11 NOTE — Patient Instructions (Signed)
Theraputty Home Exercise Program  Complete 1-2 times a day.  putty squeeze  Pt. should squeeze putty in hand trying to keep it round by rotating putty after each squeeze. push fingers through putty to palm each time. Complete for ___3-5___ minutes.   PUTTY KEY GRIP  Hold the putty at the top of your hand. Squeeze the putty between your thumb and the side of your 2nd finger as shown. Complete for ___3-5_____ minutes.    PUTTY 3 JAW CHUCK  Roll up some putty into a ball then flatten it. Then, firmly squeeze it with your first 3 fingers as shown. Complete for __3-5____ minutes.           

## 2020-11-12 NOTE — Therapy (Signed)
[place], [place], [address] Phone: [phone]   Fax:  [phone]  Occupational Therapy Evaluation  Patient Details  Name: Vincent Ramos MRN: [id number] Date of Birth: [birth year] Referring Provider (OT): Duffy Rhody, MD   Encounter Date: 11/11/2020   OT End of Session - 11/12/20 1257    Visit Number 1    Number of Visits 4    Date for OT Re-Evaluation 12/09/20    Authorization Type [employer] Medicare    Authorization Time Period No visit limit. No authorization, Copay $30    OT Start Time 1515    OT Stop Time 1604    OT Time Calculation (min) 49 min    Activity Tolerance Patient tolerated treatment well    Behavior During Therapy WFL for tasks assessed/performed           Past Medical History:  Diagnosis Date  . Abnormal nuclear cardiac imaging test 12/27/2017  . Acute on chronic diastolic CHF (congestive heart failure) ([place]) 09/17/2017  . Acute respiratory failure with hypoxia ([facility]) 06/01/2017  . Arthritis   . Asthma   . CAD (coronary artery disease)    a. 11/2017: cath showing widely patent coronary arteries with normal left main, 30% mid LAD, luminal irregularities in the proximal and mid circumflex, and 30 to 40% stenosis in the mid RCA.  Mar[facility] Kitchen CAP (community acquired pneumonia) 07/31/2014  . Community acquired pneumonia 07/31/2014  . Constipation 02/06/2018  . COPD exacerbation ([place]) 09/17/2017  . Dysphagia   . Elevated troponin 09/17/2017  . Enlarged prostate   . Grade II diastolic dysfunction   . Hypertension   . Lumbar stenosis 10/07/2015  . Obesity 07/31/2014  . PNA (pneumonia) 05/30/2017  . Pneumonia   . Pneumonia   . Polyp of rectum   . Respiratory failure ([facility]) 09/18/2017  . SBO (small bowel obstruction) ([facility]) 05/08/2019  . Shortness of breath   . Sleep apnea    Sleep Study 08/21/17: Severe OSA  . Type 2 diabetes mellitus ([place])     Past Surgical History:  Procedure Laterality Date  .  ANTERIOR CERVICAL DECOMP/DISCECTOMY FUSION N/A 09/17/2020   Procedure: Anterior Cevrvical Decompresion / Discectomy Fusion Cervical four-five,Cervical five-six,Cervical six- seven;  Surgeon: Vallarie Mare, MD;  Location: [facility];  Service: Neurosurgery;  Laterality: N/A;  . CIRCUMCISION    . COLONOSCOPY WITH PROPOFOL N/A 08/08/2017   Procedure: COLONOSCOPY WITH PROPOFOL;  Surgeon: Danie Binder, MD;  [facility];  Service: Endoscopy;  Laterality: N/A;  1:00pm  . ESOPHAGEAL DILATION N/A 06/07/2017   Procedure: ESOPHAGEAL DILATION;  Surgeon: Daneil Dolin, MD;  [facility];  Service: Endoscopy;  Laterality: N/A;  . ESOPHAGOGASTRODUODENOSCOPY (EGD) WITH PROPOFOL N/A 06/07/2017   Procedure: ESOPHAGOGASTRODUODENOSCOPY (EGD) WITH PROPOFOL;  Surgeon: Daneil Dolin, MD;  [facility];  Service: Endoscopy;  Laterality: N/A;  . HYDROCELE EXCISION  10/14/2011   Procedure: HYDROCELECTOMY ADULT;  Surgeon: Malka So, MD;  Location[facility];  Service: Urology;  Laterality: Right;  . LACERATION REPAIR     left hand  . LUMBAR LAMINECTOMY/DECOMPRESSION MICRODISCECTOMY Right 10/07/2015   Procedure: Right Lumbar four-five ,lumbar five sacral-one Laminectomy;  Surgeon: Leeroy Cha, MD;  Location: [facility];  Service: Neurosurgery;  Laterality: Right;  . RIGHT/LEFT HEART CATH AND CORONARY ANGIOGRAPHY N/A 12/27/2017   Procedure: RIGHT/LEFT HEART CATH AND CORONARY ANGIOGRAPHY;  Surgeon: Belva Crome, MD;  Location: [facility];  Service: Cardiovascular;  Laterality: N/A;    There were no vitals filed for this visit.   Subjective Assessment - 11/11/20 1527    Subjective  S: I had surgery on my neck and now I'm getting sharp shooting pain down my arm occassionally.    Pertinent History Pt is a 70 y/o male S/P cervical decompression to address spinal stenosis on 09/17/20. Pt has now been experiencing decreased hand strength, decreased shoulder ROM, and pain. Dr.Thomas  has referred patient to occupational therapy for evaluation and treatment.    Patient Stated Goals To decrease pain and increase strength.    Currently in Pain? Yes    Pain Score 8     Pain Location Arm    Pain Orientation Right    Pain Descriptors / Indicators Shooting;Sharp;Stabbing;Tingling    Pain Type Surgical pain    Pain Radiating Towards shoulder to hand    Pain Onset More than a month ago    Pain Frequency Occasional    Aggravating Factors  happens for no reason. sometimes will have occure hand is hanging down by side.    Pain Relieving Factors BC Goody Powder    Effect of Pain on Daily Activities Can be severe.    Multiple Pain Sites No             [facility] OT Assessment - 11/11/20 1534      Assessment   Medical Diagnosis S/P spinal stenosis    Referring Provider (OT) Duffy Rhody, MD    Onset Date/Surgical Date 09/17/20    Hand Dominance Right    Next MD Visit next month May    Prior Therapy Not for this referral.      Precautions   Precautions None      Restrictions   Weight Bearing Restrictions No      Balance Screen   Has the patient fallen in the past 6 months No      Home  Environment   Family/patient expects to be discharged to: Private residence      Prior Function   Level of Independence Independent    Vocation On disability   retired     ADL   ADL comments Difficulty grasping and holding onto items in right hand, signing name      Mobility   Mobility Status Independent      Written Expression   Dominant Hand Right      Vision - History   Baseline Vision No visual deficits      Cognition   Overall Cognitive Status Within Functional Limits for tasks assessed      Observation/Other Assessments   Focus on Therapeutic Outcomes (FOTO)  N/A      Posture/Postural Control   Posture/Postural Control Postural limitations    Postural Limitations Rounded Shoulders;Forward head      Coordination   9 Hole Peg Test Right;Left    Right 9 Hole  Peg Test 23.1"    Left 9 Hole Peg Test 22.6"      ROM / Strength   AROM / PROM / Strength AROM;Strength      AROM   Overall AROM Comments Assessed seated IR/er adducted.    AROM Assessment Site Shoulder    Right/Left Shoulder Right    Right Shoulder Flexion 135 Degrees    Right Shoulder ABduction 155 Degrees    Right Shoulder Internal Rotation 70 Degrees    Right Shoulder External Rotation 70 Degrees      Strength   Overall  Strength Comments Assessed seated. IR/er adducted    Strength Assessment Site Shoulder;Hand    Right/Left Shoulder Right    Right Shoulder Flexion 5/5    Right Shoulder ABduction 5/5    Right Shoulder Internal Rotation 5/5    Right Shoulder External Rotation 5/5    Right/Left hand Right;Left    Right Hand Grip (lbs) 55    Right Hand Lateral Pinch 10 lbs    Right Hand 3 Point Pinch 12 lbs    Left Hand Grip (lbs) 105    Left Hand Lateral Pinch 27 lbs    Left Hand 3 Point Pinch 19 lbs                           OT Education - 11/12/20 1255    Education Details red putty - hand strengthening    Person(s) Educated Patient    Methods Explanation;Demonstration;Handout    Comprehension Verbalized understanding;Returned demonstration            OT Short Term Goals - 11/12/20 1305      OT SHORT TERM GOAL #1   Title Patient will be educated and independent with HEP in order to increase functional use of his right UE and return to using it as his dominant extremity for all desired tasks.    Time 4    Period Weeks    Status New    Target Date 12/09/20      OT SHORT TERM GOAL #2   Title Patient will increase his right shoulder A/ROM to WNL in order to complete high level reaching tasks with less difficulty.    Time 4    Period Weeks    Status New      OT SHORT TERM GOAL #3   Title Patient will report a decrease in pain level in the RUE of approximately4/10 or less when completing daily tasks.    Time 4    Period Weeks    Status New       OT SHORT TERM GOAL #4   Title Patient will increase grip strength by 10# and pinch strength by 5# where needed in order to complete tasks requiring a sustained grip such as holding onto items without dropping and signing his name.    Time 4    Period Weeks    Status New                    Plan - 11/12/20 1259    Clinical Impression Statement A: Patient is a 70 y/o male S/P spinal stenosis with cervical decompression performed and is now experiencing occassional shooting pain from his neck down to his hand, decreased shoulder ROM, and decreased hand strength resulting in difficulty completing daily tasks using his right hand as his dominant extremity.    OT Occupational Profile and History Problem Focused Assessment - Including review of records relating to presenting problem    Occupational performance deficits (Please refer to evaluation for details): ADL's;IADL's    Body Structure / Function / Physical Skills Pain;ROM;Strength;Edema;ADL    Rehab Potential Excellent    Clinical Decision Making Limited treatment options, no task modification necessary    Comorbidities Affecting Occupational Performance: May have comorbidities impacting occupational performance    Modification or Assistance to Complete Evaluation  No modification of tasks or assist necessary to complete eval    OT Frequency 1x / week    OT Duration 4 weeks  OT Treatment/Interventions Self-care/ADL training;Ultrasound;Patient/family education;DME and/or AE instruction;Passive range of motion;Cryotherapy;Electrical Stimulation;Moist Heat;Neuromuscular education;Therapeutic activities;Manual Therapy;Therapeutic exercise    Plan P: Patient will benefit from skilled OT services to increase functional performance and increase ability to return to using his RUE as his dominant extremity with less difficulty. Treatment plan: myofascial release to right upper arm and cervical region, passive ROM shoulder, A/ROM, general  shoulder strengthening, hand strength.    OT Home Exercise Plan eval: red putty    Consulted and Agree with Plan of Care Patient           Patient will benefit from skilled therapeutic intervention in order to improve the following deficits and impairments:   Body Structure / Function / Physical Skills: Pain,ROM,Strength,Edema,ADL       Visit Diagnosis: Other symptoms and signs involving the musculoskeletal system - Plan: Ot plan of care cert/re-cert  Stiffness of right shoulder, not elsewhere classified - Plan: Ot plan of care cert/re-cert  Acute pain of right shoulder - Plan: Ot plan of care cert/re-cert    Problem List Patient Active Problem List   Diagnosis Date Noted  . Stenosis of cervical spine with myelopathy ([facility]) 09/17/2020  . Cervical stenosis of spine 09/17/2020  . SBO (small bowel obstruction) ([place]) 05/08/2019  . Constipation 02/06/2018  . Abnormal nuclear cardiac imaging test 12/27/2017  . Respiratory failure ([facility]) 09/18/2017  . COPD exacerbation ([facility]) 09/17/2017  . Elevated troponin 09/17/2017  . Grade II diastolic dysfunction 97/35/3299  . Acute on chronic diastolic CHF (congestive heart failure) ([facility]) 09/17/2017  . Polyp of rectum   . Encounter for screening colonoscopy 07/09/2017  . Aspiration pneumonia ([place]) 06/06/2017  . Dysphagia   . Acute respiratory failure with hypoxia ([facility]) 06/01/2017  . PNA (pneumonia) 05/30/2017  . Lumbar stenosis 10/07/2015  . Type 2 diabetes mellitus ([place]) 07/31/2014  . Hypertension 07/31/2014  . Obesity 07/31/2014  . Community acquired pneumonia 07/31/2014    Ailene Ravel, OTR/L,CBIS  [phone]  11/12/2020, 1:10 PM  [facility] [address] [place], [place], [address] Phone: [phone]   Fax:  [phone]  Name: Vincent Ramos MRN: [id number] Date of Birth: [birth year]

## 2020-12-01 ENCOUNTER — Ambulatory Visit (HOSPITAL_COMMUNITY): Payer: Medicare Other | Attending: Neurosurgery | Admitting: Occupational Therapy

## 2020-12-01 ENCOUNTER — Other Ambulatory Visit: Payer: Self-pay

## 2020-12-01 ENCOUNTER — Encounter (HOSPITAL_COMMUNITY): Payer: Self-pay | Admitting: Occupational Therapy

## 2020-12-01 DIAGNOSIS — R29898 Other symptoms and signs involving the musculoskeletal system: Secondary | ICD-10-CM | POA: Diagnosis not present

## 2020-12-01 DIAGNOSIS — M25611 Stiffness of right shoulder, not elsewhere classified: Secondary | ICD-10-CM | POA: Diagnosis present

## 2020-12-01 DIAGNOSIS — M25511 Pain in right shoulder: Secondary | ICD-10-CM | POA: Diagnosis present

## 2020-12-01 NOTE — Therapy (Signed)
Vincent Ramos, [place], [address] Phone: [phone]   Fax:  [phone]  Occupational Therapy Treatment  Patient Details  Name: Vincent Ramos MRN: [id number] Date of Birth: 1950-08-15 Referring Provider (OT): Duffy Rhody, MD   Encounter Date: 12/01/2020   OT End of Session - 12/01/20 0851    Visit Number 2    Number of Visits 4    Date for OT Re-Evaluation 12/09/20    Authorization Type UHC Medicare    Authorization Time Period No visit limit. No authorization, Copay $30    OT Start Time 0813    OT Stop Time 0855    OT Time Calculation (min) 42 min    Activity Tolerance Patient tolerated treatment well    Behavior During Therapy WFL for tasks assessed/performed           Past Medical History:  Diagnosis Date  . Abnormal nuclear cardiac imaging test 12/27/2017  . Acute on chronic diastolic CHF (congestive heart failure) ([place]) 09/17/2017  . Acute respiratory failure with hypoxia ([place]) 06/01/2017  . Arthritis   . Asthma   . CAD (coronary artery disease)    a. 11/2017: cath showing widely patent coronary arteries with normal left main, 30% mid LAD, luminal irregularities in the proximal and mid circumflex, and 30 to 40% stenosis in the mid RCA.  Marland Kitchen CAP (community acquired pneumonia) 07/31/2014  . Community acquired pneumonia 07/31/2014  . Constipation 02/06/2018  . COPD exacerbation ([place]) 09/17/2017  . Dysphagia   . Elevated troponin 09/17/2017  . Enlarged prostate   . Grade II diastolic dysfunction   . Hypertension   . Lumbar stenosis 10/07/2015  . Obesity 07/31/2014  . PNA (pneumonia) 05/30/2017  . Pneumonia   . Pneumonia   . Polyp of rectum   . Respiratory failure ([place]) 09/18/2017  . SBO (small bowel obstruction) ([place]) 05/08/2019  . Shortness of breath   . Sleep apnea    Sleep Study 08/21/17: Severe OSA  . Type 2 diabetes mellitus ([place])     Past Surgical History:  Procedure Laterality Date  .  ANTERIOR CERVICAL DECOMP/DISCECTOMY FUSION N/A 09/17/2020   Procedure: Anterior Cevrvical Decompresion / Discectomy Fusion Cervical four-five,Cervical five-six,Cervical six- seven;  Surgeon: Vallarie Mare, MD;  Location: [facility];  Service: Neurosurgery;  Laterality: N/A;  . CIRCUMCISION    . COLONOSCOPY WITH PROPOFOL N/A 08/08/2017   Procedure: COLONOSCOPY WITH PROPOFOL;  Surgeon: Danie Binder, MD;  [facility];  Service: Endoscopy;  Laterality: N/A;  1:00pm  . ESOPHAGEAL DILATION N/A 06/07/2017   Procedure: ESOPHAGEAL DILATION;  Surgeon: Daneil Dolin, MD;  [facility];  Service: Endoscopy;  Laterality: N/A;  . ESOPHAGOGASTRODUODENOSCOPY (EGD) WITH PROPOFOL N/A 06/07/2017   Procedure: ESOPHAGOGASTRODUODENOSCOPY (EGD) WITH PROPOFOL;  Surgeon: Daneil Dolin, MD;  [facility];  Service: Endoscopy;  Laterality: N/A;  . HYDROCELE EXCISION  10/14/2011   Procedure: HYDROCELECTOMY ADULT;  Surgeon: Malka So, MD;  [facility];  Service: Urology;  Laterality: Right;  . LACERATION REPAIR     left hand  . LUMBAR LAMINECTOMY/DECOMPRESSION MICRODISCECTOMY Right 10/07/2015   Procedure: Right Lumbar four-five ,lumbar five sacral-one Laminectomy;  Surgeon: Leeroy Cha, MD;  Location: [facility];  Service: Neurosurgery;  Laterality: Right;  . RIGHT/LEFT HEART CATH AND CORONARY ANGIOGRAPHY N/A 12/27/2017   Procedure: RIGHT/LEFT HEART CATH AND CORONARY ANGIOGRAPHY;  Surgeon: Belva Crome, MD;  Location: [facility];  Service: Cardiovascular;  Laterality: N/A;    There were no vitals filed for this visit.   Subjective Assessment - 12/01/20 0808    Subjective  S: It comes and goes with feeling numb.    Currently in Pain? No/denies              [facility] OT Assessment - 12/01/20 2979      Assessment   Medical Diagnosis S/P spinal stenosis      Precautions   Precautions None                    OT Treatments/Exercises (OP) - 12/01/20  0815      Exercises   Exercises Shoulder;Hand;Neck      Neck Exercises: Seated   X to V 10 reps    Shoulder Rolls Backwards;Forwards;5 reps    Shoulder Flexion 10 reps    Shoulder ABduction 10 reps      Shoulder Exercises: Supine   Protraction PROM;5 reps;AROM;10 reps    Horizontal ABduction PROM;5 reps;AROM;10 reps    External Rotation PROM;5 reps;AROM;10 reps    Internal Rotation PROM;5 reps;AROM;10 reps    Flexion PROM;5 reps;AROM;10 reps    ABduction PROM;5 reps;AROM;10 reps      Shoulder Exercises: Standing   Horizontal ABduction AROM;10 reps    Extension Theraband;10 reps    Theraband Level (Shoulder Extension) Level 2 (Red)    Row Theraband;10 reps    Theraband Level (Shoulder Row) Level 2 (Red)    Retraction Theraband;10 reps    Theraband Level (Shoulder Retraction) Level 2 (Red)      Shoulder Exercises: ROM/Strengthening   UBE (Upper Arm Bike) Level 1, 3' reverse, pace: 5.5    Prot/Ret//Elev/Dep 1'      Hand Exercises   Hand Gripper with Large Beads all beads gripper at 55#, vertical and horizontal    Hand Gripper with Medium Beads all beads gripper at 55#, horziontal    Sponges pt using green resistive clothespins and 3 point pinch to grasp and stack 4 towers of 5 sponges.      Manual Therapy   Manual Therapy Myofascial release    Manual therapy comments performed separately from all other inteventions    Myofascial Release myofascial release to right upper arm, trapezius, and cervical neck regions to decrease pain and fascial restrictions and increase joint ROM                    OT Short Term Goals - 12/01/20 0827      OT SHORT TERM GOAL #1   Title Patient will be educated and independent with HEP in order to increase functional use of his right UE and return to using it as his dominant extremity for all desired tasks.    Time 4    Period Weeks    Status On-going    Target Date 12/09/20      OT SHORT TERM GOAL #2   Title Patient will  increase his right shoulder A/ROM to WNL in order to complete high level reaching tasks with less difficulty.    Time 4    Period Weeks    Status On-going      OT SHORT TERM GOAL #3   Title Patient will report a decrease in pain level in the RUE of approximately4/10 or less when completing daily tasks.    Time 4    Period Weeks    Status On-going      OT SHORT TERM  GOAL #4   Title Patient will increase grip strength by 10# and pinch strength by 5# where needed in order to complete tasks requiring a sustained grip such as holding onto items without dropping and signing his name.    Time 4    Period Weeks    Status On-going                    Plan - 12/01/20 0827    Clinical Impression Statement A: Pt reports less shooting pains and feels like he is getting more strength in his hand. Initiated myofascial release and manual techniques, also initiating passive stretching and A/ROM in supine and standing. Pt completing x to v arms, prot/ret/elev/dep and UBE in reverse for scapular mobility and improved retraction. Scapular theraband completed as well. Pt working on grip strengthening tasks with hand gripper and pinch strengthening with green resistive clothespin. No pain reported during session, rest breaks provided as needed for fatigue. Verbal cuing for form and technique.    Body Structure / Function / Physical Skills Pain;ROM;Strength;Edema;ADL    Plan P: Reassessment. Continue with neck/shoulder A/ROM, grip strengthening. Update HEP for scapular theraband    OT Home Exercise Plan eval: red putty    Consulted and Agree with Plan of Care Patient           Patient will benefit from skilled therapeutic intervention in order to improve the following deficits and impairments:   Body Structure / Function / Physical Skills: Pain,ROM,Strength,Edema,ADL       Visit Diagnosis: Other symptoms and signs involving the musculoskeletal system  Stiffness of right shoulder, not  elsewhere classified  Acute pain of right shoulder    Problem List Patient Active Problem List   Diagnosis Date Noted  . Stenosis of cervical spine with myelopathy ([place]) 09/17/2020  . Cervical stenosis of spine 09/17/2020  . SBO (small bowel obstruction) ([place]) 05/08/2019  . Constipation 02/06/2018  . Abnormal nuclear cardiac imaging test 12/27/2017  . Respiratory failure ([place]) 09/18/2017  . COPD exacerbation ([place]) 09/17/2017  . Elevated troponin 09/17/2017  . Grade II diastolic dysfunction 41/32/4401  . Acute on chronic diastolic CHF (congestive heart failure) ([place]) 09/17/2017  . Polyp of rectum   . Encounter for screening colonoscopy 07/09/2017  . Aspiration pneumonia ([place]) 06/06/2017  . Dysphagia   . Acute respiratory failure with hypoxia ([place]) 06/01/2017  . PNA (pneumonia) 05/30/2017  . Lumbar stenosis 10/07/2015  . Type 2 diabetes mellitus ([place]) 07/31/2014  . Hypertension 07/31/2014  . Obesity 07/31/2014  . Community acquired pneumonia 07/31/2014   Guadelupe Sabin, OTR/L  [phone] 12/01/2020, 8:56 AM  [facility] [address] [place], [place], [address] Phone: [phone]   Fax:  [phone]  Name: Naksh Radi Offord MRN: [id number] Date of Birth: Apr 02, 1951

## 2020-12-07 ENCOUNTER — Encounter (INDEPENDENT_AMBULATORY_CARE_PROVIDER_SITE_OTHER): Payer: Medicare Other | Admitting: Ophthalmology

## 2020-12-07 ENCOUNTER — Other Ambulatory Visit: Payer: Self-pay

## 2020-12-07 DIAGNOSIS — H35033 Hypertensive retinopathy, bilateral: Secondary | ICD-10-CM

## 2020-12-07 DIAGNOSIS — E113312 Type 2 diabetes mellitus with moderate nonproliferative diabetic retinopathy with macular edema, left eye: Secondary | ICD-10-CM | POA: Diagnosis not present

## 2020-12-07 DIAGNOSIS — I10 Essential (primary) hypertension: Secondary | ICD-10-CM | POA: Diagnosis not present

## 2020-12-07 DIAGNOSIS — H33301 Unspecified retinal break, right eye: Secondary | ICD-10-CM

## 2020-12-07 DIAGNOSIS — E113391 Type 2 diabetes mellitus with moderate nonproliferative diabetic retinopathy without macular edema, right eye: Secondary | ICD-10-CM | POA: Diagnosis not present

## 2020-12-07 DIAGNOSIS — H43813 Vitreous degeneration, bilateral: Secondary | ICD-10-CM

## 2020-12-08 ENCOUNTER — Encounter (HOSPITAL_COMMUNITY): Payer: Self-pay | Admitting: Specialist

## 2020-12-08 ENCOUNTER — Ambulatory Visit (HOSPITAL_COMMUNITY): Payer: Medicare Other | Admitting: Specialist

## 2020-12-08 DIAGNOSIS — M25511 Pain in right shoulder: Secondary | ICD-10-CM

## 2020-12-08 DIAGNOSIS — R29898 Other symptoms and signs involving the musculoskeletal system: Secondary | ICD-10-CM

## 2020-12-08 DIAGNOSIS — M25611 Stiffness of right shoulder, not elsewhere classified: Secondary | ICD-10-CM

## 2020-12-08 NOTE — Therapy (Signed)
[facility] [place], [place], [address] Phone: [phone]   Fax:  [phone]  Occupational Therapy Treatment  Patient Details  Name: Vincent Ramos MRN: [id number] Date of Birth: Jan 29, 1951 Referring Provider (OT): Duffy Rhody, MD   Encounter Date: 12/08/2020   OT End of Session - 12/08/20 2035    Visit Number 3    Number of Visits 4    Date for OT Re-Evaluation 12/22/20    Authorization Type UHC Medicare    Authorization Time Period No visit limit. No authorization, Copay $30    OT Start Time 1435    OT Stop Time 1515    OT Time Calculation (min) 40 min    Activity Tolerance Patient tolerated treatment well    Behavior During Therapy WFL for tasks assessed/performed           Past Medical History:  Diagnosis Date  . Abnormal nuclear cardiac imaging test 12/27/2017  . Acute on chronic diastolic CHF (congestive heart failure) ([place]) 09/17/2017  . Acute respiratory failure with hypoxia ([place]) 06/01/2017  . Arthritis   . Asthma   . CAD (coronary artery disease)    a. 11/2017: cath showing widely patent coronary arteries with normal left main, 30% mid LAD, luminal irregularities in the proximal and mid circumflex, and 30 to 40% stenosis in the mid RCA.  Marland Kitchen CAP (community acquired pneumonia) 07/31/2014  . Community acquired pneumonia 07/31/2014  . Constipation 02/06/2018  . COPD exacerbation ([place]) 09/17/2017  . Dysphagia   . Elevated troponin 09/17/2017  . Enlarged prostate   . Grade II diastolic dysfunction   . Hypertension   . Lumbar stenosis 10/07/2015  . Obesity 07/31/2014  . PNA (pneumonia) 05/30/2017  . Pneumonia   . Pneumonia   . Polyp of rectum   . Respiratory failure ([place]) 09/18/2017  . SBO (small bowel obstruction) ([place]) 05/08/2019  . Shortness of breath   . Sleep apnea    Sleep Study 08/21/17: Severe OSA  . Type 2 diabetes mellitus ([place])     Past Surgical History:  Procedure Laterality Date  .  ANTERIOR CERVICAL DECOMP/DISCECTOMY FUSION N/A 09/17/2020   Procedure: Anterior Cevrvical Decompresion / Discectomy Fusion Cervical four-five,Cervical five-six,Cervical six- seven;  Surgeon: Vallarie Mare, MD;  Location: [facility];  Service: Neurosurgery;  Laterality: N/A;  . CIRCUMCISION    . COLONOSCOPY WITH PROPOFOL N/A 08/08/2017   Procedure: COLONOSCOPY WITH PROPOFOL;  Surgeon: Danie Binder, MD;  [facility];  Service: Endoscopy;  Laterality: N/A;  1:00pm  . ESOPHAGEAL DILATION N/A 06/07/2017   Procedure: ESOPHAGEAL DILATION;  Surgeon: Daneil Dolin, MD;  [facility];  Service: Endoscopy;  Laterality: N/A;  . ESOPHAGOGASTRODUODENOSCOPY (EGD) WITH PROPOFOL N/A 06/07/2017   Procedure: ESOPHAGOGASTRODUODENOSCOPY (EGD) WITH PROPOFOL;  Surgeon: Daneil Dolin, MD;  [facility];  Service: Endoscopy;  Laterality: N/A;  . HYDROCELE EXCISION  10/14/2011   Procedure: HYDROCELECTOMY ADULT;  Surgeon: Malka So, MD;  [facility];  Service: Urology;  Laterality: Right;  . LACERATION REPAIR     left hand  . LUMBAR LAMINECTOMY/DECOMPRESSION MICRODISCECTOMY Right 10/07/2015   Procedure: Right Lumbar four-five ,lumbar five sacral-one Laminectomy;  Surgeon: Leeroy Cha, MD;  Location: [facility];  Service: Neurosurgery;  Laterality: Right;  . RIGHT/LEFT HEART CATH AND CORONARY ANGIOGRAPHY N/A 12/27/2017   Procedure: RIGHT/LEFT HEART CATH AND CORONARY ANGIOGRAPHY;  Surgeon: Belva Crome, MD;  Location: [facility];  Service: Cardiovascular;  Laterality: N/A;    There were no vitals filed for this visit.   Subjective Assessment - 12/08/20 2034    Subjective  S:  it still gets shoots of pain and is numb.  I only want to do 4 visits and then be done.    Currently in Pain? No/denies              [facility] OT Assessment - 12/08/20 0001      Assessment   Medical Diagnosis S/P spinal stenosis    Referring Provider (OT) Duffy Rhody, MD       Precautions   Precautions None      Coordination   Right 9 Hole Peg Test 25.97"   23.1"     AROM   Right Shoulder Flexion 150 Degrees   135   Right Shoulder ABduction 160 Degrees   155   Right Shoulder Internal Rotation 70 Degrees   70   Right Shoulder External Rotation 80 Degrees   70     Strength   Right Shoulder Flexion 5/5    Right Shoulder ABduction 5/5    Right Shoulder Internal Rotation 5/5    Right Shoulder External Rotation 5/5    Right Hand Grip (lbs) 75   55   Right Hand Lateral Pinch 12 lbs   10   Right Hand 3 Point Pinch 10 lbs   12   Left Hand Grip (lbs) 112   105   Left Hand Lateral Pinch 26 lbs   27   Left Hand 3 Point Pinch 22 lbs   19                   OT Treatments/Exercises (OP) - 12/08/20 0001      Exercises   Exercises Shoulder;Hand;Theraputty      Shoulder Exercises: Standing   Extension Theraband;10 reps    Theraband Level (Shoulder Extension) Level 2 (Red)    Row Theraband;10 reps    Theraband Level (Shoulder Row) Level 2 (Red)    Retraction Theraband;10 reps    Theraband Level (Shoulder Retraction) Level 2 (Red)      Shoulder Exercises: ROM/Strengthening   UBE (Upper Arm Bike) Level 1 3' reverse at 4.0 speed      Hand Exercises   Other Hand Exercises using gripper set at 55# able to pick up all large beads and 1/2 of medium beads, then held horizontally to finish medium beads and small beads    Other Hand Exercises grooved pegboard, placed and able to remove 5 at a time translating from fingers to palm and holding while picking up another peg      Theraputty   Theraputty - Flatten pink standingn    Theraputty - Roll pink    Theraputty - Grip pink supinated and pronated grasp    Theraputty Hand- Locate Pegs located 10 beads and was then able to pick them up translate to palm and hold while picking up another and then translate one by one to place back on table.                    OT Short Term Goals - 12/08/20 2040       OT SHORT TERM GOAL #1   Title Patient will be educated and independent with HEP in order to increase functional use of his right UE and return to using it as his dominant extremity for all desired tasks.    Time 4  Period Weeks    Status On-going    Target Date 12/09/20      OT SHORT TERM GOAL #2   Title Patient will increase his right shoulder A/ROM to WNL in order to complete high level reaching tasks with less difficulty.    Time 4    Period Weeks    Status Achieved      OT SHORT TERM GOAL #3   Title Patient will report a decrease in pain level in the RUE of approximately4/10 or less when completing daily tasks.    Time 4    Period Weeks    Status On-going      OT SHORT TERM GOAL #4   Title Patient will increase grip strength by 10# and pinch strength by 5# where needed in order to complete tasks requiring a sustained grip such as holding onto items without dropping and signing his name.    Time 4    Period Weeks    Status On-going                    Plan - 12/08/20 2037    Clinical Impression Statement A:  Patient reports doing better and still feeling numbness and shooting pain in his right arm at times.  He has not been able to complete 4 visits as planned due to difficulty scheduling.  We will extend certification for an additional 2 weeks in order for patient to complete 4th visit of POC.  He has made improvements in his shoulder A/ROM, which is now [facility].  His arm and grip/pinch strength have improved, as has his fine motor coordination, although it does not equal his left arm and hand strength or coordination.  Patient will benefit from continued skilled OT intervention to improve his strength and coordination and update HEP before being dc at his request and transitioning to an HEP.    Occupational performance deficits (Please refer to evaluation for details): ADL's;IADL's    Body Structure / Function / Physical Skills Pain;ROM;Strength;Edema;ADL    OT Frequency  1x / week    OT Duration 2 weeks    OT Treatment/Interventions Self-care/ADL training;Ultrasound;Patient/family education;DME and/or AE instruction;Passive range of motion;Cryotherapy;Electrical Stimulation;Moist Heat;Neuromuscular education;Therapeutic activities;Manual Therapy;Therapeutic exercise    Plan P:  Update HEP and dc from skilled OT intervention per patient request after next (4th) visit.    Consulted and Agree with Plan of Care Patient           Patient will benefit from skilled therapeutic intervention in order to improve the following deficits and impairments:   Body Structure / Function / Physical Skills: Pain,ROM,Strength,Edema,ADL       Visit Diagnosis: Other symptoms and signs involving the musculoskeletal system  Stiffness of right shoulder, not elsewhere classified  Acute pain of right shoulder    Problem List Patient Active Problem List   Diagnosis Date Noted  . Stenosis of cervical spine with myelopathy ([place]) 09/17/2020  . Cervical stenosis of spine 09/17/2020  . SBO (small bowel obstruction) ([place]) 05/08/2019  . Constipation 02/06/2018  . Abnormal nuclear cardiac imaging test 12/27/2017  . Respiratory failure ([place]) 09/18/2017  . COPD exacerbation ([place]) 09/17/2017  . Elevated troponin 09/17/2017  . Grade II diastolic dysfunction 54/04/8118  . Acute on chronic diastolic CHF (congestive heart failure) ([place]) 09/17/2017  . Polyp of rectum   . Encounter for screening colonoscopy 07/09/2017  . Aspiration pneumonia ([place]) 06/06/2017  . Dysphagia   . Acute respiratory failure with hypoxia ([place]) 06/01/2017  .  PNA (pneumonia) 05/30/2017  . Lumbar stenosis 10/07/2015  . Type 2 diabetes mellitus ([place]) 07/31/2014  . Hypertension 07/31/2014  . Obesity 07/31/2014  . Community acquired pneumonia 07/31/2014    Vangie Bicker, [place], OTR/L [phone]  12/08/2020, 8:49 PM  [facility] [place], [place], [address] Phone: [phone]   Fax:  [phone]  Name: Vincent Ramos MRN: [id number] Date of Birth: Dec 09, 1950

## 2020-12-18 ENCOUNTER — Other Ambulatory Visit: Payer: Self-pay

## 2020-12-18 ENCOUNTER — Encounter (HOSPITAL_COMMUNITY): Payer: Self-pay | Admitting: Occupational Therapy

## 2020-12-18 ENCOUNTER — Ambulatory Visit (HOSPITAL_COMMUNITY): Payer: Medicare Other | Admitting: Occupational Therapy

## 2020-12-18 DIAGNOSIS — M25611 Stiffness of right shoulder, not elsewhere classified: Secondary | ICD-10-CM

## 2020-12-18 DIAGNOSIS — R29898 Other symptoms and signs involving the musculoskeletal system: Secondary | ICD-10-CM | POA: Diagnosis not present

## 2020-12-18 DIAGNOSIS — M25511 Pain in right shoulder: Secondary | ICD-10-CM

## 2020-12-18 NOTE — Patient Instructions (Signed)
Repeat all exercises 10-15 times, 1-2 times per day.  1) Shoulder Protraction    Begin with elbows by your side, slowly "punch" straight out in front of you.      2) Shoulder Flexion  Supine:     Standing:         Begin with arms at your side with thumbs pointed up, slowly raise both arms up and forward towards overhead.               3) Horizontal abduction/adduction  Supine:   Standing:           Begin with arms straight out in front of you, bring out to the side in at "T" shape. Keep arms straight entire time.                 4) Internal & External Rotation   Supine:     Standing:     Stand with elbows at the side and elbows bent 90 degrees. Move your forearms away from your body, then bring back inward toward the body.     5) Shoulder Abduction  Supine:     Standing:         Exercises Standing Shoulder Row with Anchored Resistance - 1 x daily - 7 x weekly - 3 sets - 10 reps Shoulder extension with resistance - Neutral - 1 x daily - 7 x weekly - 3 sets - 10 reps Shoulder retraction with shoulder abduction using red band same as other exercises.

## 2020-12-18 NOTE — Therapy (Signed)
[facility] [place], [place], [address] Phone: [phone]   Fax:  [phone]  Occupational Therapy Reassessment and Discharge Summary   Patient Details  Name: Vincent Ramos MRN: [id number] Date of Birth: 1951/04/24 Referring Provider (OT): Duffy Rhody, MD   Encounter Date: 12/18/2020   OT End of Session - 12/18/20 1149    Visit Number 4    Number of Visits 4    Date for OT Re-Evaluation 12/22/20    Authorization Type [employer] Medicare    Authorization Time Period No visit limit. No authorization, Copay $30    OT Start Time 1119    OT Stop Time 1141    OT Time Calculation (min) 22 min    Activity Tolerance Patient tolerated treatment well    Behavior During Therapy WFL for tasks assessed/performed           Past Medical History:  Diagnosis Date  . Abnormal nuclear cardiac imaging test 12/27/2017  . Acute on chronic diastolic CHF (congestive heart failure) ([place]) 09/17/2017  . Acute respiratory failure with hypoxia ([place]) 06/01/2017  . Arthritis   . Asthma   . CAD (coronary artery disease)    a. 11/2017: cath showing widely patent coronary arteries with normal left main, 30% mid LAD, luminal irregularities in the proximal and mid circumflex, and 30 to 40% stenosis in the mid RCA.  Marland Kitchen CAP (community acquired pneumonia) 07/31/2014  . Community acquired pneumonia 07/31/2014  . Constipation 02/06/2018  . COPD exacerbation ([place]) 09/17/2017  . Dysphagia   . Elevated troponin 09/17/2017  . Enlarged prostate   . Grade II diastolic dysfunction   . Hypertension   . Lumbar stenosis 10/07/2015  . Obesity 07/31/2014  . PNA (pneumonia) 05/30/2017  . Pneumonia   . Pneumonia   . Polyp of rectum   . Respiratory failure ([place]) 09/18/2017  . SBO (small bowel obstruction) ([place]) 05/08/2019  . Shortness of breath   . Sleep apnea    Sleep Study 08/21/17: Severe OSA  . Type 2 diabetes mellitus ([place])     Past Surgical History:  Procedure  Laterality Date  . ANTERIOR CERVICAL DECOMP/DISCECTOMY FUSION N/A 09/17/2020   Procedure: Anterior Cevrvical Decompresion / Discectomy Fusion Cervical four-five,Cervical five-six,Cervical six- seven;  Surgeon: Vallarie Mare, MD;  Location: [facility];  Service: Neurosurgery;  Laterality: N/A;  . CIRCUMCISION    . COLONOSCOPY WITH PROPOFOL N/A 08/08/2017   Procedure: COLONOSCOPY WITH PROPOFOL;  Surgeon: Danie Binder, MD;  [facility];  Service: Endoscopy;  Laterality: N/A;  1:00pm  . ESOPHAGEAL DILATION N/A 06/07/2017   Procedure: ESOPHAGEAL DILATION;  Surgeon: Daneil Dolin, MD;  [facility];  Service: Endoscopy;  Laterality: N/A;  . ESOPHAGOGASTRODUODENOSCOPY (EGD) WITH PROPOFOL N/A 06/07/2017   Procedure: ESOPHAGOGASTRODUODENOSCOPY (EGD) WITH PROPOFOL;  Surgeon: Daneil Dolin, MD;  [facility];  Service: Endoscopy;  Laterality: N/A;  . HYDROCELE EXCISION  10/14/2011   Procedure: HYDROCELECTOMY ADULT;  Surgeon: Malka So, MD;  [facility];  Service: Urology;  Laterality: Right;  . LACERATION REPAIR     left hand  . LUMBAR LAMINECTOMY/DECOMPRESSION MICRODISCECTOMY Right 10/07/2015   Procedure: Right Lumbar four-five ,lumbar five sacral-one Laminectomy;  Surgeon: Leeroy Cha, MD;  Location: [facility];  Service: Neurosurgery;  Laterality: Right;  . RIGHT/LEFT HEART CATH AND CORONARY ANGIOGRAPHY N/A 12/27/2017   Procedure: RIGHT/LEFT HEART CATH AND CORONARY ANGIOGRAPHY;  Surgeon: Belva Crome, MD;  Location: [facility]  INVASIVE CV LAB;  Service: Cardiovascular;  Laterality: N/A;    There were no vitals filed for this visit.   Subjective Assessment - 12/18/20 1118    Subjective  S: I have a throbbing pain.    Currently in Pain? Yes    Pain Score 4     Pain Location Arm    Pain Orientation Right    Pain Descriptors / Indicators Throbbing;Numbness;Other (Comment)   needles   Pain Type Surgical pain    Pain Radiating Towards shoulder to hand    Pain  Onset More than a month ago    Pain Frequency Occasional    Aggravating Factors  randomly    Pain Relieving Factors taking tramadol    Effect of Pain on Daily Activities minimal    Multiple Pain Sites No              [facility] OT Assessment - 12/18/20 0001      Assessment   Medical Diagnosis S/P spinal stenosis    Referring Provider (OT) Duffy Rhody, MD      Precautions   Precautions None      Coordination   Right 9 Hole Peg Test 24.61"   previous: 25.97"     AROM   Right Shoulder Flexion 154 Degrees   previous: 150 degrees   Right Shoulder ABduction 163 Degrees   previous 160 degrees   Right Shoulder Internal Rotation 60 Degrees   previous: 70 degrees; body structure limitation; WFL for body type   Right Shoulder External Rotation 70 Degrees   previous: 80 degrees     Strength   Right Shoulder Flexion 5/5    Right Shoulder ABduction 5/5    Right Shoulder Internal Rotation 5/5    Right Shoulder External Rotation 5/5    Right Hand Grip (lbs) 68   Previous: 75   Right Hand Lateral Pinch 12 lbs   previous: 12   Right Hand 3 Point Pinch 12 lbs   previous 10   Left Hand Grip (lbs) 120   previous 112   Left Hand Lateral Pinch 26 lbs   previous 26   Left Hand 3 Point Pinch 22 lbs   previous 22                   OT Treatments/Exercises (OP) - 12/18/20 0001      Exercises   Exercises Shoulder;Hand;Theraputty                  OT Education - 12/18/20 1143    Education Details Provided shoulder A/ROM HEP; theraband row, retraction, and extension HEP: educated to continue red putty exercises    Person(s) Educated Patient    Methods Explanation;Demonstration;Handout    Comprehension Verbalized understanding;Returned demonstration            OT Short Term Goals - 12/18/20 1208      OT SHORT TERM GOAL #1   Title Patient will be educated and independent with HEP in order to increase functional use of his right UE and return to using it as his dominant  extremity for all desired tasks.    Time 4    Period Weeks    Status Achieved    Target Date 12/09/20      OT SHORT TERM GOAL #2   Title Patient will increase his right shoulder A/ROM to WNL in order to complete high level reaching tasks with less difficulty.    Baseline withing functional limits  Time 4    Period Weeks    Status Partially Met      OT SHORT TERM GOAL #3   Title Patient will report a decrease in pain level in the RUE of approximately4/10 or less when completing daily tasks.    Baseline reported 4/10 pain this date.    Time 4    Period Weeks    Status Achieved      OT SHORT TERM GOAL #4   Title Patient will increase grip strength by 10# and pinch strength by 5# where needed in order to complete tasks requiring a sustained grip such as holding onto items without dropping and signing his name.    Baseline Met grip strength goal but not pinch.    Time 4    Period Weeks    Status Partially Met                    Plan - 12/18/20 1157    Clinical Impression Statement A: Patient reports decrease in pain at 4/10 this date with continued numbness and shooting pain down right arm. Pt discharged this date per his request. Pt continues to demonstrate shoulder A/ROM [facility]. R Grip strength decreased by 7 from last assessment while 3 point pinch increased by 2 pounds. Pt provided HEP documentation for shoulder A/ROM to continue to increase as well as HEP for theraband row, retraction, and extension. Pt not fully meeting goals but requested to continue rehab via HEP use. Pt did increase grip strength by 10# but pinch is only increased by 2# at this time.    Body Structure / Function / Physical Skills Pain;ROM;Strength;Edema;ADL    OT Treatment/Interventions Self-care/ADL training;Ultrasound;Patient/family education;DME and/or AE instruction;Passive range of motion;Cryotherapy;Electrical Stimulation;Moist Heat;Neuromuscular education;Therapeutic activities;Manual  Therapy;Therapeutic exercise    Plan P: pt discharged.    OT Home Exercise Plan eval: red putty; 5/20 Provided shoulder A/ROM HEP; theraband row, retraction, and extension HEP: educated to continue red putty exercises    Consulted and Agree with Plan of Care Patient          OCCUPATIONAL THERAPY DISCHARGE SUMMARY  Visits from Start of Care: 4  Current functional level related to goals / functional outcomes: WFL A/ROM with improved grip strength. Meeting 2/4 goals with another 2/4 partially met.    Remaining deficits: Pinch strength and A/ROM.    Education / Equipment:eval: red putty; 5/20 Provided shoulder A/ROM HEP; theraband row, retraction, and extension HEP: educated to continue red putty exercises  Plan: Patient agrees to discharge.  Patient goals were partially met. Patient is being discharged due to the patient's request.  ?????      Patient will benefit from skilled therapeutic intervention in order to improve the following deficits and impairments:   Body Structure / Function / Physical Skills: Pain,ROM,Strength,Edema,ADL       Visit Diagnosis: Other symptoms and signs involving the musculoskeletal system  Stiffness of right shoulder, not elsewhere classified  Acute pain of right shoulder    Problem List Patient Active Problem List   Diagnosis Date Noted  . Stenosis of cervical spine with myelopathy ([place]) 09/17/2020  . Cervical stenosis of spine 09/17/2020  . SBO (small bowel obstruction) ([place]) 05/08/2019  . Constipation 02/06/2018  . Abnormal nuclear cardiac imaging test 12/27/2017  . Respiratory failure ([place]) 09/18/2017  . COPD exacerbation ([place]) 09/17/2017  . Elevated troponin 09/17/2017  . Grade II diastolic dysfunction 31/54/0086  . Acute on chronic diastolic CHF (congestive heart failure) ([place]) 09/17/2017  .  Polyp of rectum   . Encounter for screening colonoscopy 07/09/2017  . Aspiration pneumonia ([place]) 06/06/2017  . Dysphagia   . Acute  respiratory failure with hypoxia ([place]) 06/01/2017  . PNA (pneumonia) 05/30/2017  . Lumbar stenosis 10/07/2015  . Type 2 diabetes mellitus ([place]) 07/31/2014  . Hypertension 07/31/2014  . Obesity 07/31/2014  . Community acquired pneumonia 07/31/2014   [facility] OT, MOT  Larey Seat 12/18/2020, 12:10 PM  [facility] [address] [place], [place], [address] Phone: [phone]   Fax:  [phone]  Name: Vincent Ramos MRN: [id number] Date of Birth: [birth year]

## 2020-12-21 ENCOUNTER — Ambulatory Visit (HOSPITAL_COMMUNITY): Payer: Medicare Other

## 2021-01-02 ENCOUNTER — Other Ambulatory Visit: Payer: Self-pay | Admitting: Physician Assistant

## 2021-01-04 ENCOUNTER — Encounter (INDEPENDENT_AMBULATORY_CARE_PROVIDER_SITE_OTHER): Payer: Medicare Other | Admitting: Ophthalmology

## 2021-02-23 ENCOUNTER — Other Ambulatory Visit: Payer: Self-pay | Admitting: Physician Assistant

## 2021-04-16 ENCOUNTER — Other Ambulatory Visit: Payer: Self-pay

## 2021-04-16 ENCOUNTER — Encounter (INDEPENDENT_AMBULATORY_CARE_PROVIDER_SITE_OTHER): Payer: Medicare Other | Admitting: Ophthalmology

## 2021-04-16 DIAGNOSIS — E113391 Type 2 diabetes mellitus with moderate nonproliferative diabetic retinopathy without macular edema, right eye: Secondary | ICD-10-CM | POA: Diagnosis not present

## 2021-04-16 DIAGNOSIS — E113312 Type 2 diabetes mellitus with moderate nonproliferative diabetic retinopathy with macular edema, left eye: Secondary | ICD-10-CM

## 2021-04-16 DIAGNOSIS — H43813 Vitreous degeneration, bilateral: Secondary | ICD-10-CM

## 2021-04-16 DIAGNOSIS — H33301 Unspecified retinal break, right eye: Secondary | ICD-10-CM

## 2021-04-16 DIAGNOSIS — I10 Essential (primary) hypertension: Secondary | ICD-10-CM

## 2021-04-16 DIAGNOSIS — H35033 Hypertensive retinopathy, bilateral: Secondary | ICD-10-CM

## 2021-05-14 ENCOUNTER — Other Ambulatory Visit: Payer: Self-pay

## 2021-05-14 ENCOUNTER — Encounter (INDEPENDENT_AMBULATORY_CARE_PROVIDER_SITE_OTHER): Payer: Medicare Other | Admitting: Ophthalmology

## 2021-05-14 DIAGNOSIS — H35033 Hypertensive retinopathy, bilateral: Secondary | ICD-10-CM

## 2021-05-14 DIAGNOSIS — H33301 Unspecified retinal break, right eye: Secondary | ICD-10-CM

## 2021-05-14 DIAGNOSIS — H43813 Vitreous degeneration, bilateral: Secondary | ICD-10-CM

## 2021-05-14 DIAGNOSIS — I10 Essential (primary) hypertension: Secondary | ICD-10-CM

## 2021-05-14 DIAGNOSIS — E113312 Type 2 diabetes mellitus with moderate nonproliferative diabetic retinopathy with macular edema, left eye: Secondary | ICD-10-CM | POA: Diagnosis not present

## 2021-05-14 DIAGNOSIS — E113291 Type 2 diabetes mellitus with mild nonproliferative diabetic retinopathy without macular edema, right eye: Secondary | ICD-10-CM

## 2021-06-11 ENCOUNTER — Encounter (INDEPENDENT_AMBULATORY_CARE_PROVIDER_SITE_OTHER): Payer: Medicare Other | Admitting: Ophthalmology

## 2021-06-11 ENCOUNTER — Other Ambulatory Visit: Payer: Self-pay

## 2021-06-11 DIAGNOSIS — E113312 Type 2 diabetes mellitus with moderate nonproliferative diabetic retinopathy with macular edema, left eye: Secondary | ICD-10-CM

## 2021-06-11 DIAGNOSIS — H35033 Hypertensive retinopathy, bilateral: Secondary | ICD-10-CM | POA: Diagnosis not present

## 2021-06-11 DIAGNOSIS — E113391 Type 2 diabetes mellitus with moderate nonproliferative diabetic retinopathy without macular edema, right eye: Secondary | ICD-10-CM

## 2021-06-11 DIAGNOSIS — H43813 Vitreous degeneration, bilateral: Secondary | ICD-10-CM

## 2021-06-11 DIAGNOSIS — I10 Essential (primary) hypertension: Secondary | ICD-10-CM

## 2021-07-08 ENCOUNTER — Encounter (INDEPENDENT_AMBULATORY_CARE_PROVIDER_SITE_OTHER): Payer: Medicare Other | Admitting: Ophthalmology

## 2021-07-12 ENCOUNTER — Ambulatory Visit: Payer: Medicare Other | Admitting: Nutrition

## 2021-07-15 ENCOUNTER — Other Ambulatory Visit: Payer: Self-pay

## 2021-07-15 ENCOUNTER — Encounter: Payer: Medicare Other | Attending: Family | Admitting: Nutrition

## 2021-07-15 ENCOUNTER — Encounter: Payer: Self-pay | Admitting: Nutrition

## 2021-07-15 VITALS — Ht 66.5 in | Wt 278.8 lb

## 2021-07-15 DIAGNOSIS — N182 Chronic kidney disease, stage 2 (mild): Secondary | ICD-10-CM | POA: Insufficient documentation

## 2021-07-15 DIAGNOSIS — I10 Essential (primary) hypertension: Secondary | ICD-10-CM | POA: Insufficient documentation

## 2021-07-15 DIAGNOSIS — E118 Type 2 diabetes mellitus with unspecified complications: Secondary | ICD-10-CM | POA: Diagnosis present

## 2021-07-15 NOTE — Progress Notes (Signed)
Medical Nutrition Therapy  Appointment Start time:  0800  Appointment End time:  0930  Primary concerns today: DM Type 2, Obesity Referral diagnosis: E11.8, E66.01 Preferred learning style: No preference (auditory, visual, hands on, no preference indicated) Learning readiness: Ready    NUTRITION ASSESSMENT  Has the [place] and uses it well. TIR 87%. 12% high and 1% low's. He notes he was told his A1C came down, but not sure of the number.  He notes he is always worried about his BS dropping in the middle of the night and so he eats before he goes to bed to keep it from dropping. Has been avoiding carbs and then has to eat candy to keep his BS up between meals and therefore can't lose weight. Has trouble breathing at times. Type 2 for 10 yrs. Meds: Glipizide 10 mg BID,Januvia 100 mg a day, Tresiba, 80 units daily, Trulicity 1.5 mg.  Anthropometrics  Wt Readings from Last 3 Encounters:  09/17/20 282 lb (127.9 kg)  09/15/20 282 lb 4.8 oz (128.1 kg)  05/14/20 275 lb (124.7 kg)   Ht Readings from Last 3 Encounters:  09/17/20 5\' 6"  (1.676 m)  09/15/20 5' 6.5" (1.689 m)  11/11/19 5' 6.5" (1.689 m)   There is no height or weight on file to calculate BMI. @BMIFA @ Facility age limit for growth percentiles is 20 years. Facility age limit for growth percentiles is 20 years.   Lab Results  Component Value Date   HGBA1C 7.7 (H) 09/15/2020     Clinical Medical Hx: HTN, Obesity, DM Medications: Truclity. Labs:   Notable Signs/Symptoms: Shaky at times, weakness  Lifestyle & Dietary Hx LIves with his wife. Retired. Eats 3 meals per day   Estimated daily fluid intake: 64- oz Supplements:  Sleep: good Stress / self-care: no Current average weekly physical activity: ADL  24-Hr Dietary Recall First Meal: Eggs and toast or oatmel Snack: fruit if BS is dropping or candy Second Meal: Sandwich or skips Snack: candy to keep his BS up Third Meal: Meat, vegetables. water Snack: fruit  or nabs, misc to keep from BS dropping in middle of the night Beverages: water  Estimated Energy Needs Calories: 1800 Carbohydrate: 200g Protein: 135g Fat: 50g   NUTRITION DIAGNOSIS  NB-1.1 Food and nutrition-related knowledge deficit As related to Type 2 DM and Obesity.  As evidenced by A1c 7.7% and BMI > 40.   NUTRITION INTERVENTION  Nutrition education (E-1) on the following topics:  Nutrition and Diabetes education provided on My Plate, CHO counting, meal planning, portion sizes, timing of meals, avoiding snacks between meals unless having a low blood sugar, target ranges for A1C and blood sugars, signs/symptoms and treatment of hyper/hypoglycemia, monitoring blood sugars, taking medications as prescribed, benefits of exercising 30 minutes per day and prevention of complications of DM. Lifestyle Medicine - Whole Food, Plant Predominant Nutrition is highly recommended: Eat Plenty of vegetables, Mushrooms, fruits, Legumes, Whole Grains, Nuts, seeds in lieu of processed meats, processed snacks/pastries red meat, poultry, eggs.    -It is better to avoid simple carbohydrates including: Cakes, Sweet Desserts, Ice Cream, Soda (diet and regular), Sweet Tea, Candies, Chips, Cookies, Store Bought Juices, Alcohol in Excess of  1-2 drinks a day, Lemonade,  Artificial Sweeteners, Doughnuts, Coffee Creamers, "Sugar-free" Products, etc, etc.  This is not a complete list.....  Exercise: If you are able: 30 -60 minutes a day ,4 days a week, or 150 minutes a week.  The longer the better.  Combine stretch, strength, and  aerobic activities.  If you were told in the past that you have high risk for cardiovascular diseases, you may seek evaluation by your heart doctor prior to initiating moderate to intense exercise programs.  Handouts Provided Include  My Plate Lifestyle nutrition  Learning Style & Readiness for Change Teaching method utilized: Visual & Auditory  Demonstrated degree of understanding  via: Teach Back  Barriers to learning/adherence to lifestyle change: none  Goals Established by Pt Goals  Eat three meals per a day at times discussed. Try not to eat between meals Drink a gallon of water Try The Power Bowls of [place] Try walking daily Cut out sweets and desserts Get A1C down to 6% Increase lower carb vegetables Follow Plate Method Lifestyle Medicine - Whole Food, Plant Predominant Nutrition is highly recommended: Eat Plenty of vegetables, Mushrooms, fruits, Legumes, Whole Grains, Nuts, seeds in lieu of processed meats, processed snacks/pastries red meat, poultry, eggs.    -It is better to avoid simple carbohydrates including: Cakes, Sweet Desserts, Ice Cream, Soda (diet and regular), Sweet Tea, Candies, Chips, Cookies, Store Bought Juices, Alcohol in Excess of  1-2 drinks a day, Lemonade,  Artificial Sweeteners, Doughnuts, Coffee Creamers, "Sugar-free" Products, etc, etc.  This is not a complete list.....  Exercise: If you are able: 30 -60 minutes a day ,4 days a week, or 150 minutes a week.  The longer the better.  Combine stretch, strength, and aerobic activities.  If you were told in the past that you have high risk for cardiovascular diseases, you may seek evaluation by your heart doctor prior to initiating moderate to intense exercise programs.   MONITORING & EVALUATION Dietary intake, weekly physical activity, and blood sugars and weight in 1 month.  Next Steps  Patient is to work on eating meals on time and let DR know if BS are less than  80 consistently in am or between meals or bedtime. [place] Kitchen

## 2021-07-15 NOTE — Patient Instructions (Addendum)
Goals  Eat three meals per a day at times discussed. Try not to eat between meals Drink a gallon of water Try The Power Bowls of [place] Try walking daily Cut out sweets and desserts Get A1C down to 6% Increase lower carb vegetables Follow Plate Method Lifestyle Medicine - Whole Food, Plant Predominant Nutrition is highly recommended: Eat Plenty of vegetables, Mushrooms, fruits, Legumes, Whole Grains, Nuts, seeds in lieu of processed meats, processed snacks/pastries red meat, poultry, eggs.    -It is better to avoid simple carbohydrates including: Cakes, Sweet Desserts, Ice Cream, Soda (diet and regular), Sweet Tea, Candies, Chips, Cookies, Store Bought Juices, Alcohol in Excess of  1-2 drinks a day, Lemonade,  Artificial Sweeteners, Doughnuts, Coffee Creamers, "Sugar-free" Products, etc, etc.  This is not a complete list.....  Exercise: If you are able: 30 -60 minutes a day ,4 days a week, or 150 minutes a week.  The longer the better.  Combine stretch, strength, and aerobic activities.  If you were told in the past that you have high risk for cardiovascular diseases, you may seek evaluation by your heart doctor prior to initiating moderate to intense exercise programs.

## 2021-08-03 ENCOUNTER — Encounter: Payer: Self-pay | Admitting: Nutrition

## 2021-08-09 DIAGNOSIS — M4712 Other spondylosis with myelopathy, cervical region: Secondary | ICD-10-CM | POA: Diagnosis not present

## 2021-08-13 ENCOUNTER — Ambulatory Visit: Payer: Medicare HMO | Admitting: Interventional Cardiology

## 2021-08-13 NOTE — Progress Notes (Deleted)
Cardiology Office Note   Date:  08/13/2021   ID:  Vincent Ramos, DOB Sep 30, 1950, MRN [id number]  PCP:  Sonia Side., FNP    No chief complaint on file.  HTN   Wt Readings from Last 3 Encounters:  07/15/21 278 lb 12.8 oz (126.5 kg)  09/17/20 282 lb (127.9 kg)  09/15/20 282 lb 4.8 oz (128.1 kg)       History of Present Illness: Vincent Ramos is a 71 y.o. male   who has seen Dr. Johnsie Cancel in the past for hypertension.  He had a cardiac cath in June 2019 showing no significant CAD.  He has had a prior renal duplex showing no renal artery stenosis.  Dr. Nolon Lennert as noted that he has had issues with "compliance and self adjusting medications."   In the past, he reduced hydralazine dose to 100 mg twice daily due to headaches.  He has intermittently take an extra Bystolic in the past when his blood pressure was high.  He stopped his statins due to myalgias.   Dr. Johnsie Cancel reports that the patient did not want to have to take as much medicine.  He also reported that the patient had "lifestyle issues with poor diet and obesity."   At his visit in February 2020 with Dr. Johnsie Cancel, clonidine was stopped.  His Bystolic was changed to labetalol 200 mg p.o. twice daily.  In 4/21, diltiazem was added and low sodium diet was recommended.  She was seen in the PharmD HTN clinic.    In 10/21, it was noted: " seen on 01/13/20 in HTN clinic and BP remained slightly elevated at 134/70. His spironolactone was increased from 12.5mg  to 25mg  daily, however this caused his SCr to increase from 1.17 to 1.5. He was advised to hold his spironolactone for 2 days then reduce back to 12.5mg  daily. Previously could not tolerate TID hydralazine due to headaches but has been tolerating BID dosing. At last visit 7/16, BP was elevated at 951/88 and Bystolic was increased to 40mg  daily." Plan in 10/21 was : "Will continue Bystolic 40mg  daily, spironolactone 12.5mg  daily, hydralazine 100mg  BID (headaches on TID  dosing), olmesartan-HCTZ 40-25mg  daily, Imdur 30mg  daily, and diltiazem 120mg  daily. Encouraged pt to increase walking to 5 days a week for 30 minutes, and limit daily sodium to < 2,000mg  daily. "  Past Medical History:  Diagnosis Date   Abnormal nuclear cardiac imaging test 12/27/2017   Acute on chronic diastolic CHF (congestive heart failure) ([address]) 09/17/2017   Acute respiratory failure with hypoxia (H[facility]) 06/01/2017   Arthritis    Asthma    CAD (coronary artery disease)    a. 11/2017: cath showing widely patent coronary arteries with normal left main, 30% mid LAD, luminal irregularities in the proximal and mid circumflex, and 30 to 40% stenosis in the mid RCA.   CAP (community acquired pneumonia) 07/31/2014   Community acquired pneumonia 07/31/2014   Constipation 02/06/2018   COPD exacerbation ([place]) 09/17/2017   Dysphagia    Elevated troponin 09/17/2017   Enlarged prostate    Grade II diastolic dysfunction    Hypertension    Lumbar stenosis 10/07/2015   Obesity 07/31/2014   PNA (pneumonia) 05/30/2017   Pneumonia    Pneumonia    Polyp of rectum    Respiratory failure ([place]) 09/18/2017   SBO (small bowel obstruction) ([place]) 05/08/2019   Shortness of breath    Sleep apnea    Sleep Study 08/21/17: Severe OSA  Type 2 diabetes mellitus [facility])     Past Surgical History:  Procedure Laterality Date   ANTERIOR CERVICAL DECOMP/DISCECTOMY FUSION N/A 09/17/2020   Procedure: Anterior Cevrvical Decompresion / Discectomy Fusion Cervical four-five,Cervical five-six,Cervical six- seven;  Surgeon: Vallarie Mare, MD;  Location: [place];  Service: Neurosurgery;  Laterality: N/A;   CIRCUMCISION     COLONOSCOPY WITH PROPOFOL N/A 08/08/2017   Procedure: COLONOSCOPY WITH PROPOFOL;  Surgeon: Danie Binder, MD;  [facility];  Service: Endoscopy;  Laterality: N/A;  1:00pm   ESOPHAGEAL DILATION N/A 06/07/2017   Procedure: ESOPHAGEAL DILATION;  Surgeon: Daneil Dolin, MD;  [facility];   Service: Endoscopy;  Laterality: N/A;   ESOPHAGOGASTRODUODENOSCOPY (EGD) WITH PROPOFOL N/A 06/07/2017   Procedure: ESOPHAGOGASTRODUODENOSCOPY (EGD) WITH PROPOFOL;  Surgeon: Daneil Dolin, MD;  [facility];  Service: Endoscopy;  Laterality: N/A;   HYDROCELE EXCISION  10/14/2011   Procedure: HYDROCELECTOMY ADULT;  Surgeon: Malka So, MD;  [facility];  Service: Urology;  Laterality: Right;   LACERATION REPAIR     left hand   LUMBAR LAMINECTOMY/DECOMPRESSION MICRODISCECTOMY Right 10/07/2015   Procedure: Right Lumbar four-five ,lumbar five sacral-one Laminectomy;  Surgeon: Leeroy Cha, MD;  Location: [place] [facility];  Service: Neurosurgery;  Laterality: Right;   RIGHT/LEFT HEART CATH AND CORONARY ANGIOGRAPHY N/A 12/27/2017   Procedure: RIGHT/LEFT HEART CATH AND CORONARY ANGIOGRAPHY;  Surgeon: Belva Crome, MD;  Location: [place] [facility];  Service: Cardiovascular;  Laterality: N/A;     Current Outpatient Medications  Medication Sig Dispense Refill   albuterol (VENTOLIN HFA) 108 (90 Base) MCG/ACT inhaler Inhale 2 puffs into the lungs every 6 (six) hours as needed for wheezing or shortness of breath.     aspirin EC 81 MG tablet Take 1 tablet (81 mg total) by mouth daily with breakfast. 30 tablet 4   Aspirin-Caffeine (BC FAST PAIN RELIEF) 845-65 MG PACK Take 1 packet by mouth daily as needed (pain). (Patient not taking: Reported on 11/11/2020)     azelastine (ASTELIN) 0.1 % nasal spray Place 2 sprays into both nostrils 2 (two) times daily. Use in each nostril as directed (Patient taking differently: Place 2 sprays into both nostrils 2 (two) times daily as needed for allergies. Use in each nostril as directed) 30 mL 12   azelastine (OPTIVAR) 0.05 % ophthalmic solution Place 1 drop into the left eye See admin instructions. Instill 1 drop into both eyes twice daily for 2 days the day of and the day after monthly eye injections     BYSTOLIC 20 MG TABS TAKE 2 TABLETS BY MOUTH DAILY. 180  tablet 0   diltiazem (CARDIZEM CD) 120 MG 24 hr capsule Take 1 capsule (120 mg total) by mouth daily. 90 capsule 3   diltiazem (CARDIZEM CD) 360 MG 24 hr capsule Take 1 capsule (360 mg total) by mouth daily. 1 capsule 30   docusate sodium (COLACE) 100 MG capsule Take 1 capsule (100 mg total) by mouth 2 (two) times daily. (Patient not taking: Reported on 11/11/2020) 60 capsule 2   Fluticasone-Umeclidin-Vilant (TRELEGY ELLIPTA) 100-62.5-25 MCG/INH AEPB Inhale 1 puff into the lungs daily.     furosemide (LASIX) 40 MG tablet Take 40 mg by mouth daily as needed for edema.     gabapentin (NEURONTIN) 300 MG capsule Take 100 mg by mouth 3 (three) times daily.      glipiZIDE (GLUCOTROL) 10 MG tablet Take 10 mg by mouth 2 (two) times daily.  hydrALAZINE (APRESOLINE) 100 MG tablet Take 1 tablet (100 mg total) by mouth 3 (three) times daily. With meals. Please make overdue appt with Dr. Irish Lack before anymore refills. Thank you 1st attempt 90 tablet 0   isosorbide mononitrate (IMDUR) 30 MG 24 hr tablet Take 1 tablet (30 mg total) by mouth daily. 30 tablet 5   Multiple Vitamin (MULTIVITAMIN WITH MINERALS) TABS tablet Take 1 tablet by mouth daily.     olmesartan-hydrochlorothiazide (BENICAR HCT) 40-25 MG tablet Take 1 tablet by mouth daily. 90 tablet 3   oxyCODONE-acetaminophen (PERCOCET/ROXICET) 5-325 MG tablet Take 1-2 tablets by mouth every 6 (six) hours as needed for moderate pain or severe pain. (Patient not taking: Reported on 11/11/2020) 45 tablet 0   polyethylene glycol (MIRALAX / GLYCOLAX) 17 g packet Take 17 g by mouth daily. (Patient taking differently: Take 17 g by mouth daily as needed for moderate constipation.) 30 each 2   rosuvastatin (CRESTOR) 40 MG tablet Take 40 mg by mouth daily.     sitaGLIPtin (JANUVIA) 100 MG tablet Take 100 mg by mouth daily.     spironolactone (ALDACTONE) 25 MG tablet Take 0.5 tablets (12.5 mg total) by mouth daily. 45 tablet 3   tamsulosin (FLOMAX) 0.4 MG CAPS capsule  Take 0.4 mg by mouth daily.     TRESIBA FLEXTOUCH 100 UNIT/ML FlexTouch Pen Inject 80 Units into the skin daily.     TRULICITY 1.5 WU/9.8JX SOPN Inject 1.5 mg into the skin every Sunday.     No current facility-administered medications for this visit.    Allergies:   Amlodipine, Novolog [insulin aspart], and Victoza [liraglutide]    Social History:  The patient  reports that he has never smoked. He has never used smokeless tobacco. He reports that he does not drink alcohol and does not use drugs.   Family History:  The patient's ***family history includes CAD in an other family member; Cancer in an other family member; Diabetes in his sister and another family member; Hypertension in his sister and another family member; Lung cancer in his mother.    ROS:  Please see the history of present illness.   Otherwise, review of systems are positive for ***.   All other systems are reviewed and negative.    PHYSICAL EXAM: VS:  There were no vitals taken for this visit. , BMI There is no height or weight on file to calculate BMI. GEN: Well nourished, well developed, in no acute distress HEENT: normal Neck: no JVD, carotid bruits, or masses Cardiac: ***RRR; no murmurs, rubs, or gallops,no edema  Respiratory:  clear to auscultation bilaterally, normal work of breathing GI: soft, nontender, nondistended, + BS MS: no deformity or atrophy Skin: warm and dry, no rash Neuro:  Strength and sensation are intact Psych: euthymic mood, full affect   EKG:   The ekg ordered today demonstrates ***   Recent Labs: 09/17/2020: BUN 16; Creatinine, Ser 1.38; Platelets 121 09/19/2020: Hemoglobin 15.3; Potassium 3.6; Sodium 135   Lipid Panel    Component Value Date/Time   CHOL 193 01/04/2013 0804   TRIG 129 01/04/2013 0804   HDL 45 01/04/2013 0804   CHOLHDL 4.3 01/04/2013 0804   VLDL 26 01/04/2013 0804   LDLCALC 122 (H) 01/04/2013 0804     Other studies Reviewed: Additional studies/ records that  were reviewed today with results demonstrating: ***.   ASSESSMENT AND PLAN:  Hypertensive heart disease: DM: Morbid obesity:  CAD: mild by 2019 cath.  Chronic diastolic heart failure:  Current medicines are reviewed at length with the patient today.  The patient concerns regarding his medicines were addressed.  The following changes have been made:  No change***  Labs/ tests ordered today include: *** No orders of the defined types were placed in this encounter.   Recommend 150 minutes/week of aerobic exercise Low fat, low carb, high fiber diet recommended  Disposition:   FU in ***   Signed, Larae Grooms, MD  08/13/2021 12:03 AM    [facility] [address], [place], [place]  [address] Phone: [phone]; Fax: [phone]

## 2021-08-18 NOTE — Progress Notes (Signed)
Cardiology Office Note   Date:  08/20/2021   ID:  Vincent Ramos, DOB July 31, 1951, MRN [id number]  PCP:  Sonia Side., FNP    No chief complaint on file.  HTN  Wt Readings from Last 3 Encounters:  08/20/21 274 lb (124.3 kg)  07/15/21 278 lb 12.8 oz (126.5 kg)  09/17/20 282 lb (127.9 kg)       History of Present Illness: Vincent Ramos is a 71 y.o. male  who has seen Dr. Johnsie Cancel in the past for hypertension.  He had a cardiac cath in June 2019 showing no significant CAD.  He has had a prior renal duplex showing no renal artery stenosis.  Dr. Nolon Lennert as noted that he has had issues with "compliance and self adjusting medications."   He has reduced hydralazine dose to 100 mg twice daily due to headaches.  He has intermittently take an extra Bystolic in the past when his blood pressure was high.  He stopped his statins due to myalgias.   Dr. Johnsie Cancel reports that the patient did not want to have to take as much medicine.  He also reported that the patient had "lifestyle issues with poor diet and obesity."   At his visit in February 2020 with Dr. Johnsie Cancel, clonidine was stopped.  His Bystolic was changed to labetalol 200 mg p.o. twice daily.   In April 2021, I saw him for the first time and recommended: "Low-sodium diet stressed.  Whole food, plant-based diet recommended.  Will add diltiazem 120 mg daily to see if this helps with BP and with palpitations.  May need to consider stronger diuretic given LE swelling.  F/u in PharmD HTN clinic.  Swelling already present before diltiazem.; Statin has been recommended but he stopped this due to perceived myalgias. Now tolerating rosuvastatin.  Mild fluid overload in legs.  Reduce salt intake.  COntinue HCTZ.  COnsider switch to furosemide if increased volume overload.  I stressed the importance of diet and weight loss. A1C 7.2 in 06/2019.  Weight loss  would help DM. "  Seen in the Pharm.D. hypertension clinic in October 2021: "Will continue  Bystolic 40mg  daily, spironolactone 12.5mg  daily, hydralazine 100mg  BID (headaches on TID dosing), olmesartan-HCTZ 40-25mg  daily, Imdur 30mg  daily, and diltiazem 120mg  daily. Encouraged pt to increase walking to 5 days a week for 30 minutes, and limit daily sodium to < 2,000mg  daily. "  BP has been reasonably controlled.  Systolics in the 623J-628B.    Has not taken diltiazem yet today.   Past Medical History:  Diagnosis Date   Abnormal nuclear cardiac imaging test 12/27/2017   Acute on chronic diastolic CHF (congestive heart failure) ([place]) 09/17/2017   Acute respiratory failure with hypoxia (H[facility]) 06/01/2017   Arthritis    Asthma    CAD (coronary artery disease)    a. 11/2017: cath showing widely patent coronary arteries with normal left main, 30% mid LAD, luminal irregularities in the proximal and mid circumflex, and 30 to 40% stenosis in the mid RCA.   CAP (community acquired pneumonia) 07/31/2014   Community acquired pneumonia 07/31/2014   Constipation 02/06/2018   COPD exacerbation ([place]) 09/17/2017   Dysphagia    Elevated troponin 09/17/2017   Enlarged prostate    Grade II diastolic dysfunction    Hypertension    Lumbar stenosis 10/07/2015   Obesity 07/31/2014   PNA (pneumonia) 05/30/2017   Pneumonia    Pneumonia    Polyp of rectum    Respiratory  failure ([place]) 09/18/2017   SBO (small bowel obstruction) ([place]) 05/08/2019   Shortness of breath    Sleep apnea    Sleep Study 08/21/17: Severe OSA   Type 2 diabetes mellitus [facility])     Past Surgical History:  Procedure Laterality Date   ANTERIOR CERVICAL DECOMP/DISCECTOMY FUSION N/A 09/17/2020   Procedure: Anterior Cevrvical Decompresion / Discectomy Fusion Cervical four-five,Cervical five-six,Cervical six- seven;  Surgeon: Vallarie Mare, MD;  Location: [place];  Service: Neurosurgery;  Laterality: N/A;   CIRCUMCISION     COLONOSCOPY WITH PROPOFOL N/A 08/08/2017   Procedure: COLONOSCOPY WITH PROPOFOL;  Surgeon: Danie Binder, MD;   [facility];  Service: Endoscopy;  Laterality: N/A;  1:00pm   ESOPHAGEAL DILATION N/A 06/07/2017   Procedure: ESOPHAGEAL DILATION;  Surgeon: Daneil Dolin, MD;  [facility];  Service: Endoscopy;  Laterality: N/A;   ESOPHAGOGASTRODUODENOSCOPY (EGD) WITH PROPOFOL N/A 06/07/2017   Procedure: ESOPHAGOGASTRODUODENOSCOPY (EGD) WITH PROPOFOL;  Surgeon: Daneil Dolin, MD;  [facility];  Service: Endoscopy;  Laterality: N/A;   HYDROCELE EXCISION  10/14/2011   Procedure: HYDROCELECTOMY ADULT;  Surgeon: Malka So, MD;  [facility];  Service: Urology;  Laterality: Right;   LACERATION REPAIR     left hand   LUMBAR LAMINECTOMY/DECOMPRESSION MICRODISCECTOMY Right 10/07/2015   Procedure: Right Lumbar four-five ,lumbar five sacral-one Laminectomy;  Surgeon: Leeroy Cha, MD;  Location: [place] [facility];  Service: Neurosurgery;  Laterality: Right;   RIGHT/LEFT HEART CATH AND CORONARY ANGIOGRAPHY N/A 12/27/2017   Procedure: RIGHT/LEFT HEART CATH AND CORONARY ANGIOGRAPHY;  Surgeon: Belva Crome, MD;  Location: [place] [facility];  Service: Cardiovascular;  Laterality: N/A;     Current Outpatient Medications  Medication Sig Dispense Refill   albuterol (VENTOLIN HFA) 108 (90 Base) MCG/ACT inhaler Inhale 2 puffs into the lungs every 6 (six) hours as needed for wheezing or shortness of breath.     aspirin EC 81 MG tablet Take 1 tablet (81 mg total) by mouth daily with breakfast. 30 tablet 4   azelastine (ASTELIN) 0.1 % nasal spray Place 2 sprays into both nostrils 2 (two) times daily. Use in each nostril as directed (Patient taking differently: Place 2 sprays into both nostrils 2 (two) times daily as needed for allergies. Use in each nostril as directed) 30 mL 12   azelastine (OPTIVAR) 0.05 % ophthalmic solution Place 1 drop into the left eye See admin instructions. Instill 1 drop into both eyes twice daily for 2 days the day of and the day after monthly eye injections      BYSTOLIC 20 MG TABS TAKE 2 TABLETS BY MOUTH DAILY. 180 tablet 0   diltiazem (CARDIZEM CD) 360 MG 24 hr capsule Take 1 capsule (360 mg total) by mouth daily. 1 capsule 30   Fluticasone-Umeclidin-Vilant (TRELEGY ELLIPTA) 100-62.5-25 MCG/INH AEPB Inhale 1 puff into the lungs daily.     furosemide (LASIX) 40 MG tablet Take 40 mg by mouth daily as needed for edema.     gabapentin (NEURONTIN) 300 MG capsule Take 100 mg by mouth 3 (three) times daily.      glipiZIDE (GLUCOTROL) 10 MG tablet Take 10 mg by mouth 2 (two) times daily.     hydrALAZINE (APRESOLINE) 100 MG tablet Take 1 tablet (100 mg total) by mouth 3 (three) times daily. With meals. Please make overdue appt with Dr. Irish Lack before anymore refills. Thank you 1st attempt 90 tablet 0   isosorbide mononitrate (IMDUR) 30 MG  24 hr tablet Take 1 tablet (30 mg total) by mouth daily. 30 tablet 5   Multiple Vitamin (MULTIVITAMIN WITH MINERALS) TABS tablet Take 1 tablet by mouth daily.     olmesartan-hydrochlorothiazide (BENICAR HCT) 40-25 MG tablet Take 1 tablet by mouth daily. 90 tablet 3   polyethylene glycol (MIRALAX / GLYCOLAX) 17 g packet Take 17 g by mouth daily. (Patient taking differently: Take 17 g by mouth daily as needed for moderate constipation.) 30 each 2   rosuvastatin (CRESTOR) 40 MG tablet Take 40 mg by mouth daily.     sitaGLIPtin (JANUVIA) 100 MG tablet Take 100 mg by mouth daily.     spironolactone (ALDACTONE) 25 MG tablet Take 0.5 tablets (12.5 mg total) by mouth daily. 45 tablet 3   tamsulosin (FLOMAX) 0.4 MG CAPS capsule Take 0.4 mg by mouth daily.     TRESIBA FLEXTOUCH 100 UNIT/ML FlexTouch Pen Inject 80 Units into the skin daily.     TRULICITY 1.5 HU/7.6LY SOPN Inject 1.5 mg into the skin every Sunday.     No current facility-administered medications for this visit.    Allergies:   Amlodipine, Novolog [insulin aspart], and Victoza [liraglutide]    Social History:  The patient  reports that he has never smoked. He has  never used smokeless tobacco. He reports that he does not drink alcohol and does not use drugs.   Family History:  The patient's family history includes CAD in an other family member; Cancer in an other family member; Diabetes in his sister and another family member; Hypertension in his sister and another family member; Lung cancer in his mother.    ROS:  Please see the history of present illness.   Otherwise, review of systems are positive for .   All other systems are reviewed and negative.    PHYSICAL EXAM: VS:  BP (!) 146/70    Pulse 75    Ht 5' 6.5" (1.689 m)    Wt 274 lb (124.3 kg)    SpO2 96%    BMI 43.56 kg/m  , BMI Body mass index is 43.56 kg/m. GEN: Well nourished, well developed, in no acute distress HEENT: normal Neck: no JVD, carotid bruits, or masses Cardiac: RRR; no murmurs, rubs, or gallops,; minimal leg edema  Respiratory:  clear to auscultation bilaterally, normal work of breathing GI: soft, nontender, nondistended, + BS MS: no deformity or atrophy Skin: warm and dry, no rash Neuro:  Strength and sensation are intact Psych: euthymic mood, full affect   EKG:   The ekg ordered today demonstrates normal ECG, no ST segment changes   Recent Labs: 09/17/2020: BUN 16; Creatinine, Ser 1.38; Platelets 121 09/19/2020: Hemoglobin 15.3; Potassium 3.6; Sodium 135   Lipid Panel    Component Value Date/Time   CHOL 193 01/04/2013 0804   TRIG 129 01/04/2013 0804   HDL 45 01/04/2013 0804   CHOLHDL 4.3 01/04/2013 0804   VLDL 26 01/04/2013 0804   LDLCALC 122 (H) 01/04/2013 0804     Other studies Reviewed: Additional studies/ records that were reviewed today with results demonstrating: labs from 2022 reviewed.   ASSESSMENT AND PLAN:  Hypertensive heart disease: Taking 20 mg Bystolic. BP in the 650-354 range systolic.  Diltiazem was increased to 360 mg daily.  Blood pressure reasonably well controlled in the 656C and 127N systolic.  Ideally would like to see his systolic  less than 170.  He has not taken his diltiazem yet today.   Continue efforts to lose weight.  This will help him to be able to reduce his medicines.  We will have him bring blood pressure readings and follow-up with our Pharm.D. hypertension clinic in 3 months. CAD: No angina.  Has some atypical chest pains with negative ischemic work-up in the past.  No exertional symptoms. Hyperlipidemia: We will try to obtain more recent lab work from Dr. Thompson Caul office.  Continue high intensity rosuvastatin.  He was nonfasting today. Chronic diastolic heart failure: He appears euvolemic today. Diabetes: He states A1C was 7.1.  Avoid processed foods.  High-fiber diet.  He is following with a nutritionist. Morbid obesity: He has lost about 8 pounds over the past year.  Hopefully, this trend will continue.  We spoke about diet and regular exercise.   Current medicines are reviewed at length with the patient today.  The patient concerns regarding his medicines were addressed.  The following changes have been made:  No change  Labs/ tests ordered today include:  No orders of the defined types were placed in this encounter.   Recommend 150 minutes/week of aerobic exercise Low fat, low carb, high fiber diet recommended  Disposition:   FU in 1 year with me   Signed, Larae Grooms, MD  08/20/2021 8:27 AM    [facility] [place], [place], [place]  [address] Phone: [phone]; Fax: [phone]

## 2021-08-20 ENCOUNTER — Encounter: Payer: Self-pay | Admitting: Interventional Cardiology

## 2021-08-20 ENCOUNTER — Encounter (INDEPENDENT_AMBULATORY_CARE_PROVIDER_SITE_OTHER): Payer: Self-pay

## 2021-08-20 ENCOUNTER — Ambulatory Visit: Payer: Medicare HMO | Admitting: Interventional Cardiology

## 2021-08-20 ENCOUNTER — Other Ambulatory Visit: Payer: Self-pay

## 2021-08-20 VITALS — BP 146/70 | HR 75 | Ht 66.5 in | Wt 274.0 lb

## 2021-08-20 DIAGNOSIS — I119 Hypertensive heart disease without heart failure: Secondary | ICD-10-CM

## 2021-08-20 DIAGNOSIS — I10 Essential (primary) hypertension: Secondary | ICD-10-CM | POA: Diagnosis not present

## 2021-08-20 DIAGNOSIS — I5032 Chronic diastolic (congestive) heart failure: Secondary | ICD-10-CM | POA: Diagnosis not present

## 2021-08-20 DIAGNOSIS — E785 Hyperlipidemia, unspecified: Secondary | ICD-10-CM | POA: Diagnosis not present

## 2021-08-20 DIAGNOSIS — I2510 Atherosclerotic heart disease of native coronary artery without angina pectoris: Secondary | ICD-10-CM | POA: Diagnosis not present

## 2021-08-20 NOTE — Patient Instructions (Addendum)
Medication Instructions:  Your physician recommends that you continue on your current medications as directed. Please refer to the Current Medication list given to you today.  *If you need a refill on your cardiac medications before your next appointment, please call your pharmacy*   Lab Work: none If you have labs (blood work) drawn today and your tests are completely normal, you will receive your results only by: [place] (if you have MyChart) OR A paper copy in the mail If you have any lab test that is abnormal or we need to change your treatment, we will call you to review the results.   Testing/Procedures: none   Follow-Up: At [facility], you and your health needs are our priority.  As part of our continuing mission to provide you with exceptional heart care, we have created designated Provider Care Teams.  These Care Teams include your primary Cardiologist (physician) and Advanced Practice Providers (APPs -  Physician Assistants and Nurse Practitioners) who all work together to provide you with the care you need, when you need it.  We recommend signing up for the patient portal called "MyChart".  Sign up information is provided on this After Visit Summary.  MyChart is used to connect with patients for Virtual Visits (Telemedicine).  Patients are able to view lab/test results, encounter notes, upcoming appointments, etc.  Non-urgent messages can be sent to your provider as well.   To learn more about what you can do with MyChart, go to [email or web address].    Your next appointment:   12 month(s)  The format for your next appointment:   In Person  Provider:   Larae Grooms, MD     Other Instructions You have been referred to hypertension clinic in our office.  Please schedule appointment for 3 months from now.  Check blood pressure at home and bring readings to this appointment.  Also bring blood pressure cuff to appointment  High-Fiber Eating Plan Fiber,  also called dietary fiber, is a type of carbohydrate. It is found foods such as fruits, vegetables, whole grains, and beans. A high-fiber diet can have many health benefits. Your health care provider may recommend a high-fiber diet to help: Prevent constipation. Fiber can make your bowel movements more regular. Lower your cholesterol. Relieve the following conditions: Inflammation of veins in the anus (hemorrhoids). Inflammation of specific areas of the digestive tract (uncomplicated diverticulosis). A problem of the large intestine, also called the colon, that sometimes causes pain and diarrhea (irritable bowel syndrome, or IBS). Prevent overeating as part of a weight-loss plan. Prevent heart disease, type 2 diabetes, and certain cancers. What are tips for following this plan? Reading food labels  Check the nutrition facts label on food products for the amount of dietary fiber. Choose foods that have 5 grams of fiber or more per serving. The goals for recommended daily fiber intake include: Men (age 85 or younger): 34-38 g. Men (over age 60): 28-34 g. Women (age 67 or younger): 25-28 g. Women (over age 83): 22-25 g. Your daily fiber goal is _____________ g. Shopping Choose whole fruits and vegetables instead of processed forms, such as apple juice or applesauce. Choose a wide variety of high-fiber foods such as avocados, lentils, oats, and kidney beans. Read the nutrition facts label of the foods you choose. Be aware of foods with added fiber. These foods often have high sugar and sodium amounts per serving. Cooking Use whole-grain flour for baking and cooking. Cook with brown rice instead of white rice.  Meal planning Start the day with a breakfast that is high in fiber, such as a cereal that contains 5 g of fiber or more per serving. Eat breads and cereals that are made with whole-grain flour instead of refined flour or white flour. Eat brown rice, bulgur wheat, or millet instead of  white rice. Use beans in place of meat in soups, salads, and pasta dishes. Be sure that half of the grains you eat each day are whole grains. General information You can get the recommended daily intake of dietary fiber by: Eating a variety of fruits, vegetables, grains, nuts, and beans. Taking a fiber supplement if you are not able to take in enough fiber in your diet. It is better to get fiber through food than from a supplement. Gradually increase how much fiber you consume. If you increase your intake of dietary fiber too quickly, you may have bloating, cramping, or gas. Drink plenty of water to help you digest fiber. Choose high-fiber snacks, such as berries, raw vegetables, nuts, and popcorn. What foods should I eat? Fruits Berries. Pears. Apples. Oranges. Avocado. Prunes and raisins. Dried figs. Vegetables Sweet potatoes. Spinach. Kale. Artichokes. Cabbage. Broccoli. Cauliflower. Green peas. Carrots. Squash. Grains Whole-grain breads. Multigrain cereal. Oats and oatmeal. Brown rice. Barley. Bulgur wheat. [place]. Quinoa. Bran muffins. Popcorn. Rye wafer crackers. Meats and other proteins Navy beans, kidney beans, and pinto beans. Soybeans. Split peas. Lentils. Nuts and seeds. Dairy Fiber-fortified yogurt. Beverages Fiber-fortified soy milk. Fiber-fortified orange juice. Other foods Fiber bars. The items listed above may not be a complete list of recommended foods and beverages. Contact a dietitian for more information. What foods should I avoid? Fruits Fruit juice. Cooked, strained fruit. Vegetables Fried potatoes. Canned vegetables. Well-cooked vegetables. Grains White bread. Pasta made with refined flour. White rice. Meats and other proteins Fatty cuts of meat. Fried chicken or fried fish. Dairy Milk. Yogurt. Cream cheese. Sour cream. Fats and oils Butters. Beverages Soft drinks. Other foods Cakes and pastries. The items listed above may not be a complete list of  foods and beverages to avoid. Talk with your dietitian about what choices are best for you. Summary Fiber is a type of carbohydrate. It is found in foods such as fruits, vegetables, whole grains, and beans. A high-fiber diet has many benefits. It can help to prevent constipation, lower blood cholesterol, aid weight loss, and reduce your risk of heart disease, diabetes, and certain cancers. Increase your intake of fiber gradually. Increasing fiber too quickly may cause cramping, bloating, and gas. Drink plenty of water while you increase the amount of fiber you consume. The best sources of fiber include whole fruits and vegetables, whole grains, nuts, seeds, and beans. This information is not intended to replace advice given to you by your health care provider. Make sure you discuss any questions you have with your health care provider. Document Revised: 11/21/2019 Document Reviewed: 11/21/2019 Elsevier Patient Education  2022 [employer].

## 2021-09-01 ENCOUNTER — Encounter: Payer: Medicare Other | Attending: Family | Admitting: Nutrition

## 2021-09-01 ENCOUNTER — Encounter: Payer: Self-pay | Admitting: Nutrition

## 2021-09-01 ENCOUNTER — Other Ambulatory Visit: Payer: Self-pay

## 2021-09-01 VITALS — Ht 66.5 in | Wt 277.0 lb

## 2021-09-01 DIAGNOSIS — E782 Mixed hyperlipidemia: Secondary | ICD-10-CM | POA: Diagnosis present

## 2021-09-01 DIAGNOSIS — E118 Type 2 diabetes mellitus with unspecified complications: Secondary | ICD-10-CM | POA: Insufficient documentation

## 2021-09-01 DIAGNOSIS — N182 Chronic kidney disease, stage 2 (mild): Secondary | ICD-10-CM | POA: Insufficient documentation

## 2021-09-01 DIAGNOSIS — I10 Essential (primary) hypertension: Secondary | ICD-10-CM | POA: Diagnosis present

## 2021-09-01 NOTE — Progress Notes (Signed)
Medical Nutrition Therapy  Appointment Start time:  53  Appointment End time:  1100  Primary concerns today: DM Type 2, Obesity Referral diagnosis: E11.8, E66.01 Preferred learning style: No preference (auditory, visual, hands on, no preference indicated) Learning readiness: Ready     NUTRITION ASSESSMENT    Has the [place] and uses it well. 82% TIR, 18% TAR.  Doing very well. He complains of his BS dropping in the 60's in middle of night and  having to eat to bring BS up often. Compliant with medications. Would benefit from a reducing in his insulin and his Glipizide to prevent hypoglycemia and encourage further weight loss.  Feels better. Breathing is better . Meds: Glipizide 10 mg BID,Januvia 100 mg a day, Tresiba, 80 units daily, Trulicity 1.5 mg.  Anthropometrics  Wt Readings from Last 3 Encounters:  08/20/21 274 lb (124.3 kg)  07/15/21 278 lb 12.8 oz (126.5 kg)  09/17/20 282 lb (127.9 kg)   Ht Readings from Last 3 Encounters:  08/20/21 5' 6.5" (1.689 m)  07/15/21 5' 6.5" (1.689 m)  09/17/20 5\' 6"  (1.676 m)   There is no height or weight on file to calculate BMI. @BMIFA @ Facility age limit for growth percentiles is 20 years. Facility age limit for growth percentiles is 20 years.   Lab Results  Component Value Date   HGBA1C 7.7 (H) 09/15/2020     Clinical Medical Hx: HTN, Obesity, DM Medications: Truclity. Labs:   Notable Signs/Symptoms: Shaky at times, weakness  Lifestyle & Dietary Hx LIves with his wife. Retired. Eats 3 meals per day   Estimated daily fluid intake: 64- oz Supplements:  Sleep: good Stress / self-care: no Current average weekly physical activity: ADL  24-Hr Dietary Recall First Meal: Eggs and toast or oatmel Snack:  Second Meal: Sandwich or skips Snack: Third Meal: Meat, vegetables. water Snack: t Beverages: water  Estimated Energy Needs Calories: 1800 Carbohydrate: 200g Protein: 135g Fat: 50g   NUTRITION DIAGNOSIS   NB-1.1 Food and nutrition-related knowledge deficit As related to Type 2 DM and Obesity.  As evidenced by A1c 7.7% and BMI > 40.   NUTRITION INTERVENTION  Nutrition education (E-1) on the following topics:  . Lifestyle Medicine - Whole Food, Plant Predominant Nutrition is highly recommended: Eat Plenty of vegetables, Mushrooms, fruits, Legumes, Whole Grains, Nuts, seeds in lieu of processed meats, processed snacks/pastries red meat, poultry, eggs.    -It is better to avoid simple carbohydrates including: Cakes, Sweet Desserts, Ice Cream, Soda (diet and regular), Sweet Tea, Candies, Chips, Cookies, Store Bought Juices, Alcohol in Excess of  1-2 drinks a day, Lemonade,  Artificial Sweeteners, Doughnuts, Coffee Creamers, "Sugar-free" Products, etc, etc.  This is not a complete list.....  Exercise: If you are able: 30 -60 minutes a day ,4 days a week, or 150 minutes a week.  The longer the better.  Combine stretch, strength, and aerobic activities.  If you were told in the past that you have high risk for cardiovascular diseases, you may seek evaluation by your heart doctor prior to initiating moderate to intense exercise programs.  Handouts Provided Include  My Plate Lifestyle nutrition  Learning Style & Readiness for Change Teaching method utilized: Visual & Auditory  Demonstrated degree of understanding via: Teach Back  Barriers to learning/adherence to lifestyle change: none  Goals Established by Pt Goals Keep up the good job! Goals  Increase fiber rich foods Cut out sweets between meals IF if BS less than 70 -2-3 times a week, call  MD to reduce Tresiba and or Glipizide. Walk 30 minutes a day if you can for beneficial weight loss Avoid processed foods-pizza, fast foods, junk food Lose 2-3 lbs per month..  Lifestyle Medicine - Whole Food, Plant Predominant Nutrition is highly recommended: Eat Plenty of vegetables, Mushrooms, fruits, Legumes, Whole Grains, Nuts, seeds in lieu of  processed meats, processed snacks/pastries red meat, poultry, eggs.    -It is better to avoid simple carbohydrates including: Cakes, Sweet Desserts, Ice Cream, Soda (diet and regular), Sweet Tea, Candies, Chips, Cookies, Store Bought Juices, Alcohol in Excess of  1-2 drinks a day, Lemonade,  Artificial Sweeteners, Doughnuts, Coffee Creamers, "Sugar-free" Products, etc, etc.  This is not a complete list.....  Exercise: If you are able: 30 -60 minutes a day ,4 days a week, or 150 minutes a week.  The longer the better.  Combine stretch, strength, and aerobic activities.  If you were told in the past that you have high risk for cardiovascular diseases, you may seek evaluation by your heart doctor prior to initiating moderate to intense exercise programs.   MONITORING & EVALUATION Dietary intake, weekly physical activity, and blood sugars and weight in 1 month.  Recommend to reduce basal insulin to 70 units and reduce Glipizide to once a day to reduce hypoglycemia episodes so he doesn't have to keep eating to keep BS up , and also is preventing further weight loss.   Next Steps  Patient is to work on eating meals on time

## 2021-09-01 NOTE — Patient Instructions (Addendum)
Goals  Increase fiber rich foods Cut out sweets between meals IF if BS less than 70 -2-3 times a week, call MD to reduce Tresiba and or Glipizide. Walk 30 minutes a day if you can for beneficial weight loss Avoid processed foods-pizza, fast foods, junk food Lose 2-3 lbs per month.Marland Kitchen

## 2021-09-09 ENCOUNTER — Encounter (INDEPENDENT_AMBULATORY_CARE_PROVIDER_SITE_OTHER): Payer: Medicare Other | Admitting: Ophthalmology

## 2021-09-09 ENCOUNTER — Other Ambulatory Visit: Payer: Self-pay

## 2021-09-09 DIAGNOSIS — H43813 Vitreous degeneration, bilateral: Secondary | ICD-10-CM

## 2021-09-09 DIAGNOSIS — H35033 Hypertensive retinopathy, bilateral: Secondary | ICD-10-CM | POA: Diagnosis not present

## 2021-09-09 DIAGNOSIS — E113391 Type 2 diabetes mellitus with moderate nonproliferative diabetic retinopathy without macular edema, right eye: Secondary | ICD-10-CM | POA: Diagnosis not present

## 2021-09-09 DIAGNOSIS — E113312 Type 2 diabetes mellitus with moderate nonproliferative diabetic retinopathy with macular edema, left eye: Secondary | ICD-10-CM

## 2021-09-09 DIAGNOSIS — I10 Essential (primary) hypertension: Secondary | ICD-10-CM

## 2021-09-09 DIAGNOSIS — H33301 Unspecified retinal break, right eye: Secondary | ICD-10-CM

## 2021-09-20 ENCOUNTER — Ambulatory Visit
Admission: RE | Admit: 2021-09-20 | Discharge: 2021-09-20 | Disposition: A | Discharge status: Home / Self Care | Payer: Medicare Other | Source: Ambulatory Visit | Attending: Family | Admitting: Family

## 2021-09-20 ENCOUNTER — Other Ambulatory Visit: Payer: Self-pay

## 2021-09-20 ENCOUNTER — Other Ambulatory Visit: Payer: Self-pay | Admitting: Family

## 2021-09-20 DIAGNOSIS — M5441 Lumbago with sciatica, right side: Secondary | ICD-10-CM

## 2021-10-07 ENCOUNTER — Encounter (INDEPENDENT_AMBULATORY_CARE_PROVIDER_SITE_OTHER): Payer: Medicare Other | Admitting: Ophthalmology

## 2021-10-08 ENCOUNTER — Encounter (INDEPENDENT_AMBULATORY_CARE_PROVIDER_SITE_OTHER): Payer: Medicare Other | Admitting: Ophthalmology

## 2021-10-08 ENCOUNTER — Other Ambulatory Visit: Payer: Self-pay

## 2021-10-08 DIAGNOSIS — H43813 Vitreous degeneration, bilateral: Secondary | ICD-10-CM

## 2021-10-08 DIAGNOSIS — E113391 Type 2 diabetes mellitus with moderate nonproliferative diabetic retinopathy without macular edema, right eye: Secondary | ICD-10-CM | POA: Diagnosis not present

## 2021-10-08 DIAGNOSIS — H33301 Unspecified retinal break, right eye: Secondary | ICD-10-CM

## 2021-10-08 DIAGNOSIS — I10 Essential (primary) hypertension: Secondary | ICD-10-CM

## 2021-10-08 DIAGNOSIS — H35033 Hypertensive retinopathy, bilateral: Secondary | ICD-10-CM

## 2021-10-08 DIAGNOSIS — E113312 Type 2 diabetes mellitus with moderate nonproliferative diabetic retinopathy with macular edema, left eye: Secondary | ICD-10-CM

## 2021-11-05 ENCOUNTER — Encounter (INDEPENDENT_AMBULATORY_CARE_PROVIDER_SITE_OTHER): Payer: Medicare Other | Admitting: Ophthalmology

## 2021-11-05 DIAGNOSIS — I10 Essential (primary) hypertension: Secondary | ICD-10-CM

## 2021-11-05 DIAGNOSIS — H33301 Unspecified retinal break, right eye: Secondary | ICD-10-CM | POA: Diagnosis not present

## 2021-11-05 DIAGNOSIS — E113312 Type 2 diabetes mellitus with moderate nonproliferative diabetic retinopathy with macular edema, left eye: Secondary | ICD-10-CM

## 2021-11-05 DIAGNOSIS — H43813 Vitreous degeneration, bilateral: Secondary | ICD-10-CM

## 2021-11-05 DIAGNOSIS — E113391 Type 2 diabetes mellitus with moderate nonproliferative diabetic retinopathy without macular edema, right eye: Secondary | ICD-10-CM | POA: Diagnosis not present

## 2021-11-05 DIAGNOSIS — H35033 Hypertensive retinopathy, bilateral: Secondary | ICD-10-CM | POA: Diagnosis not present

## 2021-11-23 ENCOUNTER — Ambulatory Visit: Payer: Medicare Other

## 2021-11-23 VITALS — BP 134/64 | HR 78 | Wt 276.1 lb

## 2021-11-23 DIAGNOSIS — I10 Essential (primary) hypertension: Secondary | ICD-10-CM | POA: Diagnosis not present

## 2021-11-23 NOTE — Progress Notes (Signed)
Patient ID: Vincent Ramos                 DOB: January 05, 1951                      MRN: [id number] ? ? ? ? ?HPI: ?Vincent Ramos is a 70 y.o. male referred by Dr. Irish Lack to HTN clinic. PMH is significant for HTN, renal duplex negative for renal artery stenosis, diastolic CHF, nonobstructive CAD, COPD and DM2. Previously could not tolerate TID hydralazine due to headaches but has been tolerating BID dosing. Experienced dizziness with clonidine 0.2 mg twice daily (taking for two years) and was discontinued in 2020.  ? ?Seen on 01/13/20 in PharmD HTN clinic and BP remained slightly elevated at 134/70. His spironolactone was increased from 12.'5mg'$  to '25mg'$  daily, however this caused his SCr to increase from 1.17 to 1.5. He was advised to hold his spironolactone for 2 days then reduce back to 12.'5mg'$  daily. At follow-up PharmD visit 02/14/20, BP was elevated at 756/43 and Bystolic was increased to '40mg'$  daily. Pt was encouraged to increase walking and limit sodium intake. BP at goal at 05/14/20 HTN visit and patient continued on Bystolic 40 mg daily, spironolactone 12.5 mg daily, hydralazine 100 mg BID, olmesartan-HCTZ 40-'25mg'$  daily, Imdur 30 mg daily and diltiazem 120 mg daily. Diltiazem was increased to 360 mg daily after admission for spinal fusion. BP at 08/20/21 clinic visit with Dr. Irish Lack was 146/70. Encouraged patient to continue efforts to lose weight. Patient was taking Bystolic 20 mg instead of prescribed 40 mg daily.  ? ?Pt presents in good spirits today. Reports tolerating medications well. Hasn't been taking spironolactone since he ran out years ago, fill history reflects this. Takes Bystolic 20 mg daily - says that the medicine was too expensive when it was titrated to 40 mg so he's stayed at 20 mg daily. Denies dizziness, falls, palpitations. Has some chest pain that can last for days; not brought on by exertion. Patient reports home BP of 329 systolic with his wrist cuff. Rests before taking measurement,  drinks coffee and takes medicines ~2 hours before reading. BP in clinic 138/68. Reports appointments with dietitian are going well - he's eating less processed foods but still eats out a few times a week and enjoys the occasional hamburger. ? ?Current HTN meds:  ?Hydralazine '100mg'$  BID (headaches on TID dosing) ?Imdur '30mg'$  daily ?Bystolic 40 mg daily (taking 20 mg at 08/20/21 OV with Dr. Irish Lack) ?Olmesartan-HCTZ 40-'25mg'$  daily ?Diltiazem '360mg'$  daily (has palpitations too) ?Spironolactone 12.5 mg daily(not taking) ? ?Previously tried:  ?Hydralazine '100mg'$  TID - headaches ?Spironolactone '25mg'$  daily - SCr increase (1.17 >> 1.5) ?Amlodipine 10 mg daily - patient-reported sexual dysfunction and feeling "jittery" ?Clonidine 0.2 mg BID - tolerated well from 2018-2019, reported arm tingling, dizziness (taking Olmesartan-HCTZ 40-'25mg'$  daily, Clonidine 0.'2mg'$  BID, Hydralazine '100mg'$  TID and Bystolic '20mg'$  daily at the time) ? ?BP goal: <130/28mHg ? ?Family History: The patient's family history includes CAD in an other family member; Cancer in an other family member; Diabetes in his sister and another family member; Hypertension in his sister and another family member; Lung cancer in his mother.  ? ?Social History: The patient reports that he has never smoked. He has never used smokeless tobacco. He reports that he does not drink alcohol or use drugs. ? ?Diet: Breakfast - peanut butter sandwich. Lunch - baked chicken. Dinner - fish, shrimp, chicken, tacos. Drinks - water, rarely drinks soda. Drinks 1  cup of coffee most days and green tea. Does not add salt to food. Uses Dash salt. ? ?Exercise: walks 15 minutes in the morning, tries to get another 15 minutes in the evening. Somewhat limited by sporadic chest pain and tightness or needing his inhaler. Goes the the [facility] 2-3 times a week - uses treadmill for 10 minutes, lifts weights  ? ?Wt Readings from Last 3 Encounters:  ?11/23/21 276 lb 1.6 oz (125.2 kg)  ?09/01/21 277 lb (125.6  kg)  ?08/20/21 274 lb (124.3 kg)  ? ?BP Readings from Last 3 Encounters:  ?11/23/21 134/64  ?08/20/21 (!) 146/70  ?09/20/20 (!) 134/91  ? ?Pulse Readings from Last 3 Encounters:  ?11/23/21 78  ?08/20/21 75  ?09/20/20 60  ? ? ?Renal function: ?CrCl cannot be calculated (Patient's most recent lab result is older than the maximum 21 days allowed.). ? ?Past Medical History:  ?Diagnosis Date  ? Abnormal nuclear cardiac imaging test 12/27/2017  ? Acute on chronic diastolic CHF (congestive heart failure) ([place]) 09/17/2017  ? Acute respiratory failure with hypoxia ([place]) 06/01/2017  ? Arthritis   ? Asthma   ? CAD (coronary artery disease)   ? a. 11/2017: cath showing widely patent coronary arteries with normal left main, 30% mid LAD, luminal irregularities in the proximal and mid circumflex, and 30 to 40% stenosis in the mid RCA.  ? CAP (community acquired pneumonia) 07/31/2014  ? Community acquired pneumonia 07/31/2014  ? Constipation 02/06/2018  ? COPD exacerbation ([address]) 09/17/2017  ? Dysphagia   ? Elevated troponin 09/17/2017  ? Enlarged prostate   ? Grade II diastolic dysfunction   ? Hypertension   ? Lumbar stenosis 10/07/2015  ? Obesity 07/31/2014  ? PNA (pneumonia) 05/30/2017  ? Pneumonia   ? Pneumonia   ? Polyp of rectum   ? Respiratory failure ([place]) 09/18/2017  ? SBO (small bowel obstruction) ([place]) 05/08/2019  ? Shortness of breath   ? Sleep apnea   ? Sleep Study 08/21/17: Severe OSA  ? Type 2 diabetes mellitus ([place])   ? ? ?Current Outpatient Medications on File Prior to Visit  ?Medication Sig Dispense Refill  ? BYSTOLIC 20 MG TABS TAKE 2 TABLETS BY MOUTH DAILY. 180 tablet 0  ? diltiazem (CARDIZEM CD) 360 MG 24 hr capsule Take 1 capsule (360 mg total) by mouth daily. 1 capsule 30  ? furosemide (LASIX) 40 MG tablet Take 40 mg by mouth daily as needed for edema.    ? gabapentin (NEURONTIN) 300 MG capsule Take 100 mg by mouth 3 (three) times daily.     ? glipiZIDE (GLUCOTROL) 10 MG tablet Take 10 mg by mouth 2 (two) times daily.     ? hydrALAZINE (APRESOLINE) 100 MG tablet Take 1 tablet (100 mg total) by mouth 3 (three) times daily. With meals. Please make overdue appt with Dr. Irish Lack before anymore refills. Thank you 1st attempt 90 tablet 0  ? isosorbide mononitrate (IMDUR) 30 MG 24 hr tablet Take 1 tablet (30 mg total) by mouth daily. 30 tablet 5  ? olmesartan-hydrochlorothiazide (BENICAR HCT) 40-25 MG tablet Take 1 tablet by mouth daily. 90 tablet 3  ? albuterol (VENTOLIN HFA) 108 (90 Base) MCG/ACT inhaler Inhale 2 puffs into the lungs every 6 (six) hours as needed for wheezing or shortness of breath.    ? aspirin EC 81 MG tablet Take 1 tablet (81 mg total) by mouth daily with breakfast. 30 tablet 4  ? azelastine (ASTELIN) 0.1 % nasal spray Place 2 sprays into  both nostrils 2 (two) times daily. Use in each nostril as directed (Patient taking differently: Place 2 sprays into both nostrils 2 (two) times daily as needed for allergies. Use in each nostril as directed) 30 mL 12  ? azelastine (OPTIVAR) 0.05 % ophthalmic solution Place 1 drop into the left eye See admin instructions. Instill 1 drop into both eyes twice daily for 2 days the day of and the day after monthly eye injections    ? Fluticasone-Umeclidin-Vilant (TRELEGY ELLIPTA) 100-62.5-25 MCG/INH AEPB Inhale 1 puff into the lungs daily.    ? Multiple Vitamin (MULTIVITAMIN WITH MINERALS) TABS tablet Take 1 tablet by mouth daily.    ? polyethylene glycol (MIRALAX / GLYCOLAX) 17 g packet Take 17 g by mouth daily. (Patient taking differently: Take 17 g by mouth daily as needed for moderate constipation.) 30 each 2  ? rosuvastatin (CRESTOR) 40 MG tablet Take 40 mg by mouth daily.    ? sitaGLIPtin (JANUVIA) 100 MG tablet Take 100 mg by mouth daily.    ? spironolactone (ALDACTONE) 25 MG tablet Take 0.5 tablets (12.5 mg total) by mouth daily. (Patient not taking: Reported on 11/23/2021) 45 tablet 3  ? tamsulosin (FLOMAX) 0.4 MG CAPS capsule Take 0.4 mg by mouth daily.    ? TRESIBA FLEXTOUCH  100 UNIT/ML FlexTouch Pen Inject 80 Units into the skin daily.    ? TRULICITY 1.5 ZJ/6.7HA SOPN Inject 1.5 mg into the skin every Sunday.    ? ?No current facility-administered medications on file prior to vis

## 2021-11-23 NOTE — Patient Instructions (Addendum)
Your blood pressure goal is less than 130/80. We will not make any changes today. We will have you follow up in one month.  ? ?Please bring your blood pressure cuff to your next visit.  ? ?Continue taking your blood pressure at home and please START keeping a log of your blood pressure numbers. Remember to rest for 5-10 minutes before getting a reading. ? ?Try to increase the intensity of your walking to a brisk pace 15-30 minutes five days a week. Continue to lift weights at the [facility].  ?

## 2021-12-06 ENCOUNTER — Encounter (INDEPENDENT_AMBULATORY_CARE_PROVIDER_SITE_OTHER): Payer: Medicare Other | Admitting: Ophthalmology

## 2021-12-09 ENCOUNTER — Encounter (INDEPENDENT_AMBULATORY_CARE_PROVIDER_SITE_OTHER): Payer: Medicare Other | Admitting: Ophthalmology

## 2021-12-09 DIAGNOSIS — I10 Essential (primary) hypertension: Secondary | ICD-10-CM

## 2021-12-09 DIAGNOSIS — H35033 Hypertensive retinopathy, bilateral: Secondary | ICD-10-CM

## 2021-12-09 DIAGNOSIS — H33301 Unspecified retinal break, right eye: Secondary | ICD-10-CM | POA: Diagnosis not present

## 2021-12-09 DIAGNOSIS — E113312 Type 2 diabetes mellitus with moderate nonproliferative diabetic retinopathy with macular edema, left eye: Secondary | ICD-10-CM | POA: Diagnosis not present

## 2021-12-09 DIAGNOSIS — H43813 Vitreous degeneration, bilateral: Secondary | ICD-10-CM | POA: Diagnosis not present

## 2021-12-09 DIAGNOSIS — E113391 Type 2 diabetes mellitus with moderate nonproliferative diabetic retinopathy without macular edema, right eye: Secondary | ICD-10-CM | POA: Diagnosis not present

## 2021-12-16 ENCOUNTER — Ambulatory Visit: Payer: Medicare Other

## 2022-01-07 ENCOUNTER — Encounter (INDEPENDENT_AMBULATORY_CARE_PROVIDER_SITE_OTHER): Payer: Medicare Other | Admitting: Ophthalmology

## 2022-03-01 ENCOUNTER — Ambulatory Visit: Payer: Self-pay | Admitting: Nutrition

## 2022-04-08 ENCOUNTER — Encounter: Payer: Self-pay | Admitting: Emergency Medicine

## 2022-04-08 ENCOUNTER — Ambulatory Visit
Admission: EM | Admit: 2022-04-08 | Discharge: 2022-04-08 | Disposition: A | Discharge status: Home / Self Care | Payer: Medicare PPO | Attending: Emergency Medicine | Admitting: Emergency Medicine

## 2022-04-08 DIAGNOSIS — L03116 Cellulitis of left lower limb: Secondary | ICD-10-CM

## 2022-04-08 DIAGNOSIS — Z8639 Personal history of other endocrine, nutritional and metabolic disease: Secondary | ICD-10-CM

## 2022-04-08 MED ORDER — CEFTRIAXONE SODIUM 1 G IJ SOLR
1.0000 g | Freq: Once | INTRAMUSCULAR | Status: AC
  Administered 2022-04-08: 1 g via INTRAMUSCULAR

## 2022-04-08 MED ORDER — CEPHALEXIN 500 MG PO CAPS: 500.0000 mg | ORAL_CAPSULE | Freq: Four times a day (QID) | ORAL | 0 refills | Status: DC

## 2022-04-08 NOTE — Discharge Instructions (Addendum)
Take Tylenol as needed for pain elevate leg is much as possible you have a bad infection in your leg and it is important to take antibiotics.  If your infection worsens despite the current treatment with antibiotic go to the ER for further evaluation you may need to be admitted for IV therapy.Marland Kitchen

## 2022-04-08 NOTE — ED Triage Notes (Signed)
Hit left lower leg on a bumper a week ago.    Area has been draining pus today and red.

## 2022-04-08 NOTE — ED Provider Notes (Signed)
[place]   MRN: [id number] DOB: [birth year]  Subjective:   Chief Complaint;  Chief Complaint  Patient presents with   Abrasion    Scratch on left leg   wound to left lower leg    Vincent Ramos is a 71 y.o. male presenting for Hit left lower leg on a bumper a week ago.    Area has been draining pus today and red.  Patient denies fever.  Patient states wound started getting red a couple of days ago.  He is able to bear weight without significant pain.  Tetanus is current per patient.   Current Facility-Administered Medications:    cefTRIAXone (ROCEPHIN) injection 1 g, 1 g, Intramuscular, Once, Hezzie Bump, NP  Current Outpatient Medications:    cephALEXin (KEFLEX) 500 MG capsule, Take 1 capsule (500 mg total) by mouth 4 (four) times daily., Disp: 20 capsule, Rfl: 0   albuterol (VENTOLIN HFA) 108 (90 Base) MCG/ACT inhaler, Inhale 2 puffs into the lungs every 6 (six) hours as needed for wheezing or shortness of breath., Disp: , Rfl:    aspirin EC 81 MG tablet, Take 1 tablet (81 mg total) by mouth daily with breakfast., Disp: 30 tablet, Rfl: 4   azelastine (ASTELIN) 0.1 % nasal spray, Place 2 sprays into both nostrils 2 (two) times daily. Use in each nostril as directed (Patient taking differently: Place 2 sprays into both nostrils 2 (two) times daily as needed for allergies. Use in each nostril as directed), Disp: 30 mL, Rfl: 12   azelastine (OPTIVAR) 0.05 % ophthalmic solution, Place 1 drop into the left eye See admin instructions. Instill 1 drop into both eyes twice daily for 2 days the day of and the day after monthly eye injections, Disp: , Rfl:    BYSTOLIC 20 MG TABS, TAKE 2 TABLETS BY MOUTH DAILY., Disp: 180 tablet, Rfl: 0   diltiazem (CARDIZEM CD) 360 MG 24 hr capsule, Take 1 capsule (360 mg total) by mouth daily., Disp: 1 capsule, Rfl: 30   Fluticasone-Umeclidin-Vilant (TRELEGY ELLIPTA) 100-62.5-25 MCG/INH AEPB, Inhale 1 puff into the lungs daily., Disp: ,  Rfl:    furosemide (LASIX) 40 MG tablet, Take 40 mg by mouth daily as needed for edema., Disp: , Rfl:    gabapentin (NEURONTIN) 300 MG capsule, Take 100 mg by mouth 3 (three) times daily. , Disp: , Rfl:    glipiZIDE (GLUCOTROL) 10 MG tablet, Take 10 mg by mouth 2 (two) times daily., Disp: , Rfl:    hydrALAZINE (APRESOLINE) 100 MG tablet, Take 1 tablet (100 mg total) by mouth 3 (three) times daily. With meals. Please make overdue appt with Dr. Irish Lack before anymore refills. Thank you 1st attempt, Disp: 90 tablet, Rfl: 0   isosorbide mononitrate (IMDUR) 30 MG 24 hr tablet, Take 1 tablet (30 mg total) by mouth daily., Disp: 30 tablet, Rfl: 5   Multiple Vitamin (MULTIVITAMIN WITH MINERALS) TABS tablet, Take 1 tablet by mouth daily., Disp: , Rfl:    olmesartan-hydrochlorothiazide (BENICAR HCT) 40-25 MG tablet, Take 1 tablet by mouth daily., Disp: 90 tablet, Rfl: 3   polyethylene glycol (MIRALAX / GLYCOLAX) 17 g packet, Take 17 g by mouth daily. (Patient taking differently: Take 17 g by mouth daily as needed for moderate constipation.), Disp: 30 each, Rfl: 2   rosuvastatin (CRESTOR) 40 MG tablet, Take 40 mg by mouth daily., Disp: , Rfl:    sitaGLIPtin (JANUVIA) 100 MG tablet, Take 100 mg by mouth daily., Disp: ,  Rfl:    spironolactone (ALDACTONE) 25 MG tablet, Take 0.5 tablets (12.5 mg total) by mouth daily. (Patient not taking: Reported on 11/23/2021), Disp: 45 tablet, Rfl: 3   tamsulosin (FLOMAX) 0.4 MG CAPS capsule, Take 0.4 mg by mouth daily., Disp: , Rfl:    TRESIBA FLEXTOUCH 100 UNIT/ML FlexTouch Pen, Inject 80 Units into the skin daily., Disp: , Rfl:    TRULICITY 1.5 EX/5.1ZG SOPN, Inject 1.5 mg into the skin every Sunday., Disp: , Rfl:    Allergies  Allergen Reactions   Amlodipine     Felt "jittery" constantly   Novolog [Insulin Aspart] Hives, Itching and Rash   Victoza [Liraglutide] Itching and Rash    Past Medical History:  Diagnosis Date   Abnormal nuclear cardiac imaging test  12/27/2017   Acute on chronic diastolic CHF (congestive heart failure) ([place]) 09/17/2017   Acute respiratory failure with hypoxia ([place]) 06/01/2017   Arthritis    Asthma    CAD (coronary artery disease)    a. 11/2017: cath showing widely patent coronary arteries with normal left main, 30% mid LAD, luminal irregularities in the proximal and mid circumflex, and 30 to 40% stenosis in the mid RCA.   CAP (community acquired pneumonia) 07/31/2014   Community acquired pneumonia 07/31/2014   Constipation 02/06/2018   COPD exacerbation ([place]) 09/17/2017   Dysphagia    Elevated troponin 09/17/2017   Enlarged prostate    Grade II diastolic dysfunction    Hypertension    Lumbar stenosis 10/07/2015   Obesity 07/31/2014   PNA (pneumonia) 05/30/2017   Pneumonia    Pneumonia    Polyp of rectum    Respiratory failure ([place]) 09/18/2017   SBO (small bowel obstruction) ([place]) 05/08/2019   Shortness of breath    Sleep apnea    Sleep Study 08/21/17: Severe OSA   Type 2 diabetes mellitus ([place])      Review of Systems  All other systems reviewed and are negative.    Objective:   Vitals: BP (!) 173/90 (BP Location: Right Arm)   Pulse 78   Temp 99.1 F (37.3 C) (Oral)   Resp 18   SpO2 92%   Physical Exam Vitals and nursing note reviewed.  Constitutional:      General: He is not in acute distress.    Appearance: Normal appearance. He is normal weight. He is not ill-appearing or toxic-appearing.  HENT:     Head: Normocephalic and atraumatic.     Right Ear: External ear normal.     Left Ear: External ear normal.  Eyes:     Conjunctiva/sclera: Conjunctivae normal.  Pulmonary:     Effort: Pulmonary effort is normal.  Skin:    Findings: Erythema and lesion present.     Comments: Superficial abrasion anterior left lower leg draining yellow pus with surrounding erythema in 20 x 15 region above the ankle and below the knee.  No neurovascular deficit distally, non circumferential  Neurological:     Mental  Status: He is alert.  Psychiatric:        Mood and Affect: Mood normal.        Behavior: Behavior normal.     No results found for this or any previous visit (from the past 24 hour(s)).  No results found.     Assessment and Plan :   1. Cellulitis of leg, left   2. Hx of diabetes mellitus     Meds ordered this encounter  Medications   cefTRIAXone (ROCEPHIN) injection 1 g  cephALEXin (KEFLEX) 500 MG capsule    Sig: Take 1 capsule (500 mg total) by mouth 4 (four) times daily.    Dispense:  20 capsule    Refill:  0    Order Specific Question:   Supervising Provider    Answer:   Chase Picket [[id number]]    MDM:  Vincent Ramos is a 71 y.o. male with diabetes and obesity presenting for infected wound on left lower leg.  On exam the left lower leg there is a large superficial abrasion anterior left lower leg draining yellow pus with surrounding erythema in 20 x 15 region above the ankle and below the knee.  No neurovascular deficit distally, non circumferential pattern of spreading erythema.  History and physical are consistent with a left lower leg cellulitis.  I ordered 1 g of Rocephin and Rx for home with Keflex.  He is encouraged to take Tylenol for pain and elevate the extremity is much as possible.  He is also instructed to go to ER if worse at any time for further evaluation possible admission for IV antibiotics.  I discussed todays findings, treatment plan, follow up and return instructions. Questions were answered. Patient/representative stated understanding of the instructions and patient is stable for discharge.  Leida Lauth FNP-C MSN    Hezzie Bump, NP 04/08/22 [phone]

## 2022-04-11 ENCOUNTER — Ambulatory Visit
Admission: EM | Admit: 2022-04-11 | Discharge: 2022-04-11 | Disposition: A | Discharge status: Home / Self Care | Payer: Medicare PPO

## 2022-04-11 DIAGNOSIS — Z5189 Encounter for other specified aftercare: Secondary | ICD-10-CM

## 2022-04-11 NOTE — ED Triage Notes (Signed)
Pt reports pain and swelling in left lower leg is improving, drainage stopped. Pt was prescribed cephalexin on 04/08/2022. Pt has an follow yp with PCP end of September.

## 2022-04-11 NOTE — ED Provider Notes (Signed)
[facility]    CSN: [id number] Arrival date & time: 04/11/22  1134      History   Chief Complaint Chief Complaint  Patient presents with   Leg Pain    HPI [place] Cyphers is a 71 y.o. male.   Patient presents today for wound check.  He was seen 3 days ago in urgent care and started on Keflex 500 mg 4 times daily for cellulitis of the left lower extremity.  Reports he thinks the wound has improved.  Reports he is taking antibiotics as prescribed.  Denies any new drainage from the wound, reports it is now scabbed over, pain has improved as well.  No fevers, bodies, or chills.  No nausea/vomiting, abdominal pain, diarrhea.  Reports there is still some redness, pain, and swelling especially when he leaves his leg elevated for long period of time.    Past Medical History:  Diagnosis Date   Abnormal nuclear cardiac imaging test 12/27/2017   Acute on chronic diastolic CHF (congestive heart failure) ([place]) 09/17/2017   Acute respiratory failure with hypoxia ([facility]) 06/01/2017   Arthritis    Asthma    CAD (coronary artery disease)    a. 11/2017: cath showing widely patent coronary arteries with normal left main, 30% mid LAD, luminal irregularities in the proximal and mid circumflex, and 30 to 40% stenosis in the mid RCA.   CAP (community acquired pneumonia) 07/31/2014   Community acquired pneumonia 07/31/2014   Constipation 02/06/2018   COPD exacerbation ([place]) 09/17/2017   Dysphagia    Elevated troponin 09/17/2017   Enlarged prostate    Grade II diastolic dysfunction    Hypertension    Lumbar stenosis 10/07/2015   Obesity 07/31/2014   PNA (pneumonia) 05/30/2017   Pneumonia    Pneumonia    Polyp of rectum    Respiratory failure ([place]) 09/18/2017   SBO (small bowel obstruction) ([place]) 05/08/2019   Shortness of breath    Sleep apnea    Sleep Study 08/21/17: Severe OSA   Type 2 diabetes mellitus [facility])     Patient Active Problem List   Diagnosis Date Noted   Stenosis of  cervical spine with myelopathy ([place]) 09/17/2020   Cervical stenosis of spine 09/17/2020   SBO (small bowel obstruction) ([place]) 05/08/2019   Constipation 02/06/2018   Abnormal nuclear cardiac imaging test 12/27/2017   Respiratory failure ([place]) 09/18/2017   COPD exacerbation ([place]) 09/17/2017   Elevated troponin 09/17/2017   Grade II diastolic dysfunction 74/25/9563   Acute on chronic diastolic CHF (congestive heart failure) ([place]) 09/17/2017   Polyp of rectum    Encounter for screening colonoscopy 07/09/2017   Aspiration pneumonia ([place]) 06/06/2017   Dysphagia    Acute respiratory failure with hypoxia ([place]) 06/01/2017   PNA (pneumonia) 05/30/2017   Lumbar stenosis 10/07/2015   Type 2 diabetes mellitus ([place]) 07/31/2014   Hypertension 07/31/2014   Obesity 07/31/2014   Community acquired pneumonia 07/31/2014    Past Surgical History:  Procedure Laterality Date   ANTERIOR CERVICAL DECOMP/DISCECTOMY FUSION N/A 09/17/2020   Procedure: Anterior Cevrvical Decompresion / Discectomy Fusion Cervical four-five,Cervical five-six,Cervical six- seven;  Surgeon: Vallarie Mare, MD;  Location: [place];  Service: Neurosurgery;  Laterality: N/A;   CIRCUMCISION     COLONOSCOPY WITH PROPOFOL N/A 08/08/2017   Procedure: COLONOSCOPY WITH PROPOFOL;  Surgeon: Danie Binder, MD;  [facility];  Service: Endoscopy;  Laterality: N/A;  1:00pm   ESOPHAGEAL DILATION N/A 06/07/2017   Procedure: ESOPHAGEAL DILATION;  Surgeon: Daneil Dolin, MD;  [facility];  Service: Endoscopy;  Laterality: N/A;   ESOPHAGOGASTRODUODENOSCOPY (EGD) WITH PROPOFOL N/A 06/07/2017   Procedure: ESOPHAGOGASTRODUODENOSCOPY (EGD) WITH PROPOFOL;  Surgeon: Daneil Dolin, MD;  [facility];  Service: Endoscopy;  Laterality: N/A;   HYDROCELE EXCISION  10/14/2011   Procedure: HYDROCELECTOMY ADULT;  Surgeon: Malka So, MD;  Location: [facility];  Service: Urology;  Laterality: Right;   LACERATION REPAIR     left  hand   LUMBAR LAMINECTOMY/DECOMPRESSION MICRODISCECTOMY Right 10/07/2015   Procedure: Right Lumbar four-five ,lumbar five sacral-one Laminectomy;  Surgeon: Leeroy Cha, MD;  Location: [facility];  Service: Neurosurgery;  Laterality: Right;   RIGHT/LEFT HEART CATH AND CORONARY ANGIOGRAPHY N/A 12/27/2017   Procedure: RIGHT/LEFT HEART CATH AND CORONARY ANGIOGRAPHY;  Surgeon: Belva Crome, MD;  Location: [facility];  Service: Cardiovascular;  Laterality: N/A;       Home Medications    Prior to Admission medications   Medication Sig Start Date End Date Taking? Authorizing Provider  albuterol (VENTOLIN HFA) 108 (90 Base) MCG/ACT inhaler Inhale 2 puffs into the lungs every 6 (six) hours as needed for wheezing or shortness of breath.    [provider]  aspirin EC 81 MG tablet Take 1 tablet (81 mg total) by mouth daily with breakfast. 05/12/19   Emokpae, Courage, MD  azelastine (ASTELIN) 0.1 % nasal spray Place 2 sprays into both nostrils 2 (two) times daily. Use in each nostril as directed Patient taking differently: Place 2 sprays into both nostrils 2 (two) times daily as needed for allergies. Use in each nostril as directed 10/15/19   Noemi Chapel P, DO  azelastine (OPTIVAR) 0.05 % ophthalmic solution Place 1 drop into the left eye See admin instructions. Instill 1 drop into both eyes twice daily for 2 days the day of and the day after monthly eye injections 12/09/19   [provider]  BYSTOLIC 20 MG TABS TAKE 2 TABLETS BY MOUTH DAILY. 02/23/21   Richardson Dopp T, PA-C  cephALEXin (KEFLEX) 500 MG capsule Take 1 capsule (500 mg total) by mouth 4 (four) times daily. 04/08/22   Hezzie Bump, NP  diltiazem (CARDIZEM CD) 360 MG 24 hr capsule Take 1 capsule (360 mg total) by mouth daily. 09/20/20   Kipp Brood, MD  Fluticasone-Umeclidin-Vilant (TRELEGY ELLIPTA) 100-62.5-25 MCG/INH AEPB Inhale 1 puff into the lungs daily.    [provider]  furosemide (LASIX) 40 MG  tablet Take 40 mg by mouth daily as needed for edema.    [provider]  gabapentin (NEURONTIN) 300 MG capsule Take 100 mg by mouth 3 (three) times daily.  09/25/19   [provider]  glipiZIDE (GLUCOTROL) 10 MG tablet Take 10 mg by mouth 2 (two) times daily. 07/04/20   [provider]  hydrALAZINE (APRESOLINE) 100 MG tablet Take 1 tablet (100 mg total) by mouth 3 (three) times daily. With meals. Please make overdue appt with Dr. Irish Lack before anymore refills. Thank you 1st attempt 01/04/21   Jettie Booze, MD  isosorbide mononitrate (IMDUR) 30 MG 24 hr tablet Take 1 tablet (30 mg total) by mouth daily. 05/13/19   Roxan Hockey, MD  Multiple Vitamin (MULTIVITAMIN WITH MINERALS) TABS tablet Take 1 tablet by mouth daily.    [provider]  olmesartan-hydrochlorothiazide (BENICAR HCT) 40-25 MG tablet Take 1 tablet by mouth daily. 11/25/19   Jettie Booze, MD  polyethylene glycol [facility] / Floria Raveling) 17  g packet Take 17 g by mouth daily. Patient taking differently: Take 17 g by mouth daily as needed for moderate constipation. 05/12/19   Roxan Hockey, MD  rosuvastatin (CRESTOR) 40 MG tablet Take 40 mg by mouth daily.    [provider]  sitaGLIPtin (JANUVIA) 100 MG tablet Take 100 mg by mouth daily.    [provider]  spironolactone (ALDACTONE) 25 MG tablet Take 0.5 tablets (12.5 mg total) by mouth daily. Patient not taking: Reported on 11/23/2021 02/14/20   Jettie Booze, MD  tamsulosin (FLOMAX) 0.4 MG CAPS capsule Take 0.4 mg by mouth daily. 09/04/19   [provider]  TRESIBA FLEXTOUCH 100 UNIT/ML FlexTouch Pen Inject 80 Units into the skin daily. 08/11/20   [provider]  TRULICITY 1.5 UU/7.2ZD SOPN Inject 1.5 mg into the skin every Sunday. 04/12/17   [provider]    Family History Family History  Problem Relation Age of Onset   Diabetes Other    Hypertension Other    CAD Other    Cancer  Other    Lung cancer Mother        smoker   Diabetes Sister    Hypertension Sister    Anesthesia problems Neg Hx    Hypotension Neg Hx    Malignant hyperthermia Neg Hx    Pseudochol deficiency Neg Hx    Colon cancer Neg Hx    Colon polyps Neg Hx     Social History Social History   Tobacco Use   Smoking status: Never   Smokeless tobacco: Never  Vaping Use   Vaping Use: Never used  Substance Use Topics   Alcohol use: No    Alcohol/week: 0.0 standard drinks of alcohol    Comment: rare beer   Drug use: No     Allergies   Amlodipine, Novolog [insulin aspart], and Victoza [liraglutide]   Review of Systems Review of Systems Per HPI  Physical Exam Triage Vital Signs ED Triage Vitals  Enc Vitals Group     BP 04/11/22 1241 (!) 155/79     Pulse Rate 04/11/22 1241 74     Resp 04/11/22 1241 20     Temp 04/11/22 1241 98.1 F (36.7 C)     Temp Source 04/11/22 1241 Oral     SpO2 04/11/22 1241 93 %     Weight --      Height --      Head Circumference --      Peak Flow --      Pain Score 04/11/22 1235 0     Pain Loc --      Pain Edu? --      Excl. in [place]? --    No data found.  Updated Vital Signs BP (!) 155/79 (BP Location: Right Arm)   Pulse 74   Temp 98.1 F (36.7 C) (Oral)   Resp 20   SpO2 93%   Visual Acuity Right Eye Distance:   Left Eye Distance:   Bilateral Distance:    Right Eye Near:   Left Eye Near:    Bilateral Near:     Physical Exam Vitals and nursing note reviewed.  Constitutional:      General: He is not in acute distress.    Appearance: Normal appearance. He is obese. He is not toxic-appearing.  Pulmonary:     Effort: Pulmonary effort is normal. No respiratory distress.  Musculoskeletal:     Right lower leg: Edema present.     Left lower  leg: Edema present.  Skin:    General: Skin is warm and dry.     Findings: Wound present.          Comments: Superficial wound to anterior left lower extremity in approximately area marked.  Mild  erythema surrounding wound edges.  Edges appear approximately with scabbing.  No active drainage.  No neurovascular deficit distally.  Neurological:     Mental Status: He is alert and oriented to person, place, and time.  Psychiatric:        Behavior: Behavior is cooperative.      UC Treatments / Results  Labs (all labs ordered are listed, but only abnormal results are displayed) Labs Reviewed - No data to display  EKG   Radiology No results found.  Procedures Procedures (including critical care time)  Medications Ordered in UC Medications - No data to display  Initial Impression / Assessment and Plan / UC Course  I have reviewed the triage vital signs and the nursing notes.  Pertinent labs & imaging results that were available during my care of the patient were reviewed by me and considered in my medical decision making (see chart for details).    Patient is a very pleasant, well-appearing 71 year old male presenting for wound check.  He is treated for cellulitis 3 days ago.  Cellulitis appears to be improving on examination today.  Recommended continuing current regimen as prescribed and finishing entire course.  Discussed wound care.  Discussed return and ER precautions.  The patient was given the opportunity to ask questions.  All questions answered to their satisfaction.  The patient is in agreement to this plan.   Final Clinical Impressions(s) / UC Diagnoses   Final diagnoses:  Visit for wound check   Discharge Instructions   None    ED Prescriptions   None    PDMP not reviewed this encounter.   Eulogio Bear, NP 04/11/22 1313

## 2022-08-29 NOTE — Progress Notes (Deleted)
Office Visit    Patient Name: Vincent Ramos Date of Encounter: 08/29/2022  Primary Care Provider:  Sonia Side., FNP Primary Cardiologist:  Larae Grooms, MD Primary Electrophysiologist: None  Chief Complaint    Vincent Ramos is a 72 y.o. male with PMH of diastolic CHF, HTN, COPD, HLD, DM type II, morbid obesity, nonobstructive CAD s/p LHC who presents today for 42-monthfollow-up of CAD and HTN.  Past Medical History    Past Medical History:  Diagnosis Date   Abnormal nuclear cardiac imaging test 12/27/2017   Acute on chronic diastolic CHF (congestive heart failure) [place] 09/17/[place]   Acute respiratory failure with hypoxia (HCC) 06/01/2017   Arthritis    Asthma    CAD (coronary artery disease)    a. 11/2017: cath showing widely patent coronary arteries with normal left main, 30% mid LAD, luminal irregularities in the proximal and mid circumflex, and 30 to 40% stenosis in the mid RCA.   CAP (community acquired pneumonia) 07/31/2014   Community acquired pneumonia 07/31/2014   Constipation 02/06/2018   COPD exacerbation ([place] 09/17/2017   Dysphagia    Elevated troponin 09/17/2017   Enlarged prostate    Grade II diastolic dysfunction    Hypertension    Lumbar stenosis 10/07/2015   Obesity 07/31/2014   PNA (pneumonia) 05/30/2017   Pneumonia    Pneumonia    Polyp of rectum    Respiratory failure ([place] 09/18/2017   SBO (small bowel obstruction) ([place] 05/08/2019   Shortness of breath    Sleep apnea    Sleep Study 08/21/17: Severe OSA   Type 2 diabetes mellitus ([place]    Past Surgical History:  Procedure Laterality Date   ANTERIOR CERVICAL DECOMP/DISCECTOMY FUSION N/A 09/17/2020   Procedure: Anterior Cevrvical Decompresion / Discectomy Fusion Cervical four-five,Cervical five-six,Cervical six- seven;  Surgeon: TVallarie Mare MD;  [place]  Service: Neurosurgery;  Laterality: N/A;   CIRCUMCISION     COLONOSCOPY WITH PROPOFOL N/A 08/08/2017   Procedure:  COLONOSCOPY WITH PROPOFOL;  Surgeon: FDanie Binder MD;  [facility];  Service: Endoscopy;  Laterality: N/A;  1:00pm   ESOPHAGEAL DILATION N/A 06/07/2017   Procedure: ESOPHAGEAL DILATION;  Surgeon: RDaneil Dolin MD;  [facility];  Service: Endoscopy;  Laterality: N/A;   ESOPHAGOGASTRODUODENOSCOPY (EGD) WITH PROPOFOL N/A 06/07/2017   Procedure: ESOPHAGOGASTRODUODENOSCOPY (EGD) WITH PROPOFOL;  Surgeon: RDaneil Dolin MD;  [facility];  Service: Endoscopy;  Laterality: N/A;   HYDROCELE EXCISION  10/14/2011   Procedure: HYDROCELECTOMY ADULT;  Surgeon: JMalka So MD;  [facility];  Service: Urology;  Laterality: Right;   LACERATION REPAIR     left hand   LUMBAR LAMINECTOMY/DECOMPRESSION MICRODISCECTOMY Right 10/07/2015   Procedure: Right Lumbar four-five ,lumbar five sacral-one Laminectomy;  Surgeon: ELeeroy Cha MD;  [facility];  Service: Neurosurgery;  Laterality: Right;   RIGHT/LEFT HEART CATH AND CORONARY ANGIOGRAPHY N/A 12/27/2017   Procedure: RIGHT/LEFT HEART CATH AND CORONARY ANGIOGRAPHY;  Surgeon: SBelva Crome MD;  [facility];  Service: Cardiovascular;  Laterality: N/A;    Allergies  Allergies  Allergen Reactions   Amlodipine     Felt "jittery" constantly   Novolog [Insulin Aspart] Hives, Itching and Rash   Victoza [Liraglutide] Itching and Rash    History of Present Illness    RTraylin Ramos  is a 72year old male with the above mention past medical history who presents today  for 1 year follow-up of CAD.  Vincent Ramos was recently seen by Dr. Algis Downs for hypertension.  He had 2D echo completed 09/2017 that showed EF of 60 to 65% and Myoview completed 4/19 with an inferior defect and partial reversible EF of 45%.  He underwent LHC by Dr. Tamala Julian that showed nonobstructive CAD in 5/2019d widely patent coronary arteries with normal left main, 30% mid LAD, luminal irregularities in the proximal and mid  circumflex, and 30 to 40% stenosis in the mid RCA. EF was estimated at 40 to 45% and he was noted to have severely elevated end-diastolic pressure consistent with chronic combined systolic and diastolic heart failure.  Patient was noted to have severe hypertension during procedure and BP medications were titrated closely.  Renal duplex was completed showing no evidence of renal artery stenosis.  Patient has issues with compliance and self adjusting medications.  He stopped his statins due to myalgias he was seen by Dr. Irish Lack in 10/2019 and patient reported some rare chest tightness with increased heart rate due to ambulation and palpitations.  He was started on Cardizem to help with BP and palpitations.  He was also referred to HTN clinic to see Pharm.D.  He also had lower extremity swelling with plan to switch to furosemide if volume continued to increase.  He was last seen by Dr. Irish Lack on 08/20/2021 blood pressures were reasonably controlled with follow-ups with Pharm.D.      Since last being seen in the office patient reports***.  Patient denies chest pain, palpitations, dyspnea, PND, orthopnea, nausea, vomiting, dizziness, syncope, edema, weight gain, or early satiety.     ***Notes:  Home Medications    Current Outpatient Medications  Medication Sig Dispense Refill   albuterol (VENTOLIN HFA) 108 (90 Base) MCG/ACT inhaler Inhale 2 puffs into the lungs every 6 (six) hours as needed for wheezing or shortness of breath.     aspirin EC 81 MG tablet Take 1 tablet (81 mg total) by mouth daily with breakfast. 30 tablet 4   azelastine (ASTELIN) 0.1 % nasal spray Place 2 sprays into both nostrils 2 (two) times daily. Use in each nostril as directed (Patient taking differently: Place 2 sprays into both nostrils 2 (two) times daily as needed for allergies. Use in each nostril as directed) 30 mL 12   azelastine (OPTIVAR) 0.05 % ophthalmic solution Place 1 drop into the left eye See admin instructions.  Instill 1 drop into both eyes twice daily for 2 days the day of and the day after monthly eye injections     BYSTOLIC 20 MG TABS TAKE 2 TABLETS BY MOUTH DAILY. 180 tablet 0   cephALEXin (KEFLEX) 500 MG capsule Take 1 capsule (500 mg total) by mouth 4 (four) times daily. 20 capsule 0   diltiazem (CARDIZEM CD) 360 MG 24 hr capsule Take 1 capsule (360 mg total) by mouth daily. 1 capsule 30   Fluticasone-Umeclidin-Vilant (TRELEGY ELLIPTA) 100-62.5-25 MCG/INH AEPB Inhale 1 puff into the lungs daily.     furosemide (LASIX) 40 MG tablet Take 40 mg by mouth daily as needed for edema.     gabapentin (NEURONTIN) 300 MG capsule Take 100 mg by mouth 3 (three) times daily.      glipiZIDE (GLUCOTROL) 10 MG tablet Take 10 mg by mouth 2 (two) times daily.     hydrALAZINE (APRESOLINE) 100 MG tablet Take 1 tablet (100 mg total) by mouth 3 (three) times daily. With meals. Please make overdue appt with Dr. Irish Lack before anymore  refills. Thank you 1st attempt 90 tablet 0   isosorbide mononitrate (IMDUR) 30 MG 24 hr tablet Take 1 tablet (30 mg total) by mouth daily. 30 tablet 5   Multiple Vitamin (MULTIVITAMIN WITH MINERALS) TABS tablet Take 1 tablet by mouth daily.     olmesartan-hydrochlorothiazide (BENICAR HCT) 40-25 MG tablet Take 1 tablet by mouth daily. 90 tablet 3   polyethylene glycol (MIRALAX / GLYCOLAX) 17 g packet Take 17 g by mouth daily. (Patient taking differently: Take 17 g by mouth daily as needed for moderate constipation.) 30 each 2   rosuvastatin (CRESTOR) 40 MG tablet Take 40 mg by mouth daily.     sitaGLIPtin (JANUVIA) 100 MG tablet Take 100 mg by mouth daily.     spironolactone (ALDACTONE) 25 MG tablet Take 0.5 tablets (12.5 mg total) by mouth daily. (Patient not taking: Reported on 11/23/2021) 45 tablet 3   tamsulosin (FLOMAX) 0.4 MG CAPS capsule Take 0.4 mg by mouth daily.     TRESIBA FLEXTOUCH 100 UNIT/ML FlexTouch Pen Inject 80 Units into the skin daily.     TRULICITY 1.5 0000000 SOPN Inject  1.5 mg into the skin every Sunday.     No current facility-administered medications for this visit.     Review of Systems  Please see the history of present illness.    (+)*** (+)***  All other systems reviewed and are otherwise negative except as noted above.  Physical Exam    Wt Readings from Last 3 Encounters:  11/23/21 276 lb 1.6 oz (125.2 kg)  09/01/21 277 lb (125.6 kg)  08/20/21 274 lb (124.3 kg)   [id number] were no vitals filed for this visit.,There is no height or weight on file to calculate BMI.  Constitutional:      Appearance: Healthy appearance. Not in distress.  Neck:     Vascular: JVD normal.  Pulmonary:     Effort: Pulmonary effort is normal.     Breath sounds: No wheezing. No rales. Diminished in the bases Cardiovascular:     Normal rate. Regular rhythm. Normal S1. Normal S2.      Murmurs: There is no murmur.  Edema:    Peripheral edema absent.  Abdominal:     Palpations: Abdomen is soft non tender. There is no hepatomegaly.  Skin:    General: Skin is warm and dry.  Neurological:     General: No focal deficit present.     Mental Status: Alert and oriented to person, place and time.     Cranial Nerves: Cranial nerves are intact.  EKG/LABS/Other Studies Reviewed    ECG personally reviewed by me today - ***  Risk Assessment/Calculations:   {Does this patient have ATRIAL FIBRILLATION?:[id number]}        Lab Results  Component Value Date   WBC 9.8 09/17/2020   HGB 15.3 09/19/2020   HCT 45.0 09/19/2020   MCV 85.6 09/17/2020   PLT 121 (L) 09/17/2020   Lab Results  Component Value Date   CREATININE 1.38 (H) 09/17/2020   BUN 16 09/17/2020   NA 135 09/19/2020   K 3.6 09/19/2020   CL 105 09/17/2020   CO2 27 09/17/2020   Lab Results  Component Value Date   ALT 19 05/08/2019   AST 16 05/08/2019   ALKPHOS 70 05/08/2019   BILITOT 1.2 05/08/2019   Lab Results  Component Value Date   CHOL 193 01/04/2013   HDL 45 01/04/2013   LDLCALC 122 (H)  01/04/2013   TRIG 129 01/04/2013  CHOLHDL 4.3 01/04/2013    Lab Results  Component Value Date   HGBA1C 7.7 (H) 09/15/2020    Assessment & Plan    1.  Chronic diastolic CHF:  2.  Coronary artery disease:  3.  Essential hypertension:  4.  DM type II:  5.  Morbid obesity:      Disposition: Follow-up with Larae Grooms, MD or APP in *** months {Are you ordering a CV Procedure (e.g. stress test, cath, DCCV, TEE, etc)?   Press F2        :[id number]   Medication Adjustments/Labs and Tests Ordered: Current medicines are reviewed at length with the patient today.  Concerns regarding medicines are outlined above.   Signed, Mable Fill, Marissa Nestle, NP 08/29/2022, 6:42 PM [facility]  Note:  This document was prepared using Dragon voice recognition software and may include unintentional dictation errors.

## 2022-08-31 ENCOUNTER — Ambulatory Visit: Payer: Medicare PPO | Admitting: Nurse Practitioner

## 2022-08-31 DIAGNOSIS — E785 Hyperlipidemia, unspecified: Secondary | ICD-10-CM

## 2022-08-31 DIAGNOSIS — I2510 Atherosclerotic heart disease of native coronary artery without angina pectoris: Secondary | ICD-10-CM

## 2022-08-31 DIAGNOSIS — I5032 Chronic diastolic (congestive) heart failure: Secondary | ICD-10-CM

## 2022-08-31 DIAGNOSIS — E119 Type 2 diabetes mellitus without complications: Secondary | ICD-10-CM

## 2022-08-31 DIAGNOSIS — I10 Essential (primary) hypertension: Secondary | ICD-10-CM

## 2022-09-19 ENCOUNTER — Other Ambulatory Visit: Payer: Self-pay

## 2022-09-19 ENCOUNTER — Encounter (HOSPITAL_COMMUNITY): Payer: Self-pay | Admitting: Emergency Medicine

## 2022-09-19 ENCOUNTER — Emergency Department (HOSPITAL_COMMUNITY): Payer: Medicare Other

## 2022-09-19 ENCOUNTER — Inpatient Hospital Stay (HOSPITAL_COMMUNITY)
Admission: EM | Admit: 2022-09-19 | Discharge: 2022-09-23 | DRG: 189 | Disposition: A | Discharge status: Home / Self Care | Payer: Medicare Other | Attending: Internal Medicine | Admitting: Internal Medicine

## 2022-09-19 DIAGNOSIS — Z86010 Personal history of colonic polyps: Secondary | ICD-10-CM | POA: Diagnosis not present

## 2022-09-19 DIAGNOSIS — I5031 Acute diastolic (congestive) heart failure: Secondary | ICD-10-CM | POA: Diagnosis not present

## 2022-09-19 DIAGNOSIS — E876 Hypokalemia: Secondary | ICD-10-CM | POA: Diagnosis present

## 2022-09-19 DIAGNOSIS — E119 Type 2 diabetes mellitus without complications: Secondary | ICD-10-CM

## 2022-09-19 DIAGNOSIS — Z1152 Encounter for screening for COVID-19: Secondary | ICD-10-CM | POA: Diagnosis not present

## 2022-09-19 DIAGNOSIS — Z981 Arthrodesis status: Secondary | ICD-10-CM | POA: Diagnosis not present

## 2022-09-19 DIAGNOSIS — I110 Hypertensive heart disease with heart failure: Secondary | ICD-10-CM | POA: Diagnosis present

## 2022-09-19 DIAGNOSIS — Z7982 Long term (current) use of aspirin: Secondary | ICD-10-CM

## 2022-09-19 DIAGNOSIS — Z79899 Other long term (current) drug therapy: Secondary | ICD-10-CM | POA: Diagnosis not present

## 2022-09-19 DIAGNOSIS — E785 Hyperlipidemia, unspecified: Secondary | ICD-10-CM | POA: Diagnosis present

## 2022-09-19 DIAGNOSIS — Z888 Allergy status to other drugs, medicaments and biological substances status: Secondary | ICD-10-CM

## 2022-09-19 DIAGNOSIS — Z833 Family history of diabetes mellitus: Secondary | ICD-10-CM | POA: Diagnosis not present

## 2022-09-19 DIAGNOSIS — Z6841 Body Mass Index (BMI) 40.0 and over, adult: Secondary | ICD-10-CM | POA: Diagnosis not present

## 2022-09-19 DIAGNOSIS — Z7984 Long term (current) use of oral hypoglycemic drugs: Secondary | ICD-10-CM | POA: Diagnosis not present

## 2022-09-19 DIAGNOSIS — Z8249 Family history of ischemic heart disease and other diseases of the circulatory system: Secondary | ICD-10-CM | POA: Diagnosis not present

## 2022-09-19 DIAGNOSIS — I2510 Atherosclerotic heart disease of native coronary artery without angina pectoris: Secondary | ICD-10-CM | POA: Diagnosis present

## 2022-09-19 DIAGNOSIS — E1165 Type 2 diabetes mellitus with hyperglycemia: Secondary | ICD-10-CM | POA: Diagnosis present

## 2022-09-19 DIAGNOSIS — J9601 Acute respiratory failure with hypoxia: Principal | ICD-10-CM | POA: Diagnosis present

## 2022-09-19 DIAGNOSIS — I5033 Acute on chronic diastolic (congestive) heart failure: Secondary | ICD-10-CM | POA: Diagnosis present

## 2022-09-19 DIAGNOSIS — M48061 Spinal stenosis, lumbar region without neurogenic claudication: Secondary | ICD-10-CM | POA: Diagnosis present

## 2022-09-19 DIAGNOSIS — E669 Obesity, unspecified: Secondary | ICD-10-CM | POA: Diagnosis present

## 2022-09-19 DIAGNOSIS — I10 Essential (primary) hypertension: Secondary | ICD-10-CM | POA: Diagnosis present

## 2022-09-19 DIAGNOSIS — J441 Chronic obstructive pulmonary disease with (acute) exacerbation: Secondary | ICD-10-CM | POA: Diagnosis present

## 2022-09-19 DIAGNOSIS — N400 Enlarged prostate without lower urinary tract symptoms: Secondary | ICD-10-CM | POA: Diagnosis present

## 2022-09-19 LAB — BLOOD GAS, VENOUS
Acid-Base Excess: 8.8 mmol/L — ABNORMAL HIGH (ref 0.0–2.0)
Bicarbonate: 36.3 mmol/L — ABNORMAL HIGH (ref 20.0–28.0)
Drawn by: 38962
FIO2: 21 %
O2 Saturation: 50.4 %
Patient temperature: 36.5
pCO2, Ven: 59 mmHg (ref 44–60)
pH, Ven: 7.4 (ref 7.25–7.43)
pO2, Ven: 32 mmHg (ref 32–45)

## 2022-09-19 LAB — CBC
HCT: 48.3 % (ref 39.0–52.0)
HCT: 45.4 % (ref 39.0–52.0)
Hemoglobin: 15.2 g/dL (ref 13.0–17.0)
Hemoglobin: 14.5 g/dL (ref 13.0–17.0)
MCH: 26.8 pg (ref 26.0–34.0)
MCH: 27.1 pg (ref 26.0–34.0)
MCHC: 31.5 g/dL (ref 30.0–36.0)
MCHC: 31.9 g/dL (ref 30.0–36.0)
MCV: 85.2 fL (ref 80.0–100.0)
MCV: 84.9 fL (ref 80.0–100.0)
Platelets: 170 10*3/uL (ref 150–400)
Platelets: 161 10*3/uL (ref 150–400)
RBC: 5.67 MIL/uL (ref 4.22–5.81)
RBC: 5.35 MIL/uL (ref 4.22–5.81)
RDW: 15.3 % (ref 11.5–15.5)
RDW: 14.9 % (ref 11.5–15.5)
WBC: 9.8 10*3/uL (ref 4.0–10.5)
WBC: 9.6 10*3/uL (ref 4.0–10.5)
nRBC: 0 % (ref 0.0–0.2)
nRBC: 0 % (ref 0.0–0.2)

## 2022-09-19 LAB — COMPREHENSIVE METABOLIC PANEL
ALT: 24 U/L (ref 0–44)
AST: 21 U/L (ref 15–41)
Albumin: 4 g/dL (ref 3.5–5.0)
Alkaline Phosphatase: 84 U/L (ref 38–126)
Anion gap: 8 (ref 5–15)
BUN: 13 mg/dL (ref 8–23)
CO2: 29 mmol/L (ref 22–32)
Calcium: 8.6 mg/dL — ABNORMAL LOW (ref 8.9–10.3)
Chloride: 100 mmol/L (ref 98–111)
Creatinine, Ser: 1.07 mg/dL (ref 0.61–1.24)
GFR, Estimated: >60 mL/min (ref >60)
Glucose, Bld: 259 mg/dL — ABNORMAL HIGH (ref 70–99)
Potassium: 3 mmol/L — ABNORMAL LOW (ref 3.5–5.1)
Sodium: 137 mmol/L (ref 135–145)
Total Bilirubin: 1.4 mg/dL — ABNORMAL HIGH (ref 0.3–1.2)
Total Protein: 7.4 g/dL (ref 6.5–8.1)

## 2022-09-19 LAB — HIV ANTIBODY (ROUTINE TESTING W REFLEX): HIV Screen 4th Generation wRfx: NONREACTIVE

## 2022-09-19 LAB — RESP PANEL BY RT-PCR (RSV, FLU A&B, COVID)  RVPGX2
Influenza A by PCR: NEGATIVE
Influenza B by PCR: NEGATIVE
Resp Syncytial Virus by PCR: NEGATIVE
SARS Coronavirus 2 by RT PCR: NEGATIVE

## 2022-09-19 LAB — BRAIN NATRIURETIC PEPTIDE: B Natriuretic Peptide: 206 pg/mL — ABNORMAL HIGH (ref 0.0–100.0)

## 2022-09-19 LAB — HEMOGLOBIN A1C
Hgb A1c MFr Bld: 7.3 % — ABNORMAL HIGH (ref 4.8–5.6)
Mean Plasma Glucose: 162.81 mg/dL

## 2022-09-19 LAB — GLUCOSE, CAPILLARY
Glucose-Capillary: 382 mg/dL — ABNORMAL HIGH (ref 70–99)
Glucose-Capillary: 411 mg/dL — ABNORMAL HIGH (ref 70–99)
Glucose-Capillary: 366 mg/dL — ABNORMAL HIGH (ref 70–99)
Glucose-Capillary: 333 mg/dL — ABNORMAL HIGH (ref 70–99)
Glucose-Capillary: 330 mg/dL — ABNORMAL HIGH (ref 70–99)

## 2022-09-19 LAB — CREATININE, SERUM
Creatinine, Ser: 1.05 mg/dL (ref 0.61–1.24)
GFR, Estimated: >60 mL/min (ref >60)

## 2022-09-19 LAB — PROTIME-INR
INR: 0.9 (ref 0.8–1.2)
Prothrombin Time: 12.4 seconds (ref 11.4–15.2)

## 2022-09-19 LAB — TROPONIN I (HIGH SENSITIVITY)
Troponin I (High Sensitivity): 19 ng/L — ABNORMAL HIGH (ref <18)
Troponin I (High Sensitivity): 25 ng/L — ABNORMAL HIGH (ref <18)

## 2022-09-19 MED ORDER — GUAIFENESIN-DM 100-10 MG/5ML PO SYRP: 15.0000 mL | ORAL_SOLUTION | ORAL | Status: DC | PRN

## 2022-09-19 MED ORDER — ONDANSETRON HCL 4 MG PO TABS: 4.0000 mg | ORAL_TABLET | Freq: Four times a day (QID) | ORAL | Status: DC | PRN

## 2022-09-19 MED ORDER — SODIUM CHLORIDE 0.9% FLUSH: 3.0000 mL | INTRAVENOUS | Status: DC | PRN

## 2022-09-19 MED ORDER — PREDNISONE 20 MG PO TABS
40.0000 mg | ORAL_TABLET | Freq: Every day | ORAL | Status: DC
  Administered 2022-09-20 – 2022-09-21 (×2): 40 mg via ORAL
  Filled 2022-09-19 (×2): qty 2

## 2022-09-19 MED ORDER — NITROGLYCERIN 0.4 MG SL SUBL: 0.4000 mg | SUBLINGUAL_TABLET | Freq: Once | SUBLINGUAL | Status: DC

## 2022-09-19 MED ORDER — INSULIN ASPART 100 UNIT/ML IJ SOLN
0.0000 [IU] | INTRAMUSCULAR | Status: DC
  Administered 2022-09-19: 20 [IU] via SUBCUTANEOUS
  Administered 2022-09-19 (×2): 15 [IU] via SUBCUTANEOUS
  Administered 2022-09-20: 11 [IU] via SUBCUTANEOUS
  Administered 2022-09-20: 15 [IU] via SUBCUTANEOUS
  Administered 2022-09-20: 11 [IU] via SUBCUTANEOUS

## 2022-09-19 MED ORDER — IPRATROPIUM-ALBUTEROL 0.5-2.5 (3) MG/3ML IN SOLN
3.0000 mL | Freq: Once | RESPIRATORY_TRACT | Status: AC
  Administered 2022-09-19: 3 mL via RESPIRATORY_TRACT
  Filled 2022-09-19: qty 3

## 2022-09-19 MED ORDER — ACETAMINOPHEN 325 MG PO TABS
650.0000 mg | ORAL_TABLET | Freq: Four times a day (QID) | ORAL | Status: DC | PRN
  Administered 2022-09-19 – 2022-09-23 (×6): 650 mg via ORAL
  Filled 2022-09-19 (×6): qty 2

## 2022-09-19 MED ORDER — ISOSORBIDE MONONITRATE ER 60 MG PO TB24
30.0000 mg | ORAL_TABLET | Freq: Every day | ORAL | Status: DC
  Administered 2022-09-19 – 2022-09-23 (×5): 30 mg via ORAL
  Filled 2022-09-19 (×5): qty 1

## 2022-09-19 MED ORDER — IPRATROPIUM-ALBUTEROL 0.5-2.5 (3) MG/3ML IN SOLN
3.0000 mL | Freq: Four times a day (QID) | RESPIRATORY_TRACT | Status: DC
  Administered 2022-09-19 (×2): 3 mL via RESPIRATORY_TRACT
  Filled 2022-09-19: qty 3

## 2022-09-19 MED ORDER — DILTIAZEM HCL ER COATED BEADS 180 MG PO CP24: 360.0000 mg | ORAL_CAPSULE | Freq: Every day | ORAL | Status: DC

## 2022-09-19 MED ORDER — IPRATROPIUM-ALBUTEROL 0.5-2.5 (3) MG/3ML IN SOLN
3.0000 mL | Freq: Once | RESPIRATORY_TRACT | Status: AC
Start: 2022-09-19 — End: 2022-09-19
  Administered 2022-09-19: 3 mL via RESPIRATORY_TRACT
  Filled 2022-09-19: qty 3

## 2022-09-19 MED ORDER — ACETAMINOPHEN 650 MG RE SUPP: 650.0000 mg | Freq: Four times a day (QID) | RECTAL | Status: DC | PRN

## 2022-09-19 MED ORDER — GABAPENTIN 100 MG PO CAPS
100.0000 mg | ORAL_CAPSULE | Freq: Three times a day (TID) | ORAL | Status: DC
  Administered 2022-09-19 – 2022-09-23 (×14): 100 mg via ORAL
  Filled 2022-09-19 (×15): qty 1

## 2022-09-19 MED ORDER — INSULIN ASPART 100 UNIT/ML IJ SOLN
0.0000 [IU] | Freq: Three times a day (TID) | INTRAMUSCULAR | Status: DC
  Administered 2022-09-19: 15 [IU] via SUBCUTANEOUS

## 2022-09-19 MED ORDER — PANTOPRAZOLE SODIUM 40 MG PO TBEC
40.0000 mg | DELAYED_RELEASE_TABLET | Freq: Every day | ORAL | Status: DC
  Administered 2022-09-20 – 2022-09-23 (×4): 40 mg via ORAL
  Filled 2022-09-19 (×4): qty 1

## 2022-09-19 MED ORDER — HYDROMORPHONE HCL 1 MG/ML IJ SOLN: 0.5000 mg | INTRAMUSCULAR | Status: DC | PRN

## 2022-09-19 MED ORDER — NEBIVOLOL HCL 10 MG PO TABS
40.0000 mg | ORAL_TABLET | Freq: Every day | ORAL | Status: DC
  Administered 2022-09-19 – 2022-09-23 (×5): 40 mg via ORAL
  Filled 2022-09-19 (×8): qty 4

## 2022-09-19 MED ORDER — INSULIN GLARGINE-YFGN 100 UNIT/ML ~~LOC~~ SOLN
40.0000 [IU] | Freq: Every day | SUBCUTANEOUS | Status: DC
  Administered 2022-09-19: 40 [IU] via SUBCUTANEOUS
  Filled 2022-09-19 (×3): qty 0.4

## 2022-09-19 MED ORDER — FLUTICASONE PROPIONATE 50 MCG/ACT NA SUSP
2.0000 | Freq: Every day | NASAL | Status: DC
  Administered 2022-09-19 – 2022-09-23 (×5): 2 via NASAL
  Filled 2022-09-19: qty 16

## 2022-09-19 MED ORDER — ONDANSETRON HCL 4 MG/2ML IJ SOLN: 4.0000 mg | Freq: Four times a day (QID) | INTRAMUSCULAR | Status: DC | PRN

## 2022-09-19 MED ORDER — ROSUVASTATIN CALCIUM 20 MG PO TABS
40.0000 mg | ORAL_TABLET | Freq: Every day | ORAL | Status: DC
  Administered 2022-09-19 – 2022-09-23 (×5): 40 mg via ORAL
  Filled 2022-09-19 (×5): qty 2

## 2022-09-19 MED ORDER — MONTELUKAST SODIUM 10 MG PO TABS
10.0000 mg | ORAL_TABLET | Freq: Every day | ORAL | Status: DC
  Administered 2022-09-19 – 2022-09-23 (×5): 10 mg via ORAL
  Filled 2022-09-19 (×5): qty 1

## 2022-09-19 MED ORDER — ASPIRIN 81 MG PO TBEC
81.0000 mg | DELAYED_RELEASE_TABLET | Freq: Every day | ORAL | Status: DC
  Administered 2022-09-19 – 2022-09-23 (×5): 81 mg via ORAL
  Filled 2022-09-19 (×5): qty 1

## 2022-09-19 MED ORDER — ENOXAPARIN SODIUM 40 MG/0.4ML IJ SOSY
40.0000 mg | PREFILLED_SYRINGE | INTRAMUSCULAR | Status: DC
  Administered 2022-09-19 – 2022-09-23 (×5): 40 mg via SUBCUTANEOUS
  Filled 2022-09-19 (×5): qty 0.4

## 2022-09-19 MED ORDER — SODIUM CHLORIDE 0.9% FLUSH
3.0000 mL | Freq: Two times a day (BID) | INTRAVENOUS | Status: DC
  Administered 2022-09-19 – 2022-09-23 (×9): 3 mL via INTRAVENOUS

## 2022-09-19 MED ORDER — IPRATROPIUM-ALBUTEROL 0.5-2.5 (3) MG/3ML IN SOLN
3.0000 mL | Freq: Two times a day (BID) | RESPIRATORY_TRACT | Status: DC
  Administered 2022-09-20 – 2022-09-23 (×7): 3 mL via RESPIRATORY_TRACT
  Filled 2022-09-19 (×7): qty 3

## 2022-09-19 MED ORDER — BUDESONIDE 0.25 MG/2ML IN SUSP
0.2500 mg | Freq: Two times a day (BID) | RESPIRATORY_TRACT | Status: DC
  Administered 2022-09-19 – 2022-09-23 (×9): 0.25 mg via RESPIRATORY_TRACT
  Filled 2022-09-19 (×9): qty 2

## 2022-09-19 MED ORDER — HYDRALAZINE HCL 25 MG PO TABS
100.0000 mg | ORAL_TABLET | Freq: Three times a day (TID) | ORAL | Status: DC
  Administered 2022-09-19 – 2022-09-23 (×14): 100 mg via ORAL
  Filled 2022-09-19 (×22): qty 4

## 2022-09-19 MED ORDER — TAMSULOSIN HCL 0.4 MG PO CAPS
0.4000 mg | ORAL_CAPSULE | Freq: Every day | ORAL | Status: DC
  Administered 2022-09-19 – 2022-09-23 (×5): 0.4 mg via ORAL
  Filled 2022-09-19 (×5): qty 1

## 2022-09-19 MED ORDER — SODIUM CHLORIDE 0.9 % IV SOLN: 250.0000 mL | INTRAVENOUS | Status: DC | PRN

## 2022-09-19 MED ORDER — PANTOPRAZOLE SODIUM 40 MG IV SOLR
40.0000 mg | INTRAVENOUS | Status: DC
  Administered 2022-09-19: 40 mg via INTRAVENOUS
  Filled 2022-09-19: qty 10

## 2022-09-19 MED ORDER — FUROSEMIDE 10 MG/ML IJ SOLN
40.0000 mg | Freq: Two times a day (BID) | INTRAMUSCULAR | Status: DC
  Administered 2022-09-19 (×2): 40 mg via INTRAVENOUS
  Filled 2022-09-19 (×2): qty 4

## 2022-09-19 MED ORDER — KETOTIFEN FUMARATE 0.035 % OP SOLN: 1.0000 [drp] | Freq: Two times a day (BID) | OPHTHALMIC | Status: DC

## 2022-09-19 MED ORDER — SPIRONOLACTONE 12.5 MG HALF TABLET: 12.5000 mg | ORAL_TABLET | Freq: Every day | ORAL | Status: DC

## 2022-09-19 MED ORDER — INSULIN ASPART 100 UNIT/ML IJ SOLN: 0.0000 [IU] | Freq: Every day | INTRAMUSCULAR | Status: DC

## 2022-09-19 MED ORDER — METHYLPREDNISOLONE SODIUM SUCC 125 MG IJ SOLR
125.0000 mg | Freq: Once | INTRAMUSCULAR | Status: AC
Start: 2022-09-19 — End: 2022-09-19
  Administered 2022-09-19: 125 mg via INTRAVENOUS
  Filled 2022-09-19: qty 2

## 2022-09-19 NOTE — Progress Notes (Signed)
Date and time results received: 09/19/22 1230  (use smartphrase ".now" to insert current time)  Test: CBG Critical Value: 411  Name of Provider Notified: shah,Pratik DO  Orders Received? Or Actions Taken?: Orders Received - See Orders for details

## 2022-09-19 NOTE — ED Provider Notes (Signed)
[facility] Emergency Department Provider Note MRN:  [id number]  Arrival date & time: 09/19/22     Chief Complaint   Respiratory Distress   History of Present Illness   Vincent Ramos is a 72 y.o. year-old male with a history of HF, COPD presenting to the ED with chief complaint of respiratory distress.  Progressive shortness of breath over the past few days, much worse this evening.  Endorsing some burning in the chest.  Feels like he has some water retention recently.  Review of Systems  A thorough review of systems was obtained and all systems are negative except as noted in the HPI and PMH.   Patient's Health History    Past Medical History:  Diagnosis Date   Abnormal nuclear cardiac imaging test 12/27/2017   Acute on chronic diastolic CHF (congestive heart failure) ([place]) 09/17/2017   Acute respiratory failure with hypoxia (HCC) 06/01/2017   Arthritis    Asthma    CAD (coronary artery disease)    a. 11/2017: cath showing widely patent coronary arteries with normal left main, 30% mid LAD, luminal irregularities in the proximal and mid circumflex, and 30 to 40% stenosis in the mid RCA.   CAP (community acquired pneumonia) 07/31/2014   Community acquired pneumonia 07/31/2014   Constipation 02/06/2018   COPD exacerbation ([place]) 09/17/2017   Dysphagia    Elevated troponin 09/17/2017   Enlarged prostate    Grade II diastolic dysfunction    Hypertension    Lumbar stenosis 10/07/2015   Obesity 07/31/2014   PNA (pneumonia) 05/30/2017   Pneumonia    Pneumonia    Polyp of rectum    Respiratory failure ([place]) 09/18/2017   SBO (small bowel obstruction) ([place]) 05/08/2019   Shortness of breath    Sleep apnea    Sleep Study 08/21/17: Severe OSA   Type 2 diabetes mellitus ([place])     Past Surgical History:  Procedure Laterality Date   ANTERIOR CERVICAL DECOMP/DISCECTOMY FUSION N/A 09/17/2020   Procedure: Anterior Cevrvical Decompresion / Discectomy Fusion Cervical  four-five,Cervical five-six,Cervical six- seven;  Surgeon: Vallarie Mare, MD;  Location: [place];  Service: Neurosurgery;  Laterality: N/A;   CIRCUMCISION     COLONOSCOPY WITH PROPOFOL N/A 08/08/2017   Procedure: COLONOSCOPY WITH PROPOFOL;  Surgeon: Danie Binder, MD;  [facility];  Service: Endoscopy;  Laterality: N/A;  1:00pm   ESOPHAGEAL DILATION N/A 06/07/2017   Procedure: ESOPHAGEAL DILATION;  Surgeon: Daneil Dolin, MD;  [facility];  Service: Endoscopy;  Laterality: N/A;   ESOPHAGOGASTRODUODENOSCOPY (EGD) WITH PROPOFOL N/A 06/07/2017   Procedure: ESOPHAGOGASTRODUODENOSCOPY (EGD) WITH PROPOFOL;  Surgeon: Daneil Dolin, MD;  [facility];  Service: Endoscopy;  Laterality: N/A;   HYDROCELE EXCISION  10/14/2011   Procedure: HYDROCELECTOMY ADULT;  Surgeon: Malka So, MD;  [facility];  Service: Urology;  Laterality: Right;   LACERATION REPAIR     left hand   LUMBAR LAMINECTOMY/DECOMPRESSION MICRODISCECTOMY Right 10/07/2015   Procedure: Right Lumbar four-five ,lumbar five sacral-one Laminectomy;  Surgeon: Leeroy Cha, MD;  Location: [place] [facility];  Service: Neurosurgery;  Laterality: Right;   RIGHT/LEFT HEART CATH AND CORONARY ANGIOGRAPHY N/A 12/27/2017   Procedure: RIGHT/LEFT HEART CATH AND CORONARY ANGIOGRAPHY;  Surgeon: Belva Crome, MD;  Location: [place] [facility];  Service: Cardiovascular;  Laterality: N/A;    Family History  Problem Relation Age of Onset   Diabetes Other    Hypertension Other    CAD  Other    Cancer Other    Lung cancer Mother        smoker   Diabetes Sister    Hypertension Sister    Anesthesia problems Neg Hx    Hypotension Neg Hx    Malignant hyperthermia Neg Hx    Pseudochol deficiency Neg Hx    Colon cancer Neg Hx    Colon polyps Neg Hx     Social History   Socioeconomic History   Marital status: Married    Spouse name: Not on file   Number of children: Not on file   Years of education: Not on file    Highest education level: Not on file  Occupational History   Not on file  Tobacco Use   Smoking status: Never   Smokeless tobacco: Never  Vaping Use   Vaping Use: Never used  Substance and Sexual Activity   Alcohol use: No    Alcohol/week: 0.0 standard drinks of alcohol    Comment: rare beer   Drug use: No   Sexual activity: Yes    Birth control/protection: None  Other Topics Concern   Not on file  Social History Narrative   Not on file   Social Determinants of Health   Financial Resource Strain: Not on file  Food Insecurity: Not on file  Transportation Needs: Not on file  Physical Activity: Not on file  Stress: Not on file  Social Connections: Not on file  Intimate Partner Violence: Not on file     Physical Exam   Vitals:   09/19/22 0600 09/19/22 0630  BP: (!) 159/70   Pulse: (!) 59 (!) 58  Resp: 20 15  Temp:    SpO2: 100% 100%    CONSTITUTIONAL: Ill-appearing, moderate respiratory distress NEURO/PSYCH:  Alert and oriented x 3, no focal deficits EYES:  eyes equal and reactive ENT/NECK:  no LAD, no JVD CARDIO: Regular rate, well-perfused, normal S1 and S2 PULM: Scattered rhonchi, tachypneic, increased work of breathing with accessory muscle use GI/GU:  non-distended, non-tender MSK/SPINE:  No gross deformities, no edema SKIN:  no rash, atraumatic   *Additional and/or pertinent findings included in MDM below  Diagnostic and Interventional Summary    EKG Interpretation  Date/Time:  Monday September 19 2022 03:43:13 EST Ventricular Rate:  78 PR Interval:  189 QRS Duration: 93 QT Interval:  395 QTC Calculation: 450 R Axis:   25 Text Interpretation: Sinus rhythm Anterior infarct, old Confirmed by Gerlene Fee [phone]) on 09/19/2022 6:09:36 AM       Labs Reviewed  COMPREHENSIVE METABOLIC PANEL - Abnormal; Notable for the following components:      Result Value   Potassium 3.0 (*)    Glucose, Bld 259 (*)    Calcium 8.6 (*)    Total Bilirubin 1.4 (*)     All other components within normal limits  BRAIN NATRIURETIC PEPTIDE - Abnormal; Notable for the following components:   B Natriuretic Peptide 206.0 (*)    All other components within normal limits  BLOOD GAS, VENOUS - Abnormal; Notable for the following components:   Bicarbonate 36.3 (*)    Acid-Base Excess 8.8 (*)    All other components within normal limits  TROPONIN I (HIGH SENSITIVITY) - Abnormal; Notable for the following components:   Troponin I (High Sensitivity) 19 (*)    All other components within normal limits  TROPONIN I (HIGH SENSITIVITY) - Abnormal; Notable for the following components:   Troponin I (High Sensitivity) 25 (*)  All other components within normal limits  RESP PANEL BY RT-PCR (RSV, FLU A&B, COVID)  [id number]  CBC  PROTIME-INR    DG Chest Port 1 View  Final Result      Medications  ipratropium-albuterol (DUONEB) 0.5-2.5 (3) MG/3ML nebulizer solution 3 mL (3 mLs Nebulization Given 09/19/22 0356)  ipratropium-albuterol (DUONEB) 0.5-2.5 (3) MG/3ML nebulizer solution 3 mL (3 mLs Nebulization Given 09/19/22 0355)  methylPREDNISolone sodium succinate (SOLU-MEDROL) 125 mg/2 mL injection 125 mg (125 mg Intravenous Given 09/19/22 0352)     Procedures  /  Critical Care .Critical Care  Performed by: Maudie Flakes, MD Authorized by: Maudie Flakes, MD   Critical care provider statement:    Critical care time (minutes):  35   Critical care was necessary to treat or prevent imminent or life-threatening deterioration of the following conditions:  Respiratory failure   Critical care was time spent personally by me on the following activities:  Development of treatment plan with patient or surrogate, discussions with consultants, evaluation of patient's response to treatment, examination of patient, ordering and review of laboratory studies, ordering and review of radiographic studies, ordering and performing treatments and interventions, pulse oximetry, re-evaluation  of patient's condition and review of old charts   ED Course and Medical Decision Making  Initial Impression and Ddx Suspect CHF versus COPD exacerbation with hypoxic respiratory failure.  Arriving with increased work of breathing, moderate to severe respiratory distress.  Still with good mentation.  Room air oxygen saturation in the 60s, improving to the 80s on nasal cannula.  Starting BiPAP.  Past medical/surgical history that increases complexity of ED encounter: CHF, COPD  Interpretation of Diagnostics I personally reviewed the EKG and my interpretation is as follows: Sinus rhythm  Labs overall reassuring with no significant blood count or electrolyte disturbance, mild hypokalemia, mild elevation in BNP, troponin.  Patient Reassessment and Ultimate Disposition/Management     Patient improving on BiPAP, no hypercarbia, question multifactorial CHF and/or COPD, admitted to medicine.  Patient management required discussion with the following services or consulting groups:  Hospitalist Service  Complexity of Problems Addressed Acute illness or injury that poses threat of life of bodily function  Additional Data Reviewed and Analyzed Further history obtained from: EMS on arrival  Additional Factors Impacting ED Encounter Risk Consideration of hospitalization  Barth Kirks. Sedonia Small, [place] [email or web address]  Final Clinical Impressions(s) / ED Diagnoses     ICD-10-CM   1. Acute respiratory failure with hypoxia (HCC)  J96.01       ED Discharge Orders     None        Discharge Instructions Discussed with and Provided to Patient:   Discharge Instructions   None      Maudie Flakes, MD 09/19/22 [phone]

## 2022-09-19 NOTE — ED Notes (Signed)
ED TO INPATIENT HANDOFF REPORT  ED Nurse Name and Phone #: [phone]  S Name/Age/Gender Vincent Ramos 72 y.o. male Room/Bed: APA10/APA10  Code Status   Code Status: Full Code  Home/SNF/Other Home Patient oriented to: self, place, time, and situation Is this baseline? Yes   Triage Complete: Triage complete  Chief Complaint Acute respiratory failure with hypoxia ([place]) [J96.01]  Triage Note Pt arrives POV c/o SOB for the past week that increased tonight. 66% RA on arrival.    Allergies Allergies  Allergen Reactions   Amlodipine     Felt "jittery" constantly   Novolog [Insulin Aspart] Hives, Itching and Rash   Victoza [Liraglutide] Itching and Rash    Level of Care/Admitting Diagnosis ED Disposition     ED Disposition  Admit   Condition  --   [place]: [facility] [id number]  Level of Care: Telemetry [5]  Covid Evaluation: Confirmed COVID Negative  Diagnosis: Acute respiratory failure with hypoxia [facility]) [id number]  Admitting Physician: Rolla Plate [id number]  Attending Physician: Rolla Plate [id number]  Certification:: I certify this patient will need inpatient services for at least 2 midnights  Estimated Length of Stay: 2          B Medical/Surgery History Past Medical History:  Diagnosis Date   Abnormal nuclear cardiac imaging test 12/27/2017   Acute on chronic diastolic CHF (congestive heart failure) ([place]) 09/17/2017   Acute respiratory failure with hypoxia ([place]) 06/01/2017   Arthritis    Asthma    CAD (coronary artery disease)    a. 11/2017: cath showing widely patent coronary arteries with normal left main, 30% mid LAD, luminal irregularities in the proximal and mid circumflex, and 30 to 40% stenosis in the mid RCA.   CAP (community acquired pneumonia) 07/31/2014   Community acquired pneumonia 07/31/2014   Constipation 02/06/2018   COPD exacerbation ([place]) 09/17/2017   Dysphagia    Elevated troponin 09/17/2017    Enlarged prostate    Grade II diastolic dysfunction    Hypertension    Lumbar stenosis 10/07/2015   Obesity 07/31/2014   PNA (pneumonia) 05/30/2017   Pneumonia    Pneumonia    Polyp of rectum    Respiratory failure ([place]) 09/18/2017   SBO (small bowel obstruction) ([place]) 05/08/2019   Shortness of breath    Sleep apnea    Sleep Study 08/21/17: Severe OSA   Type 2 diabetes mellitus ([place])    Past Surgical History:  Procedure Laterality Date   ANTERIOR CERVICAL DECOMP/DISCECTOMY FUSION N/A 09/17/2020   Procedure: Anterior Cevrvical Decompresion / Discectomy Fusion Cervical four-five,Cervical five-six,Cervical six- seven;  Surgeon: Vallarie Mare, MD;  Location: [place];  Service: Neurosurgery;  Laterality: N/A;   CIRCUMCISION     COLONOSCOPY WITH PROPOFOL N/A 08/08/2017   Procedure: COLONOSCOPY WITH PROPOFOL;  Surgeon: Danie Binder, MD;  [facility];  Service: Endoscopy;  Laterality: N/A;  1:00pm   ESOPHAGEAL DILATION N/A 06/07/2017   Procedure: ESOPHAGEAL DILATION;  Surgeon: Daneil Dolin, MD;  [facility];  Service: Endoscopy;  Laterality: N/A;   ESOPHAGOGASTRODUODENOSCOPY (EGD) WITH PROPOFOL N/A 06/07/2017   Procedure: ESOPHAGOGASTRODUODENOSCOPY (EGD) WITH PROPOFOL;  Surgeon: Daneil Dolin, MD;  [facility];  Service: Endoscopy;  Laterality: N/A;   HYDROCELE EXCISION  10/14/2011   Procedure: HYDROCELECTOMY ADULT;  Surgeon: Malka So, MD;  Location: [facility];  Service: Urology;  Laterality: Right;   LACERATION REPAIR     left hand  LUMBAR LAMINECTOMY/DECOMPRESSION MICRODISCECTOMY Right 10/07/2015   Procedure: Right Lumbar four-five ,lumbar five sacral-one Laminectomy;  Surgeon: Leeroy Cha, MD;  Location: [place] [facility];  Service: Neurosurgery;  Laterality: Right;   RIGHT/LEFT HEART CATH AND CORONARY ANGIOGRAPHY N/A 12/27/2017   Procedure: RIGHT/LEFT HEART CATH AND CORONARY ANGIOGRAPHY;  Surgeon: Belva Crome, MD;  Location: [place] [facility];   Service: Cardiovascular;  Laterality: N/A;     A IV Location/Drains/Wounds Patient Lines/Drains/Airways Status     Active Line/Drains/Airways     Name Placement date Placement time Site Days   Peripheral IV 09/19/22 18 G Right Antecubital 09/19/22  0342  Antecubital  less than 1   Open Drain 1 Right Other (Comment) 10/14/11  1246  Other (Comment)  3993   Incision 10/14/11 Scrotum Right 10/14/11  1259  -- 3993   Incision (Closed) 10/07/15 Back Other (Comment) 10/07/15  0941  -- 2539   Incision (Closed) 09/17/20 Neck 09/17/20  1332  -- 732            Intake/Output Last 24 hours No intake or output data in the 24 hours ending 09/19/22 0804  Labs/Imaging Results for orders placed or performed during the hospital encounter of 09/19/22 (from the past 48 hour(s))  CBC     Status: None   Collection Time: 09/19/22  3:40 AM  Result Value Ref Range   WBC 9.8 4.0 - 10.5 K/uL   RBC 5.67 4.22 - 5.81 MIL/uL   Hemoglobin 15.2 13.0 - 17.0 g/dL   HCT 48.3 39.0 - 52.0 %   MCV 85.2 80.0 - 100.0 fL   MCH 26.8 26.0 - 34.0 pg   MCHC 31.5 30.0 - 36.0 g/dL   RDW 15.3 11.5 - 15.5 %   Platelets 170 150 - 400 K/uL   nRBC 0.0 0.0 - 0.2 %    Comment: Performed at [facility], [address], [place], [place] [address]  Comprehensive metabolic panel     Status: Abnormal   Collection Time: 09/19/22  3:40 AM  Result Value Ref Range   Sodium 137 135 - 145 mmol/L   Potassium 3.0 (L) 3.5 - 5.1 mmol/L   Chloride 100 98 - 111 mmol/L   CO2 29 22 - 32 mmol/L   Glucose, Bld 259 (H) 70 - 99 mg/dL    Comment: Glucose reference range applies only to samples taken after fasting for at least 8 hours.   BUN 13 8 - 23 mg/dL   Creatinine, Ser 1.07 0.61 - 1.24 mg/dL   Calcium 8.6 (L) 8.9 - 10.3 mg/dL   Total Protein 7.4 6.5 - 8.1 g/dL   Albumin 4.0 3.5 - 5.0 g/dL   AST 21 15 - 41 U/L   ALT 24 0 - 44 U/L   Alkaline Phosphatase 84 38 - 126 U/L   Total Bilirubin 1.4 (H) 0.3 - 1.2 mg/dL   GFR, Estimated >60 >60  mL/min    Comment: (NOTE) Calculated using the CKD-EPI Creatinine Equation (2021)    Anion gap 8 5 - 15    Comment: Performed at [facility], [address] [place], [place] [address]  Troponin I (High Sensitivity)     Status: Abnormal   Collection Time: 09/19/22  3:40 AM  Result Value Ref Range   Troponin I (High Sensitivity) 19 (H) <18 ng/L    Comment: (NOTE) Elevated high sensitivity troponin I (hsTnI) values and significant  changes across serial measurements may suggest ACS but many other  chronic and acute  conditions are known to elevate hsTnI results.  Refer to the "Links" section for chest pain algorithms and additional  guidance. Performed at [facility], [address] [place], [place] [address]   Brain natriuretic peptide     Status: Abnormal   Collection Time: 09/19/22  3:40 AM  Result Value Ref Range   B Natriuretic Peptide 206.0 (H) 0.0 - 100.0 pg/mL    Comment: Performed at [facility], [address] [place], [place] [address]  Protime-INR     Status: None   Collection Time: 09/19/22  3:40 AM  Result Value Ref Range   Prothrombin Time 12.4 11.4 - 15.2 seconds   INR 0.9 0.8 - 1.2    Comment: (NOTE) INR goal varies based on device and disease states. Performed at [facility], [address], [place], [place] [address]   Resp panel by RT-PCR (RSV, Flu A&B, Covid) Anterior Nasal Swab     Status: None   Collection Time: 09/19/22  3:45 AM   Specimen: Anterior Nasal Swab  Result Value Ref Range   SARS Coronavirus 2 by RT PCR NEGATIVE NEGATIVE    Comment: (NOTE) SARS-CoV-2 target nucleic acids are NOT DETECTED.  The SARS-CoV-2 RNA is generally detectable in upper respiratory specimens during the acute phase of infection. The lowest concentration of SARS-CoV-2 viral copies this assay can detect is 138 copies/mL. A negative result does not preclude SARS-Cov-2 infection and should not be used as the sole basis for treatment or other patient management decisions. A  negative result may occur with  improper specimen collection/handling, submission of specimen other than nasopharyngeal swab, presence of viral mutation(s) within the areas targeted by this assay, and inadequate number of viral copies(<138 copies/mL). A negative result must be combined with clinical observations, patient history, and epidemiological information. The expected result is Negative.  Fact Sheet for Patients:  EntrepreneurPulse.com.au  Fact Sheet for Healthcare Providers:  IncredibleEmployment.be  This test is no t yet approved or cleared by the [place] FDA and  has been authorized for detection and/or diagnosis of SARS-CoV-2 by FDA under an Emergency Use Authorization (EUA). This EUA will remain  in effect (meaning this test can be used) for the duration of the COVID-19 declaration under Section 564(b)(1) of the Act, 21 U.S.C.section 360bbb-3(b)(1), unless the authorization is terminated  or revoked sooner.       Influenza A by PCR NEGATIVE NEGATIVE   Influenza B by PCR NEGATIVE NEGATIVE    Comment: (NOTE) The Xpert Xpress SARS-CoV-2/FLU/RSV plus assay is intended as an aid in the diagnosis of influenza from Nasopharyngeal swab specimens and should not be used as a sole basis for treatment. Nasal washings and aspirates are unacceptable for Xpert Xpress SARS-CoV-2/FLU/RSV testing.  Fact Sheet for Patients: EntrepreneurPulse.com.au  Fact Sheet for Healthcare Providers: IncredibleEmployment.be  This test is not yet approved or cleared by the [place] FDA and has been authorized for detection and/or diagnosis of SARS-CoV-2 by FDA under an Emergency Use Authorization (EUA). This EUA will remain in effect (meaning this test can be used) for the duration of the COVID-19 declaration under Section 564(b)(1) of the Act, 21 U.S.C. section 360bbb-3(b)(1), unless the authorization is terminated  or revoked.     Resp Syncytial Virus by PCR NEGATIVE NEGATIVE    Comment: (NOTE) Fact Sheet for Patients: EntrepreneurPulse.com.au  Fact Sheet for Healthcare Providers: IncredibleEmployment.be  This test is not yet approved or cleared by the [place] FDA and has been authorized for detection and/or diagnosis  of SARS-CoV-2 by FDA under an Emergency Use Authorization (EUA). This EUA will remain in effect (meaning this test can be used) for the duration of the COVID-19 declaration under Section 564(b)(1) of the Act, 21 U.S.C. section 360bbb-3(b)(1), unless the authorization is terminated or revoked.  Performed at [facility] [address], [place], [place] [address]   Blood gas, venous     Status: Abnormal   Collection Time: 09/19/22  3:50 AM  Result Value Ref Range   FIO2 21.0 %   pH, Ven 7.4 7.25 - 7.43   pCO2, Ven 59 44 - 60 mmHg   pO2, Ven 32 32 - 45 mmHg   Bicarbonate 36.3 (H) 20.0 - 28.0 mmol/L   Acid-Base Excess 8.8 (H) 0.0 - 2.0 mmol/L   O2 Saturation 50.4 %   Patient temperature 36.5    Collection site RIGHT ANTECUBITAL    Drawn by [phone]     Comment: Performed at [facility], [address], [place], [place] [address]  Troponin I (High Sensitivity)     Status: Abnormal   Collection Time: 09/19/22  5:51 AM  Result Value Ref Range   Troponin I (High Sensitivity) 25 (H) <18 ng/L    Comment: (NOTE) Elevated high sensitivity troponin I (hsTnI) values and significant  changes across serial measurements may suggest ACS but many other  chronic and acute conditions are known to elevate hsTnI results.  Refer to the "Links" section for chest pain algorithms and additional  guidance. Performed at [facility], [address] [place], [place] [address]    DG Chest Port 1 View  Result Date: 09/19/2022 CLINICAL DATA:  Shortness of breath. EXAM: PORTABLE CHEST 1 VIEW COMPARISON:  02/03/2018. FINDINGS: The heart is borderline enlarged  and mediastinal contours are within normal limits. There is atherosclerotic calcification of the aorta. The pulmonary vasculature is mildly distended. There is chronic elevation of the right diaphragm. Mild airspace disease is noted at the left lung base. There is a small left pleural effusion. No pneumothorax. Cervical spinal fusion hardware is noted. IMPRESSION: 1. Borderline cardiomegaly with pulmonary vascular congestion. 2. Small left pleural effusion with atelectasis, edema, or infiltrate at the left lung base. Electronically Signed   By: Brett Fairy M.D.   On: 09/19/2022 04:08    Pending Labs Unresulted Labs (From admission, onward)     Start     Ordered   09/26/22 0500  Creatinine, serum  (enoxaparin (LOVENOX)    CrCl >/= 30 ml/min)  Weekly,   R     Comments: while on enoxaparin therapy    09/19/22 0742   09/20/22 0500  Magnesium  Tomorrow morning,   R        09/19/22 0742   09/20/22 [id number]  Basic metabolic panel  Tomorrow morning,   R        09/19/22 0742   09/20/22 0500  CBC  Tomorrow morning,   R        09/19/22 0742   09/19/22 0742  Hemoglobin A1c  Once,   R       Comments: To assess prior glycemic control    09/19/22 0742   09/19/22 0741  HIV Antibody (routine testing w rflx)  (HIV Antibody (Routine testing w reflex) panel)  Once,   R        09/19/22 0742   09/19/22 0741  CBC  (enoxaparin (LOVENOX)    CrCl >/= 30 ml/min)  Once,   R  Comments: Baseline for enoxaparin therapy IF NOT ALREADY DRAWN.  Notify MD if PLT < 100 K.    09/19/22 0742   09/19/22 0741  Creatinine, serum  (enoxaparin (LOVENOX)    CrCl >/= 30 ml/min)  Once,   R       Comments: Baseline for enoxaparin therapy IF NOT ALREADY DRAWN.    09/19/22 0742            Vitals/Pain Today's Vitals   09/19/22 0703 09/19/22 0709 09/19/22 0721 09/19/22 0801  BP:   (!) 145/71   Pulse:   65   Resp:   16   Temp:  98.2 F (36.8 C)    TempSrc:  Axillary    SpO2:   96% 96%  Weight:      Height:       PainSc: 5        Isolation Precautions No active isolations  Medications Medications  pantoprazole (PROTONIX) injection 40 mg (has no administration in time range)  furosemide (LASIX) injection 40 mg (has no administration in time range)  enoxaparin (LOVENOX) injection 40 mg (has no administration in time range)  sodium chloride flush (NS) 0.9 % injection 3 mL (has no administration in time range)  sodium chloride flush (NS) 0.9 % injection 3 mL (has no administration in time range)  0.9 %  sodium chloride infusion (has no administration in time range)  acetaminophen (TYLENOL) tablet 650 mg (has no administration in time range)    Or  acetaminophen (TYLENOL) suppository 650 mg (has no administration in time range)  HYDROmorphone (DILAUDID) injection 0.5-1 mg (has no administration in time range)  ondansetron (ZOFRAN) tablet 4 mg (has no administration in time range)    Or  ondansetron (ZOFRAN) injection 4 mg (has no administration in time range)  guaiFENesin-dextromethorphan (ROBITUSSIN DM) 100-10 MG/5ML syrup 15 mL (has no administration in time range)  budesonide (PULMICORT) nebulizer solution 0.25 mg (0.25 mg Nebulization Given 09/19/22 0801)  ipratropium-albuterol (DUONEB) 0.5-2.5 (3) MG/3ML nebulizer solution 3 mL (has no administration in time range)  predniSONE (DELTASONE) tablet 40 mg (has no administration in time range)  insulin aspart (novoLOG) injection 0-5 Units (has no administration in time range)  insulin aspart (novoLOG) injection 0-15 Units (has no administration in time range)  aspirin EC tablet 81 mg (has no administration in time range)  Nebivolol HCl TABS 40 mg (has no administration in time range)  diltiazem (CARDIZEM CD) 24 hr capsule 360 mg (has no administration in time range)  hydrALAZINE (APRESOLINE) tablet 100 mg (has no administration in time range)  isosorbide mononitrate (IMDUR) 24 hr tablet 30 mg (has no administration in time range)  rosuvastatin  (CRESTOR) tablet 40 mg (has no administration in time range)  spironolactone (ALDACTONE) tablet 12.5 mg (has no administration in time range)  tamsulosin (FLOMAX) capsule 0.4 mg (has no administration in time range)  gabapentin (NEURONTIN) capsule 100 mg (has no administration in time range)  fluticasone (FLONASE) 50 MCG/ACT nasal spray 2 spray (has no administration in time range)  montelukast (SINGULAIR) tablet 10 mg (has no administration in time range)  ketotifen (ZADITOR) 0.035 % ophthalmic solution 1 drop (has no administration in time range)  insulin glargine-yfgn (SEMGLEE) injection 40 Units (has no administration in time range)  ipratropium-albuterol (DUONEB) 0.5-2.5 (3) MG/3ML nebulizer solution 3 mL (3 mLs Nebulization Given 09/19/22 0356)  ipratropium-albuterol (DUONEB) 0.5-2.5 (3) MG/3ML nebulizer solution 3 mL (3 mLs Nebulization Given 09/19/22 0355)  methylPREDNISolone sodium succinate (SOLU-MEDROL) 125 mg/2 mL  injection 125 mg (125 mg Intravenous Given 09/19/22 0352)    Mobility walks     Focused Assessments Pulmonary Assessment Handoff:  Lung sounds: Bilateral Breath Sounds: Diminished O2 Device: Nasal Cannula O2 Flow Rate (L/min): 3 L/min    R Recommendations: See Admitting Provider Note  Report given to:   Additional Notes: [id number]

## 2022-09-19 NOTE — Progress Notes (Signed)
Patient taken off of BIPAP and placed on 3L nasal cannula. Patient is tolerating well and says as far as his breathing he feels like he is back to baseline. BIPAP is at bedside if and when needed.

## 2022-09-19 NOTE — Progress Notes (Signed)
Bipap order is PRN, not needed at this time, will continue to assess.

## 2022-09-19 NOTE — ED Notes (Signed)
Went in to introduce self and check on pt c/o mid sternal pain burning, reports coughing a lot sent MD message.

## 2022-09-19 NOTE — Inpatient Diabetes Management (Signed)
Inpatient Diabetes Program Recommendations  AACE/ADA: New Consensus Statement on Inpatient Glycemic Control (2015)  Target Ranges:  Prepandial:   less than 140 mg/dL      Peak postprandial:   less than 180 mg/dL (1-2 hours)      Critically ill patients:  140 - 180 mg/dL   Lab Results  Component Value Date   GLUCAP 382 (H) 09/19/2022   HGBA1C 7.7 (H) 09/15/2020    Review of Glycemic Control  Latest Reference Range & Units 09/19/22 10:07  Glucose-Capillary 70 - 99 mg/dL 382 (H)  (H): Data is abnormally high Diabetes history: Type 2 DM Outpatient Diabetes medications: Trulicity 1.5 mg qwk, Tresiba 80 units QD, Januvia 100 mg QD, Glipizide 10 mg BID Current orders for Inpatient glycemic control: Novolog 0-15 units TID & HS, Semglee 40 units QD Solumedrol 125 mg x 1, to taper to Prednisone 40 mg QA   Inpatient Diabetes Program Recommendations:    Noted consult.  A1C pending.  In agreement with current orders.  DM team to follow.   Thanks, Bronson Curb, MSN, RNC-OB Diabetes Coordinator [phone] (8a-5p)

## 2022-09-19 NOTE — ED Notes (Signed)
Pt ambulated self to the bedside commode.

## 2022-09-19 NOTE — H&P (Signed)
History and Physical    Vincent Ramos [id number] DOB: October 13, 1950 DOA: 09/19/2022  PCP: Sonia Side., FNP   Patient coming from: Home  Chief Complaint: Respiratory distress  HPI: Vincent Ramos is a 72 y.o. male with medical history significant for chronic diastolic CHF, CAD, COPD, hypertension, dyslipidemia, BPH, and morbid obesity who presented to the ED with worsening respiratory distress over the past few days.  He has also had some cough and burning sensation in his chest.  He states that approximately 1 week ago he was seen by a provider who prescribed some antibiotics for him due to some cough that he was having.  He states that he has been compliant on his home medications and has gained approximately 5 pounds in the last few days and has had some swelling in his lower abdomen as well as his lower extremities.  He denies any fevers, chills, or productive cough.   ED Course: Stable vital signs noted with no fever.  He had oxygen saturations of the 60th percentile and improved to the 80th percentile with nasal cannula and required the use of BiPAP.  He was given breathing treatments and Solu-Medrol in the ED.  Noted to have potassium of 3 and BNP 206 with troponin 25.  Patient was negative for flu or COVID and chest x-ray with some cardiomegaly and pulmonary vascular congestion with small left pleural effusion noted.  Review of Systems: Reviewed as noted above, otherwise negative.  Past Medical History:  Diagnosis Date   Abnormal nuclear cardiac imaging test 12/27/2017   Acute on chronic diastolic CHF (congestive heart failure) ([place]) 09/17/2017   Acute respiratory failure with hypoxia (HCC) 06/01/2017   Arthritis    Asthma    CAD (coronary artery disease)    a. 11/2017: cath showing widely patent coronary arteries with normal left main, 30% mid LAD, luminal irregularities in the proximal and mid circumflex, and 30 to 40% stenosis in the mid RCA.   CAP (community acquired  pneumonia) 07/31/2014   Community acquired pneumonia 07/31/2014   Constipation 02/06/2018   COPD exacerbation ([place]) 09/17/2017   Dysphagia    Elevated troponin 09/17/2017   Enlarged prostate    Grade II diastolic dysfunction    Hypertension    Lumbar stenosis 10/07/2015   Obesity 07/31/2014   PNA (pneumonia) 05/30/2017   Pneumonia    Pneumonia    Polyp of rectum    Respiratory failure ([place]) 09/18/2017   SBO (small bowel obstruction) ([place]) 05/08/2019   Shortness of breath    Sleep apnea    Sleep Study 08/21/17: Severe OSA   Type 2 diabetes mellitus ([place])     Past Surgical History:  Procedure Laterality Date   ANTERIOR CERVICAL DECOMP/DISCECTOMY FUSION N/A 09/17/2020   Procedure: Anterior Cevrvical Decompresion / Discectomy Fusion Cervical four-five,Cervical five-six,Cervical six- seven;  Surgeon: Vallarie Mare, MD;  Location: [facility];  Service: Neurosurgery;  Laterality: N/A;   CIRCUMCISION     COLONOSCOPY WITH PROPOFOL N/A 08/08/2017   Procedure: COLONOSCOPY WITH PROPOFOL;  Surgeon: Danie Binder, MD;  [facility];  Service: Endoscopy;  Laterality: N/A;  1:00pm   ESOPHAGEAL DILATION N/A 06/07/2017   Procedure: ESOPHAGEAL DILATION;  Surgeon: Daneil Dolin, MD;  [facility];  Service: Endoscopy;  Laterality: N/A;   ESOPHAGOGASTRODUODENOSCOPY (EGD) WITH PROPOFOL N/A 06/07/2017   Procedure: ESOPHAGOGASTRODUODENOSCOPY (EGD) WITH PROPOFOL;  Surgeon: Daneil Dolin, MD;  [facility];  Service: Endoscopy;  Laterality: N/A;  HYDROCELE EXCISION  10/14/2011   Procedure: HYDROCELECTOMY ADULT;  Surgeon: Malka So, MD;  [facility];  Service: Urology;  Laterality: Right;   LACERATION REPAIR     left hand   LUMBAR LAMINECTOMY/DECOMPRESSION MICRODISCECTOMY Right 10/07/2015   Procedure: Right Lumbar four-five ,lumbar five sacral-one Laminectomy;  Surgeon: Leeroy Cha, MD;  Location: [facility];  Service: Neurosurgery;  Laterality: Right;   RIGHT/LEFT  HEART CATH AND CORONARY ANGIOGRAPHY N/A 12/27/2017   Procedure: RIGHT/LEFT HEART CATH AND CORONARY ANGIOGRAPHY;  Surgeon: Belva Crome, MD;  Location: [facility];  Service: Cardiovascular;  Laterality: N/A;     reports that he has never smoked. He has never used smokeless tobacco. He reports that he does not drink alcohol and does not use drugs.  Allergies  Allergen Reactions   Amlodipine     Felt "jittery" constantly   Novolog [Insulin Aspart] Hives, Itching and Rash   Victoza [Liraglutide] Itching and Rash    Family History  Problem Relation Age of Onset   Diabetes Other    Hypertension Other    CAD Other    Cancer Other    Lung cancer Mother        smoker   Diabetes Sister    Hypertension Sister    Anesthesia problems Neg Hx    Hypotension Neg Hx    Malignant hyperthermia Neg Hx    Pseudochol deficiency Neg Hx    Colon cancer Neg Hx    Colon polyps Neg Hx     Prior to Admission medications   Medication Sig Start Date End Date Taking? Authorizing Provider  albuterol (VENTOLIN HFA) 108 (90 Base) MCG/ACT inhaler Inhale 2 puffs into the lungs every 6 (six) hours as needed for wheezing or shortness of breath.    [provider]  aspirin EC 81 MG tablet Take 1 tablet (81 mg total) by mouth daily with breakfast. 05/12/19   Emokpae, Courage, MD  azelastine (ASTELIN) 0.1 % nasal spray Place 2 sprays into both nostrils 2 (two) times daily. Use in each nostril as directed Patient taking differently: Place 2 sprays into both nostrils 2 (two) times daily as needed for allergies. Use in each nostril as directed 10/15/19   Noemi Chapel P, DO  azelastine (OPTIVAR) 0.05 % ophthalmic solution Place 1 drop into the left eye See admin instructions. Instill 1 drop into both eyes twice daily for 2 days the day of and the day after monthly eye injections 12/09/19   [provider]  BYSTOLIC 20 MG TABS TAKE 2 TABLETS BY MOUTH DAILY. 02/23/21   Richardson Dopp T, PA-C   cephALEXin (KEFLEX) 500 MG capsule Take 1 capsule (500 mg total) by mouth 4 (four) times daily. 04/08/22   Hezzie Bump, NP  diltiazem (CARDIZEM CD) 360 MG 24 hr capsule Take 1 capsule (360 mg total) by mouth daily. 09/20/20   Kipp Brood, MD  Fluticasone-Umeclidin-Vilant (TRELEGY ELLIPTA) 100-62.5-25 MCG/INH AEPB Inhale 1 puff into the lungs daily.    [provider]  furosemide (LASIX) 40 MG tablet Take 40 mg by mouth daily as needed for edema.    [provider]  gabapentin (NEURONTIN) 300 MG capsule Take 100 mg by mouth 3 (three) times daily.  09/25/19   [provider]  glipiZIDE (GLUCOTROL) 10 MG tablet Take 10 mg by mouth 2 (two) times daily. 07/04/20   [provider]  hydrALAZINE (APRESOLINE) 100 MG tablet Take 1 tablet (100 mg total) by mouth 3 (  three) times daily. With meals. Please make overdue appt with Dr. Irish Lack before anymore refills. Thank you 1st attempt 01/04/21   Jettie Booze, MD  isosorbide mononitrate (IMDUR) 30 MG 24 hr tablet Take 1 tablet (30 mg total) by mouth daily. 05/13/19   Roxan Hockey, MD  Multiple Vitamin (MULTIVITAMIN WITH MINERALS) TABS tablet Take 1 tablet by mouth daily.    [provider]  olmesartan-hydrochlorothiazide (BENICAR HCT) 40-25 MG tablet Take 1 tablet by mouth daily. 11/25/19   Jettie Booze, MD  polyethylene glycol (MIRALAX / GLYCOLAX) 17 g packet Take 17 g by mouth daily. Patient taking differently: Take 17 g by mouth daily as needed for moderate constipation. 05/12/19   Roxan Hockey, MD  rosuvastatin (CRESTOR) 40 MG tablet Take 40 mg by mouth daily.    [provider]  sitaGLIPtin (JANUVIA) 100 MG tablet Take 100 mg by mouth daily.    [provider]  spironolactone (ALDACTONE) 25 MG tablet Take 0.5 tablets (12.5 mg total) by mouth daily. Patient not taking: Reported on 11/23/2021 02/14/20   Jettie Booze, MD  tamsulosin (FLOMAX) 0.4 MG CAPS capsule Take 0.4  mg by mouth daily. 09/04/19   [provider]  TRESIBA FLEXTOUCH 100 UNIT/ML FlexTouch Pen Inject 80 Units into the skin daily. 08/11/20   [provider]  TRULICITY 1.5 0000000 SOPN Inject 1.5 mg into the skin every Sunday. 04/12/17   [provider]    Physical Exam: Vitals:   09/19/22 0600 09/19/22 0630 09/19/22 0700 09/19/22 0709  BP: (!) 159/70  (!) 145/71   Pulse: (!) 59 (!) 58 (!) 57   Resp: 20 15 11   $ Temp:    98.2 F (36.8 C)  TempSrc:    Axillary  SpO2: 100% 100% 99%   Weight:      Height:        Constitutional: NAD, calm, comfortable, obese Vitals:   09/19/22 0600 09/19/22 0630 09/19/22 0700 09/19/22 0709  BP: (!) 159/70  (!) 145/71   Pulse: (!) 59 (!) 58 (!) 57   Resp: 20 15 11   $ Temp:    98.2 F (36.8 C)  TempSrc:    Axillary  SpO2: 100% 100% 99%   Weight:      Height:       Eyes: lids and conjunctivae normal Neck: normal, supple Respiratory: clear to auscultation bilaterally. Normal respiratory effort. No accessory muscle use.  Currently on 3 L nasal cannula Cardiovascular: Regular rate and rhythm, no murmurs. Abdomen: no tenderness, no distention. Bowel sounds positive.  Musculoskeletal: Mild edema bilaterally Skin: no rashes, lesions, ulcers.  Psychiatric: Flat affect  Labs on Admission: I have personally reviewed following labs and imaging studies  CBC: Recent Labs  Lab 09/19/22 0340  WBC 9.8  HGB 15.2  HCT 48.3  MCV 85.2  PLT [id number]   Basic Metabolic Panel: Recent Labs  Lab 09/19/22 0340  NA 137  K 3.0*  CL 100  CO2 29  GLUCOSE 259*  BUN 13  CREATININE 1.07  CALCIUM 8.6*   GFR: Estimated Creatinine Clearance: 81.5 mL/min (by C-G formula based on SCr of 1.07 mg/dL). Liver Function Tests: Recent Labs  Lab 09/19/22 0340  AST 21  ALT 24  ALKPHOS 84  BILITOT 1.4*  PROT 7.4  ALBUMIN 4.0   No results for input(s): "LIPASE", "AMYLASE" in the last 168 hours. No results for input(s): "AMMONIA" in the last 168  hours. Coagulation Profile: Recent Labs  Lab 09/19/22 0340  INR 0.9   Cardiac Enzymes: No results for input(s): "CKTOTAL", "CKMB", "CKMBINDEX", "TROPONINI" in the last 168 hours. BNP (last 3 results) No results for input(s): "PROBNP" in the last 8760 hours. HbA1C: No results for input(s): "HGBA1C" in the last 72 hours. CBG: No results for input(s): "GLUCAP" in the last 168 hours. Lipid Profile: No results for input(s): "CHOL", "HDL", "LDLCALC", "TRIG", "CHOLHDL", "LDLDIRECT" in the last 72 hours. Thyroid Function Tests: No results for input(s): "TSH", "T4TOTAL", "FREET4", "T3FREE", "THYROIDAB" in the last 72 hours. Anemia Panel: No results for input(s): "VITAMINB12", "FOLATE", "FERRITIN", "TIBC", "IRON", "RETICCTPCT" in the last 72 hours. Urine analysis:    Component Value Date/Time   COLORURINE AMBER (A) 05/08/2019 1730   APPEARANCEUR HAZY (A) 05/08/2019 1730   LABSPEC 1.029 05/08/2019 1730   PHURINE 5.0 05/08/2019 1730   GLUCOSEU NEGATIVE 05/08/2019 1730   HGBUR NEGATIVE 05/08/2019 1730   BILIRUBINUR NEGATIVE 05/08/2019 1730   KETONESUR 5 (A) 05/08/2019 1730   PROTEINUR 100 (A) 05/08/2019 1730   NITRITE NEGATIVE 05/08/2019 1730   LEUKOCYTESUR NEGATIVE 05/08/2019 1730    Radiological Exams on Admission: DG Chest Port 1 View  Result Date: 09/19/2022 CLINICAL DATA:  Shortness of breath. EXAM: PORTABLE CHEST 1 VIEW COMPARISON:  02/03/2018. FINDINGS: The heart is borderline enlarged and mediastinal contours are within normal limits. There is atherosclerotic calcification of the aorta. The pulmonary vasculature is mildly distended. There is chronic elevation of the right diaphragm. Mild airspace disease is noted at the left lung base. There is a small left pleural effusion. No pneumothorax. Cervical spinal fusion hardware is noted. IMPRESSION: 1. Borderline cardiomegaly with pulmonary vascular congestion. 2. Small left pleural effusion with atelectasis, edema, or infiltrate at the  left lung base. Electronically Signed   By: Brett Fairy M.D.   On: 09/19/2022 04:08    EKG: Independently reviewed. SR 78bpm.  Assessment/Plan Principal Problem:   Acute respiratory failure with hypoxia (HCC) Active Problems:   Type 2 diabetes mellitus (HCC)   Hypertension   Obesity   Lumbar stenosis   COPD exacerbation (HCC)   Acute on chronic diastolic CHF (congestive heart failure) (HCC)    Acute hypoxemic respiratory failure-multifactorial -Likely due to some mild volume overload and possible diastolic CHF exacerbation as well as COPD exacerbation -Weaned off of BiPAP in ED  Mild, acute COPD exacerbation -Does not have chronic hypoxemia -Improved with use of breathing treatments and IV steroids in the ED -Plan to continue scheduled DuoNebs as well as Pulmicort -Oral prednisone 40 mg daily to start 2/20, monitor carefully with type 2 diabetes and hyperglycemia  Acute on chronic diastolic CHF exacerbation -Noted to have BNP elevation as well as chest x-ray with some signs of mild volume overload -Prior echo in 09/2017 with preserved LVEF -Plan to obtain repeat 2D echocardiogram -Lasix IV 40 mg twice daily -Strict I's and O's and daily weights -Monitor repeat labs  Hypokalemia -Replete and reevaluate in a.m.  History of CAD -Negative prior ischemic workup -Follows with Dr. Irish Lack outpatient  Type 2 diabetes with hyperglycemia -Carb modified diet and SSI -Check hemoglobin A1c -Consultation to diabetes coordinator  Hypertension -Continue home blood pressure agents and monitor with diastolic, diltiazem  Dyslipidemia -Continue statin  BPH -Continue tamsulosin  Morbid obesity -BMI 44.55   DVT prophylaxis: Lovenox Code Status: Full Family Communication: None Disposition Plan:Admit for hypoxemia Consults called:None Admission status:   Severity of Illness: The appropriate patient status for this patient is INPATIENT. Inpatient status is judged to be  reasonable  and necessary in order to provide the required intensity of service to ensure the patient's safety. The patient's presenting symptoms, physical exam findings, and initial radiographic and laboratory data in the context of their chronic comorbidities is felt to place them at high risk for further clinical deterioration. Furthermore, it is not anticipated that the patient will be medically stable for discharge from the hospital within 2 midnights of admission.   * I certify that at the point of admission it is my clinical judgment that the patient will require inpatient hospital care spanning beyond 2 midnights from the point of admission due to high intensity of service, high risk for further deterioration and high frequency of surveillance required.*   Lizette Pazos D Ivyana Locey DO [facility]  If 7PM-7AM, please contact night-coverage [email or web address]  09/19/2022, 7:18 AM

## 2022-09-19 NOTE — ED Triage Notes (Signed)
Pt arrives POV c/o SOB for the past week that increased tonight. 66% RA on arrival.

## 2022-09-20 ENCOUNTER — Inpatient Hospital Stay (HOSPITAL_COMMUNITY): Payer: Medicare Other

## 2022-09-20 ENCOUNTER — Other Ambulatory Visit (HOSPITAL_COMMUNITY): Payer: Self-pay | Admitting: *Deleted

## 2022-09-20 DIAGNOSIS — J9601 Acute respiratory failure with hypoxia: Secondary | ICD-10-CM | POA: Diagnosis not present

## 2022-09-20 DIAGNOSIS — I5031 Acute diastolic (congestive) heart failure: Secondary | ICD-10-CM

## 2022-09-20 LAB — GLUCOSE, CAPILLARY
Glucose-Capillary: 252 mg/dL — ABNORMAL HIGH (ref 70–99)
Glucose-Capillary: 264 mg/dL — ABNORMAL HIGH (ref 70–99)
Glucose-Capillary: 284 mg/dL — ABNORMAL HIGH (ref 70–99)
Glucose-Capillary: 296 mg/dL — ABNORMAL HIGH (ref 70–99)
Glucose-Capillary: 339 mg/dL — ABNORMAL HIGH (ref 70–99)
Glucose-Capillary: 371 mg/dL — ABNORMAL HIGH (ref 70–99)
Glucose-Capillary: 377 mg/dL — ABNORMAL HIGH (ref 70–99)

## 2022-09-20 LAB — ECHOCARDIOGRAM COMPLETE
AR max vel: 2.36 cm2
AV Area VTI: 2.55 cm2
AV Area mean vel: 2.62 cm2
AV Mean grad: 8 mmHg
AV Peak grad: 15.1 mmHg
Ao pk vel: 1.94 m/s
Area-P 1/2: 3.48 cm2
Height: 66 in
S' Lateral: 2.8 cm
Weight: 4190.5 oz

## 2022-09-20 LAB — BASIC METABOLIC PANEL
Anion gap: 7 (ref 5–15)
BUN: 24 mg/dL — ABNORMAL HIGH (ref 8–23)
CO2: 28 mmol/L (ref 22–32)
Calcium: 8.5 mg/dL — ABNORMAL LOW (ref 8.9–10.3)
Chloride: 102 mmol/L (ref 98–111)
Creatinine, Ser: 1.27 mg/dL — ABNORMAL HIGH (ref 0.61–1.24)
GFR, Estimated: >60 mL/min (ref >60)
Glucose, Bld: 285 mg/dL — ABNORMAL HIGH (ref 70–99)
Potassium: 3.9 mmol/L (ref 3.5–5.1)
Sodium: 137 mmol/L (ref 135–145)

## 2022-09-20 LAB — CBC
HCT: 43.6 % (ref 39.0–52.0)
Hemoglobin: 13.6 g/dL (ref 13.0–17.0)
MCH: 26.4 pg (ref 26.0–34.0)
MCHC: 31.2 g/dL (ref 30.0–36.0)
MCV: 84.7 fL (ref 80.0–100.0)
Platelets: 152 10*3/uL (ref 150–400)
RBC: 5.15 MIL/uL (ref 4.22–5.81)
RDW: 14.9 % (ref 11.5–15.5)
WBC: 16.4 10*3/uL — ABNORMAL HIGH (ref 4.0–10.5)
nRBC: 0 % (ref 0.0–0.2)

## 2022-09-20 LAB — MAGNESIUM: Magnesium: 2.2 mg/dL (ref 1.7–2.4)

## 2022-09-20 MED ORDER — INSULIN ASPART 100 UNIT/ML IJ SOLN
8.0000 [IU] | Freq: Three times a day (TID) | INTRAMUSCULAR | Status: DC
  Administered 2022-09-20 – 2022-09-23 (×9): 8 [IU] via SUBCUTANEOUS

## 2022-09-20 MED ORDER — INSULIN GLARGINE-YFGN 100 UNIT/ML ~~LOC~~ SOLN
60.0000 [IU] | Freq: Every day | SUBCUTANEOUS | Status: DC
  Administered 2022-09-20 – 2022-09-21 (×2): 60 [IU] via SUBCUTANEOUS
  Filled 2022-09-20 (×3): qty 0.6

## 2022-09-20 MED ORDER — INSULIN ASPART 100 UNIT/ML IJ SOLN
0.0000 [IU] | Freq: Every day | INTRAMUSCULAR | Status: DC
  Administered 2022-09-20 – 2022-09-21 (×2): 5 [IU] via SUBCUTANEOUS

## 2022-09-20 MED ORDER — INSULIN ASPART 100 UNIT/ML IJ SOLN
0.0000 [IU] | Freq: Three times a day (TID) | INTRAMUSCULAR | Status: DC
  Administered 2022-09-20: 11 [IU] via SUBCUTANEOUS
  Administered 2022-09-20: 7 [IU] via SUBCUTANEOUS
  Administered 2022-09-21: 11 [IU] via SUBCUTANEOUS
  Administered 2022-09-21: 15 [IU] via SUBCUTANEOUS
  Administered 2022-09-21: 11 [IU] via SUBCUTANEOUS
  Administered 2022-09-22: 7 [IU] via SUBCUTANEOUS
  Administered 2022-09-22: 4 [IU] via SUBCUTANEOUS
  Administered 2022-09-22: 7 [IU] via SUBCUTANEOUS
  Administered 2022-09-23: 4 [IU] via SUBCUTANEOUS

## 2022-09-20 MED ORDER — FUROSEMIDE 10 MG/ML IJ SOLN
40.0000 mg | Freq: Three times a day (TID) | INTRAMUSCULAR | Status: DC
  Administered 2022-09-20 – 2022-09-23 (×11): 40 mg via INTRAVENOUS
  Filled 2022-09-20 (×11): qty 4

## 2022-09-20 NOTE — TOC Initial Note (Signed)
Transition of Care [facility]) - Initial/Assessment Note    Patient Details  Name: Vincent Ramos MRN: [id number] Date of Birth: 03-May-1951  Transition of [facility]) CM/SW Contact:    Salome Arnt, [place] Phone Number: 09/20/2022, 9:07 AM  Clinical Narrative: Pt admitted with acute respiratory failure with hypoxia. Assessment completed due to high risk readmission score. Pt reports he lives with his wife. He is independent with ADLs and drives himself to appointments. Pt ambulates with cane. He plans to return home when medically stable. TOC will continue to follow.                     Expected Discharge Plan: Home/Self Care Barriers to Discharge: Continued Medical Work up   Patient Goals and CMS Choice Patient states their goals for this hospitalization and ongoing recovery are:: return home   Choice offered to / list presented to : Patient [place] ownership interest in [facility].provided to::  (n/a)    Expected Discharge Plan and Services In-house Referral: Clinical Social Work     Living arrangements for the past 2 months: Single Family Home                                      Prior Living Arrangements/Services Living arrangements for the past 2 months: Single Family Home Lives with:: Spouse Patient language and need for interpreter reviewed:: Yes Do you feel safe going back to the place where you live?: Yes      Need for Family Participation in Patient Care: No (Comment)   Current home services: DME (cane) Criminal Activity/Legal Involvement Pertinent to Current Situation/Hospitalization: No - Comment as needed  Activities of Daily Living Home Assistive Devices/Equipment: CBG Meter ADL Screening (condition at time of admission) Patient's cognitive ability adequate to safely complete daily activities?: Yes Is the patient deaf or have difficulty hearing?: No Does the patient have difficulty seeing, even when wearing glasses/contacts?:  No Does the patient have difficulty concentrating, remembering, or making decisions?: No Patient able to express need for assistance with ADLs?: Yes Does the patient have difficulty dressing or bathing?: No Independently performs ADLs?: Yes (appropriate for developmental age) Does the patient have difficulty walking or climbing stairs?: No Weakness of Legs: None Weakness of Arms/Hands: None  Permission Sought/Granted                  Emotional Assessment     Affect (typically observed): Appropriate Orientation: : Oriented to Self, Oriented to Place, Oriented to  Time, Oriented to Situation Alcohol / Substance Use: Not Applicable Psych Involvement: No (comment)  Admission diagnosis:  Acute respiratory failure with hypoxia ([place]) [J96.01] Patient Active Problem List   Diagnosis Date Noted   Stenosis of cervical spine with myelopathy ([place]) 09/17/2020   Cervical stenosis of spine 09/17/2020   SBO (small bowel obstruction) ([place]) 05/08/2019   Constipation 02/06/2018   Abnormal nuclear cardiac imaging test 12/27/2017   Respiratory failure ([place]) 09/18/2017   COPD exacerbation ([place]) 09/17/2017   Elevated troponin 09/17/2017   Grade II diastolic dysfunction [id number]   Acute on chronic diastolic CHF (congestive heart failure) ([place]) 09/17/2017   Polyp of rectum    Encounter for screening colonoscopy 07/09/2017   Aspiration pneumonia ([place]) 06/06/2017   Dysphagia    Acute respiratory failure with hypoxia ([place]) 06/01/2017   PNA (pneumonia) 05/30/2017   Lumbar stenosis 10/07/2015   Type  2 diabetes mellitus ([place]) 07/31/2014   Hypertension 07/31/2014   Obesity 07/31/2014   Community acquired pneumonia 07/31/2014   PCP:  Sonia Side., FNP Pharmacy:   [facility], [place] - [place] [address] Phone: [phone] Fax: [phone]  [facility] - [place], [place] - [address]  [address] [place] [address] Phone: [phone] Fax: [place], [address] [place] [address] Phone: [phone] Fax: [phone]     Social Determinants of Health (SDOH) Social History: SDOH Screenings   Food Insecurity: No Food Insecurity (09/19/2022)  Utilities: Not At Risk (09/19/2022)  Tobacco Use: Low Risk  (09/19/2022)   SDOH Interventions:     Readmission Risk Interventions     No data to display

## 2022-09-20 NOTE — Progress Notes (Signed)
PROGRESS NOTE    Vincent Ramos  [id number] DOB: 07-Mar-1951 DOA: 09/19/2022 PCP: Sonia Side., FNP   Brief Narrative:    Vincent Ramos is a 72 y.o. male with medical history significant for chronic diastolic CHF, CAD, COPD, hypertension, dyslipidemia, BPH, and morbid obesity who presented to the ED with worsening respiratory distress over the past few days.  Patient was admitted with acute hypoxemic respiratory failure-multifactorial in the setting of COPD exacerbation as well as suspected acute on chronic diastolic CHF exacerbation.  Assessment & Plan:   Principal Problem:   Acute respiratory failure with hypoxia (HCC) Active Problems:   Type 2 diabetes mellitus (HCC)   Hypertension   Obesity   Lumbar stenosis   COPD exacerbation (HCC)   Acute on chronic diastolic CHF (congestive heart failure) (HCC)  Assessment and Plan:   Acute hypoxemic respiratory failure-multifactorial -Likely due to some mild volume overload and possible diastolic CHF exacerbation as well as COPD exacerbation -Continue to wean   Mild, acute COPD exacerbation -Does not have chronic hypoxemia -Improved with use of breathing treatments and IV steroids in the ED -Plan to continue scheduled DuoNebs as well as Pulmicort -Oral prednisone 40 mg daily to start 2/20, monitor carefully with type 2 diabetes and hyperglycemia   Acute on chronic diastolic CHF exacerbation -Noted to have BNP elevation as well as chest x-ray with some signs of mild volume overload -Prior echo in 09/2017 with preserved LVEF -Plan to obtain repeat 2D echocardiogram -Lasix IV 40 mg to TID dosing -Strict I's and O's and daily weights -Monitor repeat labs   History of CAD -Negative prior ischemic workup -Follows with Dr. Irish Lack outpatient   Type 2 diabetes with hyperglycemia -Carb modified diet and SSI -Hemoglobin A1c 7.3% -Consultation to diabetes coordinator, appreciate recommendations with adjustments made    Hypertension -Continue home blood pressure agents and monitor with Bystolic, diltiazem   Dyslipidemia -Continue statin   BPH -Continue tamsulosin   Morbid obesity -BMI 44.55    DVT prophylaxis:Lovenox Code Status: Full Family Communication: None at bedside Disposition Plan:  Status is: Inpatient Remains inpatient appropriate because: Need for IV medications  Consultants:  None  Procedures:  None  Antimicrobials:  None   Subjective: Patient seen and evaluated today with no new acute complaints or concerns. No acute concerns or events noted overnight.  He states that his shortness of breath is slowly improving.  He denies any chest pain.  Continues to have lower extremity edema.  Objective: Vitals:   09/20/22 0500 09/20/22 0755 09/20/22 0802 09/20/22 0827  BP:    (!) 149/66  Pulse:    75  Resp:      Temp:      TempSrc:      SpO2:  92% 99% 97%  Weight: 118.8 kg     Height:        Intake/Output Summary (Last 24 hours) at 09/20/2022 1010 Last data filed at 09/20/2022 0602 Gross per 24 hour  Intake 1103 ml  Output 500 ml  Net 603 ml   Filed Weights   09/19/22 0344 09/19/22 1000 09/20/22 0500  Weight: 125.2 kg 119.3 kg 118.8 kg    Examination:  General exam: Appears calm and comfortable  Respiratory system: Clear to auscultation. Respiratory effort normal.  Nasal cannula Cardiovascular system: S1 & S2 heard, RRR.  Gastrointestinal system: Abdomen is soft Central nervous system: Alert and awake Extremities: Bilateral 1-2+ pitting edema Skin: No significant lesions noted Psychiatry: Flat affect.  Data Reviewed: I have personally reviewed following labs and imaging studies  CBC: Recent Labs  Lab 09/19/22 0340 09/19/22 0754 09/20/22 0417  WBC 9.8 9.6 16.4*  HGB 15.2 14.5 13.6  HCT 48.3 45.4 43.6  MCV 85.2 84.9 84.7  PLT 170 161 0000000   Basic Metabolic Panel: Recent Labs  Lab 09/19/22 0340 09/19/22 0754 09/20/22 0417  NA 137  --  137  K  3.0*  --  3.9  CL 100  --  102  CO2 29  --  28  GLUCOSE 259*  --  285*  BUN 13  --  24*  CREATININE 1.07 1.05 1.27*  CALCIUM 8.6*  --  8.5*  MG  --   --  2.2   GFR: Estimated Creatinine Clearance: 66.6 mL/min (A) (by C-G formula based on SCr of 1.27 mg/dL (H)). Liver Function Tests: Recent Labs  Lab 09/19/22 0340  AST 21  ALT 24  ALKPHOS 84  BILITOT 1.4*  PROT 7.4  ALBUMIN 4.0   No results for input(s): "LIPASE", "AMYLASE" in the last 168 hours. No results for input(s): "AMMONIA" in the last 168 hours. Coagulation Profile: Recent Labs  Lab 09/19/22 0340  INR 0.9   Cardiac Enzymes: No results for input(s): "CKTOTAL", "CKMB", "CKMBINDEX", "TROPONINI" in the last 168 hours. BNP (last 3 results) No results for input(s): "PROBNP" in the last 8760 hours. HbA1C: Recent Labs    09/19/22 0754  HGBA1C 7.3*   CBG: Recent Labs  Lab 09/19/22 1643 09/19/22 2009 09/20/22 0017 09/20/22 0321 09/20/22 0723  GLUCAP 333* 330* 252* 296* 339*   Lipid Profile: No results for input(s): "CHOL", "HDL", "LDLCALC", "TRIG", "CHOLHDL", "LDLDIRECT" in the last 72 hours. Thyroid Function Tests: No results for input(s): "TSH", "T4TOTAL", "FREET4", "T3FREE", "THYROIDAB" in the last 72 hours. Anemia Panel: No results for input(s): "VITAMINB12", "FOLATE", "FERRITIN", "TIBC", "IRON", "RETICCTPCT" in the last 72 hours. Sepsis Labs: No results for input(s): "PROCALCITON", "LATICACIDVEN" in the last 168 hours.  Recent Results (from the past 240 hour(s))  Resp panel by RT-PCR (RSV, Flu A&B, Covid) Anterior Nasal Swab     Status: None   Collection Time: 09/19/22  3:45 AM   Specimen: Anterior Nasal Swab  Result Value Ref Range Status   SARS Coronavirus 2 by RT PCR NEGATIVE NEGATIVE Final    Comment: (NOTE) SARS-CoV-2 target nucleic acids are NOT DETECTED.  The SARS-CoV-2 RNA is generally detectable in upper respiratory specimens during the acute phase of infection. The lowest concentration  of SARS-CoV-2 viral copies this assay can detect is 138 copies/mL. A negative result does not preclude SARS-Cov-2 infection and should not be used as the sole basis for treatment or other patient management decisions. A negative result may occur with  improper specimen collection/handling, submission of specimen other than nasopharyngeal swab, presence of viral mutation(s) within the areas targeted by this assay, and inadequate number of viral copies(<138 copies/mL). A negative result must be combined with clinical observations, patient history, and epidemiological information. The expected result is Negative.  Fact Sheet for Patients:  EntrepreneurPulse.com.au  Fact Sheet for Healthcare Providers:  IncredibleEmployment.be  This test is no t yet approved or cleared by the [place] FDA and  has been authorized for detection and/or diagnosis of SARS-CoV-2 by FDA under an Emergency Use Authorization (EUA). This EUA will remain  in effect (meaning this test can be used) for the duration of the COVID-19 declaration under Section 564(b)(1) of the Act, 21 U.S.C.section 360bbb-3(b)(1), unless the authorization is  terminated  or revoked sooner.       Influenza A by PCR NEGATIVE NEGATIVE Final   Influenza B by PCR NEGATIVE NEGATIVE Final    Comment: (NOTE) The Xpert Xpress SARS-CoV-2/FLU/RSV plus assay is intended as an aid in the diagnosis of influenza from Nasopharyngeal swab specimens and should not be used as a sole basis for treatment. Nasal washings and aspirates are unacceptable for Xpert Xpress SARS-CoV-2/FLU/RSV testing.  Fact Sheet for Patients: EntrepreneurPulse.com.au  Fact Sheet for Healthcare Providers: IncredibleEmployment.be  This test is not yet approved or cleared by the [place] FDA and has been authorized for detection and/or diagnosis of SARS-CoV-2 by FDA under an Emergency Use  Authorization (EUA). This EUA will remain in effect (meaning this test can be used) for the duration of the COVID-19 declaration under Section 564(b)(1) of the Act, 21 U.S.C. section 360bbb-3(b)(1), unless the authorization is terminated or revoked.     Resp Syncytial Virus by PCR NEGATIVE NEGATIVE Final    Comment: (NOTE) Fact Sheet for Patients: EntrepreneurPulse.com.au  Fact Sheet for Healthcare Providers: IncredibleEmployment.be  This test is not yet approved or cleared by the [place] FDA and has been authorized for detection and/or diagnosis of SARS-CoV-2 by FDA under an Emergency Use Authorization (EUA). This EUA will remain in effect (meaning this test can be used) for the duration of the COVID-19 declaration under Section 564(b)(1) of the Act, 21 U.S.C. section 360bbb-3(b)(1), unless the authorization is terminated or revoked.  Performed at [facility], [address] [place], [place] [address]          Radiology Studies: [facility] 1 View  Result Date: 09/19/2022 CLINICAL DATA:  Shortness of breath. EXAM: PORTABLE CHEST 1 VIEW COMPARISON:  02/03/2018. FINDINGS: The heart is borderline enlarged and mediastinal contours are within normal limits. There is atherosclerotic calcification of the aorta. The pulmonary vasculature is mildly distended. There is chronic elevation of the right diaphragm. Mild airspace disease is noted at the left lung base. There is a small left pleural effusion. No pneumothorax. Cervical spinal fusion hardware is noted. IMPRESSION: 1. Borderline cardiomegaly with pulmonary vascular congestion. 2. Small left pleural effusion with atelectasis, edema, or infiltrate at the left lung base. Electronically Signed   By: Brett Fairy M.D.   On: 09/19/2022 04:08        Scheduled Meds:  aspirin EC  81 mg Oral Q breakfast   budesonide (PULMICORT) nebulizer solution  0.25 mg Nebulization BID   enoxaparin  (LOVENOX) injection  40 mg Subcutaneous Q24H   fluticasone  2 spray Each Nare Daily   furosemide  40 mg Intravenous TID   gabapentin  100 mg Oral TID   hydrALAZINE  100 mg Oral TID   insulin aspart  0-20 Units Subcutaneous TID WC   insulin aspart  0-5 Units Subcutaneous QHS   insulin aspart  8 Units Subcutaneous TID WC   insulin glargine-yfgn  60 Units Subcutaneous Daily   ipratropium-albuterol  3 mL Nebulization BID   isosorbide mononitrate  30 mg Oral Daily   montelukast  10 mg Oral Daily   nebivolol  40 mg Oral Daily   pantoprazole  40 mg Oral Daily   predniSONE  40 mg Oral Q breakfast   rosuvastatin  40 mg Oral Daily   sodium chloride flush  3 mL Intravenous Q12H   tamsulosin  0.4 mg Oral Daily   Continuous Infusions:  sodium chloride       LOS: 1 day  Time spent: 35 minutes    Dalin Caldera Darleen Crocker, DO [facility]  If 7PM-7AM, please contact night-coverage [email or web address] 09/20/2022, 10:10 AM

## 2022-09-20 NOTE — Progress Notes (Signed)
Mobility Specialist Progress Note:    09/20/22 1116  Mobility  Activity Ambulated with assistance in hallway  Level of Assistance Standby assist, set-up cues, supervision of patient - no hands on  Assistive Device None  Distance Ambulated (ft) 300 ft  Activity Response Tolerated well  Mobility Referral Yes  $Mobility charge 1 Mobility   Pt received ambulating independently in room, requested to use urinal in restroom for privacy. Agreeable to mobility session. Tolerated well, asx throughout. SpO2 83% on 2L during ambulation, pt recovered, SpO2 rose to 94% on 2L. Returned pt to room, Left in chair call bell in reach, all needs met.  Royetta Crochet Mobility Specialist Please contact via Solicitor or  Rehab office at [phone]

## 2022-09-20 NOTE — Inpatient Diabetes Management (Signed)
Inpatient Diabetes Program Recommendations  AACE/ADA: New Consensus Statement on Inpatient Glycemic Control (2015)  Target Ranges:  Prepandial:   less than 140 mg/dL      Peak postprandial:   less than 180 mg/dL (1-2 hours)      Critically ill patients:  140 - 180 mg/dL   Lab Results  Component Value Date   GLUCAP 339 (H) 09/20/2022   HGBA1C 7.3 (H) 09/19/2022    Review of Glycemic Control  Latest Reference Range & Units 09/19/22 16:43 09/19/22 20:09 09/20/22 00:17 09/20/22 03:21 09/20/22 07:23  Glucose-Capillary 70 - 99 mg/dL 333 (H) 330 (H) 252 (H) 296 (H) 339 (H)  (H): Data is abnormally high Diabetes history: Type 2 DM Outpatient Diabetes medications: Trulicity 1.5 mg qwk, Tresiba 80 units QD, Januvia 100 mg QD, Glipizide 10 mg BID Current orders for Inpatient glycemic control: Novolog 0-20 units Q4H, Semglee 40 units QD Prednisone 40 mg QA    Inpatient Diabetes Program Recommendations:     Consider: -Changing correction to Novolog 0-20 units TID & HS -Adding Novolog 8 units TID (assuming patient consuming >50% of meals) -Increasing Semglee to 60 units QD.  Thanks, Bronson Curb, MSN, RNC-OB Diabetes Coordinator [phone] (8a-5p)

## 2022-09-20 NOTE — Progress Notes (Addendum)
*  PRELIMINARY RESULTS* Echocardiogram 2D Echocardiogram has been performed.  Vincent Ramos 09/20/2022, 7:06 PM

## 2022-09-20 NOTE — Progress Notes (Signed)
Bipap order is PRN, not needed at this time.  Will continue to monitor.

## 2022-09-20 NOTE — Progress Notes (Signed)
Pt hypertensive, other vitals stable. CBGs elevated during the night, scheduled insulin given as ordered. No c/o pain noted.

## 2022-09-21 DIAGNOSIS — J9601 Acute respiratory failure with hypoxia: Secondary | ICD-10-CM | POA: Diagnosis not present

## 2022-09-21 LAB — MAGNESIUM: Magnesium: 2.3 mg/dL (ref 1.7–2.4)

## 2022-09-21 LAB — BASIC METABOLIC PANEL
Anion gap: 9 (ref 5–15)
BUN: 24 mg/dL — ABNORMAL HIGH (ref 8–23)
CO2: 30 mmol/L (ref 22–32)
Calcium: 8.3 mg/dL — ABNORMAL LOW (ref 8.9–10.3)
Chloride: 98 mmol/L (ref 98–111)
Creatinine, Ser: 1.11 mg/dL (ref 0.61–1.24)
GFR, Estimated: >60 mL/min (ref >60)
Glucose, Bld: 292 mg/dL — ABNORMAL HIGH (ref 70–99)
Potassium: 3.3 mmol/L — ABNORMAL LOW (ref 3.5–5.1)
Sodium: 137 mmol/L (ref 135–145)

## 2022-09-21 LAB — CBC
HCT: 44.2 % (ref 39.0–52.0)
Hemoglobin: 13.9 g/dL (ref 13.0–17.0)
MCH: 26.6 pg (ref 26.0–34.0)
MCHC: 31.4 g/dL (ref 30.0–36.0)
MCV: 84.5 fL (ref 80.0–100.0)
Platelets: 159 10*3/uL (ref 150–400)
RBC: 5.23 MIL/uL (ref 4.22–5.81)
RDW: 15 % (ref 11.5–15.5)
WBC: 15 10*3/uL — ABNORMAL HIGH (ref 4.0–10.5)
nRBC: 0 % (ref 0.0–0.2)

## 2022-09-21 LAB — GLUCOSE, CAPILLARY
Glucose-Capillary: 287 mg/dL — ABNORMAL HIGH (ref 70–99)
Glucose-Capillary: 317 mg/dL — ABNORMAL HIGH (ref 70–99)
Glucose-Capillary: 261 mg/dL — ABNORMAL HIGH (ref 70–99)
Glucose-Capillary: 277 mg/dL — ABNORMAL HIGH (ref 70–99)
Glucose-Capillary: 376 mg/dL — ABNORMAL HIGH (ref 70–99)

## 2022-09-21 MED ORDER — INSULIN GLARGINE-YFGN 100 UNIT/ML ~~LOC~~ SOLN
80.0000 [IU] | Freq: Every day | SUBCUTANEOUS | Status: DC
  Administered 2022-09-22 – 2022-09-23 (×2): 80 [IU] via SUBCUTANEOUS
  Filled 2022-09-21 (×3): qty 0.8

## 2022-09-21 MED ORDER — INSULIN ASPART 100 UNIT/ML IJ SOLN
5.0000 [IU] | Freq: Once | INTRAMUSCULAR | Status: AC
  Administered 2022-09-21: 5 [IU] via SUBCUTANEOUS

## 2022-09-21 MED ORDER — POTASSIUM CHLORIDE CRYS ER 20 MEQ PO TBCR
40.0000 meq | EXTENDED_RELEASE_TABLET | Freq: Two times a day (BID) | ORAL | Status: AC
  Administered 2022-09-21 (×2): 40 meq via ORAL
  Filled 2022-09-21 (×2): qty 2

## 2022-09-21 MED ORDER — INSULIN GLARGINE-YFGN 100 UNIT/ML ~~LOC~~ SOLN
20.0000 [IU] | Freq: Once | SUBCUTANEOUS | Status: AC
  Administered 2022-09-21: 20 [IU] via SUBCUTANEOUS
  Filled 2022-09-21: qty 0.2

## 2022-09-21 MED ORDER — SIMETHICONE 80 MG PO CHEW
80.0000 mg | CHEWABLE_TABLET | Freq: Four times a day (QID) | ORAL | Status: DC | PRN
  Administered 2022-09-21 – 2022-09-23 (×2): 80 mg via ORAL
  Filled 2022-09-21 (×2): qty 1

## 2022-09-21 NOTE — Progress Notes (Signed)
Mobility Specialist Progress Note:    09/21/22 1330  Mobility  Activity Ambulated with assistance in hallway  Level of Assistance Independent  Assistive Device None  Distance Ambulated (ft) 300 ft  Activity Response Tolerated well  Mobility Referral Yes  $Mobility charge 1 Mobility   Pt was agreeable for mobility session. Tolerated well, asx throughout. Baseline SpO2 93% on 2L. During ambulation SpO2 89% on 2L. Returned to room, pt recovered SpO2 95% on 2L. Left pt EOB with all needs met.   Royetta Crochet Mobility Specialist Please contact via Solicitor or  Rehab office at [phone]

## 2022-09-21 NOTE — Progress Notes (Signed)
Pt's blood glucose has been elevated through the night. MD made aware, 5 units of Novolog ordered 2x during the night. BP elevated, other vitals stable.

## 2022-09-21 NOTE — Progress Notes (Signed)
Bipap is PRN order, not needed at this time.

## 2022-09-21 NOTE — Inpatient Diabetes Management (Signed)
Inpatient Diabetes Program Recommendations  AACE/ADA: New Consensus Statement on Inpatient Glycemic Control  Target Ranges:  Prepandial:   less than 140 mg/dL      Peak postprandial:   less than 180 mg/dL (1-2 hours)      Critically ill patients:  140 - 180 mg/dL    Latest Reference Range & Units 09/20/22 07:23 09/20/22 11:41 09/20/22 17:24 09/20/22 20:08 09/20/22 23:39 09/21/22 03:29 09/21/22 07:29  Glucose-Capillary 70 - 99 mg/dL 339 (H) 284 (H) 264 (H) 377 (H) 371 (H) 287 (H) 317 (H)   Review of Glycemic Control  Diabetes history: Type 2 DM Outpatient Diabetes medications: Trulicity 1.5 mg qwk, Tresiba 80 units QD, Januvia 100 mg QD, Glipizide 10 mg BID Current orders for Inpatient glycemic control: Semglee 60 units daily, Novolog 8 units TID with meals, Novolog 0-20 units TID with meals, Novolog 0-5 units QHS; Prednisone 40 mg QAM  Inpatient Diabetes Program Recommendations:    Insulin: Please consider increasing Semglee to 80 units daily to start 09/22/22. If agreeable please also order one time Semglee 20 units x1 now since already given 60 units this morning. Also, consider increasing meal coverage to Novolog 12 units TID with meals.  Thanks, Barnie Alderman, RN, MSN, [place] Diabetes Coordinator Inpatient Diabetes Program [phone] (Team Pager from 8am to [place])

## 2022-09-21 NOTE — Progress Notes (Signed)
TR[facility] night cross cover note:   I was notified by RN that the patient is c/o "gas pains". I subsequently placed order for prn simethicone.   Babs Bertin, DO Hospitalist

## 2022-09-21 NOTE — Progress Notes (Signed)
PROGRESS NOTE    Vincent Ramos  [id number] DOB: 01/11/1951 DOA: 09/19/2022 PCP: Sonia Side., FNP   Brief Narrative:    Vincent Ramos is a 72 y.o. male with medical history significant for chronic diastolic CHF, CAD, COPD, hypertension, dyslipidemia, BPH, and morbid obesity who presented to the ED with worsening respiratory distress over the past few days.  Patient was admitted with acute hypoxemic respiratory failure-multifactorial in the setting of COPD exacerbation as well as suspected acute on chronic diastolic CHF exacerbation.  2D echocardiogram 2/20 with demonstrated grade 2 diastolic dysfunction and LVEF 70-75%.  Assessment & Plan:   Principal Problem:   Acute respiratory failure with hypoxia (HCC) Active Problems:   Type 2 diabetes mellitus (HCC)   Hypertension   Obesity   Lumbar stenosis   COPD exacerbation (HCC)   Acute on chronic diastolic CHF (congestive heart failure) (HCC)  Assessment and Plan:   Acute hypoxemic respiratory failure-multifactorial -Likely due to some mild volume overload and possible diastolic CHF exacerbation as well as COPD exacerbation -Continue to wean   Mild, acute COPD exacerbation -Does not have chronic hypoxemia -Improved with use of breathing treatments and IV steroids in the ED -Plan to continue scheduled DuoNebs as well as Pulmicort -Oral prednisone 40 mg daily to start 2/20, monitor carefully with type 2 diabetes and hyperglycemia   Acute on chronic diastolic CHF exacerbation -Noted to have BNP elevation as well as chest x-ray with some signs of mild volume overload -Prior echo in 09/2017 with preserved LVEF -Plan to obtain repeat 2D echocardiogram -Lasix IV 40 mg to TID dosing -Strict I's and O's and daily weights -Monitor repeat labs -He appears to be diuresing well with over 2 L in the last 24 hours diuresed and weight is downward trending  Mild hypokalemia -Replete and reevaluate in a.m.   History of  CAD -Negative prior ischemic workup -Follows with Dr. Irish Lack outpatient   Type 2 diabetes with hyperglycemia -Carb modified diet and SSI -Hemoglobin A1c 7.3% -Consultation to diabetes coordinator, appreciate recommendations with adjustments made to [facility] to 80 units daily   Hypertension -Continue home blood pressure agents and monitor with Bystolic, diltiazem   Dyslipidemia -Continue statin   BPH -Continue tamsulosin   Morbid obesity -BMI 44.55    DVT prophylaxis:Lovenox Code Status: Full Family Communication: None at bedside Disposition Plan:  Status is: Inpatient Remains inpatient appropriate because: Need for IV medications  Consultants:  None  Procedures:  None  Antimicrobials:  None   Subjective: Patient seen and evaluated today with no new acute complaints or concerns. No acute concerns or events noted overnight.  He states that his shortness of breath is slowly improving.  He denies any chest pain.  Continues to have lower extremity edema.  Objective: Vitals:   09/21/22 0326 09/21/22 0500 09/21/22 0810 09/21/22 0812  BP: (!) 158/67     Pulse: 65     Resp: 16     Temp: 97.7 F (36.5 C)     TempSrc:      SpO2: 97%  91% 99%  Weight:  117.8 kg    Height:        Intake/Output Summary (Last 24 hours) at 09/21/2022 1018 Last data filed at 09/21/2022 [id number] Gross per 24 hour  Intake 1600 ml  Output 3850 ml  Net -2250 ml   Filed Weights   09/19/22 1000 09/20/22 0500 09/21/22 0500  Weight: 119.3 kg 118.8 kg 117.8 kg    Examination:  General  exam: Appears calm and comfortable  Respiratory system: Clear to auscultation. Respiratory effort normal.  Nasal cannula 2L Cardiovascular system: S1 & S2 heard, RRR.  Gastrointestinal system: Abdomen is soft Central nervous system: Alert and awake Extremities: Bilateral 1-2+ pitting edema Skin: No significant lesions noted Psychiatry: Flat affect.    Data Reviewed: I have personally reviewed following  labs and imaging studies  CBC: Recent Labs  Lab 09/19/22 0340 09/19/22 0754 09/20/22 0417 09/21/22 0405  WBC 9.8 9.6 16.4* 15.0*  HGB 15.2 14.5 13.6 13.9  HCT 48.3 45.4 43.6 44.2  MCV 85.2 84.9 84.7 84.5  PLT 170 161 152 [id number]   Basic Metabolic Panel: Recent Labs  Lab 09/19/22 0340 09/19/22 0754 09/20/22 0417 09/21/22 0405  NA 137  --  137 137  K 3.0*  --  3.9 3.3*  CL 100  --  102 98  CO2 29  --  28 30  GLUCOSE 259*  --  285* 292*  BUN 13  --  24* 24*  CREATININE 1.07 1.05 1.27* 1.11  CALCIUM 8.6*  --  8.5* 8.3*  MG  --   --  2.2 2.3   GFR: Estimated Creatinine Clearance: 75.9 mL/min (by C-G formula based on SCr of 1.11 mg/dL). Liver Function Tests: Recent Labs  Lab 09/19/22 0340  AST 21  ALT 24  ALKPHOS 84  BILITOT 1.4*  PROT 7.4  ALBUMIN 4.0   No results for input(s): "LIPASE", "AMYLASE" in the last 168 hours. No results for input(s): "AMMONIA" in the last 168 hours. Coagulation Profile: Recent Labs  Lab 09/19/22 0340  INR 0.9   Cardiac Enzymes: No results for input(s): "CKTOTAL", "CKMB", "CKMBINDEX", "TROPONINI" in the last 168 hours. BNP (last 3 results) No results for input(s): "PROBNP" in the last 8760 hours. HbA1C: Recent Labs    09/19/22 0754  HGBA1C 7.3*   CBG: Recent Labs  Lab 09/20/22 1724 09/20/22 2008 09/20/22 2339 09/21/22 0329 09/21/22 0729  GLUCAP 264* 377* 371* 287* 317*   Lipid Profile: No results for input(s): "CHOL", "HDL", "LDLCALC", "TRIG", "CHOLHDL", "LDLDIRECT" in the last 72 hours. Thyroid Function Tests: No results for input(s): "TSH", "T4TOTAL", "FREET4", "T3FREE", "THYROIDAB" in the last 72 hours. Anemia Panel: No results for input(s): "VITAMINB12", "FOLATE", "FERRITIN", "TIBC", "IRON", "RETICCTPCT" in the last 72 hours. Sepsis Labs: No results for input(s): "PROCALCITON", "LATICACIDVEN" in the last 168 hours.  Recent Results (from the past 240 hour(s))  Resp panel by RT-PCR (RSV, Flu A&B, Covid) Anterior  Nasal Swab     Status: None   Collection Time: 09/19/22  3:45 AM   Specimen: Anterior Nasal Swab  Result Value Ref Range Status   SARS Coronavirus 2 by RT PCR NEGATIVE NEGATIVE Final    Comment: (NOTE) SARS-CoV-2 target nucleic acids are NOT DETECTED.  The SARS-CoV-2 RNA is generally detectable in upper respiratory specimens during the acute phase of infection. The lowest concentration of SARS-CoV-2 viral copies this assay can detect is 138 copies/mL. A negative result does not preclude SARS-Cov-2 infection and should not be used as the sole basis for treatment or other patient management decisions. A negative result may occur with  improper specimen collection/handling, submission of specimen other than nasopharyngeal swab, presence of viral mutation(s) within the areas targeted by this assay, and inadequate number of viral copies(<138 copies/mL). A negative result must be combined with clinical observations, patient history, and epidemiological information. The expected result is Negative.  Fact Sheet for Patients:  EntrepreneurPulse.com.au  Fact Sheet for Healthcare Providers:  IncredibleEmployment.be  This test is no t yet approved or cleared by the [place] and  has been authorized for detection and/or diagnosis of SARS-CoV-2 by FDA under an Emergency Use Authorization (EUA). This EUA will remain  in effect (meaning this test can be used) for the duration of the COVID-19 declaration under Section 564(b)(1) of the Act, 21 U.S.C.section 360bbb-3(b)(1), unless the authorization is terminated  or revoked sooner.       Influenza A by PCR NEGATIVE NEGATIVE Final   Influenza B by PCR NEGATIVE NEGATIVE Final    Comment: (NOTE) The Xpert Xpress SARS-CoV-2/FLU/RSV plus assay is intended as an aid in the diagnosis of influenza from Nasopharyngeal swab specimens and should not be used as a sole basis for treatment. Nasal washings  and aspirates are unacceptable for Xpert Xpress SARS-CoV-2/FLU/RSV testing.  Fact Sheet for Patients: EntrepreneurPulse.com.au  Fact Sheet for Healthcare Providers: IncredibleEmployment.be  This test is not yet approved or cleared by the [place] FDA and has been authorized for detection and/or diagnosis of SARS-CoV-2 by FDA under an Emergency Use Authorization (EUA). This EUA will remain in effect (meaning this test can be used) for the duration of the COVID-19 declaration under Section 564(b)(1) of the Act, 21 U.S.C. section 360bbb-3(b)(1), unless the authorization is terminated or revoked.     Resp Syncytial Virus by PCR NEGATIVE NEGATIVE Final    Comment: (NOTE) Fact Sheet for Patients: EntrepreneurPulse.com.au  Fact Sheet for Healthcare Providers: IncredibleEmployment.be  This test is not yet approved or cleared by the [place] FDA and has been authorized for detection and/or diagnosis of SARS-CoV-2 by FDA under an Emergency Use Authorization (EUA). This EUA will remain in effect (meaning this test can be used) for the duration of the COVID-19 declaration under Section 564(b)(1) of the Act, 21 U.S.C. section 360bbb-3(b)(1), unless the authorization is terminated or revoked.  Performed at [facility], [address], [place], [place] [address]          Radiology Studies: ECHOCARDIOGRAM COMPLETE  Result Date: 09/20/2022    ECHOCARDIOGRAM REPORT   Patient Name:   CHADDRICK FORBES Date of Exam: 09/20/2022 Medical Rec #:  [id number]        Height:       66.0 in Accession #:    [id number]       Weight:       261.9 lb Date of Birth:  1951-07-03         BSA:          2.243 m Patient Age:    60 years         BP:           149/66 mmHg Patient Gender: M                HR:           75 bpm. Exam Location:  [facility] Procedure: 2D Echo, Cardiac Doppler and Color Doppler Indications:    CHF-Acute  Diastolic [id number]  History:        Patient has prior history of Echocardiogram examinations, most                 recent 09/18/2017. COPD; Risk Factors:Hypertension and Diabetes.  Sonographer:    Alvino Chapel RCS Referring Phys: [id number] [place]  1. Left ventricular ejection fraction, by estimation, is 70 to 75%. The left ventricle has hyperdynamic function. The left ventricle has no regional wall motion abnormalities. There is severe  concentric left ventricular hypertrophy. Left ventricular diastolic parameters are consistent with Grade II diastolic dysfunction (pseudonormalization).  2. Right ventricular systolic function is normal. The right ventricular size is mildly enlarged. Moderately increased right ventricular wall thickness. Tricuspid regurgitation signal is inadequate for assessing PA pressure.  3. Left atrial size was moderately dilated.  4. Right atrial size was moderately dilated.  5. The mitral valve is normal in structure. No evidence of mitral valve regurgitation. No evidence of mitral stenosis.  6. The aortic valve is tricuspid. There is mild calcification of the aortic valve. Aortic valve regurgitation is not visualized. No aortic stenosis is present.  7. The inferior vena cava is dilated in size with >50% respiratory variability, suggesting right atrial pressure of 8 mmHg.  8. Increased flow velocities may be secondary to anemia, thyrotoxicosis, hyperdynamic or high flow state. Comparison(s): No significant change from prior study. FINDINGS  Left Ventricle: Left ventricular ejection fraction, by estimation, is 70 to 75%. The left ventricle has hyperdynamic function. The left ventricle has no regional wall motion abnormalities. The left ventricular internal cavity size was normal in size. There is severe concentric left ventricular hypertrophy. Left ventricular diastolic parameters are consistent with Grade II diastolic dysfunction (pseudonormalization). Right Ventricle: The right  ventricular size is mildly enlarged. Moderately increased right ventricular wall thickness. Right ventricular systolic function is normal. Tricuspid regurgitation signal is inadequate for assessing PA pressure. Left Atrium: Left atrial size was moderately dilated. Right Atrium: Right atrial size was moderately dilated. Pericardium: There is no evidence of pericardial effusion. Mitral Valve: The mitral valve is normal in structure. No evidence of mitral valve regurgitation. No evidence of mitral valve stenosis. Tricuspid Valve: The tricuspid valve is normal in structure. Tricuspid valve regurgitation is not demonstrated. No evidence of tricuspid stenosis. Aortic Valve: The aortic valve is tricuspid. There is mild calcification of the aortic valve. Aortic valve regurgitation is not visualized. No aortic stenosis is present. Aortic valve mean gradient measures 8.0 mmHg. Aortic valve peak gradient measures 15.1 mmHg. Aortic valve area, by VTI measures 2.55 cm. Pulmonic Valve: The pulmonic valve was not well visualized. Pulmonic valve regurgitation is not visualized. No evidence of pulmonic stenosis. Aorta: The aortic root is normal in size and structure. Venous: The inferior vena cava is dilated in size with greater than 50% respiratory variability, suggesting right atrial pressure of 8 mmHg. IAS/Shunts: No atrial level shunt detected by color flow Doppler.  LEFT VENTRICLE PLAX 2D LVIDd:         5.40 cm   Diastology LVIDs:         2.80 cm   LV e' medial:    5.55 cm/s LV PW:         1.70 cm   LV E/e' medial:  20.2 LV IVS:        1.70 cm   LV e' lateral:   9.14 cm/s LVOT diam:     2.00 cm   LV E/e' lateral: 12.3 LV SV:         96 LV SV Index:   43 LVOT Area:     3.14 cm  RIGHT VENTRICLE RV S prime:     18.30 cm/s TAPSE (M-mode): 2.7 cm LEFT ATRIUM             Index        RIGHT ATRIUM           Index LA diam:        3.60 cm 1.61 cm/m  RA Area:     22.70 cm LA Vol (A2C):   92.7 ml 41.33 ml/m  RA Volume:   78.90 ml   35.18 ml/m LA Vol (A4C):   85.4 ml 38.08 ml/m LA Biplane Vol: 90.9 ml 40.53 ml/m  AORTIC VALVE AV Area (Vmax):    2.36 cm AV Area (Vmean):   2.62 cm AV Area (VTI):     2.55 cm AV Vmax:           194.00 cm/s AV Vmean:          127.000 cm/s AV VTI:            0.375 m AV Peak Grad:      15.1 mmHg AV Mean Grad:      8.0 mmHg LVOT Vmax:         146.00 cm/s LVOT Vmean:        106.000 cm/s LVOT VTI:          0.304 m LVOT/AV VTI ratio: 0.81  AORTA Ao Root diam: 3.80 cm MITRAL VALVE MV Area (PHT): 3.48 cm     SHUNTS MV Decel Time: 218 msec     Systemic VTI:  0.30 m MV E velocity: 112.00 cm/s  Systemic Diam: 2.00 cm MV A velocity: 72.40 cm/s MV E/A ratio:  1.55 Vishnu Priya Mallipeddi Electronically signed by Lorelee Cover Mallipeddi Signature Date/Time: 09/20/2022/8:01:35 PM    Final         Scheduled Meds:  aspirin EC  81 mg Oral Q breakfast   budesonide (PULMICORT) nebulizer solution  0.25 mg Nebulization BID   enoxaparin (LOVENOX) injection  40 mg Subcutaneous Q24H   fluticasone  2 spray Each Nare Daily   furosemide  40 mg Intravenous TID   gabapentin  100 mg Oral TID   hydrALAZINE  100 mg Oral TID   insulin aspart  0-20 Units Subcutaneous TID WC   insulin aspart  0-5 Units Subcutaneous QHS   insulin aspart  8 Units Subcutaneous TID WC   insulin glargine-yfgn  20 Units Subcutaneous Once   [START ON 09/22/2022] insulin glargine-yfgn  80 Units Subcutaneous Daily   ipratropium-albuterol  3 mL Nebulization BID   isosorbide mononitrate  30 mg Oral Daily   montelukast  10 mg Oral Daily   nebivolol  40 mg Oral Daily   pantoprazole  40 mg Oral Daily   potassium chloride  40 mEq Oral BID   predniSONE  40 mg Oral Q breakfast   rosuvastatin  40 mg Oral Daily   sodium chloride flush  3 mL Intravenous Q12H   tamsulosin  0.4 mg Oral Daily   Continuous Infusions:  sodium chloride       LOS: 2 days    Time spent: 35 minutes    Dustin Bumbaugh Darleen Crocker, DO [facility]  If 7PM-7AM, please contact  night-coverage [email or web address] 09/21/2022, 10:18 AM

## 2022-09-22 DIAGNOSIS — J9601 Acute respiratory failure with hypoxia: Secondary | ICD-10-CM | POA: Diagnosis not present

## 2022-09-22 LAB — BASIC METABOLIC PANEL
Anion gap: 7 (ref 5–15)
BUN: 23 mg/dL (ref 8–23)
CO2: 33 mmol/L — ABNORMAL HIGH (ref 22–32)
Calcium: 8.1 mg/dL — ABNORMAL LOW (ref 8.9–10.3)
Chloride: 97 mmol/L — ABNORMAL LOW (ref 98–111)
Creatinine, Ser: 1.11 mg/dL (ref 0.61–1.24)
GFR, Estimated: >60 mL/min (ref >60)
Glucose, Bld: 274 mg/dL — ABNORMAL HIGH (ref 70–99)
Potassium: 3.7 mmol/L (ref 3.5–5.1)
Sodium: 137 mmol/L (ref 135–145)

## 2022-09-22 LAB — CBC
HCT: 43.9 % (ref 39.0–52.0)
Hemoglobin: 14.1 g/dL (ref 13.0–17.0)
MCH: 26.9 pg (ref 26.0–34.0)
MCHC: 32.1 g/dL (ref 30.0–36.0)
MCV: 83.8 fL (ref 80.0–100.0)
Platelets: 148 10*3/uL — ABNORMAL LOW (ref 150–400)
RBC: 5.24 MIL/uL (ref 4.22–5.81)
RDW: 14.9 % (ref 11.5–15.5)
WBC: 11 10*3/uL — ABNORMAL HIGH (ref 4.0–10.5)
nRBC: 0 % (ref 0.0–0.2)

## 2022-09-22 LAB — GLUCOSE, CAPILLARY
Glucose-Capillary: 225 mg/dL — ABNORMAL HIGH (ref 70–99)
Glucose-Capillary: 194 mg/dL — ABNORMAL HIGH (ref 70–99)
Glucose-Capillary: 208 mg/dL — ABNORMAL HIGH (ref 70–99)
Glucose-Capillary: 105 mg/dL — ABNORMAL HIGH (ref 70–99)

## 2022-09-22 LAB — MAGNESIUM: Magnesium: 2.3 mg/dL (ref 1.7–2.4)

## 2022-09-22 MED ORDER — HYDRALAZINE HCL 20 MG/ML IJ SOLN: 10.0000 mg | INTRAMUSCULAR | Status: DC | PRN

## 2022-09-22 NOTE — Progress Notes (Signed)
PROGRESS NOTE    Vincent Ramos  [id number] DOB: 12/24/1950 DOA: 09/19/2022 PCP: Sonia Side., FNP   Brief Narrative:    Vincent Ramos is a 72 y.o. male with medical history significant for chronic diastolic CHF, CAD, COPD, hypertension, dyslipidemia, BPH, and morbid obesity who presented to the ED with worsening respiratory distress over the past few days.  Patient was admitted with acute hypoxemic respiratory failure-multifactorial in the setting of COPD exacerbation as well as suspected acute on chronic diastolic CHF exacerbation.  2D echocardiogram 2/20 with demonstrated grade 2 diastolic dysfunction and LVEF 70-75%.  Assessment & Plan:   Principal Problem:   Acute respiratory failure with hypoxia (HCC) Active Problems:   Type 2 diabetes mellitus (HCC)   Hypertension   Obesity   Lumbar stenosis   COPD exacerbation (HCC)   Acute on chronic diastolic CHF (congestive heart failure) (HCC)  Assessment and Plan:   Acute hypoxemic respiratory failure-multifactorial -Likely due to some mild volume overload and possible diastolic CHF exacerbation as well as COPD exacerbation -Continue to wean   Mild, acute COPD exacerbation -Does not have chronic hypoxemia -Improved with use of breathing treatments and IV steroids in the ED -Plan to continue scheduled DuoNebs as well as Pulmicort -Discontinue further use of prednisone   Acute on chronic diastolic CHF exacerbation -Noted to have BNP elevation as well as chest x-ray with some signs of mild volume overload -2D echocardiogram with grade 2 diastolic dysfunction and LVEF 70-75% -Lasix IV 40 mg 3 times daily dosing to continue -Strict I's and O's and daily weights -Monitor repeat labs -He appears to be diuresing well with over 2 L in the last 24 hours diuresed and weight is downward trending, encouraged limitation of fluid intake -Plan to likely discharge on torsemide 40 mg daily with other usual home medications    History of CAD -Negative prior ischemic workup -Follows with Dr. Irish Lack outpatient   Type 2 diabetes with hyperglycemia -Carb modified diet and SSI -Hemoglobin A1c 7.3% -Consultation to diabetes coordinator, appreciate recommendations with adjustments made to [facility] to 80 units daily   Hypertension -Continue home blood pressure agents and monitor with Bystolic, diltiazem   Dyslipidemia -Continue statin   BPH -Continue tamsulosin   Morbid obesity -BMI 44.55    DVT prophylaxis:Lovenox Code Status: Full Family Communication: None at bedside Disposition Plan:  Status is: Inpatient Remains inpatient appropriate because: Need for IV medications  Consultants:  None  Procedures:  None  Antimicrobials:  None   Subjective: Patient seen and evaluated today with no new acute complaints or concerns. No acute concerns or events noted overnight.  He states that his shortness of breath is slowly improving.  He denies any chest pain.  Continues to have lower extremity edema.  Objective: Vitals:   09/21/22 2109 09/22/22 0621 09/22/22 0751 09/22/22 0754  BP: (!) 180/68 (!) 184/76    Pulse: 70 (!) 53    Resp: 19 16    Temp: 98.3 F (36.8 C) 98.1 F (36.7 C)    TempSrc:  Oral    SpO2: 95% 99% 92% 98%  Weight:  114.8 kg    Height:        Intake/Output Summary (Last 24 hours) at 09/22/2022 1037 Last data filed at 09/22/2022 0733 Gross per 24 hour  Intake 1440 ml  Output 3100 ml  Net -1660 ml   Filed Weights   09/20/22 0500 09/21/22 0500 09/22/22 0621  Weight: 118.8 kg 117.8 kg 114.8 kg  Examination:  General exam: Appears calm and comfortable  Respiratory system: Clear to auscultation. Respiratory effort normal.  Nasal cannula 2L Cardiovascular system: S1 & S2 heard, RRR.  Gastrointestinal system: Abdomen is soft Central nervous system: Alert and awake Extremities: Bilateral 1-2+ pitting edema Skin: No significant lesions noted Psychiatry: Flat  affect.    Data Reviewed: I have personally reviewed following labs and imaging studies  CBC: Recent Labs  Lab 09/19/22 0340 09/19/22 0754 09/20/22 0417 09/21/22 0405 09/22/22 0413  WBC 9.8 9.6 16.4* 15.0* 11.0*  HGB 15.2 14.5 13.6 13.9 14.1  HCT 48.3 45.4 43.6 44.2 43.9  MCV 85.2 84.9 84.7 84.5 83.8  PLT 170 161 152 159 [id number]*   Basic Metabolic Panel: Recent Labs  Lab 09/19/22 0340 09/19/22 0754 09/20/22 0417 09/21/22 0405 09/22/22 0413  NA 137  --  137 137 137  K 3.0*  --  3.9 3.3* 3.7  CL 100  --  102 98 97*  CO2 29  --  28 30 33*  GLUCOSE 259*  --  285* 292* 274*  BUN 13  --  24* 24* 23  CREATININE 1.07 1.05 1.27* 1.11 1.11  CALCIUM 8.6*  --  8.5* 8.3* 8.1*  MG  --   --  2.2 2.3 2.3   GFR: Estimated Creatinine Clearance: 74.8 mL/min (by C-G formula based on SCr of 1.11 mg/dL). Liver Function Tests: Recent Labs  Lab 09/19/22 0340  AST 21  ALT 24  ALKPHOS 84  BILITOT 1.4*  PROT 7.4  ALBUMIN 4.0   No results for input(s): "LIPASE", "AMYLASE" in the last 168 hours. No results for input(s): "AMMONIA" in the last 168 hours. Coagulation Profile: Recent Labs  Lab 09/19/22 0340  INR 0.9   Cardiac Enzymes: No results for input(s): "CKTOTAL", "CKMB", "CKMBINDEX", "TROPONINI" in the last 168 hours. BNP (last 3 results) No results for input(s): "PROBNP" in the last 8760 hours. HbA1C: No results for input(s): "HGBA1C" in the last 72 hours.  CBG: Recent Labs  Lab 09/21/22 0729 09/21/22 1117 09/21/22 1632 09/21/22 2014 09/22/22 0725  GLUCAP 317* 261* 277* 376* 225*   Lipid Profile: No results for input(s): "CHOL", "HDL", "LDLCALC", "TRIG", "CHOLHDL", "LDLDIRECT" in the last 72 hours. Thyroid Function Tests: No results for input(s): "TSH", "T4TOTAL", "FREET4", "T3FREE", "THYROIDAB" in the last 72 hours. Anemia Panel: No results for input(s): "VITAMINB12", "FOLATE", "FERRITIN", "TIBC", "IRON", "RETICCTPCT" in the last 72 hours. Sepsis Labs: No  results for input(s): "PROCALCITON", "LATICACIDVEN" in the last 168 hours.  Recent Results (from the past 240 hour(s))  Resp panel by RT-PCR (RSV, Flu A&B, Covid) Anterior Nasal Swab     Status: None   Collection Time: 09/19/22  3:45 AM   Specimen: Anterior Nasal Swab  Result Value Ref Range Status   SARS Coronavirus 2 by RT PCR NEGATIVE NEGATIVE Final    Comment: (NOTE) SARS-CoV-2 target nucleic acids are NOT DETECTED.  The SARS-CoV-2 RNA is generally detectable in upper respiratory specimens during the acute phase of infection. The lowest concentration of SARS-CoV-2 viral copies this assay can detect is 138 copies/mL. A negative result does not preclude SARS-Cov-2 infection and should not be used as the sole basis for treatment or other patient management decisions. A negative result may occur with  improper specimen collection/handling, submission of specimen other than nasopharyngeal swab, presence of viral mutation(s) within the areas targeted by this assay, and inadequate number of viral copies(<138 copies/mL). A negative result must be combined with clinical observations, patient history, and  epidemiological information. The expected result is Negative.  Fact Sheet for Patients:  EntrepreneurPulse.com.au  Fact Sheet for Healthcare Providers:  IncredibleEmployment.be  This test is no t yet approved or cleared by the [place] FDA and  has been authorized for detection and/or diagnosis of SARS-CoV-2 by FDA under an Emergency Use Authorization (EUA). This EUA will remain  in effect (meaning this test can be used) for the duration of the COVID-19 declaration under Section 564(b)(1) of the Act, 21 U.S.C.section 360bbb-3(b)(1), unless the authorization is terminated  or revoked sooner.       Influenza A by PCR NEGATIVE NEGATIVE Final   Influenza B by PCR NEGATIVE NEGATIVE Final    Comment: (NOTE) The Xpert Xpress SARS-CoV-2/FLU/RSV  plus assay is intended as an aid in the diagnosis of influenza from Nasopharyngeal swab specimens and should not be used as a sole basis for treatment. Nasal washings and aspirates are unacceptable for Xpert Xpress SARS-CoV-2/FLU/RSV testing.  Fact Sheet for Patients: EntrepreneurPulse.com.au  Fact Sheet for Healthcare Providers: IncredibleEmployment.be  This test is not yet approved or cleared by the [place] FDA and has been authorized for detection and/or diagnosis of SARS-CoV-2 by FDA under an Emergency Use Authorization (EUA). This EUA will remain in effect (meaning this test can be used) for the duration of the COVID-19 declaration under Section 564(b)(1) of the Act, 21 U.S.C. section 360bbb-3(b)(1), unless the authorization is terminated or revoked.     Resp Syncytial Virus by PCR NEGATIVE NEGATIVE Final    Comment: (NOTE) Fact Sheet for Patients: EntrepreneurPulse.com.au  Fact Sheet for Healthcare Providers: IncredibleEmployment.be  This test is not yet approved or cleared by the [place] FDA and has been authorized for detection and/or diagnosis of SARS-CoV-2 by FDA under an Emergency Use Authorization (EUA). This EUA will remain in effect (meaning this test can be used) for the duration of the COVID-19 declaration under Section 564(b)(1) of the Act, 21 U.S.C. section 360bbb-3(b)(1), unless the authorization is terminated or revoked.  Performed at [facility], [address] [place], [place] [address]          Radiology Studies: ECHOCARDIOGRAM COMPLETE  Result Date: 09/20/2022    ECHOCARDIOGRAM REPORT   Patient Name:   KARLITO HOGGARTH Date of Exam: 09/20/2022 Medical Rec #:  [id number]        Height:       66.0 in Accession #:    [id number]       Weight:       261.9 lb Date of Birth:  [birth year]         BSA:          2.243 m Patient Age:    43 years         BP:           149/66 mmHg  Patient Gender: M                HR:           75 bpm. Exam Location:  [facility] Procedure: 2D Echo, Cardiac Doppler and Color Doppler Indications:    CHF-Acute Diastolic [id number]  History:        Patient has prior history of Echocardiogram examinations, most                 recent 09/18/2017. COPD; Risk Factors:Hypertension and Diabetes.  Sonographer:    Alvino Chapel RCS Referring Phys: [id number] [place]  1. Left ventricular ejection fraction, by estimation, is  70 to 75%. The left ventricle has hyperdynamic function. The left ventricle has no regional wall motion abnormalities. There is severe concentric left ventricular hypertrophy. Left ventricular diastolic parameters are consistent with Grade II diastolic dysfunction (pseudonormalization).  2. Right ventricular systolic function is normal. The right ventricular size is mildly enlarged. Moderately increased right ventricular wall thickness. Tricuspid regurgitation signal is inadequate for assessing PA pressure.  3. Left atrial size was moderately dilated.  4. Right atrial size was moderately dilated.  5. The mitral valve is normal in structure. No evidence of mitral valve regurgitation. No evidence of mitral stenosis.  6. The aortic valve is tricuspid. There is mild calcification of the aortic valve. Aortic valve regurgitation is not visualized. No aortic stenosis is present.  7. The inferior vena cava is dilated in size with >50% respiratory variability, suggesting right atrial pressure of 8 mmHg.  8. Increased flow velocities may be secondary to anemia, thyrotoxicosis, hyperdynamic or high flow state. Comparison(s): No significant change from prior study. FINDINGS  Left Ventricle: Left ventricular ejection fraction, by estimation, is 70 to 75%. The left ventricle has hyperdynamic function. The left ventricle has no regional wall motion abnormalities. The left ventricular internal cavity size was normal in size. There is severe concentric left  ventricular hypertrophy. Left ventricular diastolic parameters are consistent with Grade II diastolic dysfunction (pseudonormalization). Right Ventricle: The right ventricular size is mildly enlarged. Moderately increased right ventricular wall thickness. Right ventricular systolic function is normal. Tricuspid regurgitation signal is inadequate for assessing PA pressure. Left Atrium: Left atrial size was moderately dilated. Right Atrium: Right atrial size was moderately dilated. Pericardium: There is no evidence of pericardial effusion. Mitral Valve: The mitral valve is normal in structure. No evidence of mitral valve regurgitation. No evidence of mitral valve stenosis. Tricuspid Valve: The tricuspid valve is normal in structure. Tricuspid valve regurgitation is not demonstrated. No evidence of tricuspid stenosis. Aortic Valve: The aortic valve is tricuspid. There is mild calcification of the aortic valve. Aortic valve regurgitation is not visualized. No aortic stenosis is present. Aortic valve mean gradient measures 8.0 mmHg. Aortic valve peak gradient measures 15.1 mmHg. Aortic valve area, by VTI measures 2.55 cm. Pulmonic Valve: The pulmonic valve was not well visualized. Pulmonic valve regurgitation is not visualized. No evidence of pulmonic stenosis. Aorta: The aortic root is normal in size and structure. Venous: The inferior vena cava is dilated in size with greater than 50% respiratory variability, suggesting right atrial pressure of 8 mmHg. IAS/Shunts: No atrial level shunt detected by color flow Doppler.  LEFT VENTRICLE PLAX 2D LVIDd:         5.40 cm   Diastology LVIDs:         2.80 cm   LV e' medial:    5.55 cm/s LV PW:         1.70 cm   LV E/e' medial:  20.2 LV IVS:        1.70 cm   LV e' lateral:   9.14 cm/s LVOT diam:     2.00 cm   LV E/e' lateral: 12.3 LV SV:         96 LV SV Index:   43 LVOT Area:     3.14 cm  RIGHT VENTRICLE RV S prime:     18.30 cm/s TAPSE (M-mode): 2.7 cm LEFT ATRIUM              Index        RIGHT ATRIUM  Index LA diam:        3.60 cm 1.61 cm/m   RA Area:     22.70 cm LA Vol (A2C):   92.7 ml 41.33 ml/m  RA Volume:   78.90 ml  35.18 ml/m LA Vol (A4C):   85.4 ml 38.08 ml/m LA Biplane Vol: 90.9 ml 40.53 ml/m  AORTIC VALVE AV Area (Vmax):    2.36 cm AV Area (Vmean):   2.62 cm AV Area (VTI):     2.55 cm AV Vmax:           194.00 cm/s AV Vmean:          127.000 cm/s AV VTI:            0.375 m AV Peak Grad:      15.1 mmHg AV Mean Grad:      8.0 mmHg LVOT Vmax:         146.00 cm/s LVOT Vmean:        106.000 cm/s LVOT VTI:          0.304 m LVOT/AV VTI ratio: 0.81  AORTA Ao Root diam: 3.80 cm MITRAL VALVE MV Area (PHT): 3.48 cm     SHUNTS MV Decel Time: 218 msec     Systemic VTI:  0.30 m MV E velocity: 112.00 cm/s  Systemic Diam: 2.00 cm MV A velocity: 72.40 cm/s MV E/A ratio:  1.55 Vishnu Priya Mallipeddi Electronically signed by Lorelee Cover Mallipeddi Signature Date/Time: 09/20/2022/8:01:35 PM    Final         Scheduled Meds:  aspirin EC  81 mg Oral Q breakfast   budesonide (PULMICORT) nebulizer solution  0.25 mg Nebulization BID   enoxaparin (LOVENOX) injection  40 mg Subcutaneous Q24H   fluticasone  2 spray Each Nare Daily   furosemide  40 mg Intravenous TID   gabapentin  100 mg Oral TID   hydrALAZINE  100 mg Oral TID   insulin aspart  0-20 Units Subcutaneous TID WC   insulin aspart  0-5 Units Subcutaneous QHS   insulin aspart  8 Units Subcutaneous TID WC   insulin glargine-yfgn  80 Units Subcutaneous Daily   ipratropium-albuterol  3 mL Nebulization BID   isosorbide mononitrate  30 mg Oral Daily   montelukast  10 mg Oral Daily   nebivolol  40 mg Oral Daily   pantoprazole  40 mg Oral Daily   rosuvastatin  40 mg Oral Daily   sodium chloride flush  3 mL Intravenous Q12H   tamsulosin  0.4 mg Oral Daily   Continuous Infusions:  sodium chloride       LOS: 3 days    Time spent: 35 minutes    Kayne Yuhas Darleen Crocker, DO [facility]  If 7PM-7AM,  please contact night-coverage [email or web address] 09/22/2022, 10:37 AM

## 2022-09-22 NOTE — Progress Notes (Signed)
Pt reported gas pains, MD notified. PRN simethicone ordered and given. Pt also reported 8/10 headache. PRN tylenol given. PIV dressing changed.

## 2022-09-23 LAB — CBC
HCT: 50.2 % (ref 39.0–52.0)
Hemoglobin: 15.6 g/dL (ref 13.0–17.0)
MCH: 26.4 pg (ref 26.0–34.0)
MCHC: 31.1 g/dL (ref 30.0–36.0)
MCV: 85.1 fL (ref 80.0–100.0)
Platelets: 177 10*3/uL (ref 150–400)
RBC: 5.9 MIL/uL — ABNORMAL HIGH (ref 4.22–5.81)
RDW: 15 % (ref 11.5–15.5)
WBC: 8.2 10*3/uL (ref 4.0–10.5)
nRBC: 0 % (ref 0.0–0.2)

## 2022-09-23 LAB — GLUCOSE, CAPILLARY
Glucose-Capillary: 95 mg/dL (ref 70–99)
Glucose-Capillary: 200 mg/dL — ABNORMAL HIGH (ref 70–99)

## 2022-09-23 LAB — BASIC METABOLIC PANEL
Anion gap: 7 (ref 5–15)
BUN: 20 mg/dL (ref 8–23)
CO2: 35 mmol/L — ABNORMAL HIGH (ref 22–32)
Calcium: 8.6 mg/dL — ABNORMAL LOW (ref 8.9–10.3)
Chloride: 95 mmol/L — ABNORMAL LOW (ref 98–111)
Creatinine, Ser: 1.05 mg/dL (ref 0.61–1.24)
GFR, Estimated: >60 mL/min (ref >60)
Glucose, Bld: 76 mg/dL (ref 70–99)
Potassium: 3.3 mmol/L — ABNORMAL LOW (ref 3.5–5.1)
Sodium: 137 mmol/L (ref 135–145)

## 2022-09-23 LAB — MAGNESIUM: Magnesium: 2.4 mg/dL (ref 1.7–2.4)

## 2022-09-23 MED ORDER — TORSEMIDE 40 MG PO TABS: 40.0000 mg | ORAL_TABLET | Freq: Every day | ORAL | 0 refills | Status: DC

## 2022-09-23 MED ORDER — POTASSIUM CHLORIDE CRYS ER 20 MEQ PO TBCR
40.0000 meq | EXTENDED_RELEASE_TABLET | Freq: Two times a day (BID) | ORAL | Status: DC
  Administered 2022-09-23: 40 meq via ORAL
  Filled 2022-09-23: qty 2

## 2022-09-23 MED ORDER — SPIRONOLACTONE 25 MG PO TABS: 12.5000 mg | ORAL_TABLET | Freq: Every day | ORAL | 0 refills | Status: DC

## 2022-09-23 NOTE — Discharge Summary (Addendum)
Physician Discharge Summary  Vincent Ramos [id number] DOB: [birth year] DOA: 09/19/2022  PCP: Sonia Side., FNP  Admit date: 09/19/2022  Discharge date: 09/23/2022  Admitted From:Home  Disposition:  Home  Recommendations for Outpatient Follow-up:  Follow up with PCP in 1-2 weeks Follow-up with cardiologist Dr. Irish Lack as recommended in 2 weeks Recommended to remain on torsemide 40 mg daily with careful monitoring of weight.  Discharge weight 256 pounds. Discontinue further use of olmesartan/HCTZ Continue other home medications as prior  Home Health: None  Equipment/Devices: None  Discharge Condition:Stable  CODE STATUS: Full  Diet recommendation: Heart Healthy/carb modified  Brief/Interim Summary:  Vincent Ramos is a 72 y.o. male with medical history significant for chronic diastolic CHF, CAD, COPD, hypertension, dyslipidemia, BPH, and morbid obesity who presented to the ED with worsening respiratory distress over the past few days.  Patient was admitted with acute hypoxemic respiratory failure in the setting of acute on chronic diastolic CHF exacerbation and was initially thought to have some mild COPD exacerbation as well.  He diuresed quite well throughout the course of the stay with Lasix IV 40 mg 3 times daily and 2D echocardiogram demonstrated grade 2 diastolic dysfunction with LVEF 70-75%.  He was initially noted to weigh approximately 273 pounds and discharge weight is 256 pounds.  He appears euvolemic at this time and Reds vest reading is 34% indicating that he is at or near euvolemia.  He has weaned off of oxygen and appears stable for discharge on oral torsemide as recommended moving forward.  He has been instructed to monitor his weight on a daily basis and monitor his sodium intake as well as fluid intake at home.  He has rescue inhaler as needed and has been instructed to follow-up with his cardiologist in the very near future.  No other acute events  noted.  Discharge Diagnoses:  Principal Problem:   Acute respiratory failure with hypoxia (HCC) Active Problems:   Type 2 diabetes mellitus (HCC)   Hypertension   Obesity   Lumbar stenosis   COPD exacerbation (HCC)   Acute on chronic diastolic CHF (congestive heart failure) ([place])  Principal discharge diagnosis: Acute hypoxemic respiratory failure-multifactorial with acute on chronic diastolic CHF exacerbation as well as mild COPD exacerbation.  Discharge Instructions  Discharge Instructions     Ambulatory referral to Cardiology   Complete by: As directed    Diet - low sodium heart healthy   Complete by: As directed    Increase activity slowly   Complete by: As directed       Allergies as of 09/23/2022       Reactions   Amlodipine    Felt "jittery" constantly   Novolog [insulin Aspart] Hives, Itching, Rash   Victoza [liraglutide] Itching, Rash        Medication List     STOP taking these medications    amoxicillin-clavulanate 500-125 MG tablet Commonly known as: AUGMENTIN   cephALEXin 500 MG capsule Commonly known as: KEFLEX   diltiazem 360 MG 24 hr capsule Commonly known as: CARDIZEM CD   furosemide 40 MG tablet Commonly known as: LASIX   olmesartan-hydrochlorothiazide 40-25 MG tablet Commonly known as: BENICAR HCT       TAKE these medications    albuterol 108 (90 Base) MCG/ACT inhaler Commonly known as: VENTOLIN HFA Inhale 2 puffs into the lungs every 6 (six) hours as needed for wheezing or shortness of breath.   aspirin EC 81 MG tablet Take 1 tablet (81 mg  total) by mouth daily with breakfast.   azelastine 0.05 % ophthalmic solution Commonly known as: OPTIVAR Place 1 drop into the left eye See admin instructions. Instill 1 drop into both eyes twice daily for 2 days the day of and the day after monthly eye injections   azelastine 0.1 % nasal spray Commonly known as: ASTELIN Place 2 sprays into both nostrils 2 (two) times daily. Use in each  nostril as directed   Bystolic 20 MG Tabs Generic drug: Nebivolol HCl TAKE 2 TABLETS BY MOUTH DAILY. What changed: how much to take   fluticasone 50 MCG/ACT nasal spray Commonly known as: FLONASE Place 2 sprays into both nostrils daily.   gabapentin 300 MG capsule Commonly known as: NEURONTIN Take 300 mg by mouth 2 (two) times daily.   glipiZIDE 10 MG tablet Commonly known as: GLUCOTROL Take 10 mg by mouth 2 (two) times daily.   hydrALAZINE 100 MG tablet Commonly known as: APRESOLINE Take 1 tablet (100 mg total) by mouth 3 (three) times daily. With meals. Please make overdue appt with Dr. Irish Lack before anymore refills. Thank you 1st attempt   isosorbide mononitrate 30 MG 24 hr tablet Commonly known as: IMDUR Take 1 tablet (30 mg total) by mouth daily.   montelukast 10 MG tablet Commonly known as: SINGULAIR Take 10 mg by mouth daily.   multivitamin with minerals Tabs tablet Take 1 tablet by mouth daily.   polyethylene glycol 17 g packet Commonly known as: MIRALAX / GLYCOLAX Take 17 g by mouth daily. What changed:  when to take this reasons to take this   rosuvastatin 40 MG tablet Commonly known as: CRESTOR Take 40 mg by mouth daily.   sitaGLIPtin 100 MG tablet Commonly known as: JANUVIA Take 100 mg by mouth daily.   spironolactone 25 MG tablet Commonly known as: ALDACTONE Take 0.5 tablets (12.5 mg total) by mouth daily.   tamsulosin 0.4 MG Caps capsule Commonly known as: FLOMAX Take 0.4 mg by mouth daily.   Torsemide 40 MG Tabs Take 40 mg by mouth daily.   Trelegy Ellipta 100-62.5-25 MCG/ACT Aepb Generic drug: Fluticasone-Umeclidin-Vilant Inhale 1 puff into the lungs daily.   Tyler Aas FlexTouch 100 UNIT/ML FlexTouch Pen Generic drug: insulin degludec Inject 80 Units into the skin daily.   Trulicity 1.5 0000000 Sopn Generic drug: Dulaglutide Inject 1.5 mg into the skin every 'Sunday.        Follow-up Information     Smith, Fred A Jr., FNP.  Schedule an appointment as soon as possible for a visit in 1 week(s).   Specialty: Family Medicine Contact information: [address] [place] [address] [phone]         Varanasi, Jayadeep S, MD. Schedule an appointment as soon as possible for a visit in 2 week(s).   Specialties: Cardiology, Radiology, Interventional Cardiology Contact information: [address] [place] [address] [phone]                Allergies  Allergen Reactions   Amlodipine     Felt "jittery" constantly   Novolog [Insulin Aspart] Hives, Itching and Rash   Victoza [Liraglutide] Itching and Rash    Consultations: None   Procedures/Studies: ECHOCARDIOGRAM COMPLETE  Result Date: 09/20/2022    ECHOCARDIOGRAM REPORT   Patient Name:   Vincent Ramos Date of Exam: 09/20/2022 Medical Rec #:  [id number]        Height:       66'$ .0 in Accession #:    [id number]  Weight:       261.9 lb Date of Birth:  [birth year]         BSA:          2.243 m Patient Age:    53 years         BP:           149/66 mmHg Patient Gender: M                HR:           75 bpm. Exam Location:  [facility] Procedure: 2D Echo, Cardiac Doppler and Color Doppler Indications:    CHF-Acute Diastolic [id number]  History:        Patient has prior history of Echocardiogram examinations, most                 recent 09/18/2017. COPD; Risk Factors:Hypertension and Diabetes.  Sonographer:    Alvino Chapel RCS Referring Phys: [id number] [place] D Rawson  1. Left ventricular ejection fraction, by estimation, is 70 to 75%. The left ventricle has hyperdynamic function. The left ventricle has no regional wall motion abnormalities. There is severe concentric left ventricular hypertrophy. Left ventricular diastolic parameters are consistent with Grade II diastolic dysfunction (pseudonormalization).  2. Right ventricular systolic function is normal. The right ventricular size is mildly enlarged. Moderately increased right  ventricular wall thickness. Tricuspid regurgitation signal is inadequate for assessing PA pressure.  3. Left atrial size was moderately dilated.  4. Right atrial size was moderately dilated.  5. The mitral valve is normal in structure. No evidence of mitral valve regurgitation. No evidence of mitral stenosis.  6. The aortic valve is tricuspid. There is mild calcification of the aortic valve. Aortic valve regurgitation is not visualized. No aortic stenosis is present.  7. The inferior vena cava is dilated in size with >50% respiratory variability, suggesting right atrial pressure of 8 mmHg.  8. Increased flow velocities may be secondary to anemia, thyrotoxicosis, hyperdynamic or high flow state. Comparison(s): No significant change from prior study. FINDINGS  Left Ventricle: Left ventricular ejection fraction, by estimation, is 70 to 75%. The left ventricle has hyperdynamic function. The left ventricle has no regional wall motion abnormalities. The left ventricular internal cavity size was normal in size. There is severe concentric left ventricular hypertrophy. Left ventricular diastolic parameters are consistent with Grade II diastolic dysfunction (pseudonormalization). Right Ventricle: The right ventricular size is mildly enlarged. Moderately increased right ventricular wall thickness. Right ventricular systolic function is normal. Tricuspid regurgitation signal is inadequate for assessing PA pressure. Left Atrium: Left atrial size was moderately dilated. Right Atrium: Right atrial size was moderately dilated. Pericardium: There is no evidence of pericardial effusion. Mitral Valve: The mitral valve is normal in structure. No evidence of mitral valve regurgitation. No evidence of mitral valve stenosis. Tricuspid Valve: The tricuspid valve is normal in structure. Tricuspid valve regurgitation is not demonstrated. No evidence of tricuspid stenosis. Aortic Valve: The aortic valve is tricuspid. There is mild  calcification of the aortic valve. Aortic valve regurgitation is not visualized. No aortic stenosis is present. Aortic valve mean gradient measures 8.0 mmHg. Aortic valve peak gradient measures 15.1 mmHg. Aortic valve area, by VTI measures 2.55 cm. Pulmonic Valve: The pulmonic valve was not well visualized. Pulmonic valve regurgitation is not visualized. No evidence of pulmonic stenosis. Aorta: The aortic root is normal in size and structure. Venous: The inferior vena cava is dilated in size with greater than 50% respiratory variability, suggesting right  atrial pressure of 8 mmHg. IAS/Shunts: No atrial level shunt detected by color flow Doppler.  LEFT VENTRICLE PLAX 2D LVIDd:         5.40 cm   Diastology LVIDs:         2.80 cm   LV e' medial:    5.55 cm/s LV PW:         1.70 cm   LV E/e' medial:  20.2 LV IVS:        1.70 cm   LV e' lateral:   9.14 cm/s LVOT diam:     2.00 cm   LV E/e' lateral: 12.3 LV SV:         96 LV SV Index:   43 LVOT Area:     3.14 cm  RIGHT VENTRICLE RV S prime:     18.30 cm/s TAPSE (M-mode): 2.7 cm LEFT ATRIUM             Index        RIGHT ATRIUM           Index LA diam:        3.60 cm 1.61 cm/m   RA Area:     22.70 cm LA Vol (A2C):   92.7 ml 41.33 ml/m  RA Volume:   78.90 ml  35.18 ml/m LA Vol (A4C):   85.4 ml 38.08 ml/m LA Biplane Vol: 90.9 ml 40.53 ml/m  AORTIC VALVE AV Area (Vmax):    2.36 cm AV Area (Vmean):   2.62 cm AV Area (VTI):     2.55 cm AV Vmax:           194.00 cm/s AV Vmean:          127.000 cm/s AV VTI:            0.375 m AV Peak Grad:      15.1 mmHg AV Mean Grad:      8.0 mmHg LVOT Vmax:         146.00 cm/s LVOT Vmean:        106.000 cm/s LVOT VTI:          0.304 m LVOT/AV VTI ratio: 0.81  AORTA Ao Root diam: 3.80 cm MITRAL VALVE MV Area (PHT): 3.48 cm     SHUNTS MV Decel Time: 218 msec     Systemic VTI:  0.30 m MV E velocity: 112.00 cm/s  Systemic Diam: 2.00 cm MV A velocity: 72.40 cm/s MV E/A ratio:  1.55 Vishnu Priya Mallipeddi Electronically signed by Lorelee Cover Mallipeddi Signature Date/Time: 09/20/2022/8:01:35 PM    Final    DG Chest Port 1 View  Result Date: 09/19/2022 CLINICAL DATA:  Shortness of breath. EXAM: PORTABLE CHEST 1 VIEW COMPARISON:  02/03/2018. FINDINGS: The heart is borderline enlarged and mediastinal contours are within normal limits. There is atherosclerotic calcification of the aorta. The pulmonary vasculature is mildly distended. There is chronic elevation of the right diaphragm. Mild airspace disease is noted at the left lung base. There is a small left pleural effusion. No pneumothorax. Cervical spinal fusion hardware is noted. IMPRESSION: 1. Borderline cardiomegaly with pulmonary vascular congestion. 2. Small left pleural effusion with atelectasis, edema, or infiltrate at the left lung base. Electronically Signed   By: Brett Fairy M.D.   On: 09/19/2022 04:08     Discharge Exam: Vitals:   09/23/22 1100 09/23/22 1242  BP:  (!) 105/49  Pulse:  (!) 55  Resp:  20  Temp:  98.5 F (36.9 C)  SpO2:  96% 96%   Vitals:   09/23/22 0900 09/23/22 0905 09/23/22 1100 09/23/22 1242  BP:    (!) 105/49  Pulse:    (!) 55  Resp:    20  Temp:    98.5 F (36.9 C)  TempSrc:    Oral  SpO2: 97% 97% 96% 96%  Weight:      Height:        General: Pt is alert, awake, not in acute distress Cardiovascular: RRR, S1/S2 +, no rubs, no gallops Respiratory: CTA bilaterally, no wheezing, no rhonchi Abdominal: Soft, NT, ND, bowel sounds + Extremities: no edema, no cyanosis    The results of significant diagnostics from this hospitalization (including imaging, microbiology, ancillary and laboratory) are listed below for reference.     Microbiology: Recent Results (from the past 240 hour(s))  Resp panel by RT-PCR (RSV, Flu A&B, Covid) Anterior Nasal Swab     Status: None   Collection Time: 09/19/22  3:45 AM   Specimen: Anterior Nasal Swab  Result Value Ref Range Status   SARS Coronavirus 2 by RT PCR NEGATIVE NEGATIVE Final    Comment:  (NOTE) SARS-CoV-2 target nucleic acids are NOT DETECTED.  The SARS-CoV-2 RNA is generally detectable in upper respiratory specimens during the acute phase of infection. The lowest concentration of SARS-CoV-2 viral copies this assay can detect is 138 copies/mL. A negative result does not preclude SARS-Cov-2 infection and should not be used as the sole basis for treatment or other patient management decisions. A negative result may occur with  improper specimen collection/handling, submission of specimen other than nasopharyngeal swab, presence of viral mutation(s) within the areas targeted by this assay, and inadequate number of viral copies(<138 copies/mL). A negative result must be combined with clinical observations, patient history, and epidemiological information. The expected result is Negative.  Fact Sheet for Patients:  EntrepreneurPulse.com.au  Fact Sheet for Healthcare Providers:  IncredibleEmployment.be  This test is no t yet approved or cleared by the [place] FDA and  has been authorized for detection and/or diagnosis of SARS-CoV-2 by FDA under an Emergency Use Authorization (EUA). This EUA will remain  in effect (meaning this test can be used) for the duration of the COVID-19 declaration under Section 564(b)(1) of the Act, 21 U.S.C.section 360bbb-3(b)(1), unless the authorization is terminated  or revoked sooner.       Influenza A by PCR NEGATIVE NEGATIVE Final   Influenza B by PCR NEGATIVE NEGATIVE Final    Comment: (NOTE) The Xpert Xpress SARS-CoV-2/FLU/RSV plus assay is intended as an aid in the diagnosis of influenza from Nasopharyngeal swab specimens and should not be used as a sole basis for treatment. Nasal washings and aspirates are unacceptable for Xpert Xpress SARS-CoV-2/FLU/RSV testing.  Fact Sheet for Patients: EntrepreneurPulse.com.au  Fact Sheet for Healthcare  Providers: IncredibleEmployment.be  This test is not yet approved or cleared by the [place] FDA and has been authorized for detection and/or diagnosis of SARS-CoV-2 by FDA under an Emergency Use Authorization (EUA). This EUA will remain in effect (meaning this test can be used) for the duration of the COVID-19 declaration under Section 564(b)(1) of the Act, 21 U.S.C. section 360bbb-3(b)(1), unless the authorization is terminated or revoked.     Resp Syncytial Virus by PCR NEGATIVE NEGATIVE Final    Comment: (NOTE) Fact Sheet for Patients: EntrepreneurPulse.com.au  Fact Sheet for Healthcare Providers: IncredibleEmployment.be  This test is not yet approved or cleared by the [place] FDA and has been authorized for detection and/or  diagnosis of SARS-CoV-2 by FDA under an Emergency Use Authorization (EUA). This EUA will remain in effect (meaning this test can be used) for the duration of the COVID-19 declaration under Section 564(b)(1) of the Act, 21 U.S.C. section 360bbb-3(b)(1), unless the authorization is terminated or revoked.  Performed at [facility], [address] [place], [place] [address]      Labs: BNP (last 3 results) Recent Labs    09/19/22 0340  BNP [id number]*   Basic Metabolic Panel: Recent Labs  Lab 09/19/22 0340 09/19/22 0754 09/20/22 0417 09/21/22 0405 09/22/22 0413 09/23/22 0605  NA 137  --  137 137 137 137  K 3.0*  --  3.9 3.3* 3.7 3.3*  CL 100  --  102 98 97* 95*  CO2 29  --  28 30 33* 35*  GLUCOSE 259*  --  285* 292* 274* 76  BUN 13  --  24* 24* 23 20  CREATININE 1.07 1.05 1.27* 1.11 1.11 1.05  CALCIUM 8.6*  --  8.5* 8.3* 8.1* 8.6*  MG  --   --  2.2 2.3 2.3 2.4   Liver Function Tests: Recent Labs  Lab 09/19/22 0340  AST 21  ALT 24  ALKPHOS 84  BILITOT 1.4*  PROT 7.4  ALBUMIN 4.0   No results for input(s): "LIPASE", "AMYLASE" in the last 168 hours. No results for  input(s): "AMMONIA" in the last 168 hours. CBC: Recent Labs  Lab 09/19/22 0754 09/20/22 0417 09/21/22 0405 09/22/22 0413 09/23/22 0605  WBC 9.6 16.4* 15.0* 11.0* 8.2  HGB 14.5 13.6 13.9 14.1 15.6  HCT 45.4 43.6 44.2 43.9 50.2  MCV 84.9 84.7 84.5 83.8 85.1  PLT 161 152 159 148* 177   Cardiac Enzymes: No results for input(s): "CKTOTAL", "CKMB", "CKMBINDEX", "TROPONINI" in the last 168 hours. BNP: Invalid input(s): "POCBNP" CBG: Recent Labs  Lab 09/22/22 1125 09/22/22 1623 09/22/22 2139 09/23/22 0808 09/23/22 1124  GLUCAP 194* 208* 105* 95 200*   D-Dimer No results for input(s): "DDIMER" in the last 72 hours. Hgb A1c No results for input(s): "HGBA1C" in the last 72 hours. Lipid Profile No results for input(s): "CHOL", "HDL", "LDLCALC", "TRIG", "CHOLHDL", "LDLDIRECT" in the last 72 hours. Thyroid function studies No results for input(s): "TSH", "T4TOTAL", "T3FREE", "THYROIDAB" in the last 72 hours.  Invalid input(s): "FREET3" Anemia work up No results for input(s): "VITAMINB12", "FOLATE", "FERRITIN", "TIBC", "IRON", "RETICCTPCT" in the last 72 hours. Urinalysis    Component Value Date/Time   COLORURINE AMBER (A) 05/08/2019 1730   APPEARANCEUR HAZY (A) 05/08/2019 1730   LABSPEC 1.029 05/08/2019 1730   PHURINE 5.0 05/08/2019 1730   GLUCOSEU NEGATIVE 05/08/2019 1730   HGBUR NEGATIVE 05/08/2019 1730   BILIRUBINUR NEGATIVE 05/08/2019 1730   KETONESUR 5 (A) 05/08/2019 1730   PROTEINUR 100 (A) 05/08/2019 1730   NITRITE NEGATIVE 05/08/2019 1730   LEUKOCYTESUR NEGATIVE 05/08/2019 1730   Sepsis Labs Recent Labs  Lab 09/20/22 0417 09/21/22 0405 09/22/22 0413 09/23/22 0605  WBC 16.4* 15.0* 11.0* 8.2   Microbiology Recent Results (from the past 240 hour(s))  Resp panel by RT-PCR (RSV, Flu A&B, Covid) Anterior Nasal Swab     Status: None   Collection Time: 09/19/22  3:45 AM   Specimen: Anterior Nasal Swab  Result Value Ref Range Status   SARS Coronavirus 2 by RT  PCR NEGATIVE NEGATIVE Final    Comment: (NOTE) SARS-CoV-2 target nucleic acids are NOT DETECTED.  The SARS-CoV-2 RNA is generally detectable in upper respiratory specimens during the  acute phase of infection. The lowest concentration of SARS-CoV-2 viral copies this assay can detect is 138 copies/mL. A negative result does not preclude SARS-Cov-2 infection and should not be used as the sole basis for treatment or other patient management decisions. A negative result may occur with  improper specimen collection/handling, submission of specimen other than nasopharyngeal swab, presence of viral mutation(s) within the areas targeted by this assay, and inadequate number of viral copies(<138 copies/mL). A negative result must be combined with clinical observations, patient history, and epidemiological information. The expected result is Negative.  Fact Sheet for Patients:  EntrepreneurPulse.com.au  Fact Sheet for Healthcare Providers:  IncredibleEmployment.be  This test is no t yet approved or cleared by the [place] FDA and  has been authorized for detection and/or diagnosis of SARS-CoV-2 by FDA under an Emergency Use Authorization (EUA). This EUA will remain  in effect (meaning this test can be used) for the duration of the COVID-19 declaration under Section 564(b)(1) of the Act, 21 U.S.C.section 360bbb-3(b)(1), unless the authorization is terminated  or revoked sooner.       Influenza A by PCR NEGATIVE NEGATIVE Final   Influenza B by PCR NEGATIVE NEGATIVE Final    Comment: (NOTE) The Xpert Xpress SARS-CoV-2/FLU/RSV plus assay is intended as an aid in the diagnosis of influenza from Nasopharyngeal swab specimens and should not be used as a sole basis for treatment. Nasal washings and aspirates are unacceptable for Xpert Xpress SARS-CoV-2/FLU/RSV testing.  Fact Sheet for Patients: EntrepreneurPulse.com.au  Fact Sheet for  Healthcare Providers: IncredibleEmployment.be  This test is not yet approved or cleared by the [place] FDA and has been authorized for detection and/or diagnosis of SARS-CoV-2 by FDA under an Emergency Use Authorization (EUA). This EUA will remain in effect (meaning this test can be used) for the duration of the COVID-19 declaration under Section 564(b)(1) of the Act, 21 U.S.C. section 360bbb-3(b)(1), unless the authorization is terminated or revoked.     Resp Syncytial Virus by PCR NEGATIVE NEGATIVE Final    Comment: (NOTE) Fact Sheet for Patients: EntrepreneurPulse.com.au  Fact Sheet for Healthcare Providers: IncredibleEmployment.be  This test is not yet approved or cleared by the [place] FDA and has been authorized for detection and/or diagnosis of SARS-CoV-2 by FDA under an Emergency Use Authorization (EUA). This EUA will remain in effect (meaning this test can be used) for the duration of the COVID-19 declaration under Section 564(b)(1) of the Act, 21 U.S.C. section 360bbb-3(b)(1), unless the authorization is terminated or revoked.  Performed at [facility], [address], [place], [place] [address]      Time coordinating discharge: 35 minutes  SIGNED:   Rodena Goldmann, DO [facility] 09/23/2022, 2:50 PM  If 7PM-7AM, please contact night-coverage [email or web address]

## 2022-09-23 NOTE — Progress Notes (Signed)
   09/23/22 1300  ReDS Vest / Clip  Station Marker D  Ruler Value 44  ReDS Value Range < 36  ReDS Actual Value 34

## 2022-09-23 NOTE — Care Management Important Message (Signed)
Important Message  Patient Details  Name: Vincent Ramos MRN: [id number] Date of Birth: 1951-02-05   Medicare Important Message Given:  Yes     Tommy Medal 09/23/2022, 9:26 AM

## 2022-09-23 NOTE — Plan of Care (Signed)
  Problem: Education: Goal: Ability to describe self-care measures that may prevent or decrease complications (Diabetes Survival Skills Education) will improve Outcome: Progressing Goal: Individualized Educational Video(s) Outcome: Progressing   Problem: Education: Goal: Individualized Educational Video(s) Outcome: Progressing   

## 2022-09-23 NOTE — Progress Notes (Signed)
Nsg Discharge Note  Admit Date:  09/19/2022 Discharge date: 09/23/2022   Vincent Ramos to be D/C'd Home per MD order.  AVS completed.   Patient/caregiver able to verbalize understanding.  Discharge Medication: Allergies as of 09/23/2022       Reactions   Amlodipine    Felt "jittery" constantly   Novolog [insulin Aspart] Hives, Itching, Rash   Victoza [liraglutide] Itching, Rash        Medication List     STOP taking these medications    amoxicillin-clavulanate 500-125 MG tablet Commonly known as: AUGMENTIN   cephALEXin 500 MG capsule Commonly known as: KEFLEX   diltiazem 360 MG 24 hr capsule Commonly known as: CARDIZEM CD   furosemide 40 MG tablet Commonly known as: LASIX   olmesartan-hydrochlorothiazide 40-25 MG tablet Commonly known as: BENICAR HCT       TAKE these medications    albuterol 108 (90 Base) MCG/ACT inhaler Commonly known as: VENTOLIN HFA Inhale 2 puffs into the lungs every 6 (six) hours as needed for wheezing or shortness of breath.   aspirin EC 81 MG tablet Take 1 tablet (81 mg total) by mouth daily with breakfast.   azelastine 0.05 % ophthalmic solution Commonly known as: OPTIVAR Place 1 drop into the left eye See admin instructions. Instill 1 drop into both eyes twice daily for 2 days the day of and the day after monthly eye injections   azelastine 0.1 % nasal spray Commonly known as: ASTELIN Place 2 sprays into both nostrils 2 (two) times daily. Use in each nostril as directed   Bystolic 20 MG Tabs Generic drug: Nebivolol HCl TAKE 2 TABLETS BY MOUTH DAILY. What changed: how much to take   fluticasone 50 MCG/ACT nasal spray Commonly known as: FLONASE Place 2 sprays into both nostrils daily.   gabapentin 300 MG capsule Commonly known as: NEURONTIN Take 300 mg by mouth 2 (two) times daily.   glipiZIDE 10 MG tablet Commonly known as: GLUCOTROL Take 10 mg by mouth 2 (two) times daily.   hydrALAZINE 100 MG tablet Commonly  known as: APRESOLINE Take 1 tablet (100 mg total) by mouth 3 (three) times daily. With meals. Please make overdue appt with Dr. Irish Lack before anymore refills. Thank you 1st attempt   isosorbide mononitrate 30 MG 24 hr tablet Commonly known as: IMDUR Take 1 tablet (30 mg total) by mouth daily.   montelukast 10 MG tablet Commonly known as: SINGULAIR Take 10 mg by mouth daily.   multivitamin with minerals Tabs tablet Take 1 tablet by mouth daily.   polyethylene glycol 17 g packet Commonly known as: MIRALAX / GLYCOLAX Take 17 g by mouth daily. What changed:  when to take this reasons to take this   rosuvastatin 40 MG tablet Commonly known as: CRESTOR Take 40 mg by mouth daily.   sitaGLIPtin 100 MG tablet Commonly known as: JANUVIA Take 100 mg by mouth daily.   spironolactone 25 MG tablet Commonly known as: ALDACTONE Take 0.5 tablets (12.5 mg total) by mouth daily.   tamsulosin 0.4 MG Caps capsule Commonly known as: FLOMAX Take 0.4 mg by mouth daily.   Torsemide 40 MG Tabs Take 40 mg by mouth daily.   Trelegy Ellipta 100-62.5-25 MCG/ACT Aepb Generic drug: Fluticasone-Umeclidin-Vilant Inhale 1 puff into the lungs daily.   Tyler Aas FlexTouch 100 UNIT/ML FlexTouch Pen Generic drug: insulin degludec Inject 80 Units into the skin daily.   Trulicity 1.5 0000000 Sopn Generic drug: Dulaglutide Inject 1.5 mg into the skin every  Sunday.        Discharge Assessment: Vitals:   09/23/22 1100 09/23/22 1242  BP:  (!) 105/49  Pulse:  (!) 55  Resp:  20  Temp:  98.5 F (36.9 C)  SpO2: 96% 96%   Skin clean, dry and intact without evidence of skin break down, no evidence of skin tears noted. IV catheter discontinued intact. Site without signs and symptoms of complications - no redness or edema noted at insertion site, patient denies c/o pain - only slight tenderness at site.  Dressing with slight pressure applied.  D/c Instructions-Education: Discharge instructions given  to patient/family with verbalized understanding. D/c education completed with patient/family including follow up instructions, medication list, d/c activities limitations if indicated, with other d/c instructions as indicated by MD - patient able to verbalize understanding, all questions fully answered. Patient instructed to return to ED, call 911, or call MD for any changes in condition.  Patient escorted via [place], and D/C home via private auto.  Kathie Rhodes, RN 09/23/2022 2:30 PM

## 2022-10-03 NOTE — Progress Notes (Signed)
Office Visit    Patient Name: Vincent Ramos Date of Encounter: 10/04/2022  Primary Care Provider:  Raymon Mutton., FNP Primary Cardiologist:  Lance Muss, MD Primary Electrophysiologist: None  Chief Complaint    Vincent Ramos is a 72 y.o. male with PMH of diastolic CHF, HTN, COPD, HLD, DM type II, morbid obesity, nonobstructive CAD s/p LHC who presents today for 33-month follow-up of CAD and HTN.   Past Medical History    Past Medical History:  Diagnosis Date   Abnormal nuclear cardiac imaging test 12/27/2017   Acute on chronic diastolic CHF (congestive heart failure) (HCC) 09/17/2017   Acute respiratory failure with hypoxia (HCC) 06/01/2017   Arthritis    Asthma    CAD (coronary artery disease)    a. 11/2017: cath showing widely patent coronary arteries with normal left main, 30% mid LAD, luminal irregularities in the proximal and mid circumflex, and 30 to 40% stenosis in the mid RCA.   CAP (community acquired pneumonia) 07/31/2014   Community acquired pneumonia 07/31/2014   Constipation 02/06/2018   COPD exacerbation (HCC) 09/17/2017   Dysphagia    Elevated troponin 09/17/2017   Enlarged prostate    Grade II diastolic dysfunction    Hypertension    Lumbar stenosis 10/07/2015   Obesity 07/31/2014   PNA (pneumonia) 05/30/2017   Pneumonia    Pneumonia    Polyp of rectum    Respiratory failure (HCC) 09/18/2017   SBO (small bowel obstruction) (HCC) 05/08/2019   Shortness of breath    Sleep apnea    Sleep Study 08/21/17: Severe OSA   Type 2 diabetes mellitus (HCC)    Past Surgical History:  Procedure Laterality Date   ANTERIOR CERVICAL DECOMP/DISCECTOMY FUSION N/A 09/17/2020   Procedure: Anterior Cevrvical Decompresion / Discectomy Fusion Cervical four-five,Cervical five-six,Cervical six- seven;  Surgeon: Bedelia Person, MD;  Location: [facility] OR;  Service: Neurosurgery;  Laterality: N/A;   CIRCUMCISION     COLONOSCOPY WITH PROPOFOL N/A 08/08/2017   Procedure:  COLONOSCOPY WITH PROPOFOL;  Surgeon: West Bali, MD;  Location: [facility] ENDO SUITE;  Service: Endoscopy;  Laterality: N/A;  1:00pm   ESOPHAGEAL DILATION N/A 06/07/2017   Procedure: ESOPHAGEAL DILATION;  Surgeon: Corbin Ade, MD;  Location: [facility]O SUITE;  Service: Endoscopy;  Laterality: N/A;   ESOPHAGOGASTRODUODENOSCOPY (EGD) WITH PROPOFOL N/A 06/07/2017   Procedure: ESOPHAGOGASTRODUODENOSCOPY (EGD) WITH PROPOFOL;  Surgeon: Corbin Ade, MD;  Location: [facility] END[facility] SUITE;  Service: Endoscopy;  Laterality: N/A;   HYDROCELE EXCISION  10/14/2011   Procedure: HYDROCELECTOMY ADULT;  Surgeon: Anner Crete, MD;  Location: [facility] ORS;  Service: Urology;  Laterality: Right;   LACERATION REPAIR     left hand   LUMBAR LAMINECTOMY/DECOMPRESSION MICRODISCECTOMY Right 10/07/2015   Procedure: Right Lumbar four-five ,lumbar five sacral-one Laminectomy;  Surgeon: Hilda Lias, MD;  Location: [facility] NEURO ORS;  Service: Neurosurgery;  Laterality: Right;   RIGHT/LEFT HEART CATH AND CORONARY ANGIOGRAPHY N/A 12/27/2017   Procedure: RIGHT/LEFT HEART CATH AND CORONARY ANGIOGRAPHY;  Surgeon: Lyn Records, MD;  Location: [facility] INVASIVE CV LAB;  Service: Cardiovascular;  Laterality: N/A;    Allergies  Allergies  Allergen Reactions   Amlodipine     Felt "jittery" constantly   Novolog [Insulin Aspart] Hives, Itching and Rash   Victoza [Liraglutide] Itching and Rash    History of Present Illness    Vincent Ramos  is a 72 year old male with the above mention past medical history who presents  today for 1 year follow-up of CAD.  Vincent Ramos was recently seen by Dr. Larita Fife for hypertension.  He had 2D echo completed 09/2017 that showed EF of 60 to 65% and Myoview completed 4/19 with an inferior defect and partial reversible EF of 45%.  He underwent LHC by Dr. Katrinka Blazing that showed nonobstructive CAD in 11/2017 widely patent coronary arteries with normal left main, 30% mid LAD, luminal irregularities in the proximal and mid  circumflex, and 30 to 40% stenosis in the mid RCA. EF was estimated at 40 to 45% and he was noted to have severely elevated end-diastolic pressure consistent with chronic combined systolic and diastolic heart failure.  Patient was noted to have severe hypertension during procedure and BP medications were titrated closely.  Renal duplex was completed showing no evidence of renal artery stenosis.  Patient has issues with compliance and self adjusting medications.  He stopped his statins due to myalgias he was seen by Dr. Eldridge Dace in 10/2019 and patient reported some rare chest tightness with increased heart rate due to ambulation and palpitations.  He was started on Cardizem to help with BP and palpitations.  He was also referred to HTN clinic to see Pharm.D.  He also had lower extremity swelling with plan to switch to furosemide if volume continued to increase.  He was last seen by Dr. Eldridge Dace on 08/20/2021 blood pressures were reasonably controlled with follow-ups with Pharm.D. he presented to the ED on 09/19/2022 with worsening respiratory distress and was admitted for acute hypoxic respiratory failure with acute on chronic CHF.  He was found to have BNP elevation and chest x-ray showed signs of mild volume overload.  He was treated with Lasix 40 mg 3 times daily.  He had a 17 pound weight loss during his hospitalization.  He was eventually weaned off oxygen and was instructed to monitor his weights.   Vincent Ramos presents today for posthospital follow-up.  Since last being seen in the office patient reports that he is doing much better and has less shortness of breath and swelling since his discharge.  His blood pressures have been well-controlled and today are 130/58 with heart rate of 59.  He is tolerating his current medications without any adverse reactions.  He is also abstaining from excess salt and is following a 2g salt diet and also abstaining from greater than 64 ounces of H2O.  He is euvolemic and has  trace lower extremity edema +1 that is slightly pitting.  He is completing activities at home and is using his treadmill which she walks 15 minutes a day 4 times a week.  He is also going to the park and walking when the weather permits.  Patient denies chest pain, palpitations, dyspnea, PND, orthopnea, nausea, vomiting, dizziness, syncope, edema, weight gain, or early satiety.   Home Medications    Current Outpatient Medications  Medication Sig Dispense Refill   albuterol (VENTOLIN HFA) 108 (90 Base) MCG/ACT inhaler Inhale 2 puffs into the lungs every 6 (six) hours as needed for wheezing or shortness of breath.     aspirin EC 81 MG tablet Take 1 tablet (81 mg total) by mouth daily with breakfast. 30 tablet 4   azelastine (ASTELIN) 0.1 % nasal spray Place 2 sprays into both nostrils 2 (two) times daily. Use in each nostril as directed 30 mL 12   azelastine (OPTIVAR) 0.05 % ophthalmic solution Place 1 drop into the left eye See admin instructions. Instill 1 drop into both eyes twice daily  for 2 days the day of and the day after monthly eye injections     budesonide-formoterol (SYMBICORT) 160-4.5 MCG/ACT inhaler Inhale 2 puffs into the lungs 2 (two) times daily.     BYSTOLIC 20 MG TABS TAKE 2 TABLETS BY MOUTH DAILY. (Patient taking differently: Take 20 mg by mouth daily.) 180 tablet 0   Difluprednate (DUREZOL) 0.05 % EMUL Place 1 drop into both eyes as needed.     fluticasone (FLONASE) 50 MCG/ACT nasal spray Place 2 sprays into both nostrils daily.     gabapentin (NEURONTIN) 300 MG capsule Take 300 mg by mouth 2 (two) times daily.     glipiZIDE (GLUCOTROL) 10 MG tablet Take 10 mg by mouth 2 (two) times daily.     hydrALAZINE (APRESOLINE) 100 MG tablet Take 1 tablet (100 mg total) by mouth 3 (three) times daily. With meals. Please make overdue appt with Dr. Eldridge Dace before anymore refills. Thank you 1st attempt 90 tablet 0   isosorbide mononitrate (IMDUR) 30 MG 24 hr tablet Take 1 tablet (30 mg total)  by mouth daily. 30 tablet 5   montelukast (SINGULAIR) 10 MG tablet Take 10 mg by mouth daily.     polyethylene glycol (MIRALAX / GLYCOLAX) 17 g packet Take 17 g by mouth daily. (Patient taking differently: Take 17 g by mouth daily as needed for moderate constipation.) 30 each 2   rosuvastatin (CRESTOR) 40 MG tablet Take 40 mg by mouth daily.     sitaGLIPtin (JANUVIA) 100 MG tablet Take 100 mg by mouth daily.     spironolactone (ALDACTONE) 25 MG tablet Take 0.5 tablets (12.5 mg total) by mouth daily. 15 tablet 0   tamsulosin (FLOMAX) 0.4 MG CAPS capsule Take 0.4 mg by mouth daily.     Torsemide 40 MG TABS Take 40 mg by mouth daily. 60 tablet 0   TRESIBA FLEXTOUCH 100 UNIT/ML FlexTouch Pen Inject 80 Units into the skin daily.     TRULICITY 1.5 MG/0.5ML SOPN Inject 1.5 mg into the skin every Sunday.     No current facility-administered medications for this visit.     Review of Systems  Please see the history of present illness.    (+) Trace lower extremity edema (+) Shortness of breath with heavy exertion  All other systems reviewed and are otherwise negative except as noted above.  Physical Exam    Wt Readings from Last 3 Encounters:  10/04/22 257 lb 9.6 oz (116.8 kg)  09/23/22 256 lb 11.2 oz (116.4 kg)  11/23/21 276 lb 1.6 oz (125.2 kg)   VS: Vitals:   10/04/22 1116  BP: (!) 130/58  Pulse: (!) 59  SpO2: 93%  ,Body mass index is 41.58 kg/m.  Constitutional:      Appearance: Healthy appearance. Not in distress.  Neck:     Vascular: JVD normal.  Pulmonary:     Effort: Pulmonary effort is normal.     Breath sounds: No wheezing. No rales. Diminished in the bases Cardiovascular:     Normal rate. Regular rhythm. Normal S1. Normal S2.      Murmurs: There is no murmur.  Edema:    Peripheral edema absent.  Abdominal:     Palpations: Abdomen is soft non tender. There is no hepatomegaly.  Skin:    General: Skin is warm and dry.  Neurological:     General: No focal deficit  present.     Mental Status: Alert and oriented to person, place and time.     Cranial  Nerves: Cranial nerves are intact.  EKG/LABS/ Recent Cardiac Studies    ECG personally reviewed by me today -sinus bradycardia with left atrial enlargement and rate of 59 bpm with no acute changes and TWI in lead III consistent with previous EKG.  Risk Assessment/Calculations:      Lab Results  Component Value Date   WBC 8.2 09/23/2022   HGB 15.6 09/23/2022   HCT 50.2 09/23/2022   MCV 85.1 09/23/2022   PLT 177 09/23/2022   Lab Results  Component Value Date   CREATININE 1.05 09/23/2022   BUN 20 09/23/2022   NA 137 09/23/2022   K 3.3 (L) 09/23/2022   CL 95 (L) 09/23/2022   CO2 35 (H) 09/23/2022   Lab Results  Component Value Date   ALT 24 09/19/2022   AST 21 09/19/2022   ALKPHOS 84 09/19/2022   BILITOT 1.4 (H) 09/19/2022   Lab Results  Component Value Date   CHOL 193 01/04/2013   HDL 45 01/04/2013   LDLCALC 122 (H) 01/04/2013   TRIG 129 01/04/2013   CHOLHDL 4.3 01/04/2013    Lab Results  Component Value Date   HGBA1C 7.3 (H) 09/19/2022    Cardiac Studies & Procedures   CARDIAC CATHETERIZATION  CARDIAC CATHETERIZATION 12/27/2017  Narrative  Widely patent coronary arteries with normal left main, 30% mid LAD, luminal irregularities in the proximal and mid circumflex, and 30 to 40% stenosis in the mid RCA.  Mild global left ventricular systolic dysfunction with EF 40 to 45%.  Severely elevated end-diastolic pressure consistent with chronic combined systolic and diastolic heart failure.  Severe poorly controlled blood pressure during the procedure.  Severe BP elevation during the procedure requiring IV hydralazine, labetalol, and IV nitroglycerin drip.  Stable right femoral after Mynx arteriotomy closure device.  RECOMMENDATIONS:   IV Lasix x1 and more aggressive outpatient diuresis  Guideline directed therapy for systolic/diastolic heart failure.  Findings Coronary  Findings Diagnostic  Dominance: Right  Left Anterior Descending Ost LAD to Prox LAD lesion is 30% stenosed.  Left Circumflex  First Obtuse Marginal Branch Vessel is small in size.  Right Coronary Artery Mid RCA lesion is 40% stenosed.  Intervention  No interventions have been documented.   STRESS TESTS  NM MYOCAR MULTI W/SPECT W 11/03/2017  Narrative  No diagnostic ST segment changes to indicate ischemia. No chest pain reported. Hypertensive response noted. Low risk Duke treadmill score of 5.  Small, mild intensity, apical anteroseptal defect that is reversible and consistent with a small ischemic territory.  Medium sized, moderate intensity, inferior defect that is fixed at the base and partially reversible in the mid to apical segment. This is consistent with scar and mild peri-infarct ischemia.  Blood pressure demonstrated a hypertensive response to exercise.  This is an intermediate risk study.  Nuclear stress EF: 44%. Consider echocardiogram for further assessment of LVEF.   ECHOCARDIOGRAM  ECHOCARDIOGRAM COMPLETE 09/20/2022  Narrative ECHOCARDIOGRAM REPORT    Patient Name:   Vincent Ramos Date of Exam: 09/20/2022 Medical Rec #:  [id number]        Height:       66.0 in Accession #:    [id number]       Weight:       261.9 lb Date of Birth:  [birth year]         BSA:          2.243 m Patient Age:    71 years         BP:  149/66 mmHg Patient Gender: M                HR:           75 bpm. [facility]  Procedure: 2D Echo, Cardiac Doppler and Color Doppler  Indications:    CHF-Acute Diastolic I50.31  History:        Patient has prior history of Echocardiogram examinations, most recent 09/18/2017. COPD; Risk Factors:Hypertension and Diabetes.  Sonographer:    Celesta Gentile RCS Referring Phys: [id number] PRATIK D [facility]  IMPRESSIONS   1. Left ventricular ejection fraction, by estimation, is 70 to 75%. The left ventricle has hyperdynamic  function. The left ventricle has no regional wall motion abnormalities. There is severe concentric left ventricular hypertrophy. Left ventricular diastolic parameters are consistent with Grade II diastolic dysfunction (pseudonormalization). 2. Right ventricular systolic function is normal. The right ventricular size is mildly enlarged. Moderately increased right ventricular wall thickness. Tricuspid regurgitation signal is inadequate for assessing PA pressure. 3. Left atrial size was moderately dilated. 4. Right atrial size was moderately dilated. 5. The mitral valve is normal in structure. No evidence of mitral valve regurgitation. No evidence of mitral stenosis. 6. The aortic valve is tricuspid. There is mild calcification of the aortic valve. Aortic valve regurgitation is not visualized. No aortic stenosis is present. 7. The inferior vena cava is dilated in size with >50% respiratory variability, suggesting right atrial pressure of 8 mmHg. 8. Increased flow velocities may be secondary to anemia, thyrotoxicosis, hyperdynamic or high flow state.  Comparison(s): No significant change from prior study.  FINDINGS Left Ventricle: Left ventricular ejection fraction, by estimation, is 70 to 75%. The left ventricle has hyperdynamic function. The left ventricle has no regional wall motion abnormalities. The left ventricular internal cavity size was normal in size. There is severe concentric left ventricular hypertrophy. Left ventricular diastolic parameters are consistent with Grade II diastolic dysfunction (pseudonormalization).  Right Ventricle: The right ventricular size is mildly enlarged. Moderately increased right ventricular wall thickness. Right ventricular systolic function is normal. Tricuspid regurgitation signal is inadequate for assessing PA pressure.  Left Atrium: Left atrial size was moderately dilated.  Right Atrium: Right atrial size was moderately dilated.  Pericardium: There is no  evidence of pericardial effusion.  Mitral Valve: The mitral valve is normal in structure. No evidence of mitral valve regurgitation. No evidence of mitral valve stenosis.  Tricuspid Valve: The tricuspid valve is normal in structure. Tricuspid valve regurgitation is not demonstrated. No evidence of tricuspid stenosis.  Aortic Valve: The aortic valve is tricuspid. There is mild calcification of the aortic valve. Aortic valve regurgitation is not visualized. No aortic stenosis is present. Aortic valve mean gradient measures 8.0 mmHg. Aortic valve peak gradient measures 15.1 mmHg. Aortic valve area, by VTI measures 2.55 cm.  Pulmonic Valve: The pulmonic valve was not well visualized. Pulmonic valve regurgitation is not visualized. No evidence of pulmonic stenosis.  Aorta: The aortic root is normal in size and structure.  Venous: The inferior vena cava is dilated in size with greater than 50% respiratory variability, suggesting right atrial pressure of 8 mmHg.  IAS/Shunts: No atrial level shunt detected by color flow Doppler.   LEFT VENTRICLE PLAX 2D LVIDd:         5.40 cm   Diastology LVIDs:         2.80 cm   LV e' medial:    5.55 cm/s LV PW:         1.70  cm   LV E/e' medial:  20.2 LV IVS:        1.70 cm   LV e' lateral:   9.14 cm/s LVOT diam:     2.00 cm   LV E/e' lateral: 12.3 LV SV:         96 LV SV Index:   43 LVOT Area:     3.14 cm   RIGHT VENTRICLE RV S prime:     18.30 cm/s TAPSE (M-mode): 2.7 cm  LEFT ATRIUM             Index        RIGHT ATRIUM           Index LA diam:        3.60 cm 1.61 cm/m   RA Area:     22.70 cm LA Vol (A2C):   92.7 ml 41.33 ml/m  RA Volume:   78.90 ml  35.18 ml/m LA Vol (A4C):   85.4 ml 38.08 ml/m LA Biplane Vol: 90.9 ml 40.53 ml/m AORTIC VALVE AV Area (Vmax):    2.36 cm AV Area (Vmean):   2.62 cm AV Area (VTI):     2.55 cm AV Vmax:           194.00 cm/s AV Vmean:          127.000 cm/s AV VTI:            0.375 m AV Peak Grad:       15.1 mmHg AV Mean Grad:      8.0 mmHg LVOT Vmax:         146.00 cm/s LVOT Vmean:        106.000 cm/s LVOT VTI:          0.304 m LVOT/AV VTI ratio: 0.81  AORTA Ao Root diam: 3.80 cm  MITRAL VALVE MV Area (PHT): 3.48 cm     SHUNTS MV Decel Time: 218 msec     Systemic VTI:  0.30 m MV E velocity: 112.00 cm/s  Systemic Diam: 2.00 cm MV A velocity: 72.40 cm/s MV E/A ratio:  1.55  Vishnu Priya Mallipeddi Electronically signed by Winfield Rast Mallipeddi Signature Date/Time: 09/20/2022/8:01:35 PM    Final             Assessment & Plan    1.  Chronic diastolic CHF: -Most recent EF completed 09/20/2022 showing hyperdynamic EF with no RWMA and severe concentric LVH with grade 2 DD and moderately dilated LA/RA -Today patient reports no increase shortness of breath and improvement with lower extremity edema. -He has trace lower extremity edema bilaterally today and is abstaining from excess salt and watching his water intake. -Continue torsemide 40 mg daily with as needed 20 mg for increased shortness of breath and 2 to 3 pound weight gain in 24 hours.   2.  Coronary artery disease: -LHC performed 2019 that revealed no significant CAD. -Today patient reports no chest pain or discomfort since previous hospitalization. -Continue current GDMT with ASA 81 mg, Imdur 30 mg, Bystolic 20 mg, and Crestor 40 mg daily -Continue spironolactone 12.5 mg and hydralazine 100 mg 3 times daily -Low sodium diet, fluid restriction <2L, and daily weights encouraged. Educated to contact our office for weight gain of 2 lbs overnight or 5 lbs in one week.  3.  Essential hypertension: -Patient blood pressure today was well-controlled at 130/58 -Continue Bystolic 20 mg, Aldactone 12.5 mg, Apresoline 100 mg 3 times daily   4.  DM type II: -Patient's  hemoglobin A1c was 7.3 -Continue current glycemic treatment per PCP   5.  Morbid obesity: -Patient's BMI is 41.58 -Patient advised to continue moderate walking  program and decreasing calorie intake     Disposition: Follow-up with Lance Muss, MD or APP in 12 months    Medication Adjustments/Labs and Tests Ordered: Current medicines are reviewed at length with the patient today.  Concerns regarding medicines are outlined above.   Signed, Napoleon Form, Leodis Rains, NP 10/04/2022, 12:13 PM [facility]  Note:  This document was prepared using Dragon voice recognition software and may include unintentional dictation errors.

## 2022-10-04 ENCOUNTER — Encounter: Payer: Self-pay | Admitting: Nurse Practitioner

## 2022-10-04 ENCOUNTER — Ambulatory Visit: Payer: Medicare Other | Attending: Nurse Practitioner | Admitting: Nurse Practitioner

## 2022-10-04 VITALS — BP 130/58 | HR 59 | Ht 66.0 in | Wt 257.6 lb

## 2022-10-04 DIAGNOSIS — J441 Chronic obstructive pulmonary disease with (acute) exacerbation: Secondary | ICD-10-CM | POA: Diagnosis not present

## 2022-10-04 DIAGNOSIS — I5033 Acute on chronic diastolic (congestive) heart failure: Secondary | ICD-10-CM

## 2022-10-04 DIAGNOSIS — I10 Essential (primary) hypertension: Secondary | ICD-10-CM

## 2022-10-04 DIAGNOSIS — E119 Type 2 diabetes mellitus without complications: Secondary | ICD-10-CM

## 2022-10-04 DIAGNOSIS — I2510 Atherosclerotic heart disease of native coronary artery without angina pectoris: Secondary | ICD-10-CM

## 2022-10-04 MED ORDER — TORSEMIDE 20 MG PO TABS: 20.0000 mg | ORAL_TABLET | ORAL | 1 refills | Status: DC | PRN

## 2022-10-04 NOTE — Patient Instructions (Addendum)
Medication Instructions:  START Torsemide '20mg'$  Take as needed for shortness of breath or increased weight  *If you need a refill on your cardiac medications before your next appointment, please call your pharmacy*   Lab Work: None ordered If you have labs (blood work) drawn today and your tests are completely normal, you will receive your results only by: [place] (if you have MyChart) OR A paper copy in the mail If you have any lab test that is abnormal or we need to change your treatment, we will call you to review the results.   Testing/Procedures: None ordered   Follow-Up: At [facility], you and your health needs are our priority.  As part of our continuing mission to provide you with exceptional heart care, we have created designated Provider Care Teams.  These Care Teams include your primary Cardiologist (physician) and Advanced Practice Providers (APPs -  Physician Assistants and Nurse Practitioners) who all work together to provide you with the care you need, when you need it.  We recommend signing up for the patient portal called "MyChart".  Sign up information is provided on this After Visit Summary.  MyChart is used to connect with patients for Virtual Visits (Telemedicine).  Patients are able to view lab/test results, encounter notes, upcoming appointments, etc.  Non-urgent messages can be sent to your provider as well.   To learn more about what you can do with MyChart, go to [email or web address].    Your next appointment:   12 month(s)  Provider:   Larae Grooms, MD     Other Instructions  Please contact the office if you gain more than 3lbs in a day or 5lbs in a week. Limit your salt intake to 1500-'2000mg'$  per day or '500mg'$  of Sodium per meal.

## 2023-02-15 ENCOUNTER — Telehealth: Payer: Self-pay

## 2023-02-15 MED ORDER — NITROGLYCERIN 0.4 MG SL SUBL: 0.4000 mg | SUBLINGUAL_TABLET | SUBLINGUAL | 3 refills | Status: AC | PRN

## 2023-02-15 NOTE — Telephone Encounter (Signed)
Left message for the patient to contact the office to schedule an appointment to discuss monitor results.

## 2023-02-15 NOTE — Telephone Encounter (Signed)
-----   Message from Napoleon Form, Leodis Rains sent at 02/13/2023  5:10 PM EDT ----- It looks like this may have been ordered by his PCP.  Please contact patient to see if he has any questions and scheduled for a follow-up visit to discuss results.  Thank you Alden Server ----- Message ----- From: Alveta Heimlich, CMA Sent: 02/13/2023  11:51 AM EDT To: Gaston Islam., NP  Hey did you order this monitor? ----- Message ----- From: Myrtha Mantis Sent: 02/08/2023   3:30 PM EDT To: Gaston Islam., NP; Alveta Heimlich, CMA

## 2023-02-15 NOTE — Telephone Encounter (Signed)
Per Robin Searing, NP  Please advise patient to go to the ED if he continues to experiences chest pain and tightness.  I think he should be seen sooner due to these new symptoms.  Please schedule him with an available provider this week or next week.  Please prescribe Nitrostat 0.4 mg and advised patient to continue to weigh daily and only use torsemide if weight increases by 2 pounds in 24 hours or 5 pounds in 1 week.  Please let me know if have any further questions.

## 2023-02-15 NOTE — Telephone Encounter (Signed)
Spoke with the patient who states he is doing fine and he does not need to go to the emergency room. He states he missed two doses of his torsemide and that's what caused he chest pain and tightness. He states he has not missed any more doses and has doses since then. He states he is doing fine and will wait until Alden Server is back in the office to be seen. I offered multiple times to bring him in sooner and he declined.   Rx for nitroglycerin has been sent to the pharmacy. I explained to the patient the nitrostat instructions. He voiced understanding.  Advised the patient to contact the office or go to the emergency room if he has nay chest pain. He voiced understanding.

## 2023-02-15 NOTE — Telephone Encounter (Signed)
Patient called back and was given an appointment with Alden Server. Patient agreeable.   Patient then states he was going to contact the office anyway to schedule an appointment with Alden Server because he was having some chest pain and tightness. He states he took the extra dose of Torsemide and it helped him feel better.he states he only had this one episode yesterday. He states his new pcp told him not to take the extra torsemide because it could make his blood pressure drop to low but he states his heart doctor told him to so he was doing what the heart doctor said.   Advised the patient that I would speak with Alden Server and get back to him if he had any additional recommendations. I did advise the patient that Alden Server is on vacation so it amy take a day or two for me to get back to him. He voiced understanding.

## 2023-03-08 NOTE — Progress Notes (Signed)
Office Visit    Patient Name: Vincent Ramos Fullbright Date of Encounter: 03/10/2023  Primary Care Provider:  Karenann Cai, NP Primary Cardiologist:  Lance Muss, MD Primary Electrophysiologist: None   Past Medical History    Past Medical History:  Diagnosis Date   Abnormal nuclear cardiac imaging test 12/27/2017   Acute on chronic diastolic CHF (congestive heart failure) (HCC) 09/17/2017   Acute respiratory failure with hypoxia (HCC) 06/01/2017   Arthritis    Asthma    CAD (coronary artery disease)    a. 11/2017: cath showing widely patent coronary arteries with normal left main, 30% mid LAD, luminal irregularities in the proximal and mid circumflex, and 30 to 40% stenosis in the mid RCA.   CAP (community acquired pneumonia) 07/31/2014   Community acquired pneumonia 07/31/2014   Constipation 02/06/2018   COPD exacerbation (HCC) 09/17/2017   Dysphagia    Elevated troponin 09/17/2017   Enlarged prostate    Grade II diastolic dysfunction    Hypertension    Lumbar stenosis 10/07/2015   Obesity 07/31/2014   PNA (pneumonia) 05/30/2017   Pneumonia    Pneumonia    Polyp of rectum    Respiratory failure (HCC) 09/18/2017   SBO (small bowel obstruction) (HCC) 05/08/2019   Shortness of breath    Sleep apnea    Sleep Study 08/21/17: Severe OSA   Type 2 diabetes mellitus (HCC)    Past Surgical History:  Procedure Laterality Date   ANTERIOR CERVICAL DECOMP/DISCECTOMY FUSION N/A 09/17/2020   Procedure: Anterior Cevrvical Decompresion / Discectomy Fusion Cervical four-five,Cervical five-six,Cervical six- seven;  Surgeon: Bedelia Person, MD;  Location: [facility] OR;  Service: Neurosurgery;  Laterality: N/A;   CIRCUMCISION     COLONOSCOPY WITH PROPOFOL N/A 08/08/2017   Procedure: COLONOSCOPY WITH PROPOFOL;  Surgeon: West Bali, MD;  Location: [facility] SUITE;  Service: Endoscopy;  Laterality: N/A;  1:00pm   ESOPHAGEAL DILATION N/A 06/07/2017   Procedure: ESOPHAGEAL DILATION;  Surgeon: Corbin Ade, MD;  Location: [facility]ITE;  Service: Endoscopy;  Laterality: N/A;   ESOPHAGOGASTRODUODENOSCOPY (EGD) WITH PROPOFOL N/A 06/07/2017   Procedure: ESOPHAGOGASTRODUODENOSCOPY (EGD) WITH PROPOFOL;  Surgeon: Corbin Ade, MD;  Location: [facility];  Service: Endoscopy;  Laterality: N/A;   HYDROCELE EXCISION  10/14/2011   Procedure: HYDROCELECTOMY ADULT;  Surgeon: Anner Crete, MD;  Location: [facility] ORS;  Service: Urology;  Laterality: Right;   LACERATION REPAIR     left hand   LUMBAR LAMINECTOMY/DECOMPRESSION MICRODISCECTOMY Right 10/07/2015   Procedure: Right Lumbar four-five ,lumbar five sacral-one Laminectomy;  Surgeon: Hilda Lias, MD;  Location: [facility] NEURO ORS;  Service: Neurosurgery;  Laterality: Right;   RIGHT/LEFT HEART CATH AND CORONARY ANGIOGRAPHY N/A 12/27/2017   Procedure: RIGHT/LEFT HEART CATH AND CORONARY ANGIOGRAPHY;  Surgeon: Lyn Records, MD;  Location: [facility] INVASIVE CV LAB;  Service: Cardiovascular;  Laterality: N/A;    Allergies  Allergies  Allergen Reactions   Amlodipine     Felt "jittery" constantly   Novolog [Insulin Aspart] Hives, Itching and Rash   Victoza [Liraglutide] Itching and Rash     History of Present Illness    Vincent Ramos is a 72 y.o. male with PMH of diastolic CHF, HTN, COPD, HLD, DM type II, morbid obesity, nonobstructive CAD s/p LHC who presents today for follow-up to discuss monitor results.  Mr. Walther was seen initially by Dr. Eden Emms in 2019 for management of HTN.  He underwent 2D echo that showed EF of 60 to  65% and Myoview was completed and inferior defect and partial reversible EF of 45%.He underwent LHC by Dr. Katrinka Blazing that showed nonobstructive CAD in 11/2017 widely patent coronary arteries.  He has known compliance issues with managing his BP and self reduced his hydralazine due to headaches.  He also previously stopped his statins due to myalgias and was taking Bystolic as needed for high blood pressures.  He was referred to the Pharm.D.  05/2020 and was encouraged to increase physical activity and continued on Bystolic, spironolactone, olmesartan/HCTZ, Imdur, diltiazem, hydralazine.  He was last seen by Dr. Eldridge Dace on 08/20/2021 and blood pressure was reasonably controlled with systolics in the 130s.  Diltiazem was increased to 360 mg daily and patient was advised to continue to lose weight.  He was advised also to follow-up with hypertension clinic 2D echo was completed 09/2022 with hyperdynamic EF of 70-75% and severe concentric LVH with moderately increased RV wall thickness and moderately dilated LA/RA and no valvular abnormalities noted.Vincent Ramos  He was last seen 10/04/2022 and BP was controlled at 130/58.  He was euvolemic with trace lower extremity edema.  Patient was seen by PCP at [facility] and had ZIO monitor placed on 6/25 for 3 days that showed predominantly sinus rhythm with first-degree AVB and 3 episodes of SVT lasting 5 beats.  He contacted our office on 02/15/2023 with complaint of chest pain tightness and was advised to go to the ED for further evaluation.  He reported missing 2 doses of torsemide that may have attributed to symptoms.  He was given a prescription for Nitrostat and advised to contact our office for further evaluation.  Since last being seen in the office patient reports that he has been experiencing shortness of breath and occasional episodes of chest pain.  Patient's blood pressure today is controlled at 122/50 and heart rate is 73 bpm.  He reports compliance with his current medications and denies any adverse reactions.  He is volume up on examination today with +1 bilateral pitting edema.  He is weight has increased 10 pounds since his previous visit in March of this year.  He reports indiscretion with salt by eating out more frequently but advises that he no longer uses salt on his food.  He has been sleeping in a recliner due to inability to lay flat.  He also endorses palpitations that occur with minimal  exertion.  He notes that his palpitations resolved spontaneously with rest.  He was recently seen by his PCP at [facility] and was given a 3-day ZIO monitor that showed nonsustained SVT with no malignant arrhythmias or pauses.  Patient denies chest pain, palpitations, dyspnea, PND, orthopnea, nausea, vomiting, dizziness, syncope, edema, weight gain, or early satiety.  Home Medications    Current Outpatient Medications  Medication Sig Dispense Refill   albuterol (VENTOLIN HFA) 108 (90 Base) MCG/ACT inhaler Inhale 2 puffs into the lungs every 6 (six) hours as needed for wheezing or shortness of breath.     aspirin EC 81 MG tablet Take 1 tablet (81 mg total) by mouth daily with breakfast. 30 tablet 4   azelastine (ASTELIN) 0.1 % nasal spray Place 2 sprays into both nostrils 2 (two) times daily. Use in each nostril as directed 30 mL 12   gabapentin (NEURONTIN) 300 MG capsule Take 300 mg by mouth 2 (two) times daily.     glipiZIDE (GLUCOTROL) 10 MG tablet Take 10 mg by mouth 2 (two) times daily.     hydrALAZINE (APRESOLINE)  100 MG tablet Take 100 mg by mouth daily at 12 noon. Pt takes 1 tablet by mouth daily.     isosorbide mononitrate (IMDUR) 30 MG 24 hr tablet Take 1 tablet (30 mg total) by mouth daily. 30 tablet 5   montelukast (SINGULAIR) 10 MG tablet Take 10 mg by mouth daily.     Nebivolol HCl 20 MG TABS Take 1 tablet by mouth daily at 12 noon.     nitroGLYCERIN (NITROSTAT) 0.4 MG SL tablet Place 1 tablet (0.4 mg total) under the tongue every 5 (five) minutes as needed. 25 tablet 3   olmesartan-hydrochlorothiazide (BENICAR HCT) 40-25 MG tablet Take 1 tablet by mouth daily.     polyethylene glycol (MIRALAX / GLYCOLAX) 17 g packet Take 17 g by mouth daily. (Patient taking differently: Take 17 g by mouth daily as needed for moderate constipation.) 30 each 2   Potassium Chloride ER 20 MEQ TBCR Take 1 tablet by mouth daily.     rosuvastatin (CRESTOR) 40 MG tablet Take 40 mg by mouth daily.      tamsulosin (FLOMAX) 0.4 MG CAPS capsule Take 0.4 mg by mouth daily.     torsemide (DEMADEX) 20 MG tablet Take 20 mg by mouth as needed. Pt takes 2 tablet 40 mg as needed.     TRESIBA FLEXTOUCH 100 UNIT/ML FlexTouch Pen Inject 80 Units into the skin daily.     TRULICITY 1.5 MG/0.5ML SOPN Inject 1.5 mg into the skin every Sunday.     No current facility-administered medications for this visit.     Review of Systems  Please see the history of present illness.    (+) Chest pain (+) Lower extremity edema, palpitations  All other systems reviewed and are otherwise negative except as noted above.  Physical Exam    Wt Readings from Last 3 Encounters:  03/10/23 267 lb 3.2 oz (121.2 kg)  10/04/22 257 lb 9.6 oz (116.8 kg)  09/23/22 256 lb 11.2 oz (116.4 kg)   VS: Vitals:   03/10/23 1035  BP: (!) 122/50  Pulse: 73  SpO2: 94%  ,Body mass index is 43.13 kg/m.  Constitutional:      Appearance: Healthy appearance. Not in distress.  Neck:     Vascular: JVD normal.  Pulmonary:     Effort: Pulmonary effort is normal.     Breath sounds: No wheezing. No rales. Diminished in the bases Cardiovascular:     Normal rate. Regular rhythm. Normal S1. Normal S2.      Murmurs: There is no murmur.  Edema:    Peripheral edema absent.  Abdominal:     Palpations: Abdomen is soft non tender. There is no hepatomegaly.  Skin:    General: Skin is warm and dry.  Neurological:     General: No focal deficit present.     Mental Status: Alert and oriented to person, place and time.     Cranial Nerves: Cranial nerves are intact.  EKG/LABS/ Recent Cardiac Studies    ECG personally reviewed by me today -none completed today  Lab Results  Component Value Date   WBC 8.2 09/23/2022   HGB 15.6 09/23/2022   HCT 50.2 09/23/2022   MCV 85.1 09/23/2022   PLT 177 09/23/2022   Lab Results  Component Value Date   CREATININE 1.05 09/23/2022   BUN 20 09/23/2022   NA 137 09/23/2022   K 3.3 (L) 09/23/2022   CL  95 (L) 09/23/2022   CO2 35 (H) 09/23/2022   Lab Results  Component Value Date   ALT 24 09/19/2022   AST 21 09/19/2022   ALKPHOS 84 09/19/2022   BILITOT 1.4 (H) 09/19/2022   Lab Results  Component Value Date   CHOL 193 01/04/2013   HDL 45 01/04/2013   LDLCALC 122 (H) 01/04/2013   TRIG 129 01/04/2013   CHOLHDL 4.3 01/04/2013    Lab Results  Component Value Date   HGBA1C 7.3 (H) 09/19/2022     Assessment & Plan    1.  Chest pain: -Today patient reports chest pain occurs with minimal activity and is relieved spontaneously with rest. -Will increase his Imdur to 60 mg daily -Patient will complete Lexiscan Myoview to rule out possible ischemia related to new episodes of chest pain. -Patient was advised to seek care in the ED if chest pain is not relieved with as needed Nitrostat  2.Chronic diastolic CHF: -Most recent EF completed 09/20/2022 showing hyperdynamic EF with no RWMA and severe concentric LVH with grade 2 DD and moderately dilated LA/RA -Today patient is volume up with a 10 pound weight gain since his previous visit in March. -He reports indiscretions with salt increase torsemide to 60 mg x 3 days and then 20 mg daily. -Will increase potassium to 20 mEq twice daily x 3 days and then 20 mEq daily -Continue hydralazine 100 mg daily, olmesartan/HCTZ 40-25 mg Low sodium diet, fluid restriction <2L, and daily weights encouraged. Educated to contact our office for weight gain of 2 lbs overnight or 5 lbs in one week.   3.Coronary artery disease: -LHC performed 2019 that revealed no significant CAD. -Today patient reports episodes of chest pain that have occurred with and without activity. -Please see #1 under plan -Continue GDMT with Crestor 40 mg daily ASA 81 Imdur 60 mg daily  4.Essential hypertension: -Patient blood pressure today was well-controlled at 122/50 -Continue olmesartan/HCTZ 40-25 mg daily nebivolol for 5 mg twice daily, hydralazine 100 mg daily   5. DM  type II: -Patient's hemoglobin A1c was 7.3 above goal of less than 7 -Continue treatment plan per PCP  6. Morbid obesity: -Patient's BMI is 43.13 kg/m -She was advised to increase physical activity to at least 150 minutes/week and modify his calorie intake  7.  Palpitations: -Patient recently wore 3-day ZIO monitor by PCP that showed nonsustained SVT with no malignant arrhythmias or pauses. -Patient will increase nebivolol to 20 mg twice daily  Disposition: Follow-up with Lance Muss, MD or APP in 2 months Informed Consent   Shared Decision Making/Informed Consent The risks [chest pain, shortness of breath, cardiac arrhythmias, dizziness, blood pressure fluctuations, myocardial infarction, stroke/transient ischemic attack, nausea, vomiting, allergic reaction, radiation exposure, metallic taste sensation and life-threatening complications (estimated to be 1 in 10,000)], benefits (risk stratification, diagnosing coronary artery disease, treatment guidance) and alternatives of a nuclear stress test were discussed in detail with Mr. Gwilliam and he agrees to proceed.    +  Medication Adjustments/Labs and Tests Ordered: Current medicines are reviewed at length with the patient today.  Concerns regarding medicines are outlined above.   Signed, Napoleon Form, Leodis Rains, NP 03/10/2023, 11:31 AM [facility]

## 2023-03-10 ENCOUNTER — Other Ambulatory Visit: Payer: Self-pay | Admitting: Nurse Practitioner

## 2023-03-10 ENCOUNTER — Encounter: Payer: Self-pay | Admitting: Nurse Practitioner

## 2023-03-10 ENCOUNTER — Ambulatory Visit: Payer: Medicare Other | Attending: Nurse Practitioner | Admitting: Nurse Practitioner

## 2023-03-10 VITALS — BP 122/50 | HR 73 | Ht 66.0 in | Wt 267.2 lb

## 2023-03-10 DIAGNOSIS — I5189 Other ill-defined heart diseases: Secondary | ICD-10-CM

## 2023-03-10 DIAGNOSIS — I2510 Atherosclerotic heart disease of native coronary artery without angina pectoris: Secondary | ICD-10-CM

## 2023-03-10 DIAGNOSIS — I10 Essential (primary) hypertension: Secondary | ICD-10-CM | POA: Diagnosis not present

## 2023-03-10 DIAGNOSIS — R079 Chest pain, unspecified: Secondary | ICD-10-CM | POA: Diagnosis not present

## 2023-03-10 DIAGNOSIS — I5033 Acute on chronic diastolic (congestive) heart failure: Secondary | ICD-10-CM

## 2023-03-10 DIAGNOSIS — R002 Palpitations: Secondary | ICD-10-CM

## 2023-03-10 DIAGNOSIS — E119 Type 2 diabetes mellitus without complications: Secondary | ICD-10-CM

## 2023-03-10 DIAGNOSIS — Z7984 Long term (current) use of oral hypoglycemic drugs: Secondary | ICD-10-CM

## 2023-03-10 LAB — BASIC METABOLIC PANEL
BUN/Creatinine Ratio: 12 (ref 10–24)
BUN: 15 mg/dL (ref 8–27)
CO2: 24 mmol/L (ref 20–29)
Calcium: 9 mg/dL (ref 8.6–10.2)
Chloride: 103 mmol/L (ref 96–106)
Creatinine, Ser: 1.22 mg/dL (ref 0.76–1.27)
Glucose: 158 mg/dL — ABNORMAL HIGH (ref 70–99)
Potassium: 4 mmol/L (ref 3.5–5.2)
Sodium: 140 mmol/L (ref 134–144)
eGFR: 64 mL/min/{1.73_m2} (ref >59)

## 2023-03-10 LAB — PRO B NATRIURETIC PEPTIDE

## 2023-03-10 MED ORDER — ISOSORBIDE MONONITRATE ER 60 MG PO TB24: 60.0000 mg | ORAL_TABLET | Freq: Every day | ORAL | 3 refills | Status: DC

## 2023-03-10 MED ORDER — NEBIVOLOL HCL 20 MG PO TABS: 1.0000 | ORAL_TABLET | Freq: Two times a day (BID) | ORAL | 3 refills | Status: DC

## 2023-03-10 MED ORDER — TORSEMIDE 20 MG PO TABS: 20.0000 mg | ORAL_TABLET | Freq: Every day | ORAL | 1 refills | Status: DC

## 2023-03-10 NOTE — Patient Instructions (Addendum)
Medication Instructions:  INCREASE Imdur to 60mg  Take 1 tablet once a day  INCREASE Bystolic to 1 tablet twice a day  INCREASE Torsemide to 40mg  in the morning and 20mg  in the evening X 3 days; THEN go back 20mg  once a day and can take and can take an additional tablet as needed. INCREASE Potassium to twice a day for 3 days then go back to once a day *If you need a refill on your cardiac medications before your next appointment, please call your pharmacy*   Lab Work: TODAY-BMET & BNP If you have labs (blood work) drawn today and your tests are completely normal, you will receive your results only by: MyChart Message (if you have MyChart) OR A paper copy in the mail If you have any lab test that is abnormal or we need to change your treatment, we will call you to review the results.   Testing/Procedures: Your physician has requested that you have a lexiscan myoview. For further information please visit [email or web address] Please follow instruction sheet, as given.   Follow-Up: At [facility], you and your health needs are our priority.  As part of our continuing mission to provide you with exceptional heart care, we have created designated Provider Care Teams.  These Care Teams include your primary Cardiologist (physician) and Advanced Practice Providers (APPs -  Physician Assistants and Nurse Practitioners) who all work together to provide you with the care you need, when you need it.  We recommend signing up for the patient portal called "MyChart".  Sign up information is provided on this After Visit Summary.  MyChart is used to connect with patients for Virtual Visits (Telemedicine).  Patients are able to view lab/test results, encounter notes, upcoming appointments, etc.  Non-urgent messages can be sent to your provider as well.   To learn more about what you can do with MyChart, go to [email or web address].    Your next appointment:   2 month(s)  Provider:   Lance Muss, MD  or Robin Searing, NP   Other Instructions Check your blood pressure daily for 2 weeks, then contact the office with your readings.  Make sure to check 2 hours after your medications.   AVOID these things for 30 minutes before checking your blood pressure: No Drinking caffeine. No Drinking alcohol. No Eating. No Smoking. No Exercising.  Five minutes before checking your blood pressure: Pee. Sit in a dining chair. Avoid sitting in a soft couch or armchair. Be quiet. Do not talk.  CHECK YOUR WEIGHT DAILY Please contact the office if you gain more than 3lbs in a day or 5lbs in a week.  Limit your salt intake to 1500-2000mg  per day or 500mg  of Sodium per meal.

## 2023-03-16 ENCOUNTER — Telehealth (HOSPITAL_COMMUNITY): Payer: Self-pay

## 2023-03-16 NOTE — Telephone Encounter (Signed)
Spoke with the patient, detailed instructions given. He stated that he would be here for his test. Asked to call back with any questions. S.Williams EMTP/CCT 

## 2023-03-21 ENCOUNTER — Ambulatory Visit (HOSPITAL_COMMUNITY): Payer: Medicare Other | Attending: Nurse Practitioner

## 2023-03-21 DIAGNOSIS — R079 Chest pain, unspecified: Secondary | ICD-10-CM | POA: Diagnosis present

## 2023-03-21 LAB — MYOCARDIAL PERFUSION IMAGING
Base ST Depression (mm): 0 mm
LV dias vol: 146 mL (ref 62–150)
LV sys vol: 71 mL
Nuc Stress EF: 51 %
Peak HR: 60 {beats}/min
Rest HR: 4582 {beats}/min
Rest Nuclear Isotope Dose: 10.8 mCi
SDS: 0
SRS: 0
SSS: 0
ST Depression (mm): 0 mm
Stress Nuclear Isotope Dose: 31.9 mCi
TID: 1.1

## 2023-03-21 MED ORDER — TECHNETIUM TC 99M TETROFOSMIN IV KIT
10.8000 | PACK | Freq: Once | INTRAVENOUS | Status: AC | PRN
  Administered 2023-03-21: 10.8 via INTRAVENOUS

## 2023-03-21 MED ORDER — REGADENOSON 0.4 MG/5ML IV SOLN
0.4000 mg | Freq: Once | INTRAVENOUS | Status: AC
Start: 2023-03-21 — End: 2023-03-21
  Administered 2023-03-21: 0.4 mg via INTRAVENOUS

## 2023-03-21 MED ORDER — TECHNETIUM TC 99M TETROFOSMIN IV KIT
31.9000 | PACK | Freq: Once | INTRAVENOUS | Status: AC | PRN
  Administered 2023-03-21: 31.9 via INTRAVENOUS

## 2023-03-27 ENCOUNTER — Telehealth: Payer: Self-pay

## 2023-03-27 DIAGNOSIS — J441 Chronic obstructive pulmonary disease with (acute) exacerbation: Secondary | ICD-10-CM

## 2023-03-27 NOTE — Telephone Encounter (Signed)
Spoke with patient and gave him stress test results.   The patient had multiple questions. He states if his stress test is normal then why is he short of breath? The patient then asked why is he taking Isosorbide 60mg . Then the patient stated his heart rate dropped to the low 40s during his stress test and he wants to know if he can stop the Bystolic. He states he had some chest tightness that went away after taking torsemide.   He states he used to see a pulmonologist a while ago and wonders if he needs to follow up with them about his breathing. Patient states sometimes when he walks around he gets short of breath and he does not know why. The the patient asked why is he taking Crestor.   I explained to the patient the purpose of him taking Imdur, bystolic and crestor. Patient seem to understand but then asked why he is taking the medication.  The patient asked the same caliber of questions multiple times.   I advised the patient that I would speak with Alden Server and get back to him he voiced understanding.

## 2023-03-29 NOTE — Telephone Encounter (Signed)
Pt called to f/u on him hearing something back after speaking with CMA on 8/26. He stated he was told he'd get a callback but he hasn't and he doesn't know what to do. Please advise

## 2023-03-29 NOTE — Telephone Encounter (Signed)
   Vincent Ramos., NP  Vincent Ramos days ago    Please let Vincent Ramos know that he is taking isosorbide due to chest pain.  If his blood pressures are running low he can reduce his dose to 30 mg daily.  The results of your stress test were reassuring that your shortness of breath is not related to any new ischemic changes.  I recommend that he follow-up with a pulmonologist to monitor his COPD.  Please advise him to monitor his heart rate for 1 week and if remaining less than 60 we will reduce nebivolol to 20 mg.  Please let him know that his shortness of breath will be improved with torsemide and also is related to deconditioning and COPD.  Please advise him to abstain from excess salt and make sure to weigh daily.  Please let me know if you have any further questions.  Robin Searing, NP

## 2023-03-29 NOTE — Telephone Encounter (Signed)
Patient notified of Ernest's recommendations and voiced understanding. Patient states he is going to get a watch to check his heart rate. I advised the patient to record his readings through out the day and let us know in one week what his readings are.   Patient requested we put in a new referral to pulmonology to an office in [place]. He states he was not happy with the provider in [place]. I advised the patient that I would speak with Alden Server and get back to him.   Patient agreeable and voiced understanding.

## 2023-03-30 NOTE — Telephone Encounter (Signed)
Left message informing patient that we have placed a referral to a pulmonologist in [place]. Advised the patient to contact the office with any questions or concerns.

## 2023-03-30 NOTE — Telephone Encounter (Signed)
Gaston Islam., NP  You13 hours ago (6:13 PM)    Yes you can place referral to pulmonology.

## 2023-03-30 NOTE — Telephone Encounter (Signed)
Patient called the office and back and was informed that the pulmonology referral has been placed. Patient agreeable and voiced understanding.

## 2023-05-05 ENCOUNTER — Encounter: Payer: Self-pay | Admitting: *Deleted

## 2023-05-10 NOTE — Progress Notes (Unsigned)
Cardiology Office Note    Patient Name: Vincent Ramos Date of Encounter: 05/10/2023  Primary Care Provider:  Karenann Cai, NP Primary Cardiologist:  Lance Muss, MD Primary Electrophysiologist: None   Past Medical History    Past Medical History:  Diagnosis Date   Abnormal nuclear cardiac imaging test 12/27/2017   Acute on chronic diastolic CHF (congestive heart failure) (HCC) 09/17/2017   Acute respiratory failure with hypoxia (HCC) 06/01/2017   Arthritis    Asthma    CAD (coronary artery disease)    a. 11/2017: cath showing widely patent coronary arteries with normal left main, 30% mid LAD, luminal irregularities in the proximal and mid circumflex, and 30 to 40% stenosis in the mid RCA.   CAP (community acquired pneumonia) 07/31/2014   Community acquired pneumonia 07/31/2014   Constipation 02/06/2018   COPD exacerbation (HCC) 09/17/2017   Dysphagia    Elevated troponin 09/17/2017   Enlarged prostate    Grade II diastolic dysfunction    Hypertension    Lumbar stenosis 10/07/2015   Obesity 07/31/2014   PNA (pneumonia) 05/30/2017   Pneumonia    Pneumonia    Polyp of rectum    Respiratory failure (HCC) 09/18/2017   SBO (small bowel obstruction) (HCC) 05/08/2019   Shortness of breath    Sleep apnea    Sleep Study 08/21/17: Severe OSA   Type 2 diabetes mellitus (HCC)     History of Present Illness  Vincent Ramos is a 72 y.o. male with PMH of diastolic CHF, HTN, COPD, HLD, DM type II, morbid obesity, nonobstructive CAD s/p LHC who presents today for follow-up 39-month follow-up.  Vincent Ramos was last seen on 03/10/2023 to review event monitor and reported bouts of shortness of breath and occasional episodes of chest pain.  He was also found to be volume up on examination with +1 bilateral pitting edema.  He underwent a Lexiscan Myoview that was low risk and normal and Imdur was increased to 60 mg daily.  He also creased torsemide for 3 days 60 mg and return to 20 mg daily  he continued to experience palpitations and nebivolol was increased to 20 mg twice daily.  He continued to experience shortness of breath and referral was placed to pulmonology for further evaluation.  If chest discomfort persist with increase in nitrates we will discuss with DOD to see if repeat LHC is recommended.   During today's visit the patient reports*** .  Patient denies chest pain, palpitations, dyspnea, PND, orthopnea, nausea, vomiting, dizziness, syncope, edema, weight gain, or early satiety.  ***Notes: -Last ischemic evaluation: -Last echo: -Interim ED visits: Review of Systems  Please see the history of present illness.    All other systems reviewed and are otherwise negative except as noted above.  Physical Exam    Wt Readings from Last 3 Encounters:  03/21/23 267 lb (121.1 kg)  03/10/23 267 lb 3.2 oz (121.2 kg)  10/04/22 257 lb 9.6 oz (116.8 kg)   UU:VOZDG were no vitals filed for this visit.,There is no height or weight on file to calculate BMI. GEN: Well nourished, well developed in no acute distress Neck: No JVD; No carotid bruits Pulmonary: Clear to auscultation without rales, wheezing or rhonchi  Cardiovascular: Normal rate. Regular rhythm. Normal S1. Normal S2.   Murmurs: There is no murmur.  ABDOMEN: Soft, non-tender, non-distended EXTREMITIES:  No edema; No deformity   EKG/LABS/ Recent Cardiac Studies   ECG personally reviewed by me today - ***  Risk Assessment/Calculations:   {  Does this patient have ATRIAL FIBRILLATION?:[id number]}      Lab Results  Component Value Date   WBC 8.2 09/23/2022   HGB 15.6 09/23/2022   HCT 50.2 09/23/2022   MCV 85.1 09/23/2022   PLT 177 09/23/2022   Lab Results  Component Value Date   CREATININE 1.22 03/10/2023   BUN 15 03/10/2023   NA 140 03/10/2023   K 4.0 03/10/2023   CL 103 03/10/2023   CO2 24 03/10/2023   Lab Results  Component Value Date   CHOL 193 01/04/2013   HDL 45 01/04/2013   LDLCALC 122 (H)  01/04/2013   TRIG 129 01/04/2013   CHOLHDL 4.3 01/04/2013    Lab Results  Component Value Date   HGBA1C 7.3 (H) 09/19/2022   Assessment & Plan    1.Chronic diastolic CHF: -Most recent EF completed 09/20/2022 showing hyperdynamic EF with no RWMA and severe concentric LVH with grade 2 DD and moderately dilated LA/RA  2.Coronary artery disease: -LHC performed 2019 that revealed no significant CAD. -Today patient reports episodes of chest pain that have occurred with and without activity.  3.Essential hypertension: -Patient blood pressure today was  4.DM type II: -Patient's hemoglobin A1c was   5.  Palpitations: -Today patient reports***  6.  Morbid obesity -Patient's BMI is      Disposition: Follow-up with Lance Muss, MD or APP in *** months {Are you ordering a CV Procedure (e.g. stress test, cath, DCCV, TEE, etc)?   Press F2        :[id number]}   Signed, Napoleon Form, Leodis Rains, NP 05/10/2023, 9:12 AM [facility]

## 2023-05-11 ENCOUNTER — Ambulatory Visit: Payer: Medicare Other | Attending: Nurse Practitioner | Admitting: Nurse Practitioner

## 2023-05-11 ENCOUNTER — Encounter: Payer: Self-pay | Admitting: Nurse Practitioner

## 2023-05-11 VITALS — BP 108/62 | HR 73 | Ht 66.0 in | Wt 267.8 lb

## 2023-05-11 DIAGNOSIS — I2510 Atherosclerotic heart disease of native coronary artery without angina pectoris: Secondary | ICD-10-CM

## 2023-05-11 DIAGNOSIS — E119 Type 2 diabetes mellitus without complications: Secondary | ICD-10-CM | POA: Diagnosis not present

## 2023-05-11 DIAGNOSIS — I10 Essential (primary) hypertension: Secondary | ICD-10-CM

## 2023-05-11 DIAGNOSIS — R002 Palpitations: Secondary | ICD-10-CM

## 2023-05-11 DIAGNOSIS — I5189 Other ill-defined heart diseases: Secondary | ICD-10-CM

## 2023-05-11 DIAGNOSIS — I5032 Chronic diastolic (congestive) heart failure: Secondary | ICD-10-CM | POA: Diagnosis not present

## 2023-05-11 MED ORDER — NEBIVOLOL HCL 20 MG PO TABS: 1.0000 | ORAL_TABLET | Freq: Every day | ORAL | Status: DC

## 2023-05-11 MED ORDER — ISOSORBIDE MONONITRATE ER 30 MG PO TB24: 30.0000 mg | ORAL_TABLET | Freq: Every day | ORAL | Status: DC

## 2023-05-11 NOTE — Patient Instructions (Signed)
Medication Instructions:  Your physician recommends that you continue on your current medications as directed. Please refer to the Current Medication list given to you today. *If you need a refill on your cardiac medications before your next appointment, please call your pharmacy*   Lab Work: None ordered   Testing/Procedures: None Ordered   Follow-Up: At [facility], you and your health needs are our priority.  As part of our continuing mission to provide you with exceptional heart care, we have created designated Provider Care Teams.  These Care Teams include your primary Cardiologist (physician) and Advanced Practice Providers (APPs -  Physician Assistants and Nurse Practitioners) who all work together to provide you with the care you need, when you need it.  We recommend signing up for the patient portal called "MyChart".  Sign up information is provided on this After Visit Summary.  MyChart is used to connect with patients for Virtual Visits (Telemedicine).  Patients are able to view lab/test results, encounter notes, upcoming appointments, etc.  Non-urgent messages can be sent to your provider as well.   To learn more about what you can do with MyChart, go to [email or web address].    Your next appointment:   6 month(s)  Provider:   Dr Jacinto Halim Dr Odis Hollingshead Dr Rosemary Holms Other Instructions  Please check your weight daily. Please contact the office if you gain more than 2lbs in a day or 5lbs in a week.  Limit your salt intake to 1500-2000mg  per day or 500mg  of Sodium per meal.

## 2023-08-04 ENCOUNTER — Other Ambulatory Visit: Payer: Self-pay

## 2023-08-04 ENCOUNTER — Emergency Department (HOSPITAL_COMMUNITY): Payer: Medicare Other

## 2023-08-04 ENCOUNTER — Inpatient Hospital Stay (HOSPITAL_COMMUNITY)
Admission: EM | Admit: 2023-08-04 | Discharge: 2023-08-06 | DRG: 062 | Disposition: A | Discharge status: Home / Self Care | Payer: Medicare Other | Attending: Neurology | Admitting: Neurology

## 2023-08-04 DIAGNOSIS — N400 Enlarged prostate without lower urinary tract symptoms: Secondary | ICD-10-CM | POA: Diagnosis present

## 2023-08-04 DIAGNOSIS — G4733 Obstructive sleep apnea (adult) (pediatric): Secondary | ICD-10-CM | POA: Diagnosis present

## 2023-08-04 DIAGNOSIS — I110 Hypertensive heart disease with heart failure: Secondary | ICD-10-CM | POA: Diagnosis present

## 2023-08-04 DIAGNOSIS — Z9641 Presence of insulin pump (external) (internal): Secondary | ICD-10-CM | POA: Diagnosis present

## 2023-08-04 DIAGNOSIS — Z87891 Personal history of nicotine dependence: Secondary | ICD-10-CM | POA: Diagnosis not present

## 2023-08-04 DIAGNOSIS — Z7902 Long term (current) use of antithrombotics/antiplatelets: Secondary | ICD-10-CM | POA: Diagnosis not present

## 2023-08-04 DIAGNOSIS — Z809 Family history of malignant neoplasm, unspecified: Secondary | ICD-10-CM

## 2023-08-04 DIAGNOSIS — Z888 Allergy status to other drugs, medicaments and biological substances status: Secondary | ICD-10-CM

## 2023-08-04 DIAGNOSIS — H5500 Unspecified nystagmus: Secondary | ICD-10-CM | POA: Diagnosis present

## 2023-08-04 DIAGNOSIS — I5032 Chronic diastolic (congestive) heart failure: Secondary | ICD-10-CM | POA: Diagnosis present

## 2023-08-04 DIAGNOSIS — E785 Hyperlipidemia, unspecified: Secondary | ICD-10-CM | POA: Diagnosis present

## 2023-08-04 DIAGNOSIS — I6789 Other cerebrovascular disease: Principal | ICD-10-CM | POA: Diagnosis present

## 2023-08-04 DIAGNOSIS — R471 Dysarthria and anarthria: Secondary | ICD-10-CM | POA: Diagnosis present

## 2023-08-04 DIAGNOSIS — Z7982 Long term (current) use of aspirin: Secondary | ICD-10-CM | POA: Diagnosis not present

## 2023-08-04 DIAGNOSIS — Z7984 Long term (current) use of oral hypoglycemic drugs: Secondary | ICD-10-CM

## 2023-08-04 DIAGNOSIS — I639 Cerebral infarction, unspecified: Secondary | ICD-10-CM | POA: Diagnosis present

## 2023-08-04 DIAGNOSIS — Z812 Family history of tobacco abuse and dependence: Secondary | ICD-10-CM

## 2023-08-04 DIAGNOSIS — Z8249 Family history of ischemic heart disease and other diseases of the circulatory system: Secondary | ICD-10-CM

## 2023-08-04 DIAGNOSIS — G459 Transient cerebral ischemic attack, unspecified: Secondary | ICD-10-CM | POA: Diagnosis not present

## 2023-08-04 DIAGNOSIS — Z860100 Personal history of colon polyps, unspecified: Secondary | ICD-10-CM

## 2023-08-04 DIAGNOSIS — E1165 Type 2 diabetes mellitus with hyperglycemia: Secondary | ICD-10-CM | POA: Diagnosis present

## 2023-08-04 DIAGNOSIS — Z7985 Long-term (current) use of injectable non-insulin antidiabetic drugs: Secondary | ICD-10-CM

## 2023-08-04 DIAGNOSIS — H532 Diplopia: Principal | ICD-10-CM

## 2023-08-04 DIAGNOSIS — E669 Obesity, unspecified: Secondary | ICD-10-CM | POA: Diagnosis present

## 2023-08-04 DIAGNOSIS — Z8701 Personal history of pneumonia (recurrent): Secondary | ICD-10-CM

## 2023-08-04 DIAGNOSIS — R270 Ataxia, unspecified: Secondary | ICD-10-CM | POA: Diagnosis present

## 2023-08-04 DIAGNOSIS — R2990 Unspecified symptoms and signs involving the nervous system: Secondary | ICD-10-CM

## 2023-08-04 DIAGNOSIS — Z833 Family history of diabetes mellitus: Secondary | ICD-10-CM

## 2023-08-04 DIAGNOSIS — Z6836 Body mass index (BMI) 36.0-36.9, adult: Secondary | ICD-10-CM | POA: Diagnosis not present

## 2023-08-04 DIAGNOSIS — M48061 Spinal stenosis, lumbar region without neurogenic claudication: Secondary | ICD-10-CM | POA: Diagnosis present

## 2023-08-04 DIAGNOSIS — I2510 Atherosclerotic heart disease of native coronary artery without angina pectoris: Secondary | ICD-10-CM | POA: Diagnosis present

## 2023-08-04 DIAGNOSIS — Z794 Long term (current) use of insulin: Secondary | ICD-10-CM | POA: Diagnosis not present

## 2023-08-04 DIAGNOSIS — Z79899 Other long term (current) drug therapy: Secondary | ICD-10-CM

## 2023-08-04 DIAGNOSIS — Z9282 Status post administration of tPA (rtPA) in a different facility within the last 24 hours prior to admission to current facility: Secondary | ICD-10-CM | POA: Diagnosis not present

## 2023-08-04 DIAGNOSIS — I6389 Other cerebral infarction: Principal | ICD-10-CM | POA: Diagnosis present

## 2023-08-04 DIAGNOSIS — Z8719 Personal history of other diseases of the digestive system: Secondary | ICD-10-CM

## 2023-08-04 DIAGNOSIS — J4489 Other specified chronic obstructive pulmonary disease: Secondary | ICD-10-CM | POA: Diagnosis present

## 2023-08-04 DIAGNOSIS — Z981 Arthrodesis status: Secondary | ICD-10-CM

## 2023-08-04 DIAGNOSIS — Z801 Family history of malignant neoplasm of trachea, bronchus and lung: Secondary | ICD-10-CM

## 2023-08-04 DIAGNOSIS — F1410 Cocaine abuse, uncomplicated: Secondary | ICD-10-CM | POA: Diagnosis present

## 2023-08-04 DIAGNOSIS — R4781 Slurred speech: Secondary | ICD-10-CM | POA: Diagnosis present

## 2023-08-04 DIAGNOSIS — R2689 Other abnormalities of gait and mobility: Secondary | ICD-10-CM

## 2023-08-04 DIAGNOSIS — R29702 NIHSS score 2: Secondary | ICD-10-CM | POA: Diagnosis present

## 2023-08-04 DIAGNOSIS — R29700 NIHSS score 0: Secondary | ICD-10-CM | POA: Diagnosis not present

## 2023-08-04 DIAGNOSIS — Z9889 Other specified postprocedural states: Secondary | ICD-10-CM

## 2023-08-04 LAB — DIFFERENTIAL
Abs Immature Granulocytes: 0.02 10*3/uL (ref 0.00–0.07)
Basophils Absolute: 0 10*3/uL (ref 0.0–0.1)
Basophils Relative: 0 %
Eosinophils Absolute: 0.2 10*3/uL (ref 0.0–0.5)
Eosinophils Relative: 2 %
Immature Granulocytes: 0 %
Lymphocytes Relative: 15 %
Lymphs Abs: 1 10*3/uL (ref 0.7–4.0)
Monocytes Absolute: 0.6 10*3/uL (ref 0.1–1.0)
Monocytes Relative: 8 %
Neutro Abs: 5 10*3/uL (ref 1.7–7.7)
Neutrophils Relative %: 75 %

## 2023-08-04 LAB — I-STAT CHEM 8, ED
BUN: 15 mg/dL (ref 8–23)
Calcium, Ion: 1.2 mmol/L (ref 1.15–1.40)
Chloride: 97 mmol/L — ABNORMAL LOW (ref 98–111)
Creatinine, Ser: 1.1 mg/dL (ref 0.61–1.24)
Glucose, Bld: 220 mg/dL — ABNORMAL HIGH (ref 70–99)
HCT: 47 % (ref 39.0–52.0)
Hemoglobin: 16 g/dL (ref 13.0–17.0)
Potassium: 3.6 mmol/L (ref 3.5–5.1)
Sodium: 136 mmol/L (ref 135–145)
TCO2: 26 mmol/L (ref 22–32)

## 2023-08-04 LAB — COMPREHENSIVE METABOLIC PANEL
ALT: 24 U/L (ref 0–44)
AST: 22 U/L (ref 15–41)
Albumin: 3.8 g/dL (ref 3.5–5.0)
Alkaline Phosphatase: 68 U/L (ref 38–126)
Anion gap: 9 (ref 5–15)
BUN: 15 mg/dL (ref 8–23)
CO2: 27 mmol/L (ref 22–32)
Calcium: 9.5 mg/dL (ref 8.9–10.3)
Chloride: 99 mmol/L (ref 98–111)
Creatinine, Ser: 1.05 mg/dL (ref 0.61–1.24)
GFR, Estimated: >60 mL/min (ref >60)
Glucose, Bld: 222 mg/dL — ABNORMAL HIGH (ref 70–99)
Potassium: 3.5 mmol/L (ref 3.5–5.1)
Sodium: 135 mmol/L (ref 135–145)
Total Bilirubin: 1.3 mg/dL — ABNORMAL HIGH (ref 0.0–1.2)
Total Protein: 6.8 g/dL (ref 6.5–8.1)

## 2023-08-04 LAB — CBC
HCT: 47.1 % (ref 39.0–52.0)
Hemoglobin: 14.8 g/dL (ref 13.0–17.0)
MCH: 26.5 pg (ref 26.0–34.0)
MCHC: 31.4 g/dL (ref 30.0–36.0)
MCV: 84.4 fL (ref 80.0–100.0)
Platelets: 121 10*3/uL — ABNORMAL LOW (ref 150–400)
RBC: 5.58 MIL/uL (ref 4.22–5.81)
RDW: 14.8 % (ref 11.5–15.5)
WBC: 6.8 10*3/uL (ref 4.0–10.5)
nRBC: 0 % (ref 0.0–0.2)

## 2023-08-04 LAB — RAPID URINE DRUG SCREEN, HOSP PERFORMED
Amphetamines: NOT DETECTED
Barbiturates: NOT DETECTED
Benzodiazepines: NOT DETECTED
Cocaine: POSITIVE — AB
Opiates: NOT DETECTED
Tetrahydrocannabinol: NOT DETECTED

## 2023-08-04 LAB — PROTIME-INR
INR: 0.9 (ref 0.8–1.2)
Prothrombin Time: 12.8 s (ref 11.4–15.2)

## 2023-08-04 LAB — URINALYSIS, ROUTINE W REFLEX MICROSCOPIC
Bilirubin Urine: NEGATIVE
Glucose, UA: NEGATIVE mg/dL
Hgb urine dipstick: NEGATIVE
Ketones, ur: NEGATIVE mg/dL
Leukocytes,Ua: NEGATIVE
Nitrite: NEGATIVE
Protein, ur: NEGATIVE mg/dL
Specific Gravity, Urine: 1.027 (ref 1.005–1.030)
pH: 6 (ref 5.0–8.0)

## 2023-08-04 LAB — ETHANOL: Alcohol, Ethyl (B): <10 mg/dL (ref <10)

## 2023-08-04 LAB — APTT: aPTT: 26 s (ref 24–36)

## 2023-08-04 LAB — CBG MONITORING, ED: Glucose-Capillary: 221 mg/dL — ABNORMAL HIGH (ref 70–99)

## 2023-08-04 MED ORDER — STROKE: EARLY STAGES OF RECOVERY BOOK
Freq: Once | Status: AC
  Filled 2023-08-04 (×2): qty 1

## 2023-08-04 MED ORDER — ACETAMINOPHEN 650 MG RE SUPP: 650.0000 mg | RECTAL | Status: DC | PRN

## 2023-08-04 MED ORDER — ACETAMINOPHEN 325 MG PO TABS
650.0000 mg | ORAL_TABLET | ORAL | Status: DC | PRN
  Administered 2023-08-05 – 2023-08-06 (×2): 650 mg via ORAL
  Filled 2023-08-04 (×2): qty 2

## 2023-08-04 MED ORDER — TENECTEPLASE FOR STROKE: 25.0000 mg | PACK | Freq: Once | INTRAVENOUS | Status: AC

## 2023-08-04 MED ORDER — SENNOSIDES-DOCUSATE SODIUM 8.6-50 MG PO TABS: 1.0000 | ORAL_TABLET | Freq: Every evening | ORAL | Status: DC | PRN

## 2023-08-04 MED ORDER — HYDRALAZINE HCL 20 MG/ML IJ SOLN
5.0000 mg | INTRAMUSCULAR | Status: DC | PRN
  Administered 2023-08-05 (×2): 5 mg via INTRAVENOUS
  Filled 2023-08-04 (×2): qty 1

## 2023-08-04 MED ORDER — PANTOPRAZOLE SODIUM 40 MG IV SOLR
40.0000 mg | Freq: Every day | INTRAVENOUS | Status: DC
  Administered 2023-08-05: 40 mg via INTRAVENOUS
  Filled 2023-08-04: qty 10

## 2023-08-04 MED ORDER — ACETAMINOPHEN 160 MG/5ML PO SOLN: 650.0000 mg | ORAL | Status: DC | PRN

## 2023-08-04 MED ORDER — LABETALOL HCL 5 MG/ML IV SOLN
10.0000 mg | INTRAVENOUS | Status: DC | PRN
  Administered 2023-08-06: 10 mg via INTRAVENOUS
  Filled 2023-08-04 (×2): qty 4

## 2023-08-04 MED ORDER — IOHEXOL 350 MG/ML SOLN
100.0000 mL | Freq: Once | INTRAVENOUS | Status: AC | PRN
  Administered 2023-08-04: 100 mL via INTRAVENOUS

## 2023-08-04 MED ORDER — TENECTEPLASE FOR STROKE
PACK | INTRAVENOUS | Status: AC
  Administered 2023-08-04: 25 mg via INTRAVENOUS
  Filled 2023-08-04: qty 10

## 2023-08-04 NOTE — ED Notes (Signed)
 Patient transported to CT

## 2023-08-04 NOTE — ED Triage Notes (Signed)
 Pt stated that he began seeing double vision around 1600. Pt stated he is also staggering a lot and has been slurring some words.

## 2023-08-04 NOTE — ED Notes (Signed)
 ED TO INPATIENT HANDOFF REPORT  ED Nurse Name and Phone #: therisa RAMAN Name/Age/Gender Vincent Ramos 73 y.o. male Room/Bed: APA08/APA08  Code Status   Code Status: Prior  Home/SNF/Other Home Patient oriented to: self, place, time, and situation Is this baseline? Yes   Triage Complete: Triage complete  Chief Complaint Stroke determined by clinical assessment [facility]) [I63.9]  Triage Note Pt stated that he began seeing double vision around 1600. Pt stated he is also staggering a lot and has been slurring some words.    Allergies Allergies  Allergen Reactions   Amlodipine      Felt jittery constantly   Novolog  [Insulin  Aspart (Human Analog)] Hives, Itching and Rash   Victoza [Liraglutide] Itching and Rash    Level of Care/Admitting Diagnosis ED Disposition     ED Disposition  Admit   Condition  --   Comment  [facility] [[id number]]  Level of Care: ICU [6]  May admit patient to [facility] or [facility] if equivalent level of care is available:: No  Interfacility transfer: Yes  Covid Evaluation: Asymptomatic - no recent exposure (last 10 days) testing not required  Diagnosis: Stroke determined by clinical assessment [facility]) [[id number]]  Admitting Physician: JERRIE LOLA CROME [[id number]]  Attending Physician: JERRIE LOLA CROME [[id number]]  Certification:: I certify this patient is being admitted for an inpatient-only procedure  Expected Medical Readiness: 08/06/2023          B Medical/Surgery History Past Medical History:  Diagnosis Date   Abnormal nuclear cardiac imaging test 12/27/2017   Acute on chronic diastolic CHF (congestive heart failure) (HCC) 09/17/2017   Acute respiratory failure with hypoxia (HCC) 06/01/2017   Arthritis    Asthma    CAD (coronary artery disease)    a. 11/2017: cath showing widely patent coronary arteries with normal left main, 30% mid LAD, luminal irregularities in the proximal and mid circumflex, and 30 to 40%  stenosis in the mid RCA.   CAP (community acquired pneumonia) 07/31/2014   Community acquired pneumonia 07/31/2014   Constipation 02/06/2018   COPD exacerbation (HCC) 09/17/2017   Dysphagia    Elevated troponin 09/17/2017   Enlarged prostate    Grade II diastolic dysfunction    Hypertension    Lumbar stenosis 10/07/2015   Obesity 07/31/2014   PNA (pneumonia) 05/30/2017   Pneumonia    Pneumonia    Polyp of rectum    Respiratory failure (HCC) 09/18/2017   SBO (small bowel obstruction) (HCC) 05/08/2019   Shortness of breath    Sleep apnea    Sleep Study 08/21/17: Severe OSA   Type 2 diabetes mellitus (HCC)    Past Surgical History:  Procedure Laterality Date   ANTERIOR CERVICAL DECOMP/DISCECTOMY FUSION N/A 09/17/2020   Procedure: Anterior Cevrvical Decompresion / Discectomy Fusion Cervical four-five,Cervical five-six,Cervical six- seven;  Surgeon: Debby Dorn MATSU, MD;  Location: [facility] OR;  Service: Neurosurgery;  Laterality: N/A;   CIRCUMCISION     COLONOSCOPY WITH PROPOFOL  N/A 08/08/2017   Procedure: COLONOSCOPY WITH PROPOFOL ;  Surgeon: Harvey Margo CROME, MD;  Location: [facility] ENDO SUITE;  Service: Endoscopy;  Laterality: N/A;  1:00pm   ESOPHAGEAL DILATION N/A 06/07/2017   Procedure: ESOPHAGEAL DILATION;  Surgeon: Shaaron Lamar HERO, MD;  Location: [facility] ENDO SUITE;  Service: Endoscopy;  Laterality: N/A;   ESOPHAGOGASTRODUODENOSCOPY (EGD) WITH PROPOFOL  N/A 06/07/2017   Procedure: ESOPHAGOGASTRODUODENOSCOPY (EGD) WITH PROPOFOL ;  Surgeon: Shaaron Lamar HERO, MD;  Location: [facility] ENDO SUITE;  Service: Endoscopy;  Laterality: N/A;  HYDROCELE EXCISION  10/14/2011   Procedure: HYDROCELECTOMY ADULT;  Surgeon: Norleen JINNY Seltzer, MD;  Location: [facility] ORS;  Service: Urology;  Laterality: Right;   LACERATION REPAIR     left hand   LUMBAR LAMINECTOMY/DECOMPRESSION MICRODISCECTOMY Right 10/07/2015   Procedure: Right Lumbar four-five ,lumbar five sacral-one Laminectomy;  Surgeon: Catalina Stains, MD;  Location: [facility] NEURO ORS;  Service:  Neurosurgery;  Laterality: Right;   RIGHT/LEFT HEART CATH AND CORONARY ANGIOGRAPHY N/A 12/27/2017   Procedure: RIGHT/LEFT HEART CATH AND CORONARY ANGIOGRAPHY;  Surgeon: Claudene Victory ORN, MD;  Location: [facility] INVASIVE CV LAB;  Service: Cardiovascular;  Laterality: N/A;     A IV Location/Drains/Wounds Patient Lines/Drains/Airways Status     Active Line/Drains/Airways     Name Placement date Placement time Site Days   Peripheral IV 08/04/23 18 G Right Antecubital 08/04/23  1949  Antecubital  less than 1   Peripheral IV 08/04/23 20 G Left Antecubital 08/04/23  2000  Antecubital  less than 1   Open Drain 1 Right Other (Comment) 10/14/11  1246  Other (Comment)  4312            Intake/Output Last 24 hours  Intake/Output Summary (Last 24 hours) at 08/04/2023 2241 Last data filed at 08/04/2023 2122 Gross per 24 hour  Intake --  Output 500 ml  Net -500 ml    Labs/Imaging Results for orders placed or performed during the hospital encounter of 08/04/23 (from the past 48 hours)  Urine rapid drug screen (hosp performed)     Status: Abnormal   Collection Time: 08/04/23  7:43 PM  Result Value Ref Range   Opiates NONE DETECTED NONE DETECTED   Cocaine POSITIVE (A) NONE DETECTED   Benzodiazepines NONE DETECTED NONE DETECTED   Amphetamines NONE DETECTED NONE DETECTED   Tetrahydrocannabinol NONE DETECTED NONE DETECTED   Barbiturates NONE DETECTED NONE DETECTED    Comment: (NOTE) DRUG SCREEN FOR MEDICAL PURPOSES ONLY.  IF CONFIRMATION IS NEEDED FOR ANY PURPOSE, NOTIFY LAB WITHIN 5 DAYS.  LOWEST DETECTABLE LIMITS FOR URINE DRUG SCREEN Drug Class                     Cutoff (ng/mL) Amphetamine and metabolites    1000 Barbiturate and metabolites    200 Benzodiazepine                 200 Opiates and metabolites        300 Cocaine and metabolites        300 THC                            50 Performed at [facility], [address], [place], [place] [address]   Urinalysis, Routine w reflex  microscopic -Urine, Clean Catch     Status: None   Collection Time: 08/04/23  7:43 PM  Result Value Ref Range   Color, Urine YELLOW YELLOW   APPearance CLEAR CLEAR   Specific Gravity, Urine 1.027 1.005 - 1.030   pH 6.0 5.0 - 8.0   Glucose, UA NEGATIVE NEGATIVE mg/dL   Hgb urine dipstick NEGATIVE NEGATIVE   Bilirubin Urine NEGATIVE NEGATIVE   Ketones, ur NEGATIVE NEGATIVE mg/dL   Protein, ur NEGATIVE NEGATIVE mg/dL   Nitrite NEGATIVE NEGATIVE   Leukocytes,Ua NEGATIVE NEGATIVE    Comment: Performed at [facility], [address] [place], [place] [address]  CBG monitoring, ED     Status: Abnormal   Collection Time: 08/04/23  7:46 PM  Result Value Ref Range   Glucose-Capillary 221 (H) 70 - 99 mg/dL    Comment: Glucose reference range applies only to samples taken after fasting for at least 8 hours.  Ethanol     Status: None   Collection Time: 08/04/23  7:47 PM  Result Value Ref Range   Alcohol, Ethyl (B) <10 <10 mg/dL    Comment: (NOTE) Lowest detectable limit for serum alcohol is 10 mg/dL.  For medical purposes only. Performed at [facility], [address], [place], [place] [address]   Protime-INR     Status: None   Collection Time: 08/04/23  7:47 PM  Result Value Ref Range   Prothrombin Time 12.8 11.4 - 15.2 seconds   INR 0.9 0.8 - 1.2    Comment: (NOTE) INR goal varies based on device and disease states. Performed at [facility], [address] [place], [place] [address]   APTT     Status: None   Collection Time: 08/04/23  7:47 PM  Result Value Ref Range   aPTT 26 24 - 36 seconds    Comment: Performed at [facility], [address] [place], [place] [address]  CBC     Status: Abnormal   Collection Time: 08/04/23  7:47 PM  Result Value Ref Range   WBC 6.8 4.0 - 10.5 K/uL   RBC 5.58 4.22 - 5.81 MIL/uL   Hemoglobin 14.8 13.0 - 17.0 g/dL   HCT 52.8 60.9 - 47.9 %   MCV 84.4 80.0 - 100.0 fL   MCH 26.5 26.0 - 34.0 pg   MCHC 31.4 30.0 - 36.0 g/dL   RDW 85.1 88.4 - 84.4  %   Platelets 121 (L) 150 - 400 K/uL   nRBC 0.0 0.0 - 0.2 %    Comment: Performed at [facility], [address], [place], [place] [address]  Differential     Status: None   Collection Time: 08/04/23  7:47 PM  Result Value Ref Range   Neutrophils Relative % 75 %   Neutro Abs 5.0 1.7 - 7.7 K/uL   Lymphocytes Relative 15 %   Lymphs Abs 1.0 0.7 - 4.0 K/uL   Monocytes Relative 8 %   Monocytes Absolute 0.6 0.1 - 1.0 K/uL   Eosinophils Relative 2 %   Eosinophils Absolute 0.2 0.0 - 0.5 K/uL   Basophils Relative 0 %   Basophils Absolute 0.0 0.0 - 0.1 K/uL   Immature Granulocytes 0 %   Abs Immature Granulocytes 0.02 0.00 - 0.07 K/uL    Comment: Performed at [facility], [address] [place], [place] [address]  Comprehensive metabolic panel     Status: Abnormal   Collection Time: 08/04/23  7:47 PM  Result Value Ref Range   Sodium 135 135 - 145 mmol/L   Potassium 3.5 3.5 - 5.1 mmol/L   Chloride 99 98 - 111 mmol/L   CO2 27 22 - 32 mmol/L   Glucose, Bld 222 (H) 70 - 99 mg/dL    Comment: Glucose reference range applies only to samples taken after fasting for at least 8 hours.   BUN 15 8 - 23 mg/dL   Creatinine, Ser 8.94 0.61 - 1.24 mg/dL   Calcium  9.5 8.9 - 10.3 mg/dL   Total Protein 6.8 6.5 - 8.1 g/dL   Albumin  3.8 3.5 - 5.0 g/dL   AST 22 15 - 41 U/L   ALT 24 0 - 44 U/L   Alkaline Phosphatase 68 38 - 126 U/L  Total Bilirubin 1.3 (H) 0.0 - 1.2 mg/dL   GFR, Estimated >39 >39 mL/min    Comment: (NOTE) Calculated using the CKD-EPI Creatinine Equation (2021)    Anion gap 9 5 - 15    Comment: Performed at [facility], [address], [place], [place] [address]  I-stat chem 8, ED     Status: Abnormal   Collection Time: 08/04/23  7:51 PM  Result Value Ref Range   Sodium 136 135 - 145 mmol/L   Potassium 3.6 3.5 - 5.1 mmol/L   Chloride 97 (L) 98 - 111 mmol/L   BUN 15 8 - 23 mg/dL   Creatinine, Ser 8.89 0.61 - 1.24 mg/dL   Glucose, Bld 779 (H) 70 - 99 mg/dL    Comment: Glucose  reference range applies only to samples taken after fasting for at least 8 hours.   Calcium , Ion 1.20 1.15 - 1.40 mmol/L   TCO2 26 22 - 32 mmol/L   Hemoglobin 16.0 13.0 - 17.0 g/dL   HCT 52.9 60.9 - 47.9 %   CT ANGIO HEAD NECK W WO CM W PERF (CODE STROKE) Result Date: 08/04/2023 CLINICAL DATA:  Stroke/TIA, determine embolic source double vision, vertigo EXAM: CT ANGIOGRAPHY HEAD AND NECK CT PERFUSION BRAIN TECHNIQUE: Multidetector CT imaging of the head and neck was performed using the standard protocol during bolus administration of intravenous contrast. Multiplanar CT image reconstructions and MIPs were obtained to evaluate the vascular anatomy. Carotid stenosis measurements (when applicable) are obtained utilizing NASCET criteria, using the distal internal carotid diameter as the denominator. Multiphase CT imaging of the brain was performed following IV bolus contrast injection. Subsequent parametric perfusion maps were calculated using RAPID software. RADIATION DOSE REDUCTION: This exam was performed according to the departmental dose-optimization program which includes automated exposure control, adjustment of the mA and/or kV according to patient size and/or use of iterative reconstruction technique. CONTRAST:  OMNIPAQUE  IOHEXOL  350 MG/ML SOLN COMPARISON:  CT head from the study. FINDINGS: CTA NECK FINDINGS Aortic arch: Great vessel origins are patent without significant stenosis. Aortic atherosclerosis. Right carotid system: Atherosclerosis without significant (greater than 50%) stenosis. Tortuous ICA. Left carotid system: Atherosclerosis without significant (greater than 50%) stenosis. Vertebral arteries: Codominant. No evidence of dissection, stenosis (50% or greater) or occlusion. Skeleton: Multilevel ACDF.  No acute abnormality limited assessment. Other neck: No acute abnormality on limited assessment. Upper chest: Visualized lung apices are clear. Review of the MIP images confirms the above  findings CTA HEAD FINDINGS Anterior circulation: Bilateral intracranial ICAs, MCAs and ACAs are patent without proximal hemodynamically significant stenosis. Prominent azygous ACA, anatomic variant. Posterior circulation: Bilateral intradural vertebral arteries, basilar artery and bilateral fissural arteries are patent without proximal hemodynamically significant stenosis. Venous sinuses: As permitted by contrast timing, patent. Anatomic variants: Detailed above. Review of the MIP images confirms the above findings CT Brain Perfusion Findings: ASPECTS: 10. CBF (<30%) Volume: 0mL Perfusion (Tmax>6.0s) volume: 5mL Mismatch Volume: 5mL, which is likely artifactual given location in the inferior right temporal lobe where there is commonly streak artifact. This also does not correlate with a vascular territory. Infarction Location:None identified IMPRESSION: 1. No large vessel occlusion or proximal high-grade stenosis. 2. The hyperdense vessel seen on CT head correlates with a prominent but patent azygous ACA, anatomic variant. 3. No convincing evidence of core infarct or penumbra. Electronically Signed   By: Gilmore GORMAN Molt M.D.   On: 08/04/2023 20:26   CT HEAD CODE STROKE WO CONTRAST Result Date: 08/04/2023 CLINICAL DATA:  Code  stroke.  Neuro deficit, acute, stroke suspected EXAM: CT HEAD WITHOUT CONTRAST TECHNIQUE: Contiguous axial images were obtained from the base of the skull through the vertex without intravenous contrast. RADIATION DOSE REDUCTION: This exam was performed according to the departmental dose-optimization program which includes automated exposure control, adjustment of the mA and/or kV according to patient size and/or use of iterative reconstruction technique. COMPARISON:  None Available. FINDINGS: Brain: No evidence of acute large vascular territory infarction, hemorrhage, hydrocephalus, extra-axial collection or mass lesion/mass effect. Patchy white matter hypodensities are nonspecific but  compatible with chronic microvascular ischemic disease. Vascular: Question hyperdense left A2 ACA. Skull: No acute fracture. Sinuses/Orbits: No acute finding. Remote appearing right medial orbital wall fracture. ASPECTS [facility] Stroke Program Early CT Score) Total score (0-10 with 10 being normal): 10. IMPRESSION: 1. Question hyperdense left A2 ACA, which could be artifactual or represent thrombus. Recommend CTA head/neck. 2. No evidence of acute large vascular territory infarct or acute hemorrhage. ASPECTS is 10. 3. Chronic microvascular ischemic disease. Code stroke imaging results were communicated on 08/04/2023 at 8:04 pm to provider Dr. Franklyn Via telephone, who verbally acknowledged these results. Electronically Signed   By: Gilmore GORMAN Molt M.D.   On: 08/04/2023 20:05    Pending Labs Unresulted Labs (From admission, onward)    None       Vitals/Pain Today's Vitals   08/04/23 2145 08/04/23 2200 08/04/23 2215 08/04/23 2230  BP: (!) 140/75 (!) 152/75 (!) 159/77 (!) 142/72  Pulse: 65 62 65 66  Resp: 14 12 15 17   Temp:      TempSrc:      SpO2: 94% 94% 92% 93%  Weight:      Height:      PainSc:        Isolation Precautions No active isolations  Medications Medications  iohexol  (OMNIPAQUE ) 350 MG/ML injection 100 mL (100 mLs Intravenous Contrast Given 08/04/23 2007)  tenecteplase  (TNKASE ) injection for Stroke 25 mg (25 mg Intravenous Given 08/04/23 2024)    Mobility walks     Focused Assessments Neuro Assessment Handoff:  Swallow screen pass? Yes  Cardiac Rhythm: Normal sinus rhythm NIH Stroke Scale  Dizziness Present: No Headache Present: No Level of Consciousness (1a.)   : Alert, keenly responsive LOC Questions (1b. )   : Answers both questions correctly LOC Commands (1c. )   : Performs both tasks correctly Best Gaze (2. )  : Normal Visual (3. )  : No visual loss Facial Palsy (4. )    : Normal symmetrical movements Motor Arm, Left (5a. )   : No drift Motor Arm, Right  (5b. ) : No drift Motor Leg, Left (6a. )  : No drift Motor Leg, Right (6b. ) : No drift Limb Ataxia (7. ): Absent Sensory (8. )  : Normal, no sensory loss Best Language (9. )  : No aphasia Dysarthria (10. ): Normal Extinction/Inattention (11.)   : No Abnormality Complete NIHSS TOTAL: 0 Last date known well: 08/04/23 Last time known well: 1600 Neuro Assessment: Exceptions to WDL (reported double vision since 4pm, staggering gait) Neuro Checks:      Has TPA been given? Yes Temp: 98.2 F (36.8 C) (01/03 2045) Temp Source: Oral (01/03 2021) BP: 142/72 (01/03 2230) Pulse Rate: 66 (01/03 2230) If patient is a Neuro Trauma and patient is going to OR before floor call report to 4N Charge nurse: [phone] or [phone]   R Recommendations: See Admitting Provider Note  Report given to:   Additional Notes:

## 2023-08-04 NOTE — H&P (Signed)
 NEUROLOGY H&P NOTE   Date of service: August 04, 2023 Patient Name: Vincent Ramos MRN:  [id number] DOB:  01/21/1951 Chief Complaint: double vision, trouble walking, slurred speech  History of Present Illness  Vincent Ramos is a 73 y.o. male  has a past medical history of Abnormal nuclear cardiac imaging test (12/27/2017), Acute on chronic diastolic CHF (congestive heart failure) (HCC) (09/17/2017), Acute respiratory failure with hypoxia (HCC) (06/01/2017), Arthritis, Asthma, CAD (coronary artery disease), CAP (community acquired pneumonia) (07/31/2014), Community acquired pneumonia (07/31/2014), Constipation (02/06/2018), COPD exacerbation (HCC) (09/17/2017), Dysphagia, Elevated troponin (09/17/2017), Enlarged prostate, Grade II diastolic dysfunction, Hypertension, Lumbar stenosis (10/07/2015), Obesity (07/31/2014), PNA (pneumonia) (05/30/2017), Pneumonia, Pneumonia, Polyp of rectum, Respiratory failure (HCC) (09/18/2017), SBO (small bowel obstruction) (HCC) (05/08/2019), Shortness of breath, Sleep apnea, and Type 2 diabetes mellitus (HCC).    Patient reports he was in his usual state of health today when while his wife was driving he suddenly had acute onset of double vision.  When he got out of the car he had difficulty walking and his speech was noted to be very slurred.  Eventually came to [facility] ED by private vehicle for evaluation and was treated with TNK.  Reports he had a brief episode of unsteady gait yesterday lasting approximately 30 minutes.  No other recent medical concerns  Since treatment at [facility], symptoms have markedly improved although he has some residual diplopia  Per Dr. Oneil Evert note:  Patient was brought by private transportation with symptoms of double vision, unsteady gait . 73 year old male history of hypertension, diabetes, CHF, CAD on aspirin  81 mg who I am seeing as a stroke alert for double vision, unsteady gait and slurring speech.   Patient states that at  around 4 PM today he is began to have double vision where he was seeing everything side-by-side. He also was staggering when he was walking. He denied any significant vertigo. Wife states that his speech is a little slurred as well. He came to the hospital and blood pressure 167/64 on arrival, blood glucose 220. Patient is accompanied with wife the denied recent surgery, anticoagulant use, recent blood in the urine or stool, no history of intracranial hemorrhage. He denies any weakness in his arms or legs.  Last known well: 4 PM per telespecialist note  Modified rankin score: 0-Completely asymptomatic and back to baseline post- stroke IV Thrombolysis: 2024 TNK 25mg /31mL administered at [facility] by Dr. Evert telespecialist  Thrombectomy: No, no LVO NIH 2-3 on tele exam (double vision , dysconjugate gaze on lateral eye movements, ataxia with 1 limb)  NIHSS components Score: Comment  1a Level of Conscious 0[x]  1[]  2[]  3[]      1b LOC Questions 0[x]  1[]  2[]       1c LOC Commands 0[x]  1[]  2[]       2 Best Gaze 0[x]  1[]  2[]       3 Visual 0[x]  1[]  2[]  3[]      4 Facial Palsy 0[x]  1[]  2[]  3[]      5a Motor Arm - left 0[x]  1[]  2[]  3[]  4[]  UN[]    5b Motor Arm - Right 0[x]  1[]  2[]  3[]  4[]  UN[]    6a Motor Leg - Left 0[x]  1[]  2[]  3[]  4[]  UN[]    6b Motor Leg - Right 0[x]  1[]  2[]  3[]  4[]  UN[]    7 Limb Ataxia 0[x]  1[]  2[]  3[]  UN[]     8 Sensory 0[x]  1[]  2[]  UN[]      9 Best Language 0[x]  1[]  2[]  3[]      10  Dysarthria 0[x]  1[]  2[]  UN[]      11 Extinct. and Inattention 0[x]  1[]  2[]       TOTAL:       ROS  Comprehensive ROS performed and pertinent positives documented in the HPI  Past History   Past Medical History:  Diagnosis Date   Abnormal nuclear cardiac imaging test 12/27/2017   Acute on chronic diastolic CHF (congestive heart failure) (HCC) 09/17/2017   Acute respiratory failure with hypoxia (HCC) 06/01/2017   Arthritis    Asthma    CAD (coronary artery disease)    a. 11/2017: cath showing widely  patent coronary arteries with normal left main, 30% mid LAD, luminal irregularities in the proximal and mid circumflex, and 30 to 40% stenosis in the mid RCA.   CAP (community acquired pneumonia) 07/31/2014   Community acquired pneumonia 07/31/2014   Constipation 02/06/2018   COPD exacerbation (HCC) 09/17/2017   Dysphagia    Elevated troponin 09/17/2017   Enlarged prostate    Grade II diastolic dysfunction    Hypertension    Lumbar stenosis 10/07/2015   Obesity 07/31/2014   PNA (pneumonia) 05/30/2017   Pneumonia    Pneumonia    Polyp of rectum    Respiratory failure (HCC) 09/18/2017   SBO (small bowel obstruction) (HCC) 05/08/2019   Shortness of breath    Sleep apnea    Sleep Study 08/21/17: Severe OSA   Type 2 diabetes mellitus (HCC)    Past Surgical History:  Procedure Laterality Date   ANTERIOR CERVICAL DECOMP/DISCECTOMY FUSION N/A 09/17/2020   Procedure: Anterior Cevrvical Decompresion / Discectomy Fusion Cervical four-five,Cervical five-six,Cervical six- seven;  Surgeon: Debby Dorn MATSU, MD;  Location: [facility] OR;  Service: Neurosurgery;  Laterality: N/A;   CIRCUMCISION     COLONOSCOPY WITH PROPOFOL  N/A 08/08/2017   Procedure: COLONOSCOPY WITH PROPOFOL ;  Surgeon: Harvey Margo CROME, MD;  Location: [facility] ENDO SUITE;  Service: Endoscopy;  Laterality: N/A;  1:00pm   ESOPHAGEAL DILATION N/A 06/07/2017   Procedure: ESOPHAGEAL DILATION;  Surgeon: Shaaron Lamar HERO, MD;  Location: [facility] ENDO SUITE;  Service: Endoscopy;  Laterality: N/A;   ESOPHAGOGASTRODUODENOSCOPY (EGD) WITH PROPOFOL  N/A 06/07/2017   Procedure: ESOPHAGOGASTRODUODENOSCOPY (EGD) WITH PROPOFOL ;  Surgeon: Shaaron Lamar HERO, MD;  Location: [facility] ENDO SUITE;  Service: Endoscopy;  Laterality: N/A;   HYDROCELE EXCISION  10/14/2011   Procedure: HYDROCELECTOMY ADULT;  Surgeon: Norleen JINNY Seltzer, MD;  Location: [facility] ORS;  Service: Urology;  Laterality: Right;   LACERATION REPAIR     left hand   LUMBAR LAMINECTOMY/DECOMPRESSION MICRODISCECTOMY Right 10/07/2015    Procedure: Right Lumbar four-five ,lumbar five sacral-one Laminectomy;  Surgeon: Catalina Stains, MD;  Location: [facility] NEURO ORS;  Service: Neurosurgery;  Laterality: Right;   RIGHT/LEFT HEART CATH AND CORONARY ANGIOGRAPHY N/A 12/27/2017   Procedure: RIGHT/LEFT HEART CATH AND CORONARY ANGIOGRAPHY;  Surgeon: Claudene Victory ORN, MD;  Location: [facility] INVASIVE CV LAB;  Service: Cardiovascular;  Laterality: N/A;   Family History  Problem Relation Age of Onset   Diabetes Other    Hypertension Other    CAD Other    Cancer Other    Lung cancer Mother        smoker   Diabetes Sister    Hypertension Sister    Anesthesia problems Neg Hx    Hypotension Neg Hx    Malignant hyperthermia Neg Hx    Pseudochol deficiency Neg Hx    Colon cancer Neg Hx    Colon polyps Neg Hx    Social History   Socioeconomic History  Marital status: Married    Spouse name: Not on file   Number of children: Not on file   Years of education: Not on file   Highest education level: Not on file  Occupational History   Not on file  Tobacco Use   Smoking status: Never   Smokeless tobacco: Never  Vaping Use   Vaping status: Never Used  Substance and Sexual Activity   Alcohol use: No    Alcohol/week: 0.0 standard drinks of alcohol    Comment: rare beer   Drug use: No   Sexual activity: Yes    Birth control/protection: None  Other Topics Concern   Not on file  Social History Narrative   Not on file   Social Drivers of Health   Financial Resource Strain: Not on file  Food Insecurity: No Food Insecurity (09/19/2022)   Hunger Vital Sign    Worried About Running Out of Food in the Last Year: Never true    Ran Out of Food in the Last Year: Never true  Transportation Needs: Not on file  Physical Activity: Not on file  Stress: Not on file  Social Connections: Not on file   Allergies  Allergen Reactions   Amlodipine      Felt jittery constantly   Novolog  [Insulin  Aspart (Human Analog)] Hives, Itching and Rash    Victoza [Liraglutide] Itching and Rash    Medications  No current facility-administered medications for this encounter.  Current Outpatient Medications:    albuterol  (VENTOLIN  HFA) 108 (90 Base) MCG/ACT inhaler, Inhale 2 puffs into the lungs every 6 (six) hours as needed for wheezing or shortness of breath., Disp: , Rfl:    aspirin  EC 81 MG tablet, Take 1 tablet (81 mg total) by mouth daily with breakfast., Disp: 30 tablet, Rfl: 4   gabapentin  (NEURONTIN ) 300 MG capsule, Take 300 mg by mouth daily., Disp: , Rfl:    glipiZIDE  (GLUCOTROL ) 10 MG tablet, Take 10 mg by mouth 2 (two) times daily before a meal., Disp: , Rfl:    hydrALAZINE  (APRESOLINE ) 100 MG tablet, Take 100 mg by mouth daily at 12 noon. Pt takes 1 tablet by mouth daily., Disp: , Rfl:    isosorbide  mononitrate (IMDUR ) 30 MG 24 hr tablet, Take 1 tablet (30 mg total) by mouth daily. Can take an additional tab as needed, Disp: , Rfl:    nitroGLYCERIN  (NITROSTAT ) 0.4 MG SL tablet, Place 1 tablet (0.4 mg total) under the tongue every 5 (five) minutes as needed., Disp: 25 tablet, Rfl: 3   olmesartan -hydrochlorothiazide  (BENICAR  HCT) 40-25 MG tablet, Take 1 tablet by mouth daily., Disp: , Rfl:    OZEMPIC, 0.25 OR 0.5 MG/DOSE, 2 MG/3ML SOPN, Inject 0.25 mg into the skin once a week. For 4 weeks.Then increase to 0.5 mg weekly., Disp: , Rfl:    Potassium Chloride  ER 20 MEQ TBCR, Take 1 tablet by mouth daily., Disp: , Rfl:    tamsulosin  (FLOMAX ) 0.4 MG CAPS capsule, Take 0.4 mg by mouth daily., Disp: , Rfl:    torsemide  (DEMADEX ) 20 MG tablet, TAKE 1 TABLET BY MOUTH EVERY DAY. MAY TAKE AN ADDITIONAL TABLET AS NEEDED, Disp: 135 tablet, Rfl: 3   Vitals   Vitals:   08/04/23 2049 08/04/23 2100 08/04/23 2115 08/04/23 2130  BP:  125/64 (!) 144/91 (!) 150/70  Pulse: 76 74 72 64  Resp: 14 16 18 19   Temp:      TempSrc:      SpO2: 95% 96% 97% 95%  Weight:  Height:         Body mass index is 42.59 kg/m.  Physical Exam    Constitutional: Appears well-developed and well-nourished.  Psych: Affect appropriate to situation.  Eyes: No scleral injection.  HENT: No OP obstruction.  Head: Normocephalic.  Cardiovascular: Normal rate and regular rhythm.  Respiratory: Effort normal, non-labored breathing.  GI: Soft.  No distension. There is no tenderness.    Neurologic Examination    Neuro: Mental Status: Patient is awake, alert, oriented to person, place, month, year, and situation. Patient is able to give a clear and coherent history. No signs of aphasia or neglect Cranial Nerves: II: Visual Fields are full. Pupils are round, and reactive to light, left pupil is slightly larger than the right pupil on application of the light but both are reactive.   III,IV, VI: EOMI without ptosis, however he continues to have some intermittent diplopia (vertical), and has persistent direction changing nystagmus V: Facial sensation is symmetric to light touch VII: Facial movement is symmetric.  VIII: hearing is intact to voice X: Uvula elevates symmetrically XI: Shoulder shrug is symmetric. XII: tongue is midline without atrophy or fasciculations.  Motor: Tone is normal. Bulk is normal. 5/5 strength was present in all four extremities.  Sensory: Sensation is symmetric to light touch and temperature in the arms and legs. Cerebellar: FNF and HKS are intact bilaterally Gait deferred for safety given TNK   Labs   CBC:  Recent Labs  Lab 08/04/23 1947 08/04/23 1951  WBC 6.8  --   NEUTROABS 5.0  --   HGB 14.8 16.0  HCT 47.1 47.0  MCV 84.4  --   PLT 121*  --    Basic Metabolic Panel:  Lab Results  Component Value Date   NA 136 08/04/2023   K 3.6 08/04/2023   CO2 27 08/04/2023   GLUCOSE 220 (H) 08/04/2023   BUN 15 08/04/2023   CREATININE 1.10 08/04/2023   CALCIUM  9.5 08/04/2023   GFRNONAA >60 08/04/2023   GFRAA 69 02/14/2020   Lipid Panel:  Lab Results  Component Value Date   LDLCALC 122 (H)  01/04/2013   HgbA1c:  Lab Results  Component Value Date   HGBA1C 7.3 (H) 09/19/2022   Urine Drug Screen:     Component Value Date/Time   LABOPIA NONE DETECTED 08/04/2023 1943   COCAINSCRNUR POSITIVE (A) 08/04/2023 1943   LABBENZ NONE DETECTED 08/04/2023 1943   AMPHETMU NONE DETECTED 08/04/2023 1943   THCU NONE DETECTED 08/04/2023 1943   LABBARB NONE DETECTED 08/04/2023 1943    Alcohol Level     Component Value Date/Time   ETH <10 08/04/2023 1947   INR  Lab Results  Component Value Date   INR 0.9 08/04/2023   APTT  Lab Results  Component Value Date   APTT 26 08/04/2023     CT Head without contrast(Personally reviewed): 1. Question hyperdense left A2 ACA, which could be artifactual or represent thrombus. Recommend CTA head/neck. 2. No evidence of acute large vascular territory infarct or acute hemorrhage. ASPECTS is 10. 3. Chronic microvascular ischemic disease.  CT angio Head and Neck with contrast(Personally reviewed): 1. No large vessel occlusion or proximal high-grade stenosis. 2. The hyperdense vessel seen on CT head correlates with a prominent but patent azygous ACA, anatomic variant. 3. No convincing evidence of core infarct or penumbra.   Assessment   Josehua Hammar Quaranta is a 73 y.o. male whose clinical history and examination are concerning for acute posterior fossa stroke, likely brainstem  localization.  Significant vascular risk factors listed below  Primary Diagnosis:  Cerebral infarction, unspecified.  Secondary Diagnosis: Hypertension, hyperlipidemia, BMI 36.49, diabetes, former smoking, ongoing cocaine use  Recommendations  # stroke determined by clinical assessment s/p TNK - Stroke labs HgbA1c, fasting lipid panel - MRI brain 24 hours post TNK (~2024 on 1/4)  - Frequent neuro checks per protocol  - Echocardiogram - Hold antiplatelets and chemoDVT ppx for 24 hours until post TNK head imaging completed  - SCDs for DVT PPx  - Risk factor  modification - Telemetry monitoring - Blood pressure goal  - Post TNK for 24  hours < 180/105 - PT consult, OT consult, Speech consult, unless patient is back to baseline - Admission to stroke team  # Cocaine - Cessation counseling, not discussed at this time given family at bedside  # Diabetes - Patient reports insulin  pump in place which will need to be removed prior to MRI; he is reluctant to remove it he will need a replacement - Follow-up A1c as above ______________________________________________________________________   Bonney Lola Jernigan MD-PhD [facility] [phone]   CRITICAL CARE Performed by: Lola LITTIE Jernigan   Total critical care time: 40 minutes  Critical care time was exclusive of separately billable procedures and treating other patients.  Critical care was necessary to treat or prevent imminent or life-threatening deterioration.  Critical care was time spent personally by me on the following activities: development of treatment plan with patient and/or surrogate as well as nursing, discussions with consultants, evaluation of patient's response to treatment, examination of patient, obtaining history from patient or surrogate, ordering and performing treatments and interventions, ordering and review of laboratory studies, ordering and review of radiographic studies, pulse oximetry and re-evaluation of patient's condition.

## 2023-08-04 NOTE — ED Notes (Signed)
 Pt left to [facility] via [facility]

## 2023-08-04 NOTE — Consult Note (Signed)
 [facility] Consult Services   Patient Name:   Vincent Ramos, Vincent Ramos Date of Birth:   23-Mar-1951 Identification Number:   MRN - [id number] Date of Service:   08/04/2023 19:48:41  Diagnosis:       I63.89 - Cerebrovascular accident (CVA) due to other mechanism [facility])  Impression:      73 year old male history of hypertension, diabetes, CHF, CAD on aspirin  81 mg who I am seeing as a stroke alert for double vision, unsteady gait and slurring speech.    Patient being seen for sudden onset double vision, unsteady gait, slurring speech starting at 4 PM today. Neuroexam showing persisting horizontal double vision, patient has to close 1 eye to become single vision. He has disconjugate gaze on left and right lateral gaze. No focal weakness in the arms or legs. Speech is mildly dysarthric. CT head negative for acute infarct but did show questionable left ACA thrombus however CT angiogram showing no large vessel occlusion and CT perfusion without acute ischemia.    At this time I do have concern for brainstem acute stroke.I discussed option of thrombolytics with patient and family. Patient and family denied recent surgery in last 1 month, blood thinner use or history of intracranial hemorrhage, recent GI or GU bleeding.  I explained bleeding risk of approximately 5% with thrombolytic which can be in the internal organs or rarely bleeding in the brain. I also explained benefit of improving current disabling symptoms if secondary to stroke. Patient understood risk and benefit of thrombolytic and agreed to administration.TNK given at 2024.    Patient should be admitted for post TNK monitoring, keep blood pressure less than 180/105 for 24 hours, MRI brain Noncon, lipids, A1c        Our recommendations are outlined below. Recommendations: IV Tenecteplase  recommended.  I confirmed the following. (Patient name, DOB, MRN, Blood Pressure, dose of Thrombolytic and waste, weight  completed by stretcher/scale not stated weight, have ED staff inform ED MD of thrombolytic decision)  IV Tenecteplase  Total Dose - 25.0 mg   Routine post Thrombolytic monitoring including neuro checks and blood pressure control during/after treatment Monitor blood pressure Check blood pressure and neuro assessment every 15 min for 2 h, then every 30 min for 6 h, and finally every hour for 16 h.  Manage Blood Pressure per post Thrombolytic protocol.        Follow designated hospital protocol for admission and post thrombolytic care       CT brain 24 hours post Thrombolytic       NPO until swallowing screen performed and passed       No antiplatelet agents or anticoagulants (including heparin  for DVT prophylaxis) in first 24 hours       No Foley catheter, nasogastric tube, arterial catheter or central venous catheter for 24 hr, unless absolutely necessary       Telemetry       Bedside swallow evaluation       HOB less than 30 degrees       Euglycemia       Avoid hyperthermia, PRN acetaminophen        DVT prophylaxis       Inpatient Neurology Consultation       Stroke evaluation as per inpatient neurology recommendations      MRI brain non con  Discussed with ED physician    ------------------------------------------------------------------------------  Advanced Imaging: CTA Head and Neck Completed.  CTP Completed.  LVO:No  Patient in not a candidate for NIR  Metrics: Last Known Well: 08/04/2023 16:00:00 Dispatch Time: 08/04/2023 19:48:41 Arrival Time: 08/04/2023 19:29:00 Initial Response Time: 08/04/2023 19:54:18 Symptoms: double vision, unsteady gait . Initial patient interaction: 08/04/2023 19:58:42 NIHSS Assessment Completed: 08/04/2023 19:58:43 Patient is a candidate for Thrombolytic. Thrombolytic Medical Decision: 08/04/2023 20:19:09 Needle Time: 08/04/2023 20:24:53 Weight Noted by Staff: 121 kg  CT head showed no acute hemorrhage or acute core  infarct.  Primary Provider Notified of Diagnostic Impression and Management Plan on: 08/04/2023 20:31:17    ------------------------------------------------------------------------------  Thrombolytic Contraindications:  Last Known Well > 4.5 hours: No CT Head showing hemorrhage: No Ischemic stroke within 3 months: No Severe head trauma within 3 months: No Intracranial/intraspinal surgery within 3 months: No History of intracranial hemorrhage: No Symptoms and signs consistent with an SAH: No GI malignancy or GI bleed within 21 days: No Coagulopathy: Platelets <100 000 /mm3, INR >1.7, aPTT>40 s, or PT >15 s: No Treatment dose of LMWH within the previous 24 hrs: No Use of NOACs in past 48 hours: No Glycoprotein IIb/IIIa receptor inhibitors use: No Symptoms consistent with infective endocarditis: No Suspected aortic arch dissection: No Intra-axial intracranial neoplasm: No  Thrombolytic Decision and Management Plan: Management with thrombolytic treatment was explained to the Patient and Family as was risks and benefits and alternatives to the treatment. Patient agrees with the decision to proceed with thrombolytic treatment. . All questions were answered and the Patient and Family expressed understanding of the treatment plan.   History of Present Illness: Patient is a 73 year old Male.  Patient was brought by private transportation with symptoms of double vision, unsteady gait . 73 year old male history of hypertension, diabetes, CHF, CAD on aspirin  81 mg who I am seeing as a stroke alert for double vision, unsteady gait and slurring speech.  Patient states that at around 4 PM today he is began to have double vision where he was seeing everything side-by-side. He also was staggering when he was walking. He denied any significant vertigo. Wife states that his speech is a little slurred as well. He came to the hospital and blood pressure 167/64 on arrival, blood glucose 220. Patient  is accompanied with wife the denied recent surgery, anticoagulant use, recent blood in the urine or stool, no history of intracranial hemorrhage. He denies any weakness in his arms or legs.   Past Medical History:      Hypertension      Diabetes Mellitus      There is no history of Stroke  Medications:  No Anticoagulant use  Antiplatelet use: Yes asa 81 Reviewed EMR for current medications  Allergies:  Reviewed  Social History: Drug Use: No  Family History:  There is no family history of premature cerebrovascular disease pertinent to this consultation  ROS : 14 Points Review of Systems was performed and was negative except mentioned in HPI.  Past Surgical History: There Is No Surgical History Contributory To Today's Visit   Examination: BP(148/65), Pulse(70), Blood Glucose(220) 1A: Level of Consciousness - Alert; keenly responsive + 0 1B: Ask Month and Age - Both Questions Right + 0 1C: Blink Eyes & Squeeze Hands - Performs Both Tasks + 0 2: Test Horizontal Extraocular Movements - Normal + 0 3: Test Visual Fields - No Visual Loss + 0 4: Test Facial Palsy (Use Grimace if Obtunded) - Normal symmetry + 0 5A: Test Left Arm Motor Drift - No Drift for 10 Seconds + 0 5B: Test Right Arm Motor Drift - No Drift for 10 Seconds + 0  6A: Test Left Leg Motor Drift - No Drift for 5 Seconds + 0 6B: Test Right Leg Motor Drift - No Drift for 5 Seconds + 0 7: Test Limb Ataxia (FNF/Heel-Shin) - Ataxia in 1 Limb + 1 8: Test Sensation - Normal; No sensory loss + 0 9: Test Language/Aphasia - Normal; No aphasia + 0 10: Test Dysarthria - Mild-Moderate Dysarthria: Slurring but can be understood + 1 11: Test Extinction/Inattention - No abnormality + 0  NIHSS Score: 2 NIHSS Free Text : double vision , dysconjugate gaze on lateral eye movements  Pre-Morbid Modified Rankin Scale: Unable to assess  Spoke with : Dr Franklyn  This consult was conducted in real time using interactive audio and  immunologist. Patient was informed of the technology being used for this visit and agreed to proceed. Patient located in hospital and provider located at home/office setting.   Patient is being evaluated for possible acute neurologic impairment and high probability of imminent or life-threatening deterioration. I spent total of 50 minutes providing care to this patient, including time for face to face visit via telemedicine, review of medical records, imaging studies and discussion of findings with providers, the patient and/or family.   Dr Oneil Evert   [facility] For Inpatient follow-up with [facility] physician please call [facility] at [phone]. As we are not an outpatient service for any post hospital discharge needs please contact the hospital for assistance.  If you have any questions for the [facility] physicians or need to reconsult for clinical or diagnostic changes please contact us  via [facility] at [phone].

## 2023-08-04 NOTE — Progress Notes (Signed)
 CODE STROKE 1944: Code stroke cart activated at this time. EDP assessed patient prior to code stroke activation.  1950: Patient left for CT.  1948: TSP paged at this time.  2015: Patient returned from CT.  1955: Dr. Gwynda on camera at this time. Patient report and history provided to TSP.  2008: NCCT imaging results provided to TSP on camera.  1956: TSP performing neuro assessment.  2032: CTA/CTP imaging results provided to TSP via secure messaging.    TNK ADMINISTRATION Per TSMD, pt met criteria for TNK administration.  2004 TSMD explained risks/benefits of TNK administration to pt. 2019 Pt indicates that they understood and consents to TNK administration. 2023 Timeout performed with TSMD, primary RN, and TSRN with confirmation of pt's weight and TNK dosage of 25mg / 5mL. 2024 Initial 10mL normal saline flush bolus administered. 2024 TNK 25mg /14mL administered. 2025 Second 10mL normal saline flush bolus administered. Pt was informed to notify treatment team of worsening of symptoms, headache, nausea, or signs of an allergic reaction. Pt indicates that they understand. TSRN remains on stroke cart with pt for post TNK administration monitoring. Pt remains on continuous cardiac, BP, and pulse ox monitoring for ongoing assessment. 2058 TSRN completed post TNK monitoring of patient. NAD was noted at this time. Primary RN remains at bedside for ongoing monitoring. Pt pending ICU bed at this time. No further needs from [facility] and stroke cart disconnected at this time.

## 2023-08-04 NOTE — ED Notes (Signed)
 Patient transported to CT with Tobi Bastos, RN and Asher Muir, RN

## 2023-08-04 NOTE — ED Provider Notes (Signed)
 [place] EMERGENCY DEPARTMENT AT [facility] Provider Note   CSN: [id number] Arrival date & time: 08/04/23  1929  An emergency department physician performed an initial assessment on this suspected stroke patient at 63.  History  Chief Complaint  Patient presents with   Numbness   Loss of Vision    Vincent Ramos is a 73 y.o. male with PMH as listed below who presents with stroke-like symptoms.  Patient LKN 4:00 PM. Sometime after 4 pm had acute onset double vision, slurred speech, and balance trouble. No falls/head trauma, recent illnesses, asymmetric weakness/numbness/tingling. Doesn't take blood thinners. No h/o CVA. No reported alcohol or illicit substances. No h/o similar.  Past Medical History:  Diagnosis Date   Abnormal nuclear cardiac imaging test 12/27/2017   Acute on chronic diastolic CHF (congestive heart failure) (HCC) 09/17/2017   Acute respiratory failure with hypoxia (HCC) 06/01/2017   Arthritis    Asthma    CAD (coronary artery disease)    a. 11/2017: cath showing widely patent coronary arteries with normal left main, 30% mid LAD, luminal irregularities in the proximal and mid circumflex, and 30 to 40% stenosis in the mid RCA.   CAP (community acquired pneumonia) 07/31/2014   Community acquired pneumonia 07/31/2014   Constipation 02/06/2018   COPD exacerbation (HCC) 09/17/2017   Dysphagia    Elevated troponin 09/17/2017   Enlarged prostate    Grade II diastolic dysfunction    Hypertension    Lumbar stenosis 10/07/2015   Obesity 07/31/2014   PNA (pneumonia) 05/30/2017   Pneumonia    Pneumonia    Polyp of rectum    Respiratory failure (HCC) 09/18/2017   SBO (small bowel obstruction) (HCC) 05/08/2019   Shortness of breath    Sleep apnea    Sleep Study 08/21/17: Severe OSA   Type 2 diabetes mellitus (HCC)        Home Medications Prior to Admission medications   Medication Sig Start Date End Date Taking? Authorizing Provider  albuterol  (VENTOLIN   HFA) 108 (90 Base) MCG/ACT inhaler Inhale 2 puffs into the lungs every 6 (six) hours as needed for wheezing or shortness of breath.   Yes [provider]  aspirin  EC 81 MG tablet Take 1 tablet (81 mg total) by mouth daily with breakfast. 05/12/19  Yes Emokpae, Courage, MD  gabapentin  (NEURONTIN ) 300 MG capsule Take 300 mg by mouth daily. 09/25/19  Yes [provider]  glipiZIDE  (GLUCOTROL ) 10 MG tablet Take 10 mg by mouth 2 (two) times daily before a meal. 07/04/20  Yes [provider]  hydrALAZINE  (APRESOLINE ) 100 MG tablet Take 100 mg by mouth daily at 12 noon. Pt takes 1 tablet by mouth daily.   Yes [provider]  isosorbide  mononitrate (IMDUR ) 30 MG 24 hr tablet Take 1 tablet (30 mg total) by mouth daily. Can take an additional tab as needed 05/11/23  Yes Dick, Jackee VEAR Raddle., NP  nitroGLYCERIN  (NITROSTAT ) 0.4 MG SL tablet Place 1 tablet (0.4 mg total) under the tongue every 5 (five) minutes as needed. 02/15/23 08/04/23 Yes Wyn Jackee VEAR Raddle., NP  olmesartan -hydrochlorothiazide  (BENICAR  HCT) 40-25 MG tablet Take 1 tablet by mouth daily. 01/24/23  Yes [provider]  OZEMPIC, 0.25 OR 0.5 MG/DOSE, 2 MG/3ML SOPN Inject 0.25 mg into the skin once a week. For 4 weeks.Then increase to 0.5 mg weekly. 06/22/23  Yes [provider]  Potassium Chloride  ER 20 MEQ TBCR Take 1 tablet by mouth daily. 03/04/23  Yes [provider]  tamsulosin  (FLOMAX ) 0.4 MG CAPS capsule Take 0.4 mg by mouth daily. 09/04/19  Yes [provider]  torsemide  (DEMADEX ) 20 MG tablet TAKE 1 TABLET BY MOUTH EVERY DAY. MAY TAKE AN ADDITIONAL TABLET AS NEEDED 03/10/23  Yes Wyn Jackee VEAR Mickey., NP      Allergies    Amlodipine , Novolog  [insulin  aspart (human analog)], and Victoza [liraglutide]    Review of Systems   Review of Systems A 10 point review of systems was performed and is negative unless otherwise reported in HPI.  Physical Exam Updated Vital Signs BP (!) 117/57    Pulse 67   Temp 98.2 F (36.8 C)   Resp 13   Ht 5' 6.5 (1.689 m)   Wt 121.5 kg   SpO2 94%   BMI 42.59 kg/m  Physical Exam General: Normal appearing obese male, lying in bed.  HEENT: PERRLA,  Dysconjugate gate with multi-directional nystagmus, worse on left. Horizontal diplopia reported with patient requiring that he cover one eye to have single vision again. Sclera anicteric, MMM, trachea midline. Tongue protrudes midline. Symmetric smile and eyebrow raise. Cardiology: RRR, no murmurs/rubs/gallops.  Resp: Normal respiratory rate and effort. CTAB, no wheezes, rhonchi, crackles.  Abd: Soft, non-tender, non-distended. No rebound tenderness or guarding.  GU: Deferred. MSK: No peripheral edema or signs of trauma. Extremities without deformity or TTP.  Skin: warm, dry.  Neuro: A&Ox4, mildly slurred speech. CNs II-XII grossly intact. 5/5 strength in all extremities. Sensation grossly intact.  Psych: Normal mood and affect.   1a  Level of consciousness: 0=alert; keenly responsive  1b. LOC questions:  0=Performs both tasks correctly  1c. LOC commands: 0=Performs both tasks correctly  2.  Best Gaze: 1=partial gaze palsy  3.  Visual: 0=No visual loss  4. Facial Palsy: 0=Normal symmetric movement  5a.  Motor left arm: 0=No drift, limb holds 90 (or 45) degrees for full 10 seconds  5b.  Motor right arm: 0=No drift, limb holds 90 (or 45) degrees for full 10 seconds  6a. motor left leg: 0=No drift, limb holds 90 (or 45) degrees for full 10 seconds  6b  Motor right leg:  0=No drift, limb holds 90 (or 45) degrees for full 10 seconds  7. Limb Ataxia: 1=present in one limb  8.  Sensory: 0=Normal; no sensory loss  9. Best Language:  0=No aphasia, normal  10. Dysarthria: 1=Mild to moderate, patient slurs at least some words and at worst, can be understood with some difficulty  11. Extinction and Inattention: 0=No abnormality   Total:   3        ED Results / Procedures / Treatments    Labs (all labs ordered are listed, but only abnormal results are displayed) Labs Reviewed  CBC - Abnormal; Notable for the following components:      Result Value   Platelets 121 (*)    All other components within normal limits  COMPREHENSIVE METABOLIC PANEL - Abnormal; Notable for the following components:   Glucose, Bld 222 (*)    Total Bilirubin 1.3 (*)    All other components within normal limits  RAPID URINE DRUG SCREEN, HOSP PERFORMED - Abnormal; Notable for the following components:   Cocaine POSITIVE (*)    All other components within normal limits  I-STAT CHEM 8, ED - Abnormal; Notable for the following components:   Chloride 97 (*)    Glucose, Bld 220 (*)    All other components within normal limits  CBG MONITORING, ED - Abnormal;  Notable for the following components:   Glucose-Capillary 221 (*)    All other components within normal limits  ETHANOL  PROTIME-INR  APTT  DIFFERENTIAL  URINALYSIS, ROUTINE W REFLEX MICROSCOPIC    EKG EKG Interpretation Date/Time:  Friday August 04 2023 19:42:22 EST Ventricular Rate:  69 PR Interval:  136 QRS Duration:  90 QT Interval:  431 QTC Calculation: 462 R Axis:   -12  Text Interpretation: Normal sinus rhythm Anterior infarct, old Minimal ST elevation, lateral leads Confirmed by Franklyn Gills [phone]) on 08/04/2023 8:25:22 PM  Radiology CT ANGIO HEAD NECK W WO CM W PERF (CODE STROKE) Result Date: 08/04/2023 CLINICAL DATA:  Stroke/TIA, determine embolic source double vision, vertigo EXAM: CT ANGIOGRAPHY HEAD AND NECK CT PERFUSION BRAIN TECHNIQUE: Multidetector CT imaging of the head and neck was performed using the standard protocol during bolus administration of intravenous contrast. Multiplanar CT image reconstructions and MIPs were obtained to evaluate the vascular anatomy. Carotid stenosis measurements (when applicable) are obtained utilizing NASCET criteria, using the distal internal carotid diameter as the denominator.  Multiphase CT imaging of the brain was performed following IV bolus contrast injection. Subsequent parametric perfusion maps were calculated using RAPID software. RADIATION DOSE REDUCTION: This exam was performed according to the departmental dose-optimization program which includes automated exposure control, adjustment of the mA and/or kV according to patient size and/or use of iterative reconstruction technique. CONTRAST:  OMNIPAQUE  IOHEXOL  350 MG/ML SOLN COMPARISON:  CT head from the study. FINDINGS: CTA NECK FINDINGS Aortic arch: Great vessel origins are patent without significant stenosis. Aortic atherosclerosis. Right carotid system: Atherosclerosis without significant (greater than 50%) stenosis. Tortuous ICA. Left carotid system: Atherosclerosis without significant (greater than 50%) stenosis. Vertebral arteries: Codominant. No evidence of dissection, stenosis (50% or greater) or occlusion. Skeleton: Multilevel ACDF.  No acute abnormality limited assessment. Other neck: No acute abnormality on limited assessment. Upper chest: Visualized lung apices are clear. Review of the MIP images confirms the above findings CTA HEAD FINDINGS Anterior circulation: Bilateral intracranial ICAs, MCAs and ACAs are patent without proximal hemodynamically significant stenosis. Prominent azygous ACA, anatomic variant. Posterior circulation: Bilateral intradural vertebral arteries, basilar artery and bilateral fissural arteries are patent without proximal hemodynamically significant stenosis. Venous sinuses: As permitted by contrast timing, patent. Anatomic variants: Detailed above. Review of the MIP images confirms the above findings CT Brain Perfusion Findings: ASPECTS: 10. CBF (<30%) Volume: 0mL Perfusion (Tmax>6.0s) volume: 5mL Mismatch Volume: 5mL, which is likely artifactual given location in the inferior right temporal lobe where there is commonly streak artifact. This also does not correlate with a vascular  territory. Infarction Location:None identified IMPRESSION: 1. No large vessel occlusion or proximal high-grade stenosis. 2. The hyperdense vessel seen on CT head correlates with a prominent but patent azygous ACA, anatomic variant. 3. No convincing evidence of core infarct or penumbra. Electronically Signed   By: Gilmore GORMAN Molt M.D.   On: 08/04/2023 20:26   CT HEAD CODE STROKE WO CONTRAST Result Date: 08/04/2023 CLINICAL DATA:  Code stroke.  Neuro deficit, acute, stroke suspected EXAM: CT HEAD WITHOUT CONTRAST TECHNIQUE: Contiguous axial images were obtained from the base of the skull through the vertex without intravenous contrast. RADIATION DOSE REDUCTION: This exam was performed according to the departmental dose-optimization program which includes automated exposure control, adjustment of the mA and/or kV according to patient size and/or use of iterative reconstruction technique. COMPARISON:  None Available. FINDINGS: Brain: No evidence of acute large vascular territory infarction, hemorrhage, hydrocephalus, extra-axial collection or mass lesion/mass  effect. Patchy white matter hypodensities are nonspecific but compatible with chronic microvascular ischemic disease. Vascular: Question hyperdense left A2 ACA. Skull: No acute fracture. Sinuses/Orbits: No acute finding. Remote appearing right medial orbital wall fracture. ASPECTS [facility] Stroke Program Early CT Score) Total score (0-10 with 10 being normal): 10. IMPRESSION: 1. Question hyperdense left A2 ACA, which could be artifactual or represent thrombus. Recommend CTA head/neck. 2. No evidence of acute large vascular territory infarct or acute hemorrhage. ASPECTS is 10. 3. Chronic microvascular ischemic disease. Code stroke imaging results were communicated on 08/04/2023 at 8:04 pm to provider Dr. Franklyn Via telephone, who verbally acknowledged these results. Electronically Signed   By: Gilmore GORMAN Molt M.D.   On: 08/04/2023 20:05     Procedures .Critical Care  Performed by: Franklyn Sid SAILOR, MD Authorized by: Franklyn Sid SAILOR, MD   Critical care provider statement:    Critical care time (minutes):  30   Critical care was necessary to treat or prevent imminent or life-threatening deterioration of the following conditions:  CNS failure or compromise   Critical care was time spent personally by me on the following activities:  Development of treatment plan with patient or surrogate, discussions with consultants, evaluation of patient's response to treatment, examination of patient, ordering and review of laboratory studies, ordering and review of radiographic studies, ordering and performing treatments and interventions, pulse oximetry, re-evaluation of patient's condition and review of old charts   Care discussed with: accepting provider at another facility       Medications Ordered in ED Medications  iohexol  (OMNIPAQUE ) 350 MG/ML injection 100 mL (100 mLs Intravenous Contrast Given 08/04/23 2007)  tenecteplase  (TNKASE ) injection for Stroke 25 mg (25 mg Intravenous Given 08/04/23 2024)    ED Course/ Medical Decision Making/ A&P                          Medical Decision Making Amount and/or Complexity of Data Reviewed Labs: ordered. Decision-making details documented in ED Course. Radiology: ordered.  Risk Decision regarding hospitalization.    This patient presents to the ED for concern of stroke-like symptoms, this involves an extensive number of treatment options, and is a complaint that carries with it a high risk of complications and morbidity.  I considered the following differential and admission for this acute, potentially life threatening condition.   MDM:    Given the acute onset of neurological symptoms, stroke is the most concerning etiology of these acute symptoms. The neuro exam is significant for binocular diplopia reported with significant nystagmus, mild slurred speech, and mild ataxia. Also  considered peripheral vertigo, electrolyte derangements, dehydration, or toxic ingestion as reported cause though denies illicit substances or alcohol. No head trauma reported.  Teleneurology team evaluating patient at bedside. Plan to obtain emergent CT brain.  LKN: 4:00 pm Glucose: 222 mg/dL AC: No BP: 849/28   Plan: - stat head CT, consider additional imaging including CTA pending initial CT  - teleneurology has been consulted  - consider TNK if neg head CT - Manage hypertension as needed -- sBP goal 140-160 if ICH  -- if TNK given, will keep sBP <180 and dBP<105 - labs & other orders as below   Clinical Course as of 08/04/23 2148  Fri Aug 04, 2023  2024 Glucose-Capillary(!): 221 [HN]  2024 CBC(!) Unremarkable in the context of this patient's presentation  [HN]  2024 Alcohol, Ethyl (B): <10 neg [HN]  2024 Comprehensive metabolic panel(!) Unremarkable in the context  of this patient's presentation  [HN]  2024 [facility] with possible hyperdensity in A2, radiology recommended CTA [HN]  2032 D/w neurologist Dr.  Gwynda who also felt etiology likely ischemic. He discussed risks and benefits with patient and his wife, who wanted TNK. TNK was administered. Patient will be admitted to neuro ICU at [facility]. [HN]    Clinical Course User Index [HN] Franklyn Sid SAILOR, MD    Labs: I Ordered, and personally interpreted labs.  The pertinent results include:  Those listed above  Imaging Studies ordered: Imaging studies including CTH, CTA H&N were ordered I independently visualized and interpreted imaging. I agree with the radiologist interpretation  Additional history obtained from chart review, wife at bedside.    Cardiac Monitoring: The patient was maintained on a cardiac monitor.  I personally viewed and interpreted the cardiac monitored which showed an underlying rhythm of: NSR  Reevaluation: After the interventions noted above, I reevaluated the patient and found that they have  :stayed the same  Social Determinants of Health: Lives independently  Disposition:  Admitted to neuro  Co morbidities that complicate the patient evaluation  Past Medical History:  Diagnosis Date   Abnormal nuclear cardiac imaging test 12/27/2017   Acute on chronic diastolic CHF (congestive heart failure) (HCC) 09/17/2017   Acute respiratory failure with hypoxia (HCC) 06/01/2017   Arthritis    Asthma    CAD (coronary artery disease)    a. 11/2017: cath showing widely patent coronary arteries with normal left main, 30% mid LAD, luminal irregularities in the proximal and mid circumflex, and 30 to 40% stenosis in the mid RCA.   CAP (community acquired pneumonia) 07/31/2014   Community acquired pneumonia 07/31/2014   Constipation 02/06/2018   COPD exacerbation (HCC) 09/17/2017   Dysphagia    Elevated troponin 09/17/2017   Enlarged prostate    Grade II diastolic dysfunction    Hypertension    Lumbar stenosis 10/07/2015   Obesity 07/31/2014   PNA (pneumonia) 05/30/2017   Pneumonia    Pneumonia    Polyp of rectum    Respiratory failure (HCC) 09/18/2017   SBO (small bowel obstruction) (HCC) 05/08/2019   Shortness of breath    Sleep apnea    Sleep Study 08/21/17: Severe OSA   Type 2 diabetes mellitus (HCC)      Medicines Meds ordered this encounter  Medications   tenecteplase  (TNKASE ) 50 MG injection for Stroke    Montgomery Kung A: cabinet override   iohexol  (OMNIPAQUE ) 350 MG/ML injection 100 mL   tenecteplase  (TNKASE ) injection for Stroke 25 mg    I have reviewed the patients home medicines and have made adjustments as needed  Problem List / ED Course: Problem List Items Addressed This Visit   None Visit Diagnoses       Diplopia    -  Primary     Balance problem         Stroke-like symptoms                       This note was created using dictation software, which may contain spelling or grammatical errors.    Franklyn Sid SAILOR, MD 08/04/23 [phone]

## 2023-08-05 ENCOUNTER — Inpatient Hospital Stay (HOSPITAL_COMMUNITY): Payer: Medicare Other

## 2023-08-05 DIAGNOSIS — I639 Cerebral infarction, unspecified: Secondary | ICD-10-CM

## 2023-08-05 DIAGNOSIS — I6389 Other cerebral infarction: Secondary | ICD-10-CM

## 2023-08-05 LAB — ECHOCARDIOGRAM COMPLETE
AR max vel: 2.9 cm2
AV Peak grad: 5.4 mm[Hg]
Ao pk vel: 1.16 m/s
Area-P 1/2: 3.85 cm2
Height: 66.5 in
S' Lateral: 3.5 cm
Weight: 3671.98 [oz_av]

## 2023-08-05 LAB — GLUCOSE, CAPILLARY
Glucose-Capillary: 265 mg/dL — ABNORMAL HIGH (ref 70–99)
Glucose-Capillary: 216 mg/dL — ABNORMAL HIGH (ref 70–99)
Glucose-Capillary: 187 mg/dL — ABNORMAL HIGH (ref 70–99)

## 2023-08-05 LAB — LIPID PANEL
Cholesterol: 126 mg/dL (ref 0–200)
HDL: 37 mg/dL — ABNORMAL LOW (ref >40)
LDL Cholesterol: 73 mg/dL (ref 0–99)
Total CHOL/HDL Ratio: 3.4 {ratio}
Triglycerides: 82 mg/dL (ref <150)
VLDL: 16 mg/dL (ref 0–40)

## 2023-08-05 LAB — MRSA NEXT GEN BY PCR, NASAL: MRSA by PCR Next Gen: NOT DETECTED

## 2023-08-05 MED ORDER — PANTOPRAZOLE SODIUM 40 MG PO TBEC
40.0000 mg | DELAYED_RELEASE_TABLET | Freq: Every day | ORAL | Status: DC
  Administered 2023-08-05: 40 mg via ORAL
  Filled 2023-08-05: qty 1

## 2023-08-05 MED ORDER — POTASSIUM CHLORIDE CRYS ER 20 MEQ PO TBCR
20.0000 meq | EXTENDED_RELEASE_TABLET | Freq: Every day | ORAL | Status: DC
  Administered 2023-08-05 – 2023-08-06 (×2): 20 meq via ORAL
  Filled 2023-08-05 (×2): qty 1

## 2023-08-05 MED ORDER — HYDROCHLOROTHIAZIDE 25 MG PO TABS
25.0000 mg | ORAL_TABLET | Freq: Every day | ORAL | Status: DC
  Administered 2023-08-05 – 2023-08-06 (×2): 25 mg via ORAL
  Filled 2023-08-05 (×2): qty 1

## 2023-08-05 MED ORDER — INSULIN ASPART 100 UNIT/ML IJ SOLN: 4.0000 [IU] | Freq: Three times a day (TID) | INTRAMUSCULAR | Status: DC

## 2023-08-05 MED ORDER — GABAPENTIN 300 MG PO CAPS
300.0000 mg | ORAL_CAPSULE | Freq: Every day | ORAL | Status: DC
  Administered 2023-08-05 – 2023-08-06 (×2): 300 mg via ORAL
  Filled 2023-08-05 (×2): qty 1

## 2023-08-05 MED ORDER — ISOSORBIDE MONONITRATE ER 30 MG PO TB24
30.0000 mg | ORAL_TABLET | Freq: Every day | ORAL | Status: DC
  Administered 2023-08-05 – 2023-08-06 (×2): 30 mg via ORAL
  Filled 2023-08-05 (×2): qty 1

## 2023-08-05 MED ORDER — ALBUTEROL SULFATE (2.5 MG/3ML) 0.083% IN NEBU: 2.5000 mg | INHALATION_SOLUTION | Freq: Four times a day (QID) | RESPIRATORY_TRACT | Status: DC | PRN

## 2023-08-05 MED ORDER — INSULIN ASPART 100 UNIT/ML IJ SOLN: 0.0000 [IU] | Freq: Every day | INTRAMUSCULAR | Status: DC

## 2023-08-05 MED ORDER — CHLORHEXIDINE GLUCONATE CLOTH 2 % EX PADS
6.0000 | MEDICATED_PAD | Freq: Every day | CUTANEOUS | Status: DC
  Administered 2023-08-05 (×2): 6 via TOPICAL

## 2023-08-05 MED ORDER — IRBESARTAN 150 MG PO TABS
300.0000 mg | ORAL_TABLET | Freq: Every day | ORAL | Status: DC
  Administered 2023-08-05 – 2023-08-06 (×2): 300 mg via ORAL
  Filled 2023-08-05 (×2): qty 2

## 2023-08-05 MED ORDER — INSULIN ASPART 100 UNIT/ML IJ SOLN
0.0000 [IU] | Freq: Three times a day (TID) | INTRAMUSCULAR | Status: DC
  Administered 2023-08-05: 5 [IU] via SUBCUTANEOUS

## 2023-08-05 MED ORDER — TAMSULOSIN HCL 0.4 MG PO CAPS
0.4000 mg | ORAL_CAPSULE | Freq: Every day | ORAL | Status: DC
  Administered 2023-08-05 – 2023-08-06 (×2): 0.4 mg via ORAL
  Filled 2023-08-05 (×2): qty 1

## 2023-08-05 MED ORDER — INSULIN ASPART 100 UNIT/ML IJ SOLN
0.0000 [IU] | Freq: Three times a day (TID) | INTRAMUSCULAR | Status: DC
  Administered 2023-08-05: 5 [IU] via SUBCUTANEOUS
  Administered 2023-08-06: 15 [IU] via SUBCUTANEOUS

## 2023-08-05 MED ORDER — HYDRALAZINE HCL 50 MG PO TABS
100.0000 mg | ORAL_TABLET | Freq: Every day | ORAL | Status: DC
  Administered 2023-08-05 – 2023-08-06 (×2): 100 mg via ORAL
  Filled 2023-08-05 (×2): qty 2

## 2023-08-05 MED ORDER — ALPRAZOLAM 0.25 MG PO TABS
0.5000 mg | ORAL_TABLET | Freq: Once | ORAL | Status: AC | PRN
  Administered 2023-08-05: 0.5 mg via ORAL
  Filled 2023-08-05: qty 2

## 2023-08-05 NOTE — Evaluation (Signed)
 Physical Therapy Evaluation Patient Details Name: Vincent Ramos MRN: [id number] DOB: 26-Jun-1951 Today's Date: 08/05/2023  History of Present Illness  Pt is a 73 y/o male admitted as a transfer from [facility] with several intermittent problems including double vision, difficulty walking and slurred speech.  Since leaving [facility] symptoms have markedly improved, except for some intermittent residual diplopia.  PMHx:  Chronic dCHF, ARF with hypoxia, Asthma, CAD, HTN, lumbar stenosis DM2 , sleep apnea.  Clinical Impression  Pt is close to baseline functioning and should be safe at home with wife's prn assist. The only problem noted was with low SpO2 with age appropriate gait activity at 80%.  Pt recovered to 90% with efficient breathing.   There are no further acute PT needs.  Will sign off at this time.         If plan is discharge home, recommend the following:  (prn assist from family)   Can travel by private vehicle        Equipment Recommendations None recommended by PT  Recommendations for Other Services       Functional Status Assessment Patient has had a recent decline in their functional status and demonstrates the ability to make significant improvements in function in a reasonable and predictable amount of time.     Precautions / Restrictions Precautions Precautions: Fall;None (low fall risk)      Mobility  Bed Mobility Overal bed mobility: Needs Assistance Bed Mobility: Supine to Sit, Sit to Supine     Supine to sit: Supervision Sit to supine: Supervision        Transfers Overall transfer level: Modified independent                      Ambulation/Gait Ambulation/Gait assistance: Supervision Gait Distance (Feet): 400 Feet Assistive device: None Gait Pattern/deviations: Step-through pattern   Gait velocity interpretation: >2.62 ft/sec, indicative of community ambulatory   General Gait Details: Generally steady, pt able to ambulate at age appropriate  levels, scanning, abruptly turning without deviation or LOB.  Pt intially though he experienced mild instability, but this therapist did not notice anything out of the norm.,  Stairs Stairs: Yes Stairs assistance: Supervision Stair Management: One rail Right, Alternating pattern, Forwards Number of Stairs: 3 General stair comments: safe with the rail  Wheelchair Mobility     Tilt Bed    Modified Rankin (Stroke Patients Only) Modified Rankin (Stroke Patients Only) Pre-Morbid Rankin Score: No symptoms Modified Rankin: No symptoms     Balance Overall balance assessment: No apparent balance deficits (not formally assessed)                                           Pertinent Vitals/Pain Pain Assessment Pain Assessment: No/denies pain    Home Living Family/patient expects to be discharged to:: Private residence Living Arrangements: Spouse/significant other Available Help at Discharge: Family;Available PRN/intermittently Type of Home: House Home Access: Stairs to enter Entrance Stairs-Rails: Doctor, General Practice of Steps: 3   Home Layout: One level Home Equipment: None      Prior Function Prior Level of Function : Independent/Modified Independent                     Extremity/Trunk Assessment   Upper Extremity Assessment Upper Extremity Assessment: Overall WFL for tasks assessed    Lower Extremity Assessment Lower Extremity Assessment: Overall [facility]  for tasks assessed    Cervical / Trunk Assessment Cervical / Trunk Assessment: Normal  Communication   Communication Communication: No apparent difficulties  Cognition Arousal: Alert Behavior During Therapy: WFL for tasks assessed/performed Overall Cognitive Status: Within Functional Limits for tasks assessed                                          General Comments General comments (skin integrity, edema, etc.): Noted during mobility assessment, pt's Sats  on RA dropped to 80 % with good PLETH, <1.5 min to return to 90% with coaching for efficient breathing.    Exercises     Assessment/Plan    PT Assessment Patient does not need any further PT services  PT Problem List         PT Treatment Interventions      PT Goals (Current goals can be found in the Care Plan section)  Acute Rehab PT Goals PT Goal Formulation: All assessment and education complete, DC therapy    Frequency       Co-evaluation               AM-PAC PT 6 Clicks Mobility  Outcome Measure Help needed turning from your back to your side while in a flat bed without using bedrails?: None Help needed moving from lying on your back to sitting on the side of a flat bed without using bedrails?: None Help needed moving to and from a bed to a chair (including a wheelchair)?: None Help needed standing up from a chair using your arms (e.g., wheelchair or bedside chair)?: None Help needed to walk in hospital room?: A Little Help needed climbing 3-5 steps with a railing? : A Little 6 Click Score: 22    End of Session   Activity Tolerance: Patient tolerated treatment well Patient left: in bed;with call bell/phone within reach;with bed alarm set Nurse Communication: Mobility status      Time: 8359-8295 PT Time Calculation (min) (ACUTE ONLY): 24 min   Charges:   PT Evaluation $PT Eval Low Complexity: 1 Low PT Treatments $Gait Training: 8-22 mins PT General Charges $$ ACUTE PT VISIT: 1 Visit 08/05/2023  [place] HERO., PT Acute Rehabilitation Services [phone]  (office)         08/05/2023  India HERO., PT Acute Rehabilitation Services [phone]  (office)  Vincent Ramos 08/05/2023, 5:19 PM

## 2023-08-05 NOTE — Progress Notes (Signed)
 Pt okay with receiving novolog sliding scale insulin despite having allergy listed in his chart. He States he has received it during prior hospitalizations and did not experience allergic reaction.   Octavia Bruckner RN

## 2023-08-05 NOTE — Progress Notes (Signed)
 SLP Cancellation Note  Patient Details Name: Vincent Ramos MRN: [id number] DOB: 07-03-1951   Cancelled treatment:       Reason Eval/Treat Not Completed: SLP screened, no needs identified, will sign off; pt stated symptoms have resolved; CT head negative; MRI pending; Please re-consult prn if speech symptoms return.   Pat Lennan Malone,M.S.,CCC-SLP 08/05/2023, 11:15 AM

## 2023-08-05 NOTE — Progress Notes (Signed)
 eLink Physician-Brief Progress Note Patient Name: Vincent Ramos DOB: 1951/06/29 MRN: [id number]   Date of Service  08/05/2023  HPI/Events of Note  73 year old male history of hypertension, diabetes, CHF, CAD on aspirin  81 mg who presented for double vision, unsteady gait and slurring speech. S/p Tenecteplase    Reviewed vitals, labs, and imaging. + UDS, no lvo  eICU Interventions  SBP goal < 180  Standard tenecteplase  precautions  Consider outpatient sleep evaluation for obstructive sleep apnea.  DVT prophylaxis held in the setting of TNK GI prophylaxis not indicated, consider discontinuation of Protonix         Devine Klingel 08/05/2023, 1:52 AM

## 2023-08-05 NOTE — Progress Notes (Addendum)
 STROKE TEAM PROGRESS NOTE   BRIEF HPI Mr. Vincent Ramos is a 73 y.o. male with past medical history significant for acute on chronic diastolic CHF, CAD, sleep apnea, type 2 diabetes, COPD with exacerbations, enlarged prostate, hypertension, obesity, small bowel obstruction presenting to [facility] 1/3 due to acute onset of double vision, difficulty walking, slurred speech.  He was treated with TNK at [facility] and transferred to [facility] for post TNK care and stroke workup. LKW: 1600 mRs: 0 IVT: 1/3 @ 2024 @ [facility] EVT: No, no LVO NIH on Admission to [facility]: 0   INTERIM HISTORY/SUBJECTIVE Admitted to 4N ICU for post TNK monitoring  Vital signs and neurological exam stable throughout the night. On exam this morning, RN at bedside.  Patient sitting up in bed.  Denies headache, shortness of breath, palpitations, numbness or tingling.  Patient complains of inconsistent double vision in his midline distant vision.  No other neurological deficits seen.   Plan of care and assessment thoroughly discussed 24-hour post TNK MRI will be completed tonight.  Transfer or possible discharge tomorrow   OBJECTIVE  CBC    Component Value Date/Time   WBC 6.8 08/04/2023 1947   RBC 5.58 08/04/2023 1947   HGB 16.0 08/04/2023 1951   HCT 47.0 08/04/2023 1951   PLT 121 (L) 08/04/2023 1947   MCV 84.4 08/04/2023 1947   MCH 26.5 08/04/2023 1947   MCHC 31.4 08/04/2023 1947   RDW 14.8 08/04/2023 1947   LYMPHSABS 1.0 08/04/2023 1947   MONOABS 0.6 08/04/2023 1947   EOSABS 0.2 08/04/2023 1947   BASOSABS 0.0 08/04/2023 1947    BMET    Component Value Date/Time   NA 136 08/04/2023 1951   NA 140 03/10/2023 1219   K 3.6 08/04/2023 1951   CL 97 (L) 08/04/2023 1951   CO2 27 08/04/2023 1947   GLUCOSE 220 (H) 08/04/2023 1951   BUN 15 08/04/2023 1951   BUN 15 03/10/2023 1219   CREATININE 1.10 08/04/2023 1951   CREATININE 1.09 01/04/2013 0804   CALCIUM  9.5 08/04/2023 1947   EGFR 64 03/10/2023  1219   GFRNONAA >60 08/04/2023 1947    IMAGING past 24 hours CT ANGIO HEAD NECK W WO CM W PERF (CODE STROKE) Result Date: 08/04/2023 CLINICAL DATA:  Stroke/TIA, determine embolic source double vision, vertigo EXAM: CT ANGIOGRAPHY HEAD AND NECK CT PERFUSION BRAIN TECHNIQUE: Multidetector CT imaging of the head and neck was performed using the standard protocol during bolus administration of intravenous contrast. Multiplanar CT image reconstructions and MIPs were obtained to evaluate the vascular anatomy. Carotid stenosis measurements (when applicable) are obtained utilizing NASCET criteria, using the distal internal carotid diameter as the denominator. Multiphase CT imaging of the brain was performed following IV bolus contrast injection. Subsequent parametric perfusion maps were calculated using RAPID software. RADIATION DOSE REDUCTION: This exam was performed according to the departmental dose-optimization program which includes automated exposure control, adjustment of the mA and/or kV according to patient size and/or use of iterative reconstruction technique. CONTRAST:  100mL OMNIPAQUE  IOHEXOL  350 MG/ML SOLN COMPARISON:  CT head from the study. FINDINGS: CTA NECK FINDINGS Aortic arch: Great vessel origins are patent without significant stenosis. Aortic atherosclerosis. Right carotid system: Atherosclerosis without significant (greater than 50%) stenosis. Tortuous ICA. Left carotid system: Atherosclerosis without significant (greater than 50%) stenosis. Vertebral arteries: Codominant. No evidence of dissection, stenosis (50% or greater) or occlusion. Skeleton: Multilevel ACDF.  No acute abnormality limited assessment. Other neck: No acute abnormality on  limited assessment. Upper chest: Visualized lung apices are clear. Review of the MIP images confirms the above findings CTA HEAD FINDINGS Anterior circulation: Bilateral intracranial ICAs, MCAs and ACAs are patent without proximal hemodynamically significant  stenosis. Prominent azygous ACA, anatomic variant. Posterior circulation: Bilateral intradural vertebral arteries, basilar artery and bilateral fissural arteries are patent without proximal hemodynamically significant stenosis. Venous sinuses: As permitted by contrast timing, patent. Anatomic variants: Detailed above. Review of the MIP images confirms the above findings CT Brain Perfusion Findings: ASPECTS: 10. CBF (<30%) Volume: 0mL Perfusion (Tmax>6.0s) volume: 5mL Mismatch Volume: 5mL, which is likely artifactual given location in the inferior right temporal lobe where there is commonly streak artifact. This also does not correlate with a vascular territory. Infarction Location:None identified IMPRESSION: 1. No large vessel occlusion or proximal high-grade stenosis. 2. The hyperdense vessel seen on CT head correlates with a prominent but patent azygous ACA, anatomic variant. 3. No convincing evidence of core infarct or penumbra. Electronically Signed   By: Gilmore GORMAN Molt M.D.   On: 08/04/2023 20:26   CT HEAD CODE STROKE WO CONTRAST Result Date: 08/04/2023 CLINICAL DATA:  Code stroke.  Neuro deficit, acute, stroke suspected EXAM: CT HEAD WITHOUT CONTRAST TECHNIQUE: Contiguous axial images were obtained from the base of the skull through the vertex without intravenous contrast. RADIATION DOSE REDUCTION: This exam was performed according to the departmental dose-optimization program which includes automated exposure control, adjustment of the mA and/or kV according to patient size and/or use of iterative reconstruction technique. COMPARISON:  None Available. FINDINGS: Brain: No evidence of acute large vascular territory infarction, hemorrhage, hydrocephalus, extra-axial collection or mass lesion/mass effect. Patchy white matter hypodensities are nonspecific but compatible with chronic microvascular ischemic disease. Vascular: Question hyperdense left A2 ACA. Skull: No acute fracture. Sinuses/Orbits: No acute  finding. Remote appearing right medial orbital wall fracture. ASPECTS [facility] Stroke Program Early CT Score) Total score (0-10 with 10 being normal): 10. IMPRESSION: 1. Question hyperdense left A2 ACA, which could be artifactual or represent thrombus. Recommend CTA head/neck. 2. No evidence of acute large vascular territory infarct or acute hemorrhage. ASPECTS is 10. 3. Chronic microvascular ischemic disease. Code stroke imaging results were communicated on 08/04/2023 at 8:04 pm to provider Dr. Franklyn Via telephone, who verbally acknowledged these results. Electronically Signed   By: Gilmore GORMAN Molt M.D.   On: 08/04/2023 20:05    Vitals:   08/05/23 0800 08/05/23 1000 08/05/23 1100 08/05/23 1200  BP: 135/60 (!) 153/76 (!) 132/104   Pulse: 64 77 61   Resp: 13 13    Temp: 98.6 F (37 C)   98.5 F (36.9 C)  TempSrc: Axillary   Axillary  SpO2: 98% 96% 95%   Weight:      Height:         PHYSICAL EXAM General:  Alert, well-nourished, well-developed patient in no acute distress Psych:  Mood and affect appropriate for situation CV: Regular rate and rhythm on monitor Respiratory:  Regular, unlabored respirations on room air GI: Abdomen soft and nontender   NEURO:  Mental Status: AA&Ox3, patient is able to give clear and coherent history Speech/Language: speech is without dysarthria or aphasia.  Naming, repetition, fluency, and comprehension intact.  Cranial Nerves:  II: PRRL.  Right pupil slightly bigger but reactive. Visual fields full.  Patient complains of inconsistent double vision in distance III, IV, VI: EOMI. Eyelids elevate symmetrically.  V: Sensation is intact to light touch and symmetrical to face.  VII: Face is symmetrical resting and smiling  VIII: hearing intact to voice. IX, X: Palate elevates symmetrically. Phonation is normal.  KP:Dynloizm shrug 5/5. XII: tongue is midline  Motor: 5/5 strength to all muscle groups tested.  Tone: is normal and bulk is normal Sensation-  Intact to light touch bilaterally. Extinction absent to light touch to DSS.   Coordination: FTN intact bilaterally, HKS: no ataxia in BLE.No drift.  Gait- deferred  Most Recent NIH: 0    ASSESSMENT/PLAN  Acute Ischemic Infarct:  likely posterior infarct s/p TNK 1/3 @ 2024 Etiology: Small vessel disease and patient with multiple risk factors Code Stroke CT head: No evidence of acute large territory infarct or acute hemorrhage. Aspects is 10 CTA head & neck w/Perfusion: No LVO or high-grade stenosis. No convincing evidence of core infarct or penumbra. MRI: PENDING  2D Echo: PENDING LDL pending HgbA1c pending VTE prophylaxis - SCDs.  Can be placed on Lovenox  or heparin  subcu once post 24-hour TNK imaging shows no hemorrhage. aspirin  81 mg daily prior to admission, now on No antithrombotic pending post 24-hour TNK imaging.  If no hemorrhage, patient will likely be placed on aspirin  and Plavix  for 3 weeks then transition to Plavix  alone Therapy recommendations:  Pending Disposition: pending  Hypertension Home meds: Hydralazine  100 mg, Imdur  30 mg, Benicar , torsemide  20 mg Hydralazine  and Imdur  continued Stable Blood Pressure Goal: BP less than 180/105  Long term BP goal normotensive  Hyperlipidemia Home meds:  none LDL PENDING, goal < 70 If LDL >70, will add statin for secondary stroke protection  Diabetes type II Uncontrolled Home meds: Glipizide  10 mg, Ozempic  HgbA1c pending, goal < 7.0 Hyperglycemia  CBGs SSI, increased to moderate scale d/t continued elevated CBGs Recommend close follow-up with PCP for better DM control  Cocaine Abuse UDS positive for  Cocaine Denies regular use Cessation education provided Pt is willing to quit  Other Stroke Risk Factors Obesity, Body mass index is 36.49 kg/m., BMI >/= 30 associated with increased stroke risk, recommend weight loss, diet and exercise as appropriate  Coronary artery disease CHF Obstructive sleep apnea, not on CPAP  at home   Other Active Problems COPD BPH, continue home Flomax   Hospital day # 1  Pt seen by Neuro NP/APP and later by MD. Note/plan to be edited by MD as needed.    Rocky JAYSON Likes, DNP, AGACNP-BC [facility] Please use AMION for contact information & EPIC for messaging.  ATTENDING NOTE: I reviewed above note and agree with the assessment and plan. Pt was seen and examined.   RN at the bedside. Pt lying in bed, stated that his symptoms largely resolved with only some binocular diplopia long straight. On exam, no significant eye disconjugate, no nystagmus, no ataxia. Neuro essential intact on exam. Pending MRI, concern small brainstem infarct. Pt dose have positive UDS with cocaine. Pt initially denied cocaine use but later admitted for occasional use this New year. OK to work with PT and OT. Pending A1c and LDL but has hyperglycemia overnight, now on SSI. On diet.   For detailed assessment and plan, please refer to above/below as I have made changes wherever appropriate.   Ary Cummins, MD PhD Stroke Neurology 08/05/2023 1:39 PM   This patient is critically ill due to stroke s/p TNK, hyperglycemia and at significant risk of neurological worsening, death form recurrent stroke hemorrhagic conversion, bleeding from TNK, DKA. This patient's care requires constant monitoring of vital signs, hemodynamics, respiratory and cardiac monitoring, review of multiple databases, neurological assessment, discussion with family, other  specialists and medical decision making of high complexity. I spent 35 minutes of neurocritical care time in the care of this patient.    To contact Stroke Continuity provider, please refer to [email or web address]. After hours, contact General Neurology

## 2023-08-05 NOTE — Progress Notes (Signed)
 Echocardiogram 2D Echocardiogram has been performed.  Vincent Ramos 08/05/2023, 2:56 PM

## 2023-08-06 DIAGNOSIS — G459 Transient cerebral ischemic attack, unspecified: Secondary | ICD-10-CM

## 2023-08-06 DIAGNOSIS — Z9282 Status post administration of tPA (rtPA) in a different facility within the last 24 hours prior to admission to current facility: Secondary | ICD-10-CM

## 2023-08-06 LAB — CBC
HCT: 45.4 % (ref 39.0–52.0)
Hemoglobin: 14.6 g/dL (ref 13.0–17.0)
MCH: 26.8 pg (ref 26.0–34.0)
MCHC: 32.2 g/dL (ref 30.0–36.0)
MCV: 83.5 fL (ref 80.0–100.0)
Platelets: 109 10*3/uL — ABNORMAL LOW (ref 150–400)
RBC: 5.44 MIL/uL (ref 4.22–5.81)
RDW: 14.9 % (ref 11.5–15.5)
WBC: 5.9 10*3/uL (ref 4.0–10.5)
nRBC: 0 % (ref 0.0–0.2)

## 2023-08-06 LAB — BASIC METABOLIC PANEL
Anion gap: 10 (ref 5–15)
BUN: 14 mg/dL (ref 8–23)
CO2: 27 mmol/L (ref 22–32)
Calcium: 8.9 mg/dL (ref 8.9–10.3)
Chloride: 97 mmol/L — ABNORMAL LOW (ref 98–111)
Creatinine, Ser: 1.17 mg/dL (ref 0.61–1.24)
GFR, Estimated: >60 mL/min (ref >60)
Glucose, Bld: 295 mg/dL — ABNORMAL HIGH (ref 70–99)
Potassium: 3.6 mmol/L (ref 3.5–5.1)
Sodium: 134 mmol/L — ABNORMAL LOW (ref 135–145)

## 2023-08-06 LAB — LIPID PANEL
Cholesterol: 133 mg/dL (ref 0–200)
HDL: 37 mg/dL — ABNORMAL LOW (ref >40)
LDL Cholesterol: 75 mg/dL (ref 0–99)
Total CHOL/HDL Ratio: 3.6 {ratio}
Triglycerides: 105 mg/dL (ref <150)
VLDL: 21 mg/dL (ref 0–40)

## 2023-08-06 LAB — GLUCOSE, CAPILLARY
Glucose-Capillary: 355 mg/dL — ABNORMAL HIGH (ref 70–99)
Glucose-Capillary: 238 mg/dL — ABNORMAL HIGH (ref 70–99)
Glucose-Capillary: 209 mg/dL — ABNORMAL HIGH (ref 70–99)

## 2023-08-06 MED ORDER — CLOPIDOGREL BISULFATE 75 MG PO TABS
75.0000 mg | ORAL_TABLET | Freq: Every day | ORAL | Status: DC
  Administered 2023-08-06: 75 mg via ORAL
  Filled 2023-08-06: qty 1

## 2023-08-06 MED ORDER — ATORVASTATIN CALCIUM 40 MG PO TABS
40.0000 mg | ORAL_TABLET | Freq: Every day | ORAL | Status: DC
  Administered 2023-08-06: 40 mg via ORAL
  Filled 2023-08-06: qty 1

## 2023-08-06 MED ORDER — ASPIRIN 81 MG PO TBEC
81.0000 mg | DELAYED_RELEASE_TABLET | Freq: Every day | ORAL | Status: DC
  Administered 2023-08-06: 81 mg via ORAL
  Filled 2023-08-06: qty 1

## 2023-08-06 MED ORDER — INSULIN ASPART 100 UNIT/ML IJ SOLN
0.0000 [IU] | Freq: Three times a day (TID) | INTRAMUSCULAR | Status: DC
  Administered 2023-08-06: 7 [IU] via SUBCUTANEOUS

## 2023-08-06 MED ORDER — CLOPIDOGREL BISULFATE 75 MG PO TABS: 75.0000 mg | ORAL_TABLET | Freq: Every day | ORAL | 1 refills | Status: DC

## 2023-08-06 MED ORDER — ATORVASTATIN CALCIUM 40 MG PO TABS: 40.0000 mg | ORAL_TABLET | Freq: Every day | ORAL | 0 refills | Status: AC

## 2023-08-06 MED ORDER — CLOPIDOGREL BISULFATE 75 MG PO TABS: 75.0000 mg | ORAL_TABLET | Freq: Every day | ORAL | 0 refills | Status: DC

## 2023-08-06 MED ORDER — INSULIN ASPART 100 UNIT/ML IJ SOLN: 0.0000 [IU] | Freq: Every day | INTRAMUSCULAR | Status: DC

## 2023-08-06 NOTE — Progress Notes (Signed)
 Patient ordered for DC to home. All discharge instructions reviewed with patient with good understanding verbalized. All belongings gathered. PIVs and cardiac monitor removed. Patient escorted off unit via wheelchair by staff in NAD VSS

## 2023-08-06 NOTE — Plan of Care (Signed)
  Problem: Education: Goal: Knowledge of General Education information will improve Description: Including pain rating scale, medication(s)/side effects and non-pharmacologic comfort measures Outcome: Progressing   Problem: Health Behavior/Discharge Planning: Goal: Ability to manage health-related needs will improve Outcome: Progressing   Problem: Coping: Goal: Level of anxiety will decrease Outcome: Progressing   Problem: Education: Goal: Knowledge of patient specific risk factors will improve Alonso N/A or DELETE if not current risk factor) Outcome: Progressing   Problem: Ischemic Stroke/TIA Tissue Perfusion: Goal: Complications of ischemic stroke/TIA will be minimized Outcome: Progressing   Problem: Coping: Goal: Will verbalize positive feelings about self Outcome: Progressing

## 2023-08-06 NOTE — Evaluation (Signed)
 Occupational Therapy Evaluation/Discharge Patient Details Name: Vincent Ramos MRN: [id number] DOB: July 01, 1951 Today's Date: 08/06/2023   History of Present Illness Pt is a 73 y/o male admitted as a transfer from [facility] with several intermittent problems including double vision, difficulty walking and slurred speech.  Since leaving [facility] symptoms have markedly improved, except for some intermittent residual diplopia.  PMHx:  Chronic dCHF, ARF with hypoxia, Asthma, CAD, HTN, lumbar stenosis DM2 , sleep apnea.   Clinical Impression   Patient evaluated by Occupational Therapy with no further acute OT needs identified. All education has been completed and the patient has no further questions. Pt is functioning at baseline of independent for functional mobility and ADL tasks. No deficits noted from OT standpoint. No follow-up Occupational Therapy or equipment needs. OT is signing off. Thank you for this referral.        If plan is discharge home, recommend the following: Other (comment) (PRN assist)    Functional Status Assessment  Patient has not had a recent decline in their functional status  Equipment Recommendations  None recommended by OT       Precautions / Restrictions Precautions Precautions: None Restrictions Weight Bearing Restrictions Per Provider Order: No      Mobility Bed Mobility Overal bed mobility: Independent     Transfers Overall transfer level: Independent Equipment used: None  General transfer comment: assist provided for line management only      Balance Overall balance assessment: No apparent balance deficits (not formally assessed)      ADL either performed or assessed with clinical judgement   ADL Overall ADL's : Independent         Vision Baseline Vision/History: 0 No visual deficits Ability to See in Adequate Light: 0 Adequate Patient Visual Report: No change from baseline Vision Assessment?: No apparent visual deficits     Perception  Perception: Within Functional Limits       Praxis Praxis: WFL       Pertinent Vitals/Pain Pain Assessment Pain Assessment: No/denies pain     Extremity/Trunk Assessment Upper Extremity Assessment Upper Extremity Assessment: Right hand dominant;Overall WFL for tasks assessed   Lower Extremity Assessment Lower Extremity Assessment: Overall WFL for tasks assessed   Cervical / Trunk Assessment Cervical / Trunk Assessment: Other exceptions Cervical / Trunk Exceptions: body habitus   Communication Communication Communication: No apparent difficulties   Cognition Arousal: Alert Behavior During Therapy: WFL for tasks assessed/performed Overall Cognitive Status: Within Functional Limits for tasks assessed       General Comments  VSS on RA            Home Living Family/patient expects to be discharged to:: Private residence Living Arrangements: Spouse/significant other Available Help at Discharge: Family;Available PRN/intermittently Type of Home: House Home Access: Stairs to enter [employer] of Steps: 3 Entrance Stairs-Rails: Right;Left Home Layout: One level     Bathroom Shower/Tub: Chief Strategy Officer: Standard     Home Equipment: None          Prior Functioning/Environment Prior Level of Function : Independent/Modified Independent;Driving         OT Problem List: Decreased strength         OT Goals(Current goals can be found in the care plan section) Acute Rehab OT Goals OT Goal Formulation: All assessment and education complete, DC therapy  OT Frequency:  1X visit       AM-PAC OT 6 Clicks Daily Activity     Outcome Measure Help from another  person eating meals?: None Help from another person taking care of personal grooming?: None Help from another person toileting, which includes using toliet, bedpan, or urinal?: None Help from another person bathing (including washing, rinsing, drying)?: None Help from another  person to put on and taking off regular upper body clothing?: None Help from another person to put on and taking off regular lower body clothing?: None 6 Click Score: 24   End of Session Nurse Communication: Mobility status  Activity Tolerance: Patient tolerated treatment well Patient left: in chair;with call bell/phone within reach  OT Visit Diagnosis: Muscle weakness (generalized) (M62.81)                Time: 9061-8998 OT Time Calculation (min): 23 min Charges:  OT General Charges $OT Visit: 1 Visit OT Evaluation $OT Eval Low Complexity: 1 Low  Vincent Ramos, OTR/L,CBIS  Supplemental OT - [facility] and [facility] [employer] Secure Chat Preferred    Vincent Ramos, Vincent Ramos 08/06/2023, 1:42 PM

## 2023-08-06 NOTE — Discharge Summary (Addendum)
 Stroke Discharge Summary  Patient ID: Vincent Ramos    l   MRN: [id number]      DOB: 04/02/1951  Date of Admission: 08/04/2023 Date of Discharge: 08/06/2023  Attending Physician:  Stroke, Md, MD Patient's PCP:  Vincent Prentice DELENA Mickey., FNP  DISCHARGE DIAGNOSIS:  Stroke symptoms s/p TNK TIA vs. MRI negative posterior infarct  Secondary Problem: HTN HLD DM, uncontrolled Cocaine abuse Obesity  CAD CHF OSA  COPD BPH    Allergies as of 08/06/2023       Reactions   Amlodipine     Felt jittery constantly   Novolog  [insulin  Aspart (human Analog)] Hives, Itching, Rash   Victoza [liraglutide] Itching, Rash        Medication List     TAKE these medications    albuterol  108 (90 Base) MCG/ACT inhaler Commonly known as: VENTOLIN  HFA Inhale 2 puffs into the lungs every 6 (six) hours as needed for wheezing or shortness of breath.   aspirin  EC 81 MG tablet Take 1 tablet (81 mg total) by mouth daily with breakfast.   atorvastatin  40 MG tablet Commonly known as: LIPITOR Take 1 tablet (40 mg total) by mouth daily. Start taking on: August 07, 2023   clopidogrel  75 MG tablet Commonly known as: PLAVIX  Take 1 tablet (75 mg total) by mouth daily. Start taking on: August 07, 2023   gabapentin  300 MG capsule Commonly known as: NEURONTIN  Take 300-600 mg by mouth daily.   glipiZIDE  10 MG tablet Commonly known as: GLUCOTROL  Take 10 mg by mouth 2 (two) times daily before a meal.   hydrALAZINE  100 MG tablet Commonly known as: APRESOLINE  Take 100 mg by mouth daily.   isosorbide  mononitrate 30 MG 24 hr tablet Commonly known as: IMDUR  Take 1 tablet (30 mg total) by mouth daily. Can take an additional tab as needed   nitroGLYCERIN  0.4 MG SL tablet Commonly known as: NITROSTAT  Place 1 tablet (0.4 mg total) under the tongue every 5 (five) minutes as needed.   olmesartan -hydrochlorothiazide  40-25 MG tablet Commonly known as: BENICAR  HCT Take 1 tablet by mouth daily.   Ozempic  (0.25 or 0.5 MG/DOSE) 2 MG/3ML Sopn Generic drug: Semaglutide(0.25 or 0.5MG /DOS) Inject 0.25 mg into the skin every Saturday. For 4 weeks.Then increase to 0.5 mg weekly.   Potassium Chloride  ER 20 MEQ Tbcr Take 1 tablet by mouth daily.   tamsulosin  0.4 MG Caps capsule Commonly known as: FLOMAX  Take 0.4 mg by mouth daily.   torsemide  20 MG tablet Commonly known as: DEMADEX  TAKE 1 TABLET BY MOUTH EVERY DAY. MAY TAKE AN ADDITIONAL TABLET AS NEEDED What changed: See the new instructions.   VITAMIN D-3 PO Take 1 capsule by mouth every other day.   VITAMIN E PO Take 1 capsule by mouth every other day.        LABORATORY STUDIES CBC    Component Value Date/Time   WBC 5.9 08/06/2023 0434   RBC 5.44 08/06/2023 0434   HGB 14.6 08/06/2023 0434   HCT 45.4 08/06/2023 0434   PLT 109 (L) 08/06/2023 0434   MCV 83.5 08/06/2023 0434   MCH 26.8 08/06/2023 0434   MCHC 32.2 08/06/2023 0434   RDW 14.9 08/06/2023 0434   LYMPHSABS 1.0 08/04/2023 1947   MONOABS 0.6 08/04/2023 1947   EOSABS 0.2 08/04/2023 1947   BASOSABS 0.0 08/04/2023 1947   CMP    Component Value Date/Time   NA 134 (L) 08/06/2023 0434   NA 140 03/10/2023 1219   K 3.6  08/06/2023 0434   CL 97 (L) 08/06/2023 0434   CO2 27 08/06/2023 0434   GLUCOSE 295 (H) 08/06/2023 0434   BUN 14 08/06/2023 0434   BUN 15 03/10/2023 1219   CREATININE 1.17 08/06/2023 0434   CREATININE 1.09 01/04/2013 0804   CALCIUM  8.9 08/06/2023 0434   PROT 6.8 08/04/2023 1947   ALBUMIN  3.8 08/04/2023 1947   AST 22 08/04/2023 1947   ALT 24 08/04/2023 1947   ALKPHOS 68 08/04/2023 1947   BILITOT 1.3 (H) 08/04/2023 1947   GFRNONAA >60 08/06/2023 0434   GFRAA 69 02/14/2020 1037   COAGS Lab Results  Component Value Date   INR 0.9 08/04/2023   INR 0.9 09/19/2022   Lipid Panel    Component Value Date/Time   CHOL 133 08/06/2023 0434   TRIG 105 08/06/2023 0434   HDL 37 (L) 08/06/2023 0434   CHOLHDL 3.6 08/06/2023 0434   VLDL 21 08/06/2023  0434   LDLCALC 75 08/06/2023 0434   HgbA1C  Lab Results  Component Value Date   HGBA1C 7.3 (H) 09/19/2022   Urinalysis    Component Value Date/Time   COLORURINE YELLOW 08/04/2023 1943   APPEARANCEUR CLEAR 08/04/2023 1943   LABSPEC 1.027 08/04/2023 1943   PHURINE 6.0 08/04/2023 1943   GLUCOSEU NEGATIVE 08/04/2023 1943   HGBUR NEGATIVE 08/04/2023 1943   BILIRUBINUR NEGATIVE 08/04/2023 1943   KETONESUR NEGATIVE 08/04/2023 1943   PROTEINUR NEGATIVE 08/04/2023 1943   NITRITE NEGATIVE 08/04/2023 1943   LEUKOCYTESUR NEGATIVE 08/04/2023 1943   Urine Drug Screen     Component Value Date/Time   LABOPIA NONE DETECTED 08/04/2023 1943   COCAINSCRNUR POSITIVE (A) 08/04/2023 1943   LABBENZ NONE DETECTED 08/04/2023 1943   AMPHETMU NONE DETECTED 08/04/2023 1943   THCU NONE DETECTED 08/04/2023 1943   LABBARB NONE DETECTED 08/04/2023 1943    Alcohol Level    Component Value Date/Time   ETH <10 08/04/2023 1947     SIGNIFICANT DIAGNOSTIC STUDIES MR BRAIN WO CONTRAST Result Date: 08/05/2023 CLINICAL DATA:  Stroke, follow up 24 hour post-TNK scan (APPROX.8:30 PM on 1/4). EXAM: MRI HEAD WITHOUT CONTRAST TECHNIQUE: Multiplanar, multiecho pulse sequences of the brain and surrounding structures were obtained without intravenous contrast. COMPARISON:  CT head 08/04/2023 FINDINGS: Brain: No acute infarction, hemorrhage, hydrocephalus, extra-axial collection or mass lesion. Mild to moderate T2/FLAIR hyperintensities the white matter, nonspecific but compatible with chronic microvascular ischemic disease. Vascular: Major arterial flow voids are maintained at the skull base. Skull and upper cervical spine: Normal marrow signal. Sinuses/Orbits: Right maxillary sinus retention cyst. Otherwise, clear sinuses. Other: No mastoid effusions. IMPRESSION: No evidence of acute intracranial abnormality. Electronically Signed   By: Gilmore GORMAN Molt M.D.   On: 08/05/2023 21:15   ECHOCARDIOGRAM COMPLETE Result Date:  08/05/2023    ECHOCARDIOGRAM REPORT   Patient Name:   Vincent Ramos Date of Exam: 08/05/2023 Medical Rec #:  [id number]        Height:       66.5 in Accession #:    [id number]       Weight:       229.5 lb Date of Birth:  1950-12-06         BSA:          2.132 m Patient Age:    72 years         BP:           111/96 mmHg Patient Gender: M  HR:           61 bpm. Exam Location:  Inpatient Procedure: 2D Echo, Cardiac Doppler and Color Doppler Indications:    Stroke I63.9  History:        Patient has prior history of Echocardiogram examinations, most                 recent 09/20/2022. CHF, CAD, Stroke and COPD,                 Signs/Symptoms:Shortness of Breath; Risk Factors:Hypertension,                 Diabetes and Sleep Apnea.  Sonographer:    Thea Norlander RCS Referring Phys: LOLA LITTIE JERNIGAN IMPRESSIONS  1. Left ventricular ejection fraction, by estimation, is 55 to 60%. The left ventricle has normal function. The left ventricle has no regional wall motion abnormalities. There is severe left ventricular hypertrophy. Left ventricular diastolic parameters  were normal.  2. Right ventricular systolic function is normal. The right ventricular size is normal.  3. The mitral valve is normal in structure. No evidence of mitral valve regurgitation. No evidence of mitral stenosis.  4. The aortic valve is tricuspid. Aortic valve regurgitation is not visualized. Aortic valve sclerosis is present, with no evidence of aortic valve stenosis.  5. Aortic dilatation noted. There is borderline dilatation of the aortic root, measuring 38 mm. Comparison(s): A prior study was performed on 09/20/2022. LVEF was 70-75% and now 55-60%, Grade 2 diastolic dysfunction is now Grade1, otherwise see report. Conclusion(s)/Recommendation(s): No intracardiac source of embolism detected on this transthoracic study. Consider a transesophageal echocardiogram to exclude cardiac source of embolism if clinically indicated. FINDINGS  Left  Ventricle: Left ventricular ejection fraction, by estimation, is 55 to 60%. The left ventricle has normal function. The left ventricle has no regional wall motion abnormalities. The left ventricular internal cavity size was normal in size. There is  severe left ventricular hypertrophy. Left ventricular diastolic parameters were normal. Right Ventricle: The right ventricular size is normal. No increase in right ventricular wall thickness. Right ventricular systolic function is normal. Left Atrium: Left atrial size was normal in size. Right Atrium: Right atrial size was normal in size. Pericardium: Trivial pericardial effusion is present. Mitral Valve: The mitral valve is normal in structure. No evidence of mitral valve regurgitation. No evidence of mitral valve stenosis. Tricuspid Valve: The tricuspid valve is normal in structure. Tricuspid valve regurgitation is not demonstrated. No evidence of tricuspid stenosis. Aortic Valve: The aortic valve is tricuspid. Aortic valve regurgitation is not visualized. Aortic valve sclerosis is present, with no evidence of aortic valve stenosis. Aortic valve peak gradient measures 5.4 mmHg. Pulmonic Valve: The pulmonic valve was not well visualized. Pulmonic valve regurgitation is not visualized. No evidence of pulmonic stenosis. Aorta: Aortic dilatation noted. There is borderline dilatation of the aortic root, measuring 38 mm. IAS/Shunts: The interatrial septum was not well visualized.  LEFT VENTRICLE PLAX 2D LVIDd:         5.00 cm   Diastology LVIDs:         3.50 cm   LV e' medial:    7.62 cm/s LV PW:         1.40 cm   LV E/e' medial:  10.9 LV IVS:        1.50 cm   LV e' lateral:   8.05 cm/s LVOT diam:     2.20 cm   LV E/e' lateral: 10.3 LV SV:  75 LV SV Index:   35 LVOT Area:     3.80 cm  RIGHT VENTRICLE             IVC RV S prime:     11.20 cm/s  IVC diam: 1.35 cm TAPSE (M-mode): 2.5 cm LEFT ATRIUM             Index        RIGHT ATRIUM           Index LA diam:         3.30 cm 1.55 cm/m   RA Area:     15.70 cm LA Vol (A2C):   42.5 ml 19.93 ml/m  RA Volume:   37.70 ml  17.68 ml/m LA Vol (A4C):   58.3 ml 27.34 ml/m LA Biplane Vol: 53.3 ml 25.00 ml/m  AORTIC VALVE AV Area (Vmax): 2.90 cm AV Vmax:        116.00 cm/s AV Peak Grad:   5.4 mmHg LVOT Vmax:      88.60 cm/s LVOT Vmean:     57.600 cm/s LVOT VTI:       0.198 m  AORTA Ao Root diam: 3.80 cm Ao Asc diam:  3.70 cm MITRAL VALVE MV Area (PHT): 3.85 cm    SHUNTS MV Decel Time: 197 msec    Systemic VTI:  0.20 m MV E velocity: 83.30 cm/s  Systemic Diam: 2.20 cm MV A velocity: 70.20 cm/s MV E/A ratio:  1.19 Sunit Tolia Electronically signed by Madonna Large Signature Date/Time: 08/05/2023/4:02:13 PM    Final    CT ANGIO HEAD NECK W WO CM W PERF (CODE STROKE) Result Date: 08/04/2023 CLINICAL DATA:  Stroke/TIA, determine embolic source double vision, vertigo EXAM: CT ANGIOGRAPHY HEAD AND NECK CT PERFUSION BRAIN TECHNIQUE: Multidetector CT imaging of the head and neck was performed using the standard protocol during bolus administration of intravenous contrast. Multiplanar CT image reconstructions and MIPs were obtained to evaluate the vascular anatomy. Carotid stenosis measurements (when applicable) are obtained utilizing NASCET criteria, using the distal internal carotid diameter as the denominator. Multiphase CT imaging of the brain was performed following IV bolus contrast injection. Subsequent parametric perfusion maps were calculated using RAPID software. RADIATION DOSE REDUCTION: This exam was performed according to the departmental dose-optimization program which includes automated exposure control, adjustment of the mA and/or kV according to patient size and/or use of iterative reconstruction technique. CONTRAST:  OMNIPAQUE  IOHEXOL  350 MG/ML SOLN COMPARISON:  CT head from the study. FINDINGS: CTA NECK FINDINGS Aortic arch: Great vessel origins are patent without significant stenosis. Aortic atherosclerosis. Right  carotid system: Atherosclerosis without significant (greater than 50%) stenosis. Tortuous ICA. Left carotid system: Atherosclerosis without significant (greater than 50%) stenosis. Vertebral arteries: Codominant. No evidence of dissection, stenosis (50% or greater) or occlusion. Skeleton: Multilevel ACDF.  No acute abnormality limited assessment. Other neck: No acute abnormality on limited assessment. Upper chest: Visualized lung apices are clear. Review of the MIP images confirms the above findings CTA HEAD FINDINGS Anterior circulation: Bilateral intracranial ICAs, MCAs and ACAs are patent without proximal hemodynamically significant stenosis. Prominent azygous ACA, anatomic variant. Posterior circulation: Bilateral intradural vertebral arteries, basilar artery and bilateral fissural arteries are patent without proximal hemodynamically significant stenosis. Venous sinuses: As permitted by contrast timing, patent. Anatomic variants: Detailed above. Review of the MIP images confirms the above findings CT Brain Perfusion Findings: ASPECTS: 10. CBF (<30%) Volume: 0mL Perfusion (Tmax>6.0s) volume: 5mL Mismatch Volume: 5mL, which is likely artifactual given location in  the inferior right temporal lobe where there is commonly streak artifact. This also does not correlate with a vascular territory. Infarction Location:None identified IMPRESSION: 1. No large vessel occlusion or proximal high-grade stenosis. 2. The hyperdense vessel seen on CT head correlates with a prominent but patent azygous ACA, anatomic variant. 3. No convincing evidence of core infarct or penumbra. Electronically Signed   By: Gilmore GORMAN Molt M.D.   On: 08/04/2023 20:26   CT HEAD CODE STROKE WO CONTRAST Result Date: 08/04/2023 CLINICAL DATA:  Code stroke.  Neuro deficit, acute, stroke suspected EXAM: CT HEAD WITHOUT CONTRAST TECHNIQUE: Contiguous axial images were obtained from the base of the skull through the vertex without intravenous contrast.  RADIATION DOSE REDUCTION: This exam was performed according to the departmental dose-optimization program which includes automated exposure control, adjustment of the mA and/or kV according to patient size and/or use of iterative reconstruction technique. COMPARISON:  None Available. FINDINGS: Brain: No evidence of acute large vascular territory infarction, hemorrhage, hydrocephalus, extra-axial collection or mass lesion/mass effect. Patchy white matter hypodensities are nonspecific but compatible with chronic microvascular ischemic disease. Vascular: Question hyperdense left A2 ACA. Skull: No acute fracture. Sinuses/Orbits: No acute finding. Remote appearing right medial orbital wall fracture. ASPECTS [facility] Stroke Program Early CT Score) Total score (0-10 with 10 being normal): 10. IMPRESSION: 1. Question hyperdense left A2 ACA, which could be artifactual or represent thrombus. Recommend CTA head/neck. 2. No evidence of acute large vascular territory infarct or acute hemorrhage. ASPECTS is 10. 3. Chronic microvascular ischemic disease. Code stroke imaging results were communicated on 08/04/2023 at 8:04 pm to provider Dr. Franklyn Via telephone, who verbally acknowledged these results. Electronically Signed   By: Gilmore GORMAN Molt M.D.   On: 08/04/2023 20:05      HISTORY OF PRESENT ILLNESS 73 y.o. male with past medical history significant for acute on chronic diastolic CHF, CAD, sleep apnea, type 2 diabetes, COPD with exacerbations, enlarged prostate, hypertension, obesity, small bowel obstruction presenting to [facility] 1/3 due to acute onset of double vision, difficulty walking, slurred speech.  He was treated with TNK at [facility] and transferred to [facility] for post TNK care and stroke workup.    HOSPITAL COURSE Acute Ischemic Infarct:  Posterior infarct, not seen on MRI s/p TNK 1/3 @ 2024 Etiology: Small vessel disease and patient with multiple risk factors Code Stroke CT head: No evidence  of acute large territory infarct or acute hemorrhage. Aspects is 10 CTA head & neck w/Perfusion: No LVO or high-grade stenosis. No convincing evidence of core infarct or penumbra. MRI: Negative for stroke 2D Echo: LVEF 55 to 60%, severe LVH LDL 75 HgbA1c: pending VTE prophylaxis - SCDs aspirin  81 mg daily prior to admission, continue aspirin  and Plavix  for 3 weeks then transition to Plavix  alone Therapy recommendations:  No follow-up needed Disposition: home 1/5  Hypertension Home meds: Hydralazine  100 mg, Imdur  30 mg, Benicar , torsemide  20 mg Hydralazine  and Imdur  continued Stable Long term BP goal normotensive   Hyperlipidemia Home meds:  none LDL 75, goal < 70 On lipitor 40 Continue statin on discharge   Diabetes type II Uncontrolled Home meds: Glipizide  10 mg, Ozempic  HgbA1c lab pending, will follow-up with PCP, goal < 7.0 Hyperglycemia  CBGs, SSI Recommend close follow-up with PCP for better DM control, discussed with patient  Cocaine Abuse UDS positive for  Cocaine Denies regular use Cessation education provided Pt is willing to quit   Other Stroke Risk Factors Obesity, Body mass index  is 36.49 kg/m., BMI >/= 30 associated with increased stroke risk, recommend weight loss, diet and exercise as appropriate  Coronary artery disease CHF Obstructive sleep apnea, not on CPAP at home   Other Active Problems COPD BPH, continue home Flomax   DISCHARGE EXAM Blood pressure (!) 154/88, pulse 68, temperature 98 F (36.7 C), temperature source Oral, resp. rate 20, height 5' 6.5 (1.689 m), weight 104.1 kg, SpO2 94%. PHYSICAL EXAM General:  Alert, well-nourished, well-developed patient in no acute distress Psych:  Mood and affect appropriate for situation CV: Regular rate and rhythm on monitor Respiratory:  Regular, unlabored respirations on room air GI: Abdomen soft and nontender     NEURO:  Mental Status: AA&Ox3, patient is able to give clear and coherent  history Speech/Language: speech is without dysarthria or aphasia.  Naming, repetition, fluency, and comprehension intact.   Cranial Nerves:  II: PRRL. Visual fields full.  Denies diplopia seen yesterday III, IV, VI: EOMI. Eyelids elevate symmetrically.  V: Sensation is intact to light touch and symmetrical to face.  VII: Face is symmetrical resting and smiling VIII: hearing intact to voice. IX, X: Palate elevates symmetrically. Phonation is normal.  KP:Dynloizm shrug 5/5. XII: tongue is midline  Motor: 5/5 strength to all muscle groups tested.  Tone: is normal and bulk is normal Sensation- Intact to light touch bilaterally. Extinction absent to light touch to DSS.   Coordination: FTN intact bilaterally, HKS: no ataxia in BLE.No drift.  Gait- deferred  NIH on discharge: 0  Discharge Diet       Diet   Diet heart healthy/carb modified Room service appropriate? Yes with Assist; Fluid consistency: Thin   liquids  DISCHARGE PLAN Disposition:  home aspirin  81 mg daily and clopidogrel  75 mg daily for secondary stroke prevention for 3 weeks then plavix  alone. Ongoing stroke risk factor control by Primary Care Physician at time of discharge Follow-up PCP Vincent Prentice DELENA Mickey., FNP in 2 weeks. Follow-up in [facility] in 4-6 weeks, office to schedule an appointment.   35 minutes were spent preparing discharge.  Pt seen by Neuro NP/APP and later by MD. Note/plan to be edited by MD as needed.    Rocky JAYSON Likes, DNP, AGACNP-BC [facility] Please use AMION for contact information & EPIC for messaging.  ATTENDING NOTE: I reviewed above note and agree with the assessment and plan. Pt was seen and examined.   RN and OT are at the bedside. Pt reclining in bed, stated that his symptoms all gone. Glucose still on the high end, on SSI. He was on insulin  before but not on PTA, he will need close follow up with PCP after discharge. PT and OT no recs. He is  on DAPT for 3 weeks and then Plavix  alone, he will need statin too. Follow up at [facility] on 4-6 weeks. Quit cocaine. Pt will be discharged in good condition today.   For detailed assessment and plan, please refer to above/below as I have made changes wherever appropriate.   Ary Cummins, MD PhD Stroke Neurology 08/06/2023 3:43 PM

## 2023-08-07 LAB — HEMOGLOBIN A1C
Hgb A1c MFr Bld: 8.9 % — ABNORMAL HIGH (ref 4.8–5.6)
Hgb A1c MFr Bld: 8.7 % — ABNORMAL HIGH (ref 4.8–5.6)
Mean Plasma Glucose: 209 mg/dL
Mean Plasma Glucose: 203 mg/dL

## 2023-09-09 LAB — COLOGUARD: COLOGUARD: NEGATIVE

## 2023-09-20 ENCOUNTER — Other Ambulatory Visit: Payer: Self-pay | Admitting: Nurse Practitioner

## 2023-10-05 ENCOUNTER — Encounter: Payer: Self-pay | Admitting: Neurology

## 2023-10-05 ENCOUNTER — Ambulatory Visit: Payer: Medicare Other | Admitting: Neurology

## 2023-10-05 VITALS — BP 178/88 | HR 84 | Ht 66.5 in | Wt 266.0 lb

## 2023-10-05 DIAGNOSIS — Z7984 Long term (current) use of oral hypoglycemic drugs: Secondary | ICD-10-CM

## 2023-10-05 DIAGNOSIS — I639 Cerebral infarction, unspecified: Secondary | ICD-10-CM

## 2023-10-05 DIAGNOSIS — E1142 Type 2 diabetes mellitus with diabetic polyneuropathy: Secondary | ICD-10-CM | POA: Diagnosis not present

## 2023-10-05 MED ORDER — CLOPIDOGREL BISULFATE 75 MG PO TABS: 75.0000 mg | ORAL_TABLET | Freq: Every day | ORAL | 3 refills | Status: AC

## 2023-10-05 NOTE — Progress Notes (Signed)
 Chief Complaint  Patient presents with   New Patient (Initial Visit)    Pt in 15, here alone  Pt is here for ER follow up after stroke. Pt states he has been doing well. Pt states he still has a headache every now and then.       ASSESSMENT AND PLAN  Vincent Ramos is a 73 y.o. male   TIA, s/p iv TNK in Jan 2025  Multiple vascular risk factor, obesity, hypertension, diabetes, hyperlipidemia, history of coronary artery disease,  Keep Plavix 75 mg daily Diabetic peripheral neuropathy  A1c was 8.7, emphasized importance of better control of diabetes, diet control, moderate exercise, At risk for obstructive sleep apnea  He would benefit sleep study, he would like referral from his primary care  Continue follow-up with primary care return to clinic for new issues   DIAGNOSTIC DATA (LABS, IMAGING, TESTING) - I reviewed patient records, labs, notes, testing and imaging myself where available.   MEDICAL HISTORY:  Vincent Ramos is a 73 years old male, seen in request by his primary care from [facility] NP Fatima Sanger for evaluation of TIA, initial evaluation October 05, 2023  History is obtained from the patient and review of electronic medical records. I personally reviewed pertinent available imaging films in PACS.   PMHx of  DM HTN HLD CAD Obesity Cervical decompression, right cervical radiculopathy Lumbar compression   He presented to the [facility] emergency room on August 04, 2023 slurred speech, had acute onset double vision, difficulty walking, received IV TNK, then transferred to [facility] MRI of the brain showed no acute abnormality  CT angiogram head and neck no large vessel disease  Echocardiogram ejection fraction 55 to 60% severe left ventricular hypertrophy  A1c was 8.7, LDL 73  He was on aspirin 81 mg prior to admission, discharged with aspirin plus Plavix 75 mg for 3 weeks then Plavix alone  His symptoms only last about couple days, now back  to baseline, to have significant obesity, narrow on pharyngeal space at high risk for obstructive sleep apnea, he is referred to sleep specialist by primary care per patient,  PHYSICAL EXAM:   Vitals:   10/05/23 1108 10/05/23 1122  BP: (!) 188/99 (!) 178/88  Pulse: 84   Weight: 266 lb (120.7 kg)   Height: 5' 6.5" (1.689 m)    Not recorded     Body mass index is 42.29 kg/m.  PHYSICAL EXAMNIATION:  Gen: NAD, conversant, well nourised, well groomed                     Cardiovascular: Regular rate rhythm, no peripheral edema, warm, nontender. Eyes: Conjunctivae clear without exudates or hemorrhage Neck: Supple, no carotid bruits. Pulmonary: Clear to auscultation bilaterally   NEUROLOGICAL EXAM:  MENTAL STATUS: Speech/cognition: Central obesity awake, alert, oriented to history taking and casual conversation CRANIAL NERVES: CN II: Visual fields are full to confrontation. Pupils are round equal and briskly reactive to light. CN III, IV, VI: extraocular movement are normal. No ptosis. CN V: Facial sensation is intact to light touch CN VII: Face is symmetric with normal eye closure  CN VIII: Hearing is normal to causal conversation. CN IX, X: Phonation is normal. CN XI: Head turning and shoulder shrug are intact CN XII: Narrow on pharyngeal space  MOTOR: There is no pronator drift of out-stretched arms. Muscle bulk and tone are normal. Muscle strength is normal.  REFLEXES: Reflexes are 1 and symmetric  at the biceps, triceps, knees, and absent ankles. Plantar responses are flexor.  SENSORY: Mild length-dependent sensory changes  COORDINATION: There is no trunk or limb dysmetria noted.  GAIT/STANCE: Push-up to get up from seated position, limited by his big body habitus  REVIEW OF SYSTEMS:  Full 14 system review of systems performed and notable only for as above All other review of systems were negative.   ALLERGIES: Allergies  Allergen Reactions   Amlodipine      Felt "jittery" constantly   Novolog [Insulin Aspart (Human Analog)] Hives, Itching and Rash   Victoza [Liraglutide] Itching and Rash    HOME MEDICATIONS: Current Outpatient Medications  Medication Sig Dispense Refill   albuterol (VENTOLIN HFA) 108 (90 Base) MCG/ACT inhaler Inhale 2 puffs into the lungs every 6 (six) hours as needed for wheezing or shortness of breath.     aspirin EC 81 MG tablet Take 1 tablet (81 mg total) by mouth daily with breakfast. 30 tablet 4   atorvastatin (LIPITOR) 40 MG tablet Take 1 tablet (40 mg total) by mouth daily. 30 tablet 0   Cholecalciferol (VITAMIN D-3 PO) Take 1 capsule by mouth every other day.     clopidogrel (PLAVIX) 75 MG tablet Take 1 tablet (75 mg total) by mouth daily. 30 tablet 1   gabapentin (NEURONTIN) 300 MG capsule Take 300-600 mg by mouth daily.     glipiZIDE (GLUCOTROL) 10 MG tablet Take 10 mg by mouth 2 (two) times daily before a meal.     hydrALAZINE (APRESOLINE) 100 MG tablet Take 100 mg by mouth daily.     isosorbide mononitrate (IMDUR) 30 MG 24 hr tablet Take 1 tablet (30 mg total) by mouth daily. Can take an additional tab as needed     olmesartan-hydrochlorothiazide (BENICAR HCT) 40-25 MG tablet Take 1 tablet by mouth daily.     OZEMPIC, 0.25 OR 0.5 MG/DOSE, 2 MG/3ML SOPN Inject 0.25 mg into the skin every Saturday. For 4 weeks.Then increase to 0.5 mg weekly.     Potassium Chloride ER 20 MEQ TBCR Take 1 tablet by mouth daily.     tamsulosin (FLOMAX) 0.4 MG CAPS capsule Take 0.4 mg by mouth daily.     torsemide (DEMADEX) 20 MG tablet TAKE 1 TABLET BY MOUTH EVERY DAY. MAY TAKE AN ADDITIONAL TABLET AS NEEDED 135 tablet 2   TRELEGY ELLIPTA 200-62.5-25 MCG/ACT AEPB Take 1 puff by mouth daily.     TRESIBA FLEXTOUCH 100 UNIT/ML FlexTouch Pen INJECT 50 UNITS UNDER THE SKIN EVERY NIGHT AT BEDTIME AND 10 UNITS EVERY MORNING     VITAMIN E PO Take 1 capsule by mouth every other day.     nitroGLYCERIN (NITROSTAT) 0.4 MG SL tablet Place 1 tablet  (0.4 mg total) under the tongue every 5 (five) minutes as needed. 25 tablet 3   No current facility-administered medications for this visit.    PAST MEDICAL HISTORY: Past Medical History:  Diagnosis Date   Abnormal nuclear cardiac imaging test 12/27/2017   Acute on chronic diastolic CHF (congestive heart failure) (HCC) 09/17/2017   Acute respiratory failure with hypoxia (HCC) 06/01/2017   Arthritis    Asthma    CAD (coronary artery disease)    a. 11/2017: cath showing widely patent coronary arteries with normal left main, 30% mid LAD, luminal irregularities in the proximal and mid circumflex, and 30 to 40% stenosis in the mid RCA.   CAP (community acquired pneumonia) 07/31/2014   Community acquired pneumonia 07/31/2014   Constipation 02/06/2018  COPD exacerbation (HCC) 09/17/2017   Dysphagia    Elevated troponin 09/17/2017   Enlarged prostate    Grade II diastolic dysfunction    Hypertension    Lumbar stenosis 10/07/2015   Obesity 07/31/2014   PNA (pneumonia) 05/30/2017   Pneumonia    Pneumonia    Polyp of rectum    Respiratory failure (HCC) 09/18/2017   SBO (small bowel obstruction) (HCC) 05/08/2019   Shortness of breath    Sleep apnea    Sleep Study 08/21/17: Severe OSA   Type 2 diabetes mellitus (HCC)     PAST SURGICAL HISTORY: Past Surgical History:  Procedure Laterality Date   ANTERIOR CERVICAL DECOMP/DISCECTOMY FUSION N/A 09/17/2020   Procedure: Anterior Cevrvical Decompresion / Discectomy Fusion Cervical four-five,Cervical five-six,Cervical six- seven;  Surgeon: Bedelia Person, MD;  Location: [facility] OR;  Service: Neurosurgery;  Laterality: N/A;   CIRCUMCISION     COLONOSCOPY WITH PROPOFOL N/A 08/08/2017   Procedure: COLONOSCOPY WITH PROPOFOL;  Surgeon: West Bali, MD;  Location: [facility] ENDO SUITE;  Service: Endoscopy;  Laterality: N/A;  1:00pm   ESOPHAGEAL DILATION N/A 06/07/2017   Procedure: ESOPHAGEAL DILATION;  Surgeon: Corbin Ade, MD;  Location: [facility] ENDO SUITE;   Service: Endoscopy;  Laterality: N/A;   ESOPHAGOGASTRODUODENOSCOPY (EGD) WITH PROPOFOL N/A 06/07/2017   Procedure: ESOPHAGOGASTRODUODENOSCOPY (EGD) WITH PROPOFOL;  Surgeon: Corbin Ade, MD;  Location: [facility] ENDO SUITE;  Service: Endoscopy;  Laterality: N/A;   HYDROCELE EXCISION  10/14/2011   Procedure: HYDROCELECTOMY ADULT;  Surgeon: Anner Crete, MD;  Location: [facility] ORS;  Service: Urology;  Laterality: Right;   LACERATION REPAIR     left hand   LUMBAR LAMINECTOMY/DECOMPRESSION MICRODISCECTOMY Right 10/07/2015   Procedure: Right Lumbar four-five ,lumbar five sacral-one Laminectomy;  Surgeon: Hilda Lias, MD;  Location: [facility] NEURO ORS;  Service: Neurosurgery;  Laterality: Right;   RIGHT/LEFT HEART CATH AND CORONARY ANGIOGRAPHY N/A 12/27/2017   Procedure: RIGHT/LEFT HEART CATH AND CORONARY ANGIOGRAPHY;  Surgeon: Lyn Records, MD;  Location: [facility] INVASIVE CV LAB;  Service: Cardiovascular;  Laterality: N/A;    FAMILY HISTORY: Family History  Problem Relation Age of Onset   Diabetes Other    Hypertension Other    CAD Other    Cancer Other    Lung cancer Mother        smoker   Diabetes Sister    Hypertension Sister    Anesthesia problems Neg Hx    Hypotension Neg Hx    Malignant hyperthermia Neg Hx    Pseudochol deficiency Neg Hx    Colon cancer Neg Hx    Colon polyps Neg Hx     SOCIAL HISTORY: Social History   Socioeconomic History   Marital status: Married    Spouse name: Not on file   Number of children: Not on file   Years of education: Not on file   Highest education level: Not on file  Occupational History   Not on file  Tobacco Use   Smoking status: Never   Smokeless tobacco: Never  Vaping Use   Vaping status: Never Used  Substance and Sexual Activity   Alcohol use: Yes    Alcohol/week: 1.0 standard drink of alcohol    Types: 1 Cans of beer per week    Comment: beer once a month   Drug use: No   Sexual activity: Yes    Birth control/protection: None  Other Topics  Concern   Not on file  Social History Narrative   Not on file  Social Drivers of Corporate investment banker Strain: Not on file  Food Insecurity: No Food Insecurity (09/19/2022)   Hunger Vital Sign    Worried About Running Out of Food in the Last Year: Never true    Ran Out of Food in the Last Year: Never true  Transportation Needs: Not on file  Physical Activity: Not on file  Stress: Not on file  Social Connections: Not on file  Intimate Partner Violence: Not on file      Levert Feinstein, M.D. Ph.D.  [facility] [address], [address] [place], [place] [address] Ph: [phone] Fax: [phone]  CC:  Lynnae January, NP [address], [address] [place],  [place] [address]  Raymon Mutton., FNP

## 2023-11-14 ENCOUNTER — Telehealth: Payer: Self-pay

## 2023-11-14 NOTE — Patient Outreach (Signed)
 First telephone outreach attempt to obtain mRS. No answer. Left message for returned call.  [facility]  Population Health Care Management Assistant  Direct Dial: [phone]  Fax: [phone] Website: [email or web address]

## 2023-11-15 ENCOUNTER — Telehealth: Payer: Self-pay

## 2023-11-15 NOTE — Patient Outreach (Signed)
 Telephone outreach to patient to obtain mRS was successfully completed. MRS= 0  Pt said he didnt have a stoke it was a stoke like event   Kaye Parsons [facility] Health Care Management Assistant  Direct Dial: [phone]  Fax: [phone] Website: [email or web address]

## 2023-11-29 ENCOUNTER — Ambulatory Visit: Attending: Cardiology | Admitting: Cardiology

## 2023-11-29 VITALS — BP 150/70 | HR 59 | Resp 16 | Ht 66.0 in | Wt 263.8 lb

## 2023-11-29 DIAGNOSIS — I5032 Chronic diastolic (congestive) heart failure: Secondary | ICD-10-CM

## 2023-11-29 DIAGNOSIS — I10 Essential (primary) hypertension: Secondary | ICD-10-CM | POA: Diagnosis not present

## 2023-11-29 DIAGNOSIS — R072 Precordial pain: Secondary | ICD-10-CM | POA: Diagnosis not present

## 2023-11-29 DIAGNOSIS — F1490 Cocaine use, unspecified, uncomplicated: Secondary | ICD-10-CM

## 2023-11-29 DIAGNOSIS — I2510 Atherosclerotic heart disease of native coronary artery without angina pectoris: Secondary | ICD-10-CM

## 2023-11-29 DIAGNOSIS — I25118 Atherosclerotic heart disease of native coronary artery with other forms of angina pectoris: Secondary | ICD-10-CM | POA: Diagnosis not present

## 2023-11-29 DIAGNOSIS — R002 Palpitations: Secondary | ICD-10-CM

## 2023-11-29 DIAGNOSIS — R079 Chest pain, unspecified: Secondary | ICD-10-CM

## 2023-11-29 DIAGNOSIS — E119 Type 2 diabetes mellitus without complications: Secondary | ICD-10-CM

## 2023-11-29 LAB — BASIC METABOLIC PANEL WITH GFR
BUN/Creatinine Ratio: 12 (ref 10–24)
BUN: 13 mg/dL (ref 8–27)
CO2: 26 mmol/L (ref 20–29)
Calcium: 9.5 mg/dL (ref 8.6–10.2)
Chloride: 103 mmol/L (ref 96–106)
Creatinine, Ser: 1.05 mg/dL (ref 0.76–1.27)
Glucose: 147 mg/dL — ABNORMAL HIGH (ref 70–99)
Potassium: 4.1 mmol/L (ref 3.5–5.2)
Sodium: 143 mmol/L (ref 134–144)
eGFR: 76 mL/min/{1.73_m2} (ref >59)

## 2023-11-29 LAB — PRO B NATRIURETIC PEPTIDE: NT-Pro BNP: 112 pg/mL (ref 0–376)

## 2023-11-29 MED ORDER — HYDRALAZINE HCL 100 MG PO TABS: 100.0000 mg | ORAL_TABLET | Freq: Two times a day (BID) | ORAL | 3 refills | Status: AC

## 2023-11-29 MED ORDER — ISOSORBIDE MONONITRATE ER 60 MG PO TB24: 60.0000 mg | ORAL_TABLET | Freq: Every day | ORAL | 3 refills | Status: AC

## 2023-11-29 NOTE — Progress Notes (Signed)
 Cardiology Office Note:  .    ID:  Vincent Ramos, DOB [birth year], MRN [id number] PCP:  Shannan Dart., FNP  Former Cardiology Providers: Dr. Stann Earnest and Dr. Idalia [facility] Providers Cardiologist:  Olinda Bertrand, DO , [facility] (established care 11/29/23) Electrophysiologist:  None  Click to update primary MD,subspecialty MD or APP then REFRESH:1}    Chief Complaint  Patient presents with   Grade II diastolic dysfunction   Chronic heart failure with preserved ejection fraction   Follow-up    History of Present Illness: .   Vincent Ramos is a 73 y.o. African-American male whose past medical history and cardiovascular risk factors includes: Cocaine use, recent TIA / stroke treated with TNK , OSA, COPD HFpEF, hypertension, COPD, hyperlipidemia, diabetes mellitus type 2, nonobstructive CAD status post left heart, obesity due to excess calories.  Formally under the care of Dr. Avery Bodo who last saw Jep Stjulian Biehl back in January 2023. I am seeing him for the first time to re-establishing care.   In January 2025 patient presented to [facility] due to acute onset of double vision, difficulty walking, slurred speech.  He was treated with TNK at [facility] and transferred to [facility] for post TNK and stroke workup.  Working diagnosis with acute ischemic infarct involving the posterior circulation not seen on MRI status post TNK.  MRI was negative for stroke.  Echocardiogram noted preserved LVEF, LDL 75 mg/dL, urine drug screen positive for cocaine.  Precordial pain: Describes it as tightness like sensation. Has been occurring more frequently with exertion like walking or climbing up stairs. Symptoms resolved with resting. Has been using nitroglycerin  tablets as well as inhalers which both provide relief.  Patient also experiences shortness of breath especially when he lays flat, he prefers to lay/sleeping on the lounge chair.  When he does sleep on his bed  he requires at least 3 pillows.  He does have sleep apnea but cannot tolerate the CPAP machine.  In January 2025 patient was treated for suspected stroke but imaging did not confirm the presence of CVA.  Patient states that he has not used cocaine since January 2025 after the ER visit for suspected stroke.  Review of Systems: .   Review of Systems  Cardiovascular:  Positive for chest pain and dyspnea on exertion. Negative for claudication, irregular heartbeat, leg swelling, near-syncope, orthopnea, palpitations, paroxysmal nocturnal dyspnea and syncope.  Respiratory:  Positive for shortness of breath.   Hematologic/Lymphatic: Negative for bleeding problem.    Studies Reviewed:   EKG: EKG Interpretation Date/Time:  Wednesday November 29 2023 08:46:54 EDT Ventricular Rate:  61 PR Interval:  218 QRS Duration:  78 QT Interval:  436 QTC Calculation: 438 R Axis:   22  Text Interpretation: Sinus rhythm with 1st degree A-V block Anteroseptal infarct , age undetermined When compared with ECG of 04-Aug-2023 19:42, No significant change since last tracing Confirmed by Olinda Bertrand [phone]) on 11/29/2023 9:09:11 AM  Echocardiogram: 09/2022: LVEF 70 to 75%, LVH severe, grade 2 diastolic dysfunction, right ventricular size is mildly enlarged, right ventricular function normal, biatrial moderate dilatation estimated RAP 8 mmHg.  01/02025 LVEF 55 to 60%, severe LVH, right ventricular size and function normal, aortic root 38 mm, no significant valvular heart disease  Stress Testing: Myocardial perfusion imaging: 03/2023: Low restudy  Left and right heart catheterization: 11/2017 Widely patent coronary arteries with normal left main, 30% mid LAD, luminal irregularities in the proximal and mid circumflex,  and 30 to 40% stenosis in the mid RCA. Mild global left ventricular systolic dysfunction with EF 40 to 45%.  Severely elevated end-diastolic pressure consistent with chronic combined systolic and  diastolic heart failure.  Severe poorly controlled blood pressure during the procedure. Severe BP elevation during the procedure requiring IV hydralazine , labetalol , and IV nitroglycerin  drip. Stable right femoral after Mynx arteriotomy closure device.   RECOMMENDATIONS:   IV Lasix  x1 and more aggressive outpatient diuresis Guideline directed therapy for systolic/diastolic heart failure.    RADIOLOGY: NA  Risk Assessment/Calculations:   NA   Labs:       Latest Ref Rng & Units 08/06/2023    4:34 AM 08/04/2023    7:51 PM 08/04/2023    7:47 PM  CBC  WBC 4.0 - 10.5 K/uL 5.9   6.8   Hemoglobin 13.0 - 17.0 g/dL 27.2  53.6  64.4   Hematocrit 39.0 - 52.0 % 45.4  47.0  47.1   Platelets 150 - 400 K/uL 109   121        Latest Ref Rng & Units 11/29/2023   10:10 AM 08/06/2023    4:34 AM 08/04/2023    7:51 PM  BMP  Glucose 70 - 99 mg/dL 034  742  595   BUN 8 - 27 mg/dL 13  14  15    Creatinine 0.76 - 1.27 mg/dL 6.38  7.56  4.33   BUN/Creat Ratio 10 - 24 12     Sodium 134 - 144 mmol/L 143  134  136   Potassium 3.5 - 5.2 mmol/L 4.1  3.6  3.6   Chloride 96 - 106 mmol/L 103  97  97   CO2 20 - 29 mmol/L 26  27    Calcium  8.6 - 10.2 mg/dL 9.5  8.9        Latest Ref Rng & Units 11/29/2023   10:10 AM 08/06/2023    4:34 AM 08/04/2023    7:51 PM  CMP  Glucose 70 - 99 mg/dL 295  188  416   BUN 8 - 27 mg/dL 13  14  15    Creatinine 0.76 - 1.27 mg/dL 6.06  3.01  6.01   Sodium 134 - 144 mmol/L 143  134  136   Potassium 3.5 - 5.2 mmol/L 4.1  3.6  3.6   Chloride 96 - 106 mmol/L 103  97  97   CO2 20 - 29 mmol/L 26  27    Calcium  8.6 - 10.2 mg/dL 9.5  8.9      Lab Results  Component Value Date   CHOL 133 08/06/2023   HDL 37 (L) 08/06/2023   LDLCALC 75 08/06/2023   TRIG 105 08/06/2023   CHOLHDL 3.6 08/06/2023   No results for input(s): "LIPOA" in the last 8760 hours. No components found for: "NTPROBNP" Recent Labs    03/10/23 1219 11/29/23 1010  PROBNP 397* 112   No results for input(s):  "TSH" in the last 8760 hours.  Physical Exam:    Today's Vitals   11/29/23 0843  BP: (!) 150/70  Pulse: (!) 59  Resp: 16  SpO2: 95%  Weight: 263 lb 12.8 oz (119.7 kg)  Height: 5\' 6"  (1.676 m)   Body mass index is 42.58 kg/m. Wt Readings from Last 3 Encounters:  11/29/23 263 lb 12.8 oz (119.7 kg)  10/05/23 266 lb (120.7 kg)  08/05/23 229 lb 8 oz (104.1 kg)    Physical Exam  Constitutional: No distress.  hemodynamically stable  Neck: No JVD present.  Cardiovascular: Normal rate, regular rhythm, S1 normal and S2 normal. Exam reveals no gallop, no S3 and no S4.  No murmur heard. Pulmonary/Chest: Effort normal and breath sounds normal. No stridor. He has no wheezes. He has no rales.  Musculoskeletal:        General: No edema.     Cervical back: Neck supple.  Skin: Skin is warm.     Impression & Recommendation(s):  Impression:   ICD-10-CM   1. Chronic heart failure with preserved ejection fraction (HCC)  I50.32 EKG 12-Lead    Pro b natriuretic peptide (BNP)    Basic metabolic panel with GFR    2. Precordial pain  R07.2 CT CORONARY MORPH W/CTA COR W/SCORE W/CA W/CM &/OR WO/CM    isosorbide  mononitrate (IMDUR ) 60 MG 24 hr tablet    3. Coronary artery disease involving native coronary artery of native heart with other form of angina pectoris (HCC)  I25.118     4. Essential hypertension  I10 hydrALAZINE  (APRESOLINE ) 100 MG tablet    Basic metabolic panel with GFR    5. Type 2 diabetes mellitus without complication, without long-term current use of insulin  (HCC)  E11.9     6. Morbid obesity (HCC)  E66.01     7. Cocaine use  F14.90        Recommendation(s):  Chronic heart failure with preserved ejection fraction (HCC) NYHA class II/III. Continue olmesartan /hydrochlorothiazide  40/25 mg p.o. daily. Continue torsemide  20 mg p.o. daily.  increase hydralazine  100 mg p.o. daily to twice daily dosing. Increase Imdur  from 30 mg p.o. daily to 60 mg p.o. daily Check BMP and  BNP Strict I's and O's, daily weights. Patient is advised to go to ER if he has worsening shortness of breath or volume overload.  Precordial pain Coronary artery disease involving native coronary artery of native heart with other form of angina pectoris (HCC) Symptoms suggestive of possible cardiac etiology. Plan coronary CTA to evaluate for CAC, plaque volume, nonobstructive disease Patient is already on Nebivolol  20 mg p.o. twice daily.  I have educated him that he cannot be on cocaine while on beta-blocker therapy.  Recommended that we change him to a calcium  channel blocker but patient refuses.  Essential hypertension Office and home blood pressures are not well-controlled. Medication changes as discussed above  Type 2 diabetes mellitus without complication, without long-term current use of insulin  (HCC) Reemphasized importance of glycemic control. Last hemoglobin A1c 8.7 as of January 2025. Currently on ARB, statin therapy, Ozempic  Cocaine use Last use of cocaine January 2025. Patient states that he has no plans on restarting cocaine at any time soon given the scare that he had in January as he presented with findings concerning for stroke.   Orders Placed:  Orders Placed This Encounter  Procedures   CT CORONARY MORPH W/CTA COR W/SCORE W/CA W/CM &/OR WO/CM    Standing Status:   Future    Expiration Date:   11/28/2024    If indicated for the ordered procedure, I authorize the administration of contrast media per Radiology protocol:   Yes    Initiate Coronary CTA Adult Protocol:   Yes    If indicated initiate Post Coronary CTA Hypotension Adult Protocol:   Yes    Does the patient have a contrast media/X-ray dye allergy?:   No    [facility]    Authorization::   FFR will be ordered if deemed medically necessary  Pro b natriuretic peptide (BNP)   Basic metabolic panel with GFR   EKG [id number]     Final Medication List:    Meds  ordered this encounter  Medications   isosorbide  mononitrate (IMDUR ) 60 MG 24 hr tablet    Sig: Take 1 tablet (60 mg total) by mouth daily.    Dispense:  90 tablet    Refill:  3    Dose change (increased on 11/29/2023)   hydrALAZINE  (APRESOLINE ) 100 MG tablet    Sig: Take 1 tablet (100 mg total) by mouth 2 (two) times daily.    Dispense:  180 tablet    Refill:  3    Dose change (increased on 11/29/2023)    Medications Discontinued During This Encounter  Medication Reason   Cholecalciferol (VITAMIN D-3 PO) Patient Preference   VITAMIN E PO Patient Preference   isosorbide  mononitrate (IMDUR ) 30 MG 24 hr tablet Dose change   hydrALAZINE  (APRESOLINE ) 100 MG tablet Dose change     Current Outpatient Medications:    albuterol  (VENTOLIN  HFA) 108 (90 Base) MCG/ACT inhaler, Inhale 2 puffs into the lungs every 6 (six) hours as needed for wheezing or shortness of breath., Disp: , Rfl:    atorvastatin  (LIPITOR) 40 MG tablet, Take 1 tablet (40 mg total) by mouth daily., Disp: 30 tablet, Rfl: 0   clopidogrel  (PLAVIX ) 75 MG tablet, Take 1 tablet (75 mg total) by mouth daily., Disp: 90 tablet, Rfl: 3   gabapentin  (NEURONTIN ) 300 MG capsule, Take 300-600 mg by mouth daily., Disp: , Rfl:    glipiZIDE  (GLUCOTROL ) 10 MG tablet, Take 10 mg by mouth 2 (two) times daily before a meal., Disp: , Rfl:    hydrALAZINE  (APRESOLINE ) 100 MG tablet, Take 1 tablet (100 mg total) by mouth 2 (two) times daily., Disp: 180 tablet, Rfl: 3   isosorbide  mononitrate (IMDUR ) 60 MG 24 hr tablet, Take 1 tablet (60 mg total) by mouth daily., Disp: 90 tablet, Rfl: 3   montelukast  (SINGULAIR ) 10 MG tablet, Take 10 mg by mouth at bedtime., Disp: , Rfl:    Nebivolol  HCl 20 MG TABS, Take 20 mg by mouth in the morning and at bedtime., Disp: , Rfl:    olmesartan -hydrochlorothiazide  (BENICAR  HCT) 40-25 MG tablet, Take 1 tablet by mouth daily., Disp: , Rfl:    OZEMPIC, 0.25 OR 0.5 MG/DOSE, 2 MG/3ML SOPN, Inject 0.25 mg into the skin  every Saturday. For 4 weeks.Then increase to 0.5 mg weekly., Disp: , Rfl:    Potassium Chloride  ER 20 MEQ TBCR, Take 1 tablet by mouth daily., Disp: , Rfl:    rosuvastatin  (CRESTOR ) 40 MG tablet, Take 40 mg by mouth daily., Disp: , Rfl:    tamsulosin  (FLOMAX ) 0.4 MG CAPS capsule, Take 0.4 mg by mouth daily., Disp: , Rfl:    torsemide  (DEMADEX ) 20 MG tablet, TAKE 1 TABLET BY MOUTH EVERY DAY. MAY TAKE AN ADDITIONAL TABLET AS NEEDED, Disp: 135 tablet, Rfl: 2   TRELEGY ELLIPTA 200-62.5-25 MCG/ACT AEPB, Take 1 puff by mouth daily., Disp: , Rfl:    TRESIBA  FLEXTOUCH 100 UNIT/ML FlexTouch Pen, INJECT 50 UNITS UNDER THE SKIN EVERY NIGHT AT BEDTIME AND 10 UNITS EVERY MORNING, Disp: , Rfl:    nitroGLYCERIN  (NITROSTAT ) 0.4 MG SL tablet, Place 1 tablet (0.4 mg total) under the tongue every 5 (five) minutes as needed., Disp: 25 tablet, Rfl: 3  Consent:   NA  Disposition:   6 month sooner if needed.  His questions and concerns were addressed to  his satisfaction. He voices understanding of the recommendations provided during this encounter.    Signed, Awilda Bogus, [facility]  [facility]  12/03/2023 7:57 PM

## 2023-11-29 NOTE — Patient Instructions (Addendum)
 Medication Instructions:  Your physician has recommended you make the following change in your medication:   1) INCREASE hydralazine  to 100 mg twice daily 2) INCREASE isosorbide  mononitrate (Imdur ) to 60 mg daily  *If you need a refill on your cardiac medications before your next appointment, please call your pharmacy*  Lab Work: TODAY (lab on 1st floor): BMP, BNP If you have labs (blood work) drawn today and your tests are completely normal, you will receive your results only by: MyChart Message (if you have MyChart) OR A paper copy in the mail If you have any lab test that is abnormal or we need to change your treatment, we will call you to review the results.  Testing/Procedures: Your physician has requested that you have cardiac CT. Cardiac computed tomography (CT) is a painless test that uses an x-ray machine to take clear, detailed pictures of your heart. For further information please visit [email or web address] Please follow instruction sheet as given.  Follow-Up: At [facility], you and your health needs are our priority.  As part of our continuing mission to provide you with exceptional heart care, our providers are all part of one team.  This team includes your primary Cardiologist (physician) and Advanced Practice Providers or APPs (Physician Assistants and Nurse Practitioners) who all work together to provide you with the care you need, when you need it.  Your next appointment:   6 month(s)  Provider:   Olinda Bertrand, DO   We recommend signing up for the patient portal called "MyChart".  Sign up information is provided on this After Visit Summary.  MyChart is used to connect with patients for Virtual Visits (Telemedicine).  Patients are able to view lab/test results, encounter notes, upcoming appointments, etc.  Non-urgent messages can be sent to your provider as well.   To learn more about what you can do with MyChart, go to [email or web address].   Other  Instructions   Your cardiac CT will be scheduled at one of the below locations:   [facility] [address] [place], [place] [address] [phone]  OR  [facility] [address] [place], [place] [address] [phone]  OR   [facility] [address] [place], [place] [address] [phone]  OR   [facility] [address] [place], [place] [address] [phone]  OR   [facility] and Vascular Tower [address]  [place], [place] [address] Opening November 27, 2023  If scheduled at [facility], please arrive at the [facility] and Children's Entrance (Entrance C2) of [facility] 30 minutes prior to test start time. You can use the FREE valet parking offered at entrance C (encouraged to control the heart rate for the test)  Proceed to the [facility] Radiology Department (first floor) to check-in and test prep.   All radiology patients and guests should use entrance C2 at [facility], accessed from [facility], even though the hospital's physical address listed is [address].    If scheduled at the Heart and Vascular Tower at [address], please enter the parking lot using the [address] entrance and use the FREE valet service at the patient drop-off area. Enter the buidling and check-in with registration on the main floor.  If scheduled at [facility] or [facility], please arrive 15 mins early for check-in and test prep.  There is spacious parking  and easy access to the radiology department from the [facility] Heart and Vascular entrance. Please enter here and check-in with the desk attendant.   If scheduled at [facility], please arrive 30 minutes early for check-in and test prep.  Please follow these instructions carefully (unless otherwise  directed):  An IV will be required for this test and Nitroglycerin  will be given.  Hold all erectile dysfunction medications at least 3 days (72 hrs) prior to test. (Ie viagra, cialis, sildenafil, tadalafil, etc)   On the Night Before the Test: Be sure to Drink plenty of water. Do not consume any caffeinated/decaffeinated beverages or chocolate 12 hours prior to your test. Do not take any antihistamines 12 hours prior to your test.  On the Day of the Test: Drink plenty of water until 1 hour prior to the test. Do not eat any food 1 hour prior to test. You may take your regular medications prior to the test.  Take metoprolol  (Lopressor ) two hours prior to test. If you take Furosemide /Hydrochlorothiazide /Spironolactone /Chlorthalidone, please HOLD on the morning of the test. Patients who wear a continuous glucose monitor MUST remove the device prior to scanning.  After the Test: Drink plenty of water. After receiving IV contrast, you may experience a mild flushed feeling. This is normal. On occasion, you may experience a mild rash up to 24 hours after the test. This is not dangerous. If this occurs, you can take Benadryl  25 mg, Zyrtec, Claritin, or Allegra and increase your fluid intake. (Patients taking Tikosyn should avoid Benadryl , and may take Zyrtec, Claritin, or Allegra) If you experience trouble breathing, this can be serious. If it is severe call 911 IMMEDIATELY. If it is mild, please call our office.  We will call to schedule your test 2-4 weeks out understanding that some insurance companies will need an authorization prior to the service being performed.   For more information and frequently asked questions, please visit our website : [email or web address]  For non-scheduling related questions, please contact the cardiac imaging nurse navigator should you have any questions/concerns: Cardiac Imaging Nurse Navigators Direct Office Dial: [phone]   For scheduling  needs, including cancellations and rescheduling, please call [place], [phone].

## 2023-12-03 ENCOUNTER — Encounter: Payer: Self-pay | Admitting: Cardiology

## 2023-12-15 ENCOUNTER — Telehealth (HOSPITAL_COMMUNITY): Payer: Self-pay | Admitting: *Deleted

## 2023-12-15 NOTE — Telephone Encounter (Signed)
 Attempted to call patient regarding upcoming cardiac CT appointment. Left message on voicemail with name and callback number  Larey Brick RN Navigator Cardiac Imaging [facility] Heart and Vascular Services [phone] Office [phone] Cell

## 2023-12-18 ENCOUNTER — Ambulatory Visit (HOSPITAL_COMMUNITY)
Admission: RE | Admit: 2023-12-18 | Discharge: 2023-12-18 | Disposition: A | Discharge status: Home / Self Care | Source: Ambulatory Visit | Attending: Cardiology | Admitting: Cardiology

## 2023-12-18 DIAGNOSIS — R072 Precordial pain: Secondary | ICD-10-CM | POA: Diagnosis present

## 2023-12-18 MED ORDER — NITROGLYCERIN 0.4 MG SL SUBL
SUBLINGUAL_TABLET | SUBLINGUAL | Status: AC
  Filled 2023-12-18: qty 2

## 2023-12-18 MED ORDER — IOHEXOL 350 MG/ML SOLN
100.0000 mL | Freq: Once | INTRAVENOUS | Status: AC | PRN
  Administered 2023-12-18: 100 mL via INTRAVENOUS

## 2023-12-18 MED ORDER — NITROGLYCERIN 0.4 MG SL SUBL
0.8000 mg | SUBLINGUAL_TABLET | Freq: Once | SUBLINGUAL | Status: AC
  Administered 2023-12-18: 0.8 mg via SUBLINGUAL

## 2023-12-18 NOTE — Progress Notes (Signed)
 Patient reports he didn't take his daily BP medications today. Discussed with Dr. Maximo Spar. Patient ok to go home and take his home medications and re-check his BP. Patient informed that he should seek medical attention if his BP is still elevated after taking his medications.

## 2023-12-25 ENCOUNTER — Ambulatory Visit: Payer: Self-pay | Admitting: Cardiology

## 2023-12-26 NOTE — Telephone Encounter (Signed)
Patient is calling to follow up on results. Please advise

## 2024-04-01 NOTE — Progress Notes (Unsigned)
 New Patient Pulmonology Office Visit   Subjective:  Patient ID: Vincent Ramos, male    DOB: 1951-05-16  MRN: [id number]  Referred by: Arloa Jarvis, NP  CC: No chief complaint on file.   HPI Vincent Ramos is a 73 y.o. male with HTN, HLD, DM, CAD who presents for initial evaluation of ***.    CTD Symptoms:   PMH:  - Hx of TB ***  - Hx of endemic fungal infections ***  - Hx of Sarcoidosis ***  - Hx of CTD ***  - Hx of lymphoma or other hematological malignancies ***  - Hx of irradiation to the chest or immunotherapy ***   PSH:   Medications:   Allergies:   Social Hx: - Smoking ***  - Second hand smoking ***  - Vaping ***  - Alcohol ***  - Illicit substances ***  - Indoor emission from household combustion ***  - Hobbies (e.g. wood work, Surveyor, quantity, bird breeding etc...)  - Landscape architect (e.g. mold) ***  - Pets ***  - Birds ***   Occupational exposures:  [ ]  Asbestos - Holiday representative workers, Medical sales representative, Therapist, sports, railroad, Occupational hygienist radiation - uranium mining  [ ]  Vinyl chloride - pulp & paper workers  [ ]  Arsenic - smelting of ores, Chief Executive Officer, Technical sales engineer  [ ]  Beryllium - Archivist workers, Geophysical data processor, Pensions consultant workers, jewelers  [ ]  chromium - Financial trader, welding, tanning industries  [ ]  Nickel - Chief Strategy Officer, Naval architect, Physiological scientist, glass workers, Health and safety inspector, metal workers   Family Hx:  - Lung disease ***  - CTD ***  - Sarcoidosis ***  - Cancer ***    {PULM QUESTIONNAIRES (Optional):33196}  ROS  Allergies: Amlodipine , Novolog  [insulin  aspart (human analog) (yeast)], and Victoza [liraglutide]  Current Outpatient Medications:    albuterol  (VENTOLIN  HFA) 108 (90 Base) MCG/ACT inhaler, Inhale 2 puffs into the lungs every 6 (six) hours as needed for wheezing or shortness of breath., Disp: , Rfl:    atorvastatin  (LIPITOR) 40 MG tablet, Take 1 tablet (40 mg total) by  mouth daily., Disp: 30 tablet, Rfl: 0   clopidogrel  (PLAVIX ) 75 MG tablet, Take 1 tablet (75 mg total) by mouth daily., Disp: 90 tablet, Rfl: 3   gabapentin  (NEURONTIN ) 300 MG capsule, Take 300-600 mg by mouth daily., Disp: , Rfl:    glipiZIDE  (GLUCOTROL ) 10 MG tablet, Take 10 mg by mouth 2 (two) times daily before a meal., Disp: , Rfl:    hydrALAZINE  (APRESOLINE ) 100 MG tablet, Take 1 tablet (100 mg total) by mouth 2 (two) times daily., Disp: 180 tablet, Rfl: 3   isosorbide  mononitrate (IMDUR ) 60 MG 24 hr tablet, Take 1 tablet (60 mg total) by mouth daily., Disp: 90 tablet, Rfl: 3   montelukast  (SINGULAIR ) 10 MG tablet, Take 10 mg by mouth at bedtime., Disp: , Rfl:    Nebivolol  HCl 20 MG TABS, Take 20 mg by mouth in the morning and at bedtime., Disp: , Rfl:    nitroGLYCERIN  (NITROSTAT ) 0.4 MG SL tablet, Place 1 tablet (0.4 mg total) under the tongue every 5 (five) minutes as needed., Disp: 25 tablet, Rfl: 3   olmesartan -hydrochlorothiazide  (BENICAR  HCT) 40-25 MG tablet, Take 1 tablet by mouth daily., Disp: , Rfl:    OZEMPIC, 0.25 OR 0.5 MG/DOSE, 2 MG/3ML SOPN, Inject 0.25 mg into the skin every Saturday. For 4 weeks.Then increase to 0.5 mg weekly., Disp: , Rfl:    Potassium Chloride  ER 20 MEQ  TBCR, Take 1 tablet by mouth daily., Disp: , Rfl:    rosuvastatin  (CRESTOR ) 40 MG tablet, Take 40 mg by mouth daily., Disp: , Rfl:    tamsulosin  (FLOMAX ) 0.4 MG CAPS capsule, Take 0.4 mg by mouth daily., Disp: , Rfl:    torsemide  (DEMADEX ) 20 MG tablet, TAKE 1 TABLET BY MOUTH EVERY DAY. MAY TAKE AN ADDITIONAL TABLET AS NEEDED, Disp: 135 tablet, Rfl: 2   TRELEGY ELLIPTA 200-62.5-25 MCG/ACT AEPB, Take 1 puff by mouth daily., Disp: , Rfl:    TRESIBA  FLEXTOUCH 100 UNIT/ML FlexTouch Pen, INJECT 50 UNITS UNDER THE SKIN EVERY NIGHT AT BEDTIME AND 10 UNITS EVERY MORNING, Disp: , Rfl:  Past Medical History:  Diagnosis Date   Abnormal nuclear cardiac imaging test 12/27/2017   Acute on chronic diastolic CHF (congestive  heart failure) (HCC) 09/17/2017   Acute respiratory failure with hypoxia (HCC) 06/01/2017   Arthritis    Asthma    CAD (coronary artery disease)    a. 11/2017: cath showing widely patent coronary arteries with normal left main, 30% mid LAD, luminal irregularities in the proximal and mid circumflex, and 30 to 40% stenosis in the mid RCA.   CAP (community acquired pneumonia) 07/31/2014   Community acquired pneumonia 07/31/2014   Constipation 02/06/2018   COPD exacerbation (HCC) 09/17/2017   Dysphagia    Elevated troponin 09/17/2017   Enlarged prostate    Grade II diastolic dysfunction    Hypertension    Lumbar stenosis 10/07/2015   Obesity 07/31/2014   PNA (pneumonia) 05/30/2017   Pneumonia    Pneumonia    Polyp of rectum    Respiratory failure (HCC) 09/18/2017   SBO (small bowel obstruction) (HCC) 05/08/2019   Shortness of breath    Sleep apnea    Sleep Study 08/21/17: Severe OSA   Type 2 diabetes mellitus (HCC)    Past Surgical History:  Procedure Laterality Date   ANTERIOR CERVICAL DECOMP/DISCECTOMY FUSION N/A 09/17/2020   Procedure: Anterior Cevrvical Decompresion / Discectomy Fusion Cervical four-five,Cervical five-six,Cervical six- seven;  Surgeon: Debby Dorn MATSU, MD;  Location: [facility] OR;  Service: Neurosurgery;  Laterality: N/A;   CIRCUMCISION     COLONOSCOPY WITH PROPOFOL  N/A 08/08/2017   Procedure: COLONOSCOPY WITH PROPOFOL ;  Surgeon: Harvey Margo CROME, MD;  Location: [facility] ENDO SUITE;  Service: Endoscopy;  Laterality: N/A;  1:00pm   ESOPHAGEAL DILATION N/A 06/07/2017   Procedure: ESOPHAGEAL DILATION;  Surgeon: Shaaron Lamar HERO, MD;  Location: [facility] ENDO SUITE;  Service: Endoscopy;  Laterality: N/A;   ESOPHAGOGASTRODUODENOSCOPY (EGD) WITH PROPOFOL  N/A 06/07/2017   Procedure: ESOPHAGOGASTRODUODENOSCOPY (EGD) WITH PROPOFOL ;  Surgeon: Shaaron Lamar HERO, MD;  Location: [facility] ENDO SUITE;  Service: Endoscopy;  Laterality: N/A;   HYDROCELE EXCISION  10/14/2011   Procedure: HYDROCELECTOMY ADULT;  Surgeon:  Norleen JINNY Seltzer, MD;  Location: [facility] ORS;  Service: Urology;  Laterality: Right;   LACERATION REPAIR     left hand   LUMBAR LAMINECTOMY/DECOMPRESSION MICRODISCECTOMY Right 10/07/2015   Procedure: Right Lumbar four-five ,lumbar five sacral-one Laminectomy;  Surgeon: Catalina Stains, MD;  Location: [facility] NEURO ORS;  Service: Neurosurgery;  Laterality: Right;   RIGHT/LEFT HEART CATH AND CORONARY ANGIOGRAPHY N/A 12/27/2017   Procedure: RIGHT/LEFT HEART CATH AND CORONARY ANGIOGRAPHY;  Surgeon: Claudene Victory ORN, MD;  Location: [facility] INVASIVE CV LAB;  Service: Cardiovascular;  Laterality: N/A;   Family History  Problem Relation Age of Onset   Diabetes Other    Hypertension Other    CAD Other    Cancer Other  Lung cancer Mother        smoker   Diabetes Sister    Hypertension Sister    Anesthesia problems Neg Hx    Hypotension Neg Hx    Malignant hyperthermia Neg Hx    Pseudochol deficiency Neg Hx    Colon cancer Neg Hx    Colon polyps Neg Hx    Social History   Socioeconomic History   Marital status: Married    Spouse name: Not on file   Number of children: Not on file   Years of education: Not on file   Highest education level: Not on file  Occupational History   Not on file  Tobacco Use   Smoking status: Never   Smokeless tobacco: Never  Vaping Use   Vaping status: Never Used  Substance and Sexual Activity   Alcohol use: Yes    Alcohol/week: 1.0 standard drink of alcohol    Types: 1 Cans of beer per week    Comment: beer once a month   Drug use: No   Sexual activity: Yes    Birth control/protection: None  Other Topics Concern   Not on file  Social History Narrative   Not on file   Social Drivers of Health   Financial Resource Strain: Not on file  Food Insecurity: No Food Insecurity (09/19/2022)   Hunger Vital Sign    Worried About Running Out of Food in the Last Year: Never true    Ran Out of Food in the Last Year: Never true  Transportation Needs: Not on file  Physical  Activity: Not on file  Stress: Not on file  Social Connections: Not on file  Intimate Partner Violence: Not on file       Objective:  There were no vitals taken for this visit. {Pulm Vitals (Optional):32837}  Physical Exam  Diagnostic Review:  {Labs (Optional):32838}  PFTS 12/2019 independently reviewed by me show evidence of a moderately severe restrictive defect. Flow volume loop with decreased flow rates at end expiration suggestive of occult/borderline obstructive defect.  CT coronaries 5/25 reviewed independently and shows evidence of elevated right hemidiaphragm with atelectasis of RLL. This was present since CTA chest 12/2016. There's minimal emphysema in upper lobes.    Assessment & Plan:   Assessment & Plan   No orders of the defined types were placed in this encounter.     No follow-ups on file.   Kayda Allers, MD

## 2024-04-02 ENCOUNTER — Ambulatory Visit: Admitting: Pulmonary Disease

## 2024-04-02 ENCOUNTER — Ambulatory Visit (HOSPITAL_COMMUNITY)
Admission: RE | Admit: 2024-04-02 | Discharge: 2024-04-02 | Disposition: A | Discharge status: Home / Self Care | Source: Ambulatory Visit | Attending: Pulmonary Disease | Admitting: Pulmonary Disease

## 2024-04-02 ENCOUNTER — Ambulatory Visit: Payer: Self-pay | Admitting: Pulmonary Disease

## 2024-04-02 ENCOUNTER — Encounter: Payer: Self-pay | Admitting: Pulmonary Disease

## 2024-04-02 VITALS — BP 172/72 | HR 77 | Ht 66.0 in | Wt 266.0 lb

## 2024-04-02 DIAGNOSIS — R0609 Other forms of dyspnea: Secondary | ICD-10-CM

## 2024-04-02 DIAGNOSIS — G4730 Sleep apnea, unspecified: Secondary | ICD-10-CM | POA: Diagnosis not present

## 2024-04-02 DIAGNOSIS — J4530 Mild persistent asthma, uncomplicated: Secondary | ICD-10-CM

## 2024-04-02 DIAGNOSIS — Z6841 Body Mass Index (BMI) 40.0 and over, adult: Secondary | ICD-10-CM

## 2024-04-02 DIAGNOSIS — I5032 Chronic diastolic (congestive) heart failure: Secondary | ICD-10-CM

## 2024-04-02 DIAGNOSIS — I10 Essential (primary) hypertension: Secondary | ICD-10-CM

## 2024-04-02 DIAGNOSIS — J189 Pneumonia, unspecified organism: Secondary | ICD-10-CM

## 2024-04-02 MED ORDER — BUDESONIDE-FORMOTEROL FUMARATE 160-4.5 MCG/ACT IN AERO: 2.0000 | INHALATION_SPRAY | Freq: Two times a day (BID) | RESPIRATORY_TRACT | 6 refills | Status: AC

## 2024-04-02 MED ORDER — LEVOFLOXACIN 750 MG PO TABS: 750.0000 mg | ORAL_TABLET | Freq: Every day | ORAL | 0 refills | Status: AC

## 2024-04-02 NOTE — Patient Instructions (Signed)
-   Please STOP Trelegy and START Symbicort  2 puffs in morning and 2 puffs before bed time - Can use albuterol  2 puffs every 4 hours as needed - Please do the nebulizer twice daily - Get the blood test and CXR done - Get the sleep study done - Take 2 tablets of the fluid pill every day - Try to lose weight as much as you could

## 2024-04-02 NOTE — Telephone Encounter (Signed)
 I called patient about CXR result. Patient has some pulmonary edema and LLL infiltrate concerning for pneumonia. Will treat him with levofloxacin  for 5 days and I already advised him to start Torsemide  20 mg (2 tabs) daily. The patient is in agreement. Will repeat CXR when he comes back for follow up.

## 2024-04-03 LAB — BASIC METABOLIC PANEL WITH GFR
BUN/Creatinine Ratio: 12 (ref 10–24)
BUN: 12 mg/dL (ref 8–27)
CO2: 26 mmol/L (ref 20–29)
Calcium: 9.2 mg/dL (ref 8.6–10.2)
Chloride: 100 mmol/L (ref 96–106)
Creatinine, Ser: 1 mg/dL (ref 0.76–1.27)
Glucose: 98 mg/dL (ref 70–99)
Potassium: 4.2 mmol/L (ref 3.5–5.2)
Sodium: 140 mmol/L (ref 134–144)
eGFR: 80 mL/min/1.73 (ref >59)

## 2024-04-03 LAB — CBC
Hematocrit: 46.8 % (ref 37.5–51.0)
Hemoglobin: 14.7 g/dL (ref 13.0–17.7)
MCH: 27 pg (ref 26.6–33.0)
MCHC: 31.4 g/dL — ABNORMAL LOW (ref 31.5–35.7)
MCV: 86 fL (ref 79–97)
Platelets: 137 x10E3/uL — ABNORMAL LOW (ref 150–450)
RBC: 5.44 x10E6/uL (ref 4.14–5.80)
RDW: 14.6 % (ref 11.6–15.4)
WBC: 7.1 x10E3/uL (ref 3.4–10.8)

## 2024-04-03 LAB — D-DIMER, QUANTITATIVE: D-DIMER: 0.43 mg{FEU}/L (ref 0.00–0.49)

## 2024-05-27 ENCOUNTER — Ambulatory Visit (HOSPITAL_BASED_OUTPATIENT_CLINIC_OR_DEPARTMENT_OTHER): Attending: Pulmonary Disease | Admitting: Internal Medicine

## 2024-05-27 DIAGNOSIS — G4730 Sleep apnea, unspecified: Secondary | ICD-10-CM | POA: Insufficient documentation

## 2024-05-28 DIAGNOSIS — G4730 Sleep apnea, unspecified: Secondary | ICD-10-CM | POA: Insufficient documentation

## 2024-06-02 NOTE — Procedures (Signed)
 Vincent Ramos [facility] [address] [place], [place] [address] Tel: [phone]   Fax: [phone]  Polysomnography Interpretation  Patient Name:  Vincent Ramos, Vincent Ramos Study Date:  05/27/2024 Referring Physician:  PAULA SOUTHERLY [phone]) %%startinterp%% Indications for Polysomnography The patient is a 73 year-old Male who is 5' 6 and weighs 268.0 lbs. His BMI equals 43.1.  A full night polysomnogram was performed to evaluate for -OSA.  Medication  SYMBICORT    Polysomnogram Data A full night polysomnogram recorded the standard physiologic parameters including EEG, EOG, EMG, EKG, nasal and oral airflow.  Respiratory parameters of chest and abdominal movements were recorded with Respiratory Inductance Plethysmography belts.  Oxygen saturation was recorded by pulse oximetry.   Sleep Architecture The total recording time of the polysomnogram was 440.4 minutes.  The total sleep time was 288.5 minutes.  The patient spent 5.2% of total sleep time in Stage N1, 61.2% in Stage N2, 13.7% in Stages N3, and 19.9% in REM.  Sleep latency was 18.4 minutes.  REM latency was 49.0 minutes.  Sleep Efficiency was 65.5%.  Wake after Sleep Onset time was 133.0 minutes.  Respiratory Events The polysomnogram revealed a presence of - obstructive, - central, and - mixed apneas resulting in an Apnea index of - events per hour.  There were 119 hypopneas (>=3% desaturation and/or arousal) resulting in an Apnea\Hypopnea Index (AHI >=3% desaturation and/or arousal) of 24.7 events per hour.  There were 78 hypopneas (>=4% desaturation) resulting in an Apnea\Hypopnea Index (AHI >=4% desaturation) of 16.2 events per hour.  There were 30 Respiratory Effort Related Arousals resulting in a RERA index of 6.2 events per hour. The Respiratory Disturbance Index is 31.0 events per hour.  The snore index was 325.9 events per hour.  Mean oxygen saturation was 90.9%.  The lowest oxygen saturation during sleep  was 76.0%.  Time spent <=88% oxygen saturation was 70.8 minutes (16.3%).  Limb Activity There were - total limb movements recorded, of this total, - were classified as PLMs.  PLM index was - per hour and PLM associated with Arousals index was - per hour.  Cardiac Summary The average pulse rate was 56.8 bpm.  The minimum pulse rate was 49.0 bpm while the maximum pulse rate was 91.0 bpm.  Cardiac rhythm was normal-PVCs.  Comments: Patient slept in recliner due to back difficulties. Moderate obstructive sleep apnea, AHI (4%) 16.2/hr. Snoring with oxygen desaturation toa nadir of 76%, mean 90.9%.  Diagnosis: Obstructive sleep apnea  Recommendations: Suggest autopap oe CPAP titration sleep study.   This study was personally reviewed and electronically signed by: Dr. Reggy Salt Accredited Board Certified in Sleep Medicine Date/Time: 06/02/24   1:44    %%endinterp%%   Diagnostic PSG Report  Patient Name: Ramos, Vincent Study Date: 05/27/2024  Date of Birth: 1951/02/25 Study Type: Diagnostic  Age: 62 year MRN #: [id number]  Sex: Male Interpreting Physician: SALT REGGY, [id number]  Height: 5' 6 Referring Physician: PAULA SOUTHERLY [phone])  Weight: 268.0 lbs Recording Tech: Dewane Hacker CRT RPSGT RST  BMI: 43.1 Scoring Tech: Dewane Hacker CRT RPSGT RST  ESS: 0 Neck Size: 17   Study Overview  Lights Off: 10:01:12 PM  Count Index  Lights On: 05:21:33 AM Awakenings: 27 5.6  Time in Bed: 440.4 min. Arousals: 130 27.0  Total Sleep Time: 288.5 min. AHI (>=3% Desat and/or Ar.): 119 24.7   Sleep Efficiency: 65.5% AHI (>=4% Desat): 78 16.2   Sleep Latency: 18.4 min. Limb Movements: - -  Wake  After Sleep Onset: 133.0 min. Snore: 1567 325.9  REM Latency from Sleep Onset: 49.0 min. Desaturations: 123 25.6     Minimum SpO2 TST: 76.0%    Sleep Architecture  % of Time in Bed Stages Time (mins) % Sleep Time  Wake 152.0   Stage N1 15.0 5.2%  Stage N2 176.5 61.2%  Stage N3 39.5 13.7%   REM 57.5 19.9%   Arousal Summary   NREM REM Sleep Index  Respiratory Arousals 56 27 83 17.3  PLM Arousals - - - -  Isolated Limb Movement Arousals - - - -  Snore Arousals 11 - 11 2.3  Spontaneous Arousals 34 2 36 7.5  Total 101 29 130 27.0   Limb Movement Summary   Count Index  Isolated Limb Movements - -  Periodic Limb Movements (PLMs) - -  Total Limb Movements - -    Respiratory Summary   By Sleep Stage By Body Position Total   NREM REM Supine Non-Supine   Time (min) 231.0 57.5 - 288.5 288.5         Obstructive Apnea - - - - -  Mixed Apnea - - - - -  Central Apnea - - - - -  Total Apneas - - - - -  Total Apnea Index - - - - -         Hypopneas (>=3% Desat and/or Ar.) 71 48 - 119 119  AHI (>=3% Desat and/or Ar.) 18.4 50.1 - 24.7 24.7         Hypopneas (>=4% Desat) 45 33 - 78 78  AHI (>=4% Desat) 11.7 34.4 - 16.2 16.2          RERAs 25 5 - 30 30  RERA Index 6.5 5.2 - 6.2 6.2         RDI 24.9 55.3 - 31.0 31.0    Respiratory Event Type Index  Central Apneas -  Obstructive Apneas -  Mixed Apneas -  Central Hypopneas 0.4  Obstructive Hypopneas 24.3  Central Apnea + Hypopnea (CAHI) 0.4  Obstructive Apnea + Hypopnea (OAHI) 24.3   Respiratory Event Durations   Apnea Hypopnea   NREM REM NREM REM  Average (seconds) - - 25.4 27.8  Maximum (seconds) - - 36.7 63.6    Oxygen Saturation Summary   Wake NREM REM TST TIB  Average SpO2 (%) 93.4% 90.1% 87.9% 89.6% 90.9%  Minimum SpO2 (%) 82.0% 86.0% 76.0% 76.0% 76.0%  Maximum SpO2 (%) 97.0% 97.0% 96.0% 97.0% 97.0%   Oxygen Saturation Distribution  Range (%) Time in range (min) Time in range (%)  90.0 - 100.0 224.3 51.6%  80.0 - 90.0 208.6 48.0%  70.0 - 80.0 1.6 0.4%  60.0 - 70.0 - -  50.0 - 60.0 - -  0.0 - 50.0 - -  Time Spent <=88% SpO2  Range (%) Time in range (min) Time in range (%)  0.0 - 88.0 70.8 16.3%      Count Index  Desaturations 123 25.6    Cardiac Summary   Wake NREM REM Sleep Total   Average Pulse Rate (BPM) 57.5 55.7 59.5 56.5 56.8  Minimum Pulse Rate (BPM) 49.0 49.0 50.0 49.0 49.0  Maximum Pulse Rate (BPM) 74.0 67.0 91.0 91.0 91.0   Pulse Rate Distribution:  Range (bpm) Time in range (min) Time in range (%)  0.0 - 40.0 - -  40.0 - 60.0 387.7 89.2%  60.0 - 80.0 44.4 10.2%  80.0 - 100.0 0.1 0.0%  100.0 - 120.0 - -  120.0 - 140.0 - -  140.0 - 200.0 - -      Hypnograms                      Technologist Comments  THE 22-YEAR-OLD MALE PATIENT PRESENTED TO THE SLEEP DISORDER CENTER FOR A NPSG DIAGNOSTIC STUDY WITH A CHIEF COMPLAINT OF SLEEP DISORDERED BREATHING. ALL BEDTIME MEDICATIONS WERE SELF ADMINISTERED AT 2030. THE DIAGNOSTIC STUDY WAS EXPLAINED, THE LEAD PLACEMENT WAS INITIATED, THEN THE STUDY WAS BEGUN. THE PATIENT SLEPT IN A RECLINER THROUGHOUT THE STUDY DUE TO BACK DIFFICULTIES. MODERATE AUDIBLE SNORING WAS NOTED THROUGHOUT THE STUDY. NO OBVIOUS PARASOMNIAS WERE OBSERVED. SUPPLEMENTAL OXYGEN WAS NOT WARRANTED DURING THE STUDY. NO PLMs - PLMAs WERE NOTED. ONE RESTROOM VISIT WAS MADE. SINUS BRADYCARDIA WITH FREQUENT QUESTIONABLE CARDIAC ARRHYTHMIAS WERE OBSERVED THROUGHOUT THE STUDY. PLEASE SEE ATTACHED SHEETS. THE PATIENT TOLERATED THE NPSG STUDY WELL.                            Reggy Salt Diplomate, Biomedical Engineer of Sleep Medicine  ELECTRONICALLY SIGNED ON:  06/02/2024, 1:37 PM  S[facility] CENTER PH: [phone]   FX: [phone] ACCREDITED BY THE AMERICAN ACADEMY OF SLEEP MEDICINE

## 2024-06-02 NOTE — Procedures (Signed)
                             Reggy Salt Diplomate, Biomedical Engineer of Sleep Medicine  ELECTRONICALLY SIGNED ON:  06/02/2024, 11:35 AM [place] [facility] PH: [phone]   FX: [phone] ACCREDITED BY THE AMERICAN ACADEMY OF SLEEP MEDICINE

## 2024-06-23 NOTE — Progress Notes (Signed)
 Established Patient Pulmonology Office Visit   Subjective:  Patient ID: Vincent Ramos, male    DOB: [birth year]  MRN: [id number]  CC: No chief complaint on file.   HPI  Vincent Ramos is a 73 y.o. male with HTN, HLD, DM, CAD who presents for follow up.  Last seen by me on 04/02/24, did initial work up for dyspnea including CBC, D dimer, PFTs, CXR. I switched him from Trelegy to Symbicort  2 puffs BID given hx c/w asthma. I advised him to increase diuretic to Torsemide  40 mg daily and I ordered sleep testing. CXR showed a lingular infiltrate and so I ordered him levofloxacin  for 5 days and will repeat imaging soon.    {PULM QUESTIONNAIRES (Optional):33196}  ROS  {History (Optional):23778}  Current Outpatient Medications:    albuterol  (VENTOLIN  HFA) 108 (90 Base) MCG/ACT inhaler, Inhale 2 puffs into the lungs every 6 (six) hours as needed for wheezing or shortness of breath., Disp: , Rfl:    atorvastatin  (LIPITOR) 40 MG tablet, Take 1 tablet (40 mg total) by mouth daily. (Patient not taking: Reported on 04/02/2024), Disp: 30 tablet, Rfl: 0   budesonide -formoterol  (SYMBICORT ) 160-4.5 MCG/ACT inhaler, Inhale 2 puffs into the lungs in the morning and at bedtime., Disp: 1 each, Rfl: 6   clopidogrel  (PLAVIX ) 75 MG tablet, Take 1 tablet (75 mg total) by mouth daily., Disp: 90 tablet, Rfl: 3   gabapentin  (NEURONTIN ) 300 MG capsule, Take 300-600 mg by mouth daily., Disp: , Rfl:    glipiZIDE  (GLUCOTROL ) 10 MG tablet, Take 10 mg by mouth 2 (two) times daily before a meal., Disp: , Rfl:    hydrALAZINE  (APRESOLINE ) 100 MG tablet, Take 1 tablet (100 mg total) by mouth 2 (two) times daily., Disp: 180 tablet, Rfl: 3   isosorbide  mononitrate (IMDUR ) 60 MG 24 hr tablet, Take 1 tablet (60 mg total) by mouth daily., Disp: 90 tablet, Rfl: 3   montelukast  (SINGULAIR ) 10 MG tablet, Take 10 mg by mouth at bedtime., Disp: , Rfl:    Nebivolol  HCl 20 MG TABS, Take 20 mg by mouth in the morning and at bedtime.,  Disp: , Rfl:    nitroGLYCERIN  (NITROSTAT ) 0.4 MG SL tablet, Place 1 tablet (0.4 mg total) under the tongue every 5 (five) minutes as needed., Disp: 25 tablet, Rfl: 3   olmesartan -hydrochlorothiazide  (BENICAR  HCT) 40-25 MG tablet, Take 1 tablet by mouth daily., Disp: , Rfl:    OZEMPIC, 0.25 OR 0.5 MG/DOSE, 2 MG/3ML SOPN, Inject 0.25 mg into the skin every Saturday. For 4 weeks.Then increase to 0.5 mg weekly., Disp: , Rfl:    Potassium Chloride  ER 20 MEQ TBCR, Take 1 tablet by mouth daily., Disp: , Rfl:    rosuvastatin  (CRESTOR ) 40 MG tablet, Take 40 mg by mouth daily., Disp: , Rfl:    tamsulosin  (FLOMAX ) 0.4 MG CAPS capsule, Take 0.4 mg by mouth daily., Disp: , Rfl:    torsemide  (DEMADEX ) 20 MG tablet, TAKE 1 TABLET BY MOUTH EVERY DAY. MAY TAKE AN ADDITIONAL TABLET AS NEEDED, Disp: 135 tablet, Rfl: 2   TRESIBA  FLEXTOUCH 100 UNIT/ML FlexTouch Pen, INJECT 50 UNITS UNDER THE SKIN EVERY NIGHT AT BEDTIME AND 10 UNITS EVERY MORNING, Disp: , Rfl:       Objective:  There were no vitals taken for this visit. {Pulm Vitals (Optional):32837}  Physical Exam   Diagnostic Review:  {Labs (Optional):32838}  D dimer 0.43 (NL)  Echocardiogram 08/2023: severe LVH with EF 55-60%   PFTS 12/2019 independently reviewed by  me show evidence of a moderately severe restrictive defect. Flow volume loop with decreased flow rates at end expiration suggestive of occult/borderline obstructive defect.   CT coronaries 5/25 reviewed independently and shows evidence of elevated right hemidiaphragm with atelectasis of RLL. This was present since CTA chest 12/2016. There's minimal emphysema in upper lobes.  CXR 04/02/24: IMPRESSION: Stable cardiomegaly with central pulmonary vascular congestion. Left lingular opacity is noted suggesting atelectasis or infiltrate. Followup PA and lateral chest X-ray is recommended in 3-4 weeks following trial of antibiotic therapy to ensure resolution and exclude underlying malignancy.  PSG  05/27/2024: Comments: Patient slept in recliner due to back difficulties. Moderate obstructive sleep apnea, AHI (4%) 16.2/hr. Snoring with oxygen desaturation toa nadir of 76%, mean 90.9%.   Diagnosis: Obstructive sleep apnea   Recommendations: Suggest autopap oe CPAP titration sleep study.    Assessment & Plan:   Assessment & Plan Dyspnea on exertion  Mild persistent asthma without complication  Abnormal chest x-ray  Obstructive sleep apnea   No orders of the defined types were placed in this encounter.     No follow-ups on file.   Jianni Shelden, MD

## 2024-06-24 ENCOUNTER — Ambulatory Visit: Admitting: Pulmonary Disease

## 2024-06-24 ENCOUNTER — Encounter: Payer: Self-pay | Admitting: Pulmonary Disease

## 2024-06-24 VITALS — BP 138/55 | HR 67 | Ht 66.5 in | Wt 265.4 lb

## 2024-06-24 DIAGNOSIS — J4530 Mild persistent asthma, uncomplicated: Secondary | ICD-10-CM

## 2024-06-24 DIAGNOSIS — I25118 Atherosclerotic heart disease of native coronary artery with other forms of angina pectoris: Secondary | ICD-10-CM

## 2024-06-24 DIAGNOSIS — R9389 Abnormal findings on diagnostic imaging of other specified body structures: Secondary | ICD-10-CM

## 2024-06-24 DIAGNOSIS — R0609 Other forms of dyspnea: Secondary | ICD-10-CM | POA: Diagnosis not present

## 2024-06-24 DIAGNOSIS — I5032 Chronic diastolic (congestive) heart failure: Secondary | ICD-10-CM

## 2024-06-24 DIAGNOSIS — G4730 Sleep apnea, unspecified: Secondary | ICD-10-CM

## 2024-06-24 DIAGNOSIS — G4733 Obstructive sleep apnea (adult) (pediatric): Secondary | ICD-10-CM | POA: Diagnosis not present

## 2024-06-24 DIAGNOSIS — Z6841 Body Mass Index (BMI) 40.0 and over, adult: Secondary | ICD-10-CM

## 2024-06-24 DIAGNOSIS — R918 Other nonspecific abnormal finding of lung field: Secondary | ICD-10-CM

## 2024-06-24 NOTE — Patient Instructions (Signed)
 VISIT SUMMARY: You visited us  today to address your shortness of breath, sleep apnea, and other related health concerns. We discussed your coronary artery disease, asthma, and recent sleep study results. We have made several plans to help manage your conditions and improve your overall health.  YOUR PLAN: MODERATE OBSTRUCTIVE SLEEP APNEA: You have moderate sleep apnea confirmed by a sleep study. You previously tried a CPAP machine but found it uncomfortable. -We have ordered a CPAP machine with a nasal mask to help improve your sleep quality and reduce the risk of heart attack.  CORONARY ARTERY DISEASE WITH SEVERE STENOSIS AND TOTAL OCCLUSION: You have severe stenosis and total occlusion in your coronary arteries, which is causing intermittent shortness of breath. -We have contacted your cardiologist to discuss the findings and the need for further intervention. -I sent a message to your heart doctor to set you up with appointment to discuss result of CT coronaries  CHRONIC DIASTOLIC HEART FAILURE: This condition is contributing to your shortness of breath. -We will continue to monitor your symptoms and manage your condition.  MILD PERSISTENT ASTHMA: You have mild persistent asthma, which is managed with your Symbicort  inhaler. -Continue using your Symbicort  inhaler as prescribed.  MORBID OBESITY DUE TO EXCESS CALORIES: Your weight is contributing to your sleep apnea and possibly your heart failure. -We discussed the benefits of weight loss in improving your overall health and reducing your symptoms.  ABNORMAL CHEST IMAGING, POSSIBLE PNEUMONIA: A previous chest x-ray showed a spot that suggested pneumonia. You were treated with antibiotics, which helped slightly. -We have ordered a chest CT to check if the pneumonia has resolved.  Contains text generated by [employer].

## 2024-07-01 ENCOUNTER — Ambulatory Visit (HOSPITAL_COMMUNITY)
Admission: RE | Admit: 2024-07-01 | Discharge: 2024-07-01 | Disposition: A | Source: Ambulatory Visit | Attending: Pulmonary Disease | Admitting: Pulmonary Disease

## 2024-07-01 DIAGNOSIS — R9389 Abnormal findings on diagnostic imaging of other specified body structures: Secondary | ICD-10-CM | POA: Diagnosis present

## 2024-07-08 ENCOUNTER — Telehealth: Payer: Self-pay | Admitting: Cardiology

## 2024-07-08 NOTE — Telephone Encounter (Signed)
 Pt came to office today saying his lung doctor notified him of results from some past testing that concerned him Made f/u visit in January Wanted to speak to someone about results

## 2024-07-09 ENCOUNTER — Ambulatory Visit: Payer: Self-pay | Admitting: Pulmonary Disease

## 2024-07-10 NOTE — Progress Notes (Signed)
 Called and relayed results pt confirmed understanding - pt seeing cardio this week

## 2024-07-30 ENCOUNTER — Telehealth (HOSPITAL_BASED_OUTPATIENT_CLINIC_OR_DEPARTMENT_OTHER): Payer: Self-pay

## 2024-07-30 NOTE — Telephone Encounter (Signed)
 CMN received for CPAP supplies sent to [facility] signed by provider and faxed confirmation received

## 2024-08-16 ENCOUNTER — Ambulatory Visit: Attending: Cardiology | Admitting: Cardiology

## 2024-08-16 ENCOUNTER — Encounter: Payer: Self-pay | Admitting: Cardiology

## 2024-08-16 VITALS — BP 156/72 | HR 68 | Resp 16 | Ht 66.0 in | Wt 266.6 lb

## 2024-08-16 DIAGNOSIS — I5032 Chronic diastolic (congestive) heart failure: Secondary | ICD-10-CM | POA: Diagnosis not present

## 2024-08-16 DIAGNOSIS — G4730 Sleep apnea, unspecified: Secondary | ICD-10-CM | POA: Diagnosis not present

## 2024-08-16 DIAGNOSIS — E119 Type 2 diabetes mellitus without complications: Secondary | ICD-10-CM | POA: Diagnosis not present

## 2024-08-16 DIAGNOSIS — F1490 Cocaine use, unspecified, uncomplicated: Secondary | ICD-10-CM | POA: Diagnosis not present

## 2024-08-16 DIAGNOSIS — I10 Essential (primary) hypertension: Secondary | ICD-10-CM

## 2024-08-16 DIAGNOSIS — I25118 Atherosclerotic heart disease of native coronary artery with other forms of angina pectoris: Secondary | ICD-10-CM

## 2024-08-16 MED ORDER — EZETIMIBE 10 MG PO TABS: 10.0000 mg | ORAL_TABLET | Freq: Every day | ORAL | 3 refills | Status: AC

## 2024-08-16 NOTE — Progress Notes (Signed)
 " Cardiology Office Note:  .    ID:  Vincent Ramos, DOB May 21, 1951, MRN [id number] PCP:  Vincent Jarvis, NP  Former Cardiology Providers: Dr. Delford and Dr. [facility] Providers Cardiologist:  Vincent Large, DO , [facility] (established care 11/29/23) Electrophysiologist:  None  Click to update primary MD,subspecialty MD or APP then REFRESH:1}    Chief Complaint  Patient presents with   Chronic heart failure with preserved ejection fraction   Follow-up    History of Present Illness: .   Vincent Ramos is a 74 y.o. African-American male whose past medical history and cardiovascular risk factors includes: Cocaine use, recent TIA / stroke treated with TNK , OSA, COPD HFpEF, hypertension, COPD, hyperlipidemia, diabetes mellitus type 2, nonobstructive CAD status post left heart, obesity due to excess calories.  Formally under the care of Dr. Candyce Vincent and I started seeing him in April 2025.   In January 2025 patient presented to [facility] due to acute onset of double vision, difficulty walking, slurred speech.  He was treated with TNK at [facility] and transferred to [facility] for post TNK and stroke workup.  Working diagnosis with acute ischemic infarct involving the posterior circulation not seen on MRI status post TNK.  MRI was negative for stroke.  Echocardiogram noted preserved LVEF, LDL 75 mg/dL, urine drug screen positive for cocaine.  During last visit in May 2025 patient was endorsing chest pain concerning for possible cardiac etiology.  Plan was to proceed forward with coronary CTA.  He was also complaining of shortness of breath specifically when laying flat and prefers sleeping in a lounge chair.  He was requiring 3 pillows to sleep at night.  He does have sleep apnea but was not tolerating his CPAP.  I increased his hydralazine  and Imdur  for better blood pressure management.    Since last office visit Vincent Ramos denies any anginal chest pain or heart  failure symptoms.   However he does have shortness of breath with effort related activities which is chronic and stable No hospitalizations or urgent care visits for cardiovascular reasons.   He has been compliant with his medical therapy and endorses no concerns.  Patient endorses that he is worried about a 70% blockage that was reported on prior studies and he is unclear of its significance.  Patient does not recall which test illustrated this finding.  The last cardiac testing I had recommended was a coronary CTA back in May 2025.  Patient states that he has not had any other cardiovascular testing at a different institution.  Patient states that he was told this at his last pulmonary appointment.  I secure chatted his pulmonary attending via epic who reviewed his chart and provides clarity of this was likely a miscommunication and he agrees that based on prior results he has nonobstructive disease.  I reviewed the coronary CT results including the report with the patient to provide him ease and reassurance.  Patient does not check his blood pressures at home. States that his sugars and cholesterol needs to be better controlled.  Review of Systems: .   Review of Systems  Cardiovascular:  Positive for dyspnea on exertion (Chronic and stable). Negative for chest pain, claudication, irregular heartbeat, leg swelling, near-syncope, orthopnea, palpitations, paroxysmal nocturnal dyspnea and syncope.  Hematologic/Lymphatic: Negative for bleeding problem.    Studies Reviewed:   EKG: EKG Interpretation Date/Time:  Friday August 16 2024 10:06:55 EST Ventricular Rate:  72 PR Interval:  206  QRS Duration:  82 QT Interval:  414 QTC Calculation: 453 R Axis:   70  Text Interpretation: Sinus rhythm with Premature atrial complexes Cannot rule out Anterior infarct (cited on or before 29-Nov-2023) T wave abnormality, consider inferior ischemia When compared with ECG of 29-Nov-2023 08:46, Premature atrial  complexes are now Present T wave abnormality more notable in lead III and aVF Confirmed by Vincent Ramos [phone]) on 08/16/2024 10:38:03 AM  Echocardiogram: 09/2022: LVEF 70 to 75%, LVH severe, grade 2 diastolic dysfunction, right ventricular size is mildly enlarged, right ventricular function normal, biatrial moderate dilatation estimated RAP 8 mmHg.  01/02025 LVEF 55 to 60%, severe LVH, right ventricular size and function normal, aortic root 38 mm, no significant valvular heart disease  Stress Testing: Myocardial perfusion imaging: 03/2023: Low restudy  Left and right heart catheterization: 11/2017 Widely patent coronary arteries with normal left main, 30% mid LAD, luminal irregularities in the proximal and mid circumflex, and 30 to 40% stenosis in the mid RCA. Mild global left ventricular systolic dysfunction with EF 40 to 45%.  Severely elevated end-diastolic pressure consistent with chronic combined systolic and diastolic heart failure.  Severe poorly controlled blood pressure during the procedure. Severe BP elevation during the procedure requiring IV hydralazine , labetalol , and IV nitroglycerin  drip. Stable right femoral after Mynx arteriotomy closure device.   RECOMMENDATIONS:   IV Lasix  x1 and more aggressive outpatient diuresis Guideline directed therapy for systolic/diastolic heart failure.   Coronary CTA  Dec 18, 2023: 1. Coronary calcium  score of 27.3. This was 48th percentile for age-, sex, and race-matched controls.  2. Total plaque volume 98 mm3 which is 12th percentile for age- and sex-matched controls (calcified plaque 4 mm3; non-calcified plaque 94 mm3). TPV is mild.  3. Normal coronary origin with right dominance.  4. There is minimal (<25%) plaque in the RCA, LAD and LCX. CAD-RADS 1.  5. Radiology overread: No acute abnormality   RADIOLOGY: NA  Risk Assessment/Calculations:   NA   Labs:       Latest Ref Rng & Units 04/02/2024   11:12 AM 08/06/2023    4:34  AM 08/04/2023    7:51 PM  CBC  WBC 3.4 - 10.8 x10E3/uL 7.1  5.9    Hemoglobin 13.0 - 17.7 g/dL 85.2  85.3  83.9   Hematocrit 37.5 - 51.0 % 46.8  45.4  47.0   Platelets 150 - 450 x10E3/uL 137  109         Latest Ref Rng & Units 04/02/2024   11:12 AM 11/29/2023   10:10 AM 08/06/2023    4:34 AM  BMP  Glucose 70 - 99 mg/dL 98  852  704   BUN 8 - 27 mg/dL 12  13  14    Creatinine 0.76 - 1.27 mg/dL 8.99  8.94  8.82   BUN/Creat Ratio 10 - 24 12  12     Sodium 134 - 144 mmol/L 140  143  134   Potassium 3.5 - 5.2 mmol/L 4.2  4.1  3.6   Chloride 96 - 106 mmol/L 100  103  97   CO2 20 - 29 mmol/L 26  26  27    Calcium  8.6 - 10.2 mg/dL 9.2  9.5  8.9       Latest Ref Rng & Units 04/02/2024   11:12 AM 11/29/2023   10:10 AM 08/06/2023    4:34 AM  CMP  Glucose 70 - 99 mg/dL 98  852  704   BUN 8 - 27 mg/dL  12  13  14    Creatinine 0.76 - 1.27 mg/dL 8.99  8.94  8.82   Sodium 134 - 144 mmol/L 140  143  134   Potassium 3.5 - 5.2 mmol/L 4.2  4.1  3.6   Chloride 96 - 106 mmol/L 100  103  97   CO2 20 - 29 mmol/L 26  26  27    Calcium  8.6 - 10.2 mg/dL 9.2  9.5  8.9     Lab Results  Component Value Date   CHOL 133 08/06/2023   HDL 37 (L) 08/06/2023   LDLCALC 75 08/06/2023   TRIG 105 08/06/2023   CHOLHDL 3.6 08/06/2023   No results for input(s): LIPOA in the last 8760 hours. No components found for: NTPROBNP Recent Labs    11/29/23 1010  PROBNP 112   No results for input(s): TSH in the last 8760 hours.  Physical Exam:    Today's Vitals   08/16/24 1004 08/16/24 1032  BP: (!) 165/65 (!) 156/72  Pulse: 68   Resp: 16   SpO2: 94%   Weight: 266 lb 9.6 oz (120.9 kg)   Height: 5' 6 (1.676 m)    Body mass index is 43.03 kg/m. Wt Readings from Last 3 Encounters:  08/16/24 266 lb 9.6 oz (120.9 kg)  06/24/24 265 lb 6.4 oz (120.4 kg)  05/27/24 268 lb (121.6 kg)    Physical Exam  Constitutional: No distress.  hemodynamically stable  Neck: No JVD present.  Cardiovascular: Normal rate,  regular rhythm, S1 normal and S2 normal. Exam reveals no gallop, no S3 and no S4.  No murmur heard. Pulmonary/Chest: Effort normal and breath sounds normal. No stridor. He has no wheezes. He has no rales.  Musculoskeletal:        General: No edema.     Cervical back: Neck supple.  Skin: Skin is warm.     Impression & Recommendation(s):  Impression:   ICD-10-CM   1. Chronic heart failure with preserved ejection fraction (HCC)  I50.32 EKG 12-Lead    2. Coronary artery disease involving native coronary artery of native heart with other form of angina pectoris  I25.118 EKG 12-Lead    ezetimibe  (ZETIA ) 10 MG tablet    Lipid Profile    Comp Met (CMET)    3. Essential hypertension  I10     4. Type 2 diabetes mellitus without complication, without long-term current use of insulin  (HCC)  E11.9 ezetimibe  (ZETIA ) 10 MG tablet    5. Cocaine use  F14.90     6. Sleep apnea in adult  G47.30        Recommendation(s):  Chronic heart failure with preserved ejection fraction (HCC) NYHA class II/III. Continue olmesartan /hydrochlorothiazide  40/25 mg p.o. daily. Continue torsemide  20 mg p.o. daily. Increase hydralazine  100 mg p.o. daily to twice daily dosing to 3 times daily Continue Imdur  60 mg p.o. daily  Continue Nebivolol  20 mg p.o. every morning  Continue torsemide  20 mg p.o. daily Reemphasized importance of low-salt diet. Better blood pressure management Strict I's and O's, daily weights. Recently followed up with pulmonary medicine and plans to start auto CPAP, reemphasized its importance  Coronary artery disease involving native coronary artery of native heart with other form of angina pectoris [facility]) CCTA May 2025: Total CAC 27.3, 40th percentile.  Total plaque volume 98 mm3, and minimal nonobstructive disease No additional cardiovascular testing is warranted at this time. Reemphasize importance of improving modifiable cardiovascular risk factors  Essential hypertension Office  blood pressures are  not well-controlled. Home blood pressures are not often checked Patient is emphasized on importance of checking his blood pressures regularly so that medical therapy can be uptitrated. Patient understands that uncontrolled hypertension can lead to morbidity such as strokes, cardiovascular comorbidities such as heart failure and heart attack, and chronic kidney disease Will increase hydralazine  from 100 mg p.o. twice daily to 3 times daily  Type 2 diabetes mellitus without complication, without long-term current use of insulin  (HCC) Reemphasized importance of glycemic control. Last hemoglobin A1c 8.7 as of January 2025. Last LDL 75 mg/dL as of January 2025 Recommended that he have his labs updated with PCP. Patient states that he sees Daphne Lesches at [facility] Will add Zetia  10 mg p.o. daily to target a goal LDL at least <70 mg/dL.  Fasting lipids and CMP in 6 weeks  Cocaine use Reemphasized importance of complete cessation.  Orders Placed:  Orders Placed This Encounter  Procedures   Lipid Profile   Comp Met (CMET)   EKG 12-Lead     Final Medication List:    Meds ordered this encounter  Medications   ezetimibe  (ZETIA ) 10 MG tablet    Sig: Take 1 tablet (10 mg total) by mouth daily.    Dispense:  90 tablet    Refill:  3    There are no discontinued medications.    Current Outpatient Medications:    albuterol  (VENTOLIN  HFA) 108 (90 Base) MCG/ACT inhaler, Inhale 2 puffs into the lungs every 6 (six) hours as needed for wheezing or shortness of breath., Disp: , Rfl:    atorvastatin  (LIPITOR) 40 MG tablet, Take 1 tablet (40 mg total) by mouth daily., Disp: 30 tablet, Rfl: 0   budesonide -formoterol  (SYMBICORT ) 160-4.5 MCG/ACT inhaler, Inhale 2 puffs into the lungs in the morning and at bedtime., Disp: 1 each, Rfl: 6   clopidogrel  (PLAVIX ) 75 MG tablet, Take 1 tablet (75 mg total) by mouth daily., Disp: 90 tablet, Rfl: 3   ezetimibe  (ZETIA ) 10 MG tablet,  Take 1 tablet (10 mg total) by mouth daily., Disp: 90 tablet, Rfl: 3   gabapentin  (NEURONTIN ) 300 MG capsule, Take 300-600 mg by mouth daily., Disp: , Rfl:    glipiZIDE  (GLUCOTROL ) 10 MG tablet, Take 10 mg by mouth 2 (two) times daily before a meal., Disp: , Rfl:    hydrALAZINE  (APRESOLINE ) 100 MG tablet, Take 1 tablet (100 mg total) by mouth 2 (two) times daily. (Patient taking differently: Take 100 mg by mouth 3 (three) times daily.), Disp: 180 tablet, Rfl: 3   isosorbide  mononitrate (IMDUR ) 60 MG 24 hr tablet, Take 1 tablet (60 mg total) by mouth daily., Disp: 90 tablet, Rfl: 3   montelukast  (SINGULAIR ) 10 MG tablet, Take 10 mg by mouth at bedtime., Disp: , Rfl:    Nebivolol  HCl 20 MG TABS, Take 20 mg by mouth in the morning and at bedtime., Disp: , Rfl:    nitroGLYCERIN  (NITROSTAT ) 0.4 MG SL tablet, Place 1 tablet (0.4 mg total) under the tongue every 5 (five) minutes as needed., Disp: 25 tablet, Rfl: 3   olmesartan -hydrochlorothiazide  (BENICAR  HCT) 40-25 MG tablet, Take 1 tablet by mouth daily., Disp: , Rfl:    OZEMPIC, 0.25 OR 0.5 MG/DOSE, 2 MG/3ML SOPN, Inject 0.25 mg into the skin every Saturday. For 4 weeks.Then increase to 0.5 mg weekly., Disp: , Rfl:    Potassium Chloride  ER 20 MEQ TBCR, Take 1 tablet by mouth daily., Disp: , Rfl:    rosuvastatin  (CRESTOR ) 40 MG tablet,  Take 40 mg by mouth daily., Disp: , Rfl:    tamsulosin  (FLOMAX ) 0.4 MG CAPS capsule, Take 0.4 mg by mouth daily., Disp: , Rfl:    torsemide  (DEMADEX ) 20 MG tablet, TAKE 1 TABLET BY MOUTH EVERY DAY. MAY TAKE AN ADDITIONAL TABLET AS NEEDED, Disp: 135 tablet, Rfl: 2   TRESIBA  FLEXTOUCH 100 UNIT/ML FlexTouch Pen, INJECT 50 UNITS UNDER THE SKIN EVERY NIGHT AT BEDTIME AND 10 UNITS EVERY MORNING, Disp: , Rfl:   Consent:   NA  Disposition:   6 month sooner if needed.  His questions and concerns were addressed to his satisfaction. He voices understanding of the recommendations provided during this encounter.     Signed, Vincent Vincent HAS, [facility]  A Division of [facility] [address] [place], [place] [address]   "

## 2024-08-16 NOTE — Patient Instructions (Signed)
 Medication Instructions:  START Ezetimibe  (Zetia ) 10 mg. Take one (1) tablet by mouth once daily in the morning.   INCREASE Hydralazine  (Apresoline ) 100 mg. Take one (1) tablet by mouth THREE TIMES DAILY. *If you need a refill on your cardiac medications before your next appointment, please call your pharmacy*  Lab Work: Fasting Lipid and CMP in 6 weeks If you have labs (blood work) drawn today and your tests are completely normal, you will receive your results only by: MyChart Message (if you have MyChart) OR A paper copy in the mail If you have any lab test that is abnormal or we need to change your treatment, we will call you to review the results.  Testing/Procedures: None ordered  Follow-Up: At [facility], you and your health needs are our priority.  As part of our continuing mission to provide you with exceptional heart care, our providers are all part of one team.  This team includes your primary Cardiologist (physician) and Advanced Practice Providers or APPs (Physician Assistants and Nurse Practitioners) who all work together to provide you with the care you need, when you need it.  Your next appointment:   6 month(s)  Provider:   Madonna Large, DO    We recommend signing up for the patient portal called MyChart.  Sign up information is provided on this After Visit Summary.  MyChart is used to connect with patients for Virtual Visits (Telemedicine).  Patients are able to view lab/test results, encounter notes, upcoming appointments, etc.  Non-urgent messages can be sent to your provider as well.   To learn more about what you can do with MyChart, go to [email or web address].
